# Patient Record
Sex: Male | Born: 1935
Health system: Southern US, Community
[De-identification: ages and names within clinical notes are randomized; demographics above are authoritative.]

## PROBLEM LIST (undated history)

## (undated) ENCOUNTER — Emergency Department (HOSPITAL_COMMUNITY): Admission: EM | Payer: Medicare Other | Source: Home / Self Care

## (undated) DIAGNOSIS — D62 Acute posthemorrhagic anemia: Secondary | ICD-10-CM

## (undated) DIAGNOSIS — Z9289 Personal history of other medical treatment: Secondary | ICD-10-CM

## (undated) DIAGNOSIS — I739 Peripheral vascular disease, unspecified: Secondary | ICD-10-CM

## (undated) DIAGNOSIS — N189 Chronic kidney disease, unspecified: Secondary | ICD-10-CM

## (undated) DIAGNOSIS — I4891 Unspecified atrial fibrillation: Secondary | ICD-10-CM

## (undated) DIAGNOSIS — D638 Anemia in other chronic diseases classified elsewhere: Secondary | ICD-10-CM

## (undated) DIAGNOSIS — I10 Essential (primary) hypertension: Secondary | ICD-10-CM

## (undated) DIAGNOSIS — M8660 Other chronic osteomyelitis, unspecified site: Secondary | ICD-10-CM

## (undated) DIAGNOSIS — M109 Gout, unspecified: Secondary | ICD-10-CM

## (undated) HISTORY — DX: Unspecified atrial fibrillation: I48.91

## (undated) HISTORY — DX: Peripheral vascular disease, unspecified: I73.9

## (undated) HISTORY — DX: Other chronic osteomyelitis, unspecified site: M86.60

## (undated) HISTORY — PX: EYE SURGERY: SHX253

---

## 2013-08-05 ENCOUNTER — Emergency Department (HOSPITAL_COMMUNITY): Payer: Medicare Other

## 2013-08-05 ENCOUNTER — Encounter (HOSPITAL_COMMUNITY): Payer: Self-pay

## 2013-08-05 ENCOUNTER — Inpatient Hospital Stay (HOSPITAL_COMMUNITY)
Admission: EM | Admit: 2013-08-05 | Discharge: 2013-08-11 | DRG: 553 | Disposition: A | Discharge status: Skilled Nursing Facility | Payer: Medicare Other | Attending: Internal Medicine | Admitting: Internal Medicine

## 2013-08-05 DIAGNOSIS — M109 Gout, unspecified: Principal | ICD-10-CM | POA: Diagnosis present

## 2013-08-05 DIAGNOSIS — M25432 Effusion, left wrist: Secondary | ICD-10-CM | POA: Diagnosis present

## 2013-08-05 DIAGNOSIS — I10 Essential (primary) hypertension: Secondary | ICD-10-CM | POA: Diagnosis present

## 2013-08-05 DIAGNOSIS — E43 Unspecified severe protein-calorie malnutrition: Secondary | ICD-10-CM | POA: Diagnosis present

## 2013-08-05 DIAGNOSIS — D72829 Elevated white blood cell count, unspecified: Secondary | ICD-10-CM | POA: Diagnosis present

## 2013-08-05 DIAGNOSIS — N179 Acute kidney failure, unspecified: Secondary | ICD-10-CM | POA: Diagnosis present

## 2013-08-05 DIAGNOSIS — M25439 Effusion, unspecified wrist: Secondary | ICD-10-CM

## 2013-08-05 DIAGNOSIS — E876 Hypokalemia: Secondary | ICD-10-CM | POA: Diagnosis not present

## 2013-08-05 DIAGNOSIS — A419 Sepsis, unspecified organism: Secondary | ICD-10-CM | POA: Diagnosis present

## 2013-08-05 HISTORY — DX: Essential (primary) hypertension: I10

## 2013-08-05 LAB — URINALYSIS, ROUTINE W REFLEX MICROSCOPIC
Leukocytes, UA: NEGATIVE
Nitrite: NEGATIVE
Specific Gravity, Urine: 1.023 (ref 1.005–1.030)
Urobilinogen, UA: 1 mg/dL (ref 0.0–1.0)
pH: 5 (ref 5.0–8.0)

## 2013-08-05 LAB — BASIC METABOLIC PANEL
BUN: 46 mg/dL — ABNORMAL HIGH (ref 6–23)
Chloride: 96 mEq/L (ref 96–112)
GFR calc Af Amer: 37 mL/min — ABNORMAL LOW (ref >90)
Potassium: 3.3 mEq/L — ABNORMAL LOW (ref 3.5–5.1)
Sodium: 134 mEq/L — ABNORMAL LOW (ref 135–145)

## 2013-08-05 LAB — CG4 I-STAT (LACTIC ACID): Lactic Acid, Venous: 2.67 mmol/L — ABNORMAL HIGH (ref 0.5–2.2)

## 2013-08-05 LAB — CBC WITH DIFFERENTIAL/PLATELET
Eosinophils Relative: 0 % (ref 0–5)
Lymphocytes Relative: 4 % — ABNORMAL LOW (ref 12–46)
Lymphs Abs: 0.9 10*3/uL (ref 0.7–4.0)
MCV: 84.3 fL (ref 78.0–100.0)
Neutro Abs: 19.3 10*3/uL — ABNORMAL HIGH (ref 1.7–7.7)
Platelets: 385 10*3/uL (ref 150–400)
RBC: 4.32 MIL/uL (ref 4.22–5.81)
WBC: 22.3 10*3/uL — ABNORMAL HIGH (ref 4.0–10.5)

## 2013-08-05 LAB — URINE MICROSCOPIC-ADD ON

## 2013-08-05 LAB — SEDIMENTATION RATE: Sed Rate: 120 mm/hr — ABNORMAL HIGH (ref 0–16)

## 2013-08-05 LAB — URIC ACID: Uric Acid, Serum: 10.6 mg/dL — ABNORMAL HIGH (ref 4.0–7.8)

## 2013-08-05 MED ORDER — SODIUM CHLORIDE 0.9 % IJ SOLN
3.0000 mL | Freq: Two times a day (BID) | INTRAMUSCULAR | Status: DC
  Administered 2013-08-07: 23:00:00 3 mL via INTRAVENOUS

## 2013-08-05 MED ORDER — VANCOMYCIN HCL IN DEXTROSE 1-5 GM/200ML-% IV SOLN
1000.0000 mg | INTRAVENOUS | Status: DC
  Administered 2013-08-05: 21:00:00 1000 mg via INTRAVENOUS
  Filled 2013-08-05: qty 200

## 2013-08-05 MED ORDER — HEPARIN SODIUM (PORCINE) 5000 UNIT/ML IJ SOLN
5000.0000 [IU] | Freq: Three times a day (TID) | INTRAMUSCULAR | Status: DC
  Filled 2013-08-05 (×2): qty 1

## 2013-08-05 MED ORDER — SODIUM CHLORIDE 0.9 % IV SOLN
INTRAVENOUS | Status: DC
  Administered 2013-08-05: 17:00:00 via INTRAVENOUS

## 2013-08-05 MED ORDER — POTASSIUM CHLORIDE CRYS ER 20 MEQ PO TBCR
30.0000 meq | EXTENDED_RELEASE_TABLET | Freq: Once | ORAL | Status: AC
  Administered 2013-08-05: 21:00:00 30 meq via ORAL
  Filled 2013-08-05: qty 1

## 2013-08-05 MED ORDER — SODIUM CHLORIDE 0.9 % IV SOLN
INTRAVENOUS | Status: DC
  Administered 2013-08-06 – 2013-08-09 (×8): via INTRAVENOUS
  Administered 2013-08-10: 30 mL/h via INTRAVENOUS

## 2013-08-05 NOTE — Progress Notes (Signed)
EDCM spoke to patient and family at bedside.  As per patient's family member, the patient's pcp is Dr. Zara Council. on [address].  Patient reports he was just in the doctor's office last week and had blood work done.  Offered support to patient and family.  No further needs at this time.

## 2013-08-05 NOTE — ED Notes (Signed)
Bed: WA23 Expected date:  Expected time:  Means of arrival:  Comments: ems 

## 2013-08-05 NOTE — ED Provider Notes (Signed)
CSN: [id number]     Arrival date & time 08/05/13  1441 History   First MD Initiated Contact with Patient 08/05/13 [phone]     Chief Complaint  Patient presents with  . Wrist Pain   (Consider location/radiation/quality/duration/timing/severity/associated sxs/prior Treatment) Patient is a 79 y.o. male presenting with wrist pain. The history is provided by the patient. No language interpreter was used.  Wrist Pain This is a new problem. The current episode started in the past 7 days. The problem occurs constantly. The problem has been gradually improving. Associated symptoms include arthralgias and joint swelling. The symptoms are aggravated by exertion. He has tried nothing for the symptoms.  Patient noted pain and swelling in left wrist, onset one week ago.  Patient reports he is unable to use left hand to assist with standing from chair d/t pain.  Patient is left hand dominant. Patient normally very active at home.  Has been staying in the bed for the last week.  Reports intermittent fever.  Denies chest pain, shortness of breath, abdominal pain.  Mild lower back pain.  Ambulatory today.  No past medical history on file. No past surgical history on file. No family history on file. History  Substance Use Topics  . Smoking status: Not on file  . Smokeless tobacco: Not on file  . Alcohol Use: Not on file    Review of Systems  Musculoskeletal: Positive for back pain, joint swelling and arthralgias.  All other systems reviewed and are negative.    Allergies  Review of patient's allergies indicates no known allergies.  Home Medications   Current Outpatient Rx  Name  Route  Sig  Dispense  Refill  . aspirin 81 MG tablet   Oral   Take 81 mg by mouth daily.         . hydrochlorothiazide (HYDRODIURIL) 25 MG tablet   Oral   Take 25 mg by mouth daily.         Marland Kitchen lisinopril (PRINIVIL,ZESTRIL) 20 MG tablet   Oral   Take 20 mg by mouth 2 (two) times daily.         Marland Kitchen POTASSIUM PO  Oral   Take 1 tablet by mouth daily. OTC Potassium          BP 142/105  Pulse 77  Temp(Src) 99.5 F (37.5 C) (Oral)  Resp 23  SpO2 95% Physical Exam  Nursing note and vitals reviewed. Constitutional: He is oriented to person, place, and time. He appears well-developed.  HENT:  Head: Normocephalic and atraumatic.  Neck: Normal range of motion. Neck supple.  Cardiovascular: Intact distal pulses.   Pulmonary/Chest: Effort normal and breath sounds normal.  Abdominal: Soft. Bowel sounds are normal.  Musculoskeletal: He exhibits edema and tenderness.       Left wrist: He exhibits tenderness and swelling.  Lymphadenopathy:    He has no cervical adenopathy.  Neurological: He is alert and oriented to person, place, and time.  Skin: Skin is warm and dry.  Psychiatric: He has a normal mood and affect. His behavior is normal. Judgment and thought content normal.    ED Course  Procedures (including critical care time) Labs Review Labs Reviewed  CBC WITH DIFFERENTIAL  URIC ACID  SEDIMENTATION RATE  BASIC METABOLIC PANEL   Imaging Review No results found. Low grade fever, leukocytosis with increased neutrophils, renal insufficiency, tachycardia, elevated lactic acid in elderly patient. Normal chest xray, urine without indication of infection.  Question hand/wrist as infectious source. Admitted to hospitalist. MDM  Sepsis.    Jimmye Norman, NP 08/06/13 [phone]

## 2013-08-05 NOTE — Progress Notes (Signed)
Utilization Review completed.  Myleen Brailsford RN CM  

## 2013-08-05 NOTE — Progress Notes (Signed)
ANTIBIOTIC CONSULT NOTE - INITIAL  Pharmacy Consult for Vancomycin Indication: left wrist swelling  No Known Allergies  Patient Measurements: Height: 5\' 11"  (180.3 cm) Weight: 172 lb 9.9 oz (78.3 kg) IBW/kg (Calculated) : 75.3   Vital Signs: Temp: 99.2 F (37.3 C) (09/16 1904) Temp src: Oral (09/16 1904) BP: 145/80 mmHg (09/16 1904) Pulse Rate: 108 (09/16 1904) Intake/Output from previous day:   Intake/Output from this shift:    Labs:  Recent Labs  08/05/13 1535  WBC 22.3*  HGB 12.7*  PLT 385  CREATININE 1.84*   Estimated Creatinine Clearance: 30.7 ml/min (by C-G formula based on Cr of 1.84). No results found for this basename: VANCOTROUGH, VANCOPEAK, VANCORANDOM, GENTTROUGH, GENTPEAK, GENTRANDOM, TOBRATROUGH, TOBRAPEAK, TOBRARND, AMIKACINPEAK, AMIKACINTROU, AMIKACIN,  in the last 72 hours   Microbiology: No results found for this or any previous visit (from the past 720 hour(s)).  Medical History: Past Medical History  Diagnosis Date  . Hypertension      Assessment: Vincent Miranda presenting with left wrist pain with joint swelling.  Reports intermittent fever.  Beginning vancomycin for possible septic arthritis.  WBC 22.3  Current temperature 99.5  AKI with SCr 1.84, CrCl~29 ml/min/1.41m2 (normalized), CrCl~30 ml/min (CG)  Blood cultures sent.  Goal of Therapy:  Vancomycin trough level 15-20 mcg/ml  Plan:  Vancomycin 1g IV q24h. F/u SCr, trough levels, culture results.  Clance Boll 08/05/2013,7:08 PM

## 2013-08-05 NOTE — H&P (Signed)
[facility] History and Physical  Vincent Miranda ZOX:[id number] DOB: January 16, 1925 DOA: 08/05/2013  Referring physician: Dr. Karma Ganja PCP: No primary provider on file.  Specialists: ortho hand  Chief Complaint: left hand swelling  HPI: Vincent Miranda is a 79 y.o. male has a past medical history significant for HTN, presents to [facility] ED with a chief complaint of left wrist and hand swelling. He states that he has noticed the swelling for the past 1-2 days being the worst today. He has been working in the yard a lot over the weekend but does not recall trauma or injury to his wrist/hand. He is having difficulties flexing his fingers and forming a fist. He is having difficulties flexing his wrist. He endorses mild chills at home and is not sure about fever. Denies chest pain/SOB. He recalls having an episode like this years ago resolved on its own. Denies history of gout. No abdominal complaints. Feels generalized weakness and fatigue.   Review of Systems: as per HPI otherwise negative.   Past Medical History  Diagnosis Date  . Hypertension    History reviewed. No pertinent past surgical history. Social History:  reports that he has never smoked. He has never used smokeless tobacco. He reports that he does not drink alcohol or use illicit drugs.  No Known Allergies  History reviewed. No pertinent family history.  Prior to Admission medications   Medication Sig Start Date End Date Taking? Authorizing Provider  aspirin 81 MG tablet Take 81 mg by mouth daily.   Yes Historical Provider, MD  hydrochlorothiazide (HYDRODIURIL) 25 MG tablet Take 25 mg by mouth daily.   Yes Historical Provider, MD  lisinopril (PRINIVIL,ZESTRIL) 20 MG tablet Take 20 mg by mouth 2 (two) times daily.   Yes Historical Provider, MD  POTASSIUM PO Take 1 tablet by mouth daily. OTC Potassium   Yes Historical Provider, MD   Physical Exam: Filed Vitals:   08/05/13 1700 08/05/13 1730 08/05/13 1800 08/05/13 1830   BP: 108/71 118/71 86/51 118/63  Pulse: 115 112 109 110  Temp:      TempSrc:      Resp: 27 25 24 24   SpO2: 99% 99% 99% 98%     General:  NAD  Eyes: no scleral icterus  ENT: moist oropharynx  Neck: supple, no JVD  Cardiovascular: regular rate without MRG; 2+ peripheral pulses; tachycardic.  Respiratory: CTA biL, good air movement without wheezing, rhonchi or crackled  Abdomen: soft, non tender to palpation, positive bowel sounds, no guarding, no rebound  Skin: no rashes  Musculoskeletal: no peripheral edema; left hand/wrist swollen, erythematous. Decreased ROM due to severe pain. Swollen PIP 2.  Psychiatric: normal mood and affect  Neurologic: non focal  Labs on Admission:  Basic Metabolic Panel:  Recent Labs Lab 08/05/13 1535  NA 134*  K 3.3*  CL 96  CO2 26  GLUCOSE 127*  BUN 46*  CREATININE 1.84*  CALCIUM 11.2*   CBC:  Recent Labs Lab 08/05/13 1535  WBC 22.3*  NEUTROABS 19.3*  HGB 12.7*  HCT 36.4*  MCV 84.3  PLT 385   Radiological Exams on Admission: Dg Chest 2 View  08/05/2013   *RADIOLOGY REPORT*  Clinical Data: Low oxygen saturation  CHEST - 2 VIEW  Comparison: None  Findings: The heart size and mediastinal contours are within normal limits.  Both lungs are clear.  The visualized skeletal structures are unremarkable.  IMPRESSION: No acute cardiopulmonary abnormality.   Original Report Authenticated By: Signa Kell, M.D.  Dg Wrist Complete Left  08/05/2013   CLINICAL DATA:  Pain  EXAM: LEFT WRIST - COMPLETE 3+ VIEW  COMPARISON:  None.  FINDINGS: Frontal, oblique, lateral, and ulnar deviation scaphoid images were obtained. There is no fracture or dislocation. On the frontal view, there is increased distance between the scaphoid and lunate bones, a finding felt to represent a degree of scapholunate disassociation.  There is osteoarthritic change in the scaphoid trapezial joint. No erosive change. There is narrowing of all MCP joints. There is subtle  calcification in the triangular fibrocartilage region.  IMPRESSION: Findings felt to represent a degree of scapholunate disassociation. Suspect injury to the scapholunate ligament. No fracture or dislocation. Areas of osteoarthritic change. There are several calcifications in the triangular fibrocartilage region. Question previous tearing in this area.   Electronically Signed   By: Bretta Bang   On: 08/05/2013 15:56    Assessment/Plan Active Problems:   Swelling of joint of left wrist   AKI (acute kidney injury)   Sepsis    Sepsis due to probable left hand/wrist infection - with significant leukocytosis, tachycardia, elevated lactic acid and low blood pressure. Blood cultures obtained. No other source of infection with negative CXR and negative UA. Empiric Vancomycin; Dr. Merlyn Lot from ortho hand consulted and will evaluate patient. Differential also include gout; elevated uric acid on presentation. Appreciate ortho input. Keep patient NPO until evaluated. AKI - unknown baseline renal function. Hydrate and monitor in am. HTN - borderline low blood  DVT Prophylaxis - SCDs.    Code Status: Full  Family Communication: none  Disposition Plan: inpatient  Time spent: 62  Vincent Shinn M. Elvera Lennox, MD [facility] Pager [phone]  If 7PM-7AM, please contact night-coverage [email or web address] Password [facility] 08/05/2013, 6:54 PM

## 2013-08-05 NOTE — ED Notes (Signed)
Per EMS- Patient c/o left wrist/hand pain. Patient denies any injury and states the pain is worse with movement. Left pedal radial pulse present.

## 2013-08-06 LAB — CBC WITH DIFFERENTIAL/PLATELET
Basophils Absolute: 0 10*3/uL (ref 0.0–0.1)
Eosinophils Relative: 0 % (ref 0–5)
HCT: 34 % — ABNORMAL LOW (ref 39.0–52.0)
Lymphocytes Relative: 6 % — ABNORMAL LOW (ref 12–46)
MCHC: 35 g/dL (ref 30.0–36.0)
MCV: 84.8 fL (ref 78.0–100.0)
Monocytes Absolute: 2.3 10*3/uL — ABNORMAL HIGH (ref 0.1–1.0)
Monocytes Relative: 11 % (ref 3–12)
RDW: 13.7 % (ref 11.5–15.5)
WBC: 20.8 10*3/uL — ABNORMAL HIGH (ref 4.0–10.5)

## 2013-08-06 LAB — BASIC METABOLIC PANEL
BUN: 48 mg/dL — ABNORMAL HIGH (ref 6–23)
CO2: 26 mEq/L (ref 19–32)
Chloride: 100 mEq/L (ref 96–112)
Creatinine, Ser: 1.99 mg/dL — ABNORMAL HIGH (ref 0.50–1.35)

## 2013-08-06 MED ORDER — POTASSIUM CHLORIDE CRYS ER 20 MEQ PO TBCR
40.0000 meq | EXTENDED_RELEASE_TABLET | Freq: Once | ORAL | Status: AC
  Administered 2013-08-06: 12:00:00 40 meq via ORAL
  Filled 2013-08-06: qty 2

## 2013-08-06 MED ORDER — COLCHICINE 0.6 MG PO TABS
0.6000 mg | ORAL_TABLET | Freq: Two times a day (BID) | ORAL | Status: DC
  Administered 2013-08-06 – 2013-08-11 (×11): 0.6 mg via ORAL
  Filled 2013-08-06 (×12): qty 1

## 2013-08-06 MED ORDER — ACETAMINOPHEN 325 MG PO TABS
650.0000 mg | ORAL_TABLET | Freq: Four times a day (QID) | ORAL | Status: DC | PRN
  Administered 2013-08-06: 650 mg via ORAL
  Filled 2013-08-06: qty 2

## 2013-08-06 MED ORDER — VANCOMYCIN HCL IN DEXTROSE 750-5 MG/150ML-% IV SOLN
750.0000 mg | INTRAVENOUS | Status: DC
  Administered 2013-08-06 – 2013-08-07 (×2): 750 mg via INTRAVENOUS
  Filled 2013-08-06 (×3): qty 150

## 2013-08-06 MED ORDER — COLCHICINE 0.6 MG PO TABS
1.2000 mg | ORAL_TABLET | Freq: Once | ORAL | Status: AC
  Administered 2013-08-06: 01:00:00 1.2 mg via ORAL
  Filled 2013-08-06: qty 2

## 2013-08-06 MED ORDER — ENSURE COMPLETE PO LIQD
237.0000 mL | Freq: Three times a day (TID) | ORAL | Status: DC
  Administered 2013-08-06 – 2013-08-09 (×4): 237 mL via ORAL

## 2013-08-06 NOTE — Progress Notes (Signed)
[facility] PROGRESS NOTE  Vincent Miranda:[id number] DOB: 18-Jan-1925 DOA: 08/05/2013 PCP: No primary provider on file.  Assessment/Plan: 1.left hand/wrist pain >>gout vs infection - with significant leukocytosis, tachycardia, elevated lactic acid and low blood pressure.  -continue current abx and follow pending Blood cultures. -appreciate ortho input- Dr Merlyn Lot favors gout as etiology- continue colchicine -continue pain management -continue pain management 2.AKI - unknown baseline renal function. Creatinine trending up Continue Hydration and follow and recheck in am.  3.HTN - stable, follow 4.Hypokalemia- replace k   Code Status: full Family Communication: none Disposition Plan: to home when medically ready   Consultants:  Ortho - Dr Merlyn Lot  Procedures:  none  Antibiotics:  vancomycin  HPI/Subjective: Still with L. Wrist pain but states better   Objective: Filed Vitals:   08/06/13 0514  BP: 153/87  Pulse: 98  Temp: 100.1 F (37.8 C)  Resp: 20    Intake/Output Summary (Last 24 hours) at 08/06/13 1035 Last data filed at 08/06/13 0700  Gross per 24 hour  Intake 1269.17 ml  Output      0 ml  Net 1269.17 ml   Filed Weights   08/05/13 1904  Weight: 78.3 kg (172 lb 9.9 oz)    Exam:  General: alert & oriented x  In NAD Cardiovascular: RRR, nl S1 s2 Respiratory: CTAB Abdomen: soft +BS NT/ND, no masses palpable Extremities: No cyanosis, L wrist swelling/synovitis and tenderness. No erythema    Data Reviewed: Basic Metabolic Panel:  Recent Labs Lab 08/05/13 1535 08/06/13 0413  NA 134* 136  K 3.3* 3.3*  CL 96 100  CO2 26 26  GLUCOSE 127* 144*  BUN 46* 48*  CREATININE 1.84* 1.99*  CALCIUM 11.2* 10.8*   Liver Function Tests: No results found for this basename: AST, ALT, ALKPHOS, BILITOT, PROT, ALBUMIN,  in the last 168 hours No results found for this basename: LIPASE, AMYLASE,  in the last 168 hours No results found for this  basename: AMMONIA,  in the last 168 hours CBC:  Recent Labs Lab 08/05/13 1535 08/06/13 0413  WBC 22.3* 20.8*  NEUTROABS 19.3* 17.2*  HGB 12.7* 11.9*  HCT 36.4* 34.0*  MCV 84.3 84.8  PLT 385 361   Cardiac Enzymes: No results found for this basename: CKTOTAL, CKMB, CKMBINDEX, TROPONINI,  in the last 168 hours BNP (last 3 results) No results found for this basename: PROBNP,  in the last 8760 hours CBG: No results found for this basename: GLUCAP,  in the last 168 hours  No results found for this or any previous visit (from the past 240 hour(s)).   Studies: Dg Chest 2 View  08/05/2013   *RADIOLOGY REPORT*  Clinical Data: Low oxygen saturation  CHEST - 2 VIEW  Comparison: None  Findings: The heart size and mediastinal contours are within normal limits.  Both lungs are clear.  The visualized skeletal structures are unremarkable.  IMPRESSION: No acute cardiopulmonary abnormality.   Original Report Authenticated By: Signa Kell, M.D.   Dg Wrist Complete Left  08/05/2013   CLINICAL DATA:  Pain  EXAM: LEFT WRIST - COMPLETE 3+ VIEW  COMPARISON:  None.  FINDINGS: Frontal, oblique, lateral, and ulnar deviation scaphoid images were obtained. There is no fracture or dislocation. On the frontal view, there is increased distance between the scaphoid and lunate bones, a finding felt to represent a degree of scapholunate disassociation.  There is osteoarthritic change in the scaphoid trapezial joint. No erosive change. There is narrowing of all MCP joints. There  is subtle calcification in the triangular fibrocartilage region.  IMPRESSION: Findings felt to represent a degree of scapholunate disassociation. Suspect injury to the scapholunate ligament. No fracture or dislocation. Areas of osteoarthritic change. There are several calcifications in the triangular fibrocartilage region. Question previous tearing in this area.   Electronically Signed   By: Bretta Bang   On: 08/05/2013 15:56    Scheduled  Meds: . colchicine  0.6 mg Oral BID  . potassium chloride  40 mEq Oral Once  . sodium chloride  3 mL Intravenous Q12H  . vancomycin  1,000 mg Intravenous Q24H   Continuous Infusions: . sodium chloride 100 mL/hr at 08/06/13 0128    Active Problems:   Swelling of joint of left wrist   AKI (acute kidney injury)   Sepsis    Time spent: 35    Seynabou Fults C  [facility] Pager [phone]. If 7PM-7AM, please contact night-coverage at [email or web address], password [facility] 08/06/2013, 10:35 AM  LOS: 1 day

## 2013-08-06 NOTE — Consult Note (Signed)
Vincent Miranda is an 79 y.o. male.   Chief Complaint: left wrist pain HPI: 79 yo male states he has had left wrist and hand pain for ~1 week.  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  No fevers, chills, night sweats.  Feels it has gotten slightly better.  Presented to [facility] and admitted to hospitalist service.  Feels well, but soreness and swelling in left wrist and hand with certain movements.  Started on IV ABX for septic arthritis vs gout.  Past Medical History  Diagnosis Date  . Hypertension     Past Surgical History  Procedure Laterality Date  . Eye surgery      History reviewed. No pertinent family history. Social History:  reports that he has never smoked. He has never used smokeless tobacco. He reports that he does not drink alcohol or use illicit drugs.  Allergies: No Known Allergies  Medications Prior to Admission  Medication Sig Dispense Refill  . aspirin 81 MG tablet Take 81 mg by mouth daily.      . hydrochlorothiazide (HYDRODIURIL) 25 MG tablet Take 25 mg by mouth daily.      Marland Kitchen lisinopril (PRINIVIL,ZESTRIL) 20 MG tablet Take 20 mg by mouth 2 (two) times daily.      Marland Kitchen POTASSIUM PO Take 1 tablet by mouth daily. OTC Potassium        Results for orders placed during the hospital encounter of 08/05/13 (from the past 48 hour(s))  CBC WITH DIFFERENTIAL     Status: Abnormal   Collection Time    08/05/13  3:35 PM      Result Value Range   WBC 22.3 (*) 4.0 - 10.5 K/uL   RBC 4.32  4.22 - 5.81 MIL/uL   Hemoglobin 12.7 (*) 13.0 - 17.0 g/dL   HCT 95.6 (*) 21.3 - 08.6 %   MCV 84.3  78.0 - 100.0 fL   MCH 29.4  26.0 - 34.0 pg   MCHC 34.9  30.0 - 36.0 g/dL   RDW 57.8  46.9 - 62.9 %   Platelets 385  150 - 400 K/uL   Neutrophils Relative % 87 (*) 43 - 77 %   Neutro Abs 19.3 (*) 1.7 - 7.7 K/uL   Lymphocytes Relative 4 (*) 12 - 46 %   Lymphs Abs 0.9  0.7 - 4.0 K/uL   Monocytes Relative 9  3 - 12 %   Monocytes Absolute 2.1 (*) 0.1 - 1.0 K/uL   Eosinophils Relative  0  0 - 5 %   Eosinophils Absolute 0.0  0.0 - 0.7 K/uL   Basophils Relative 0  0 - 1 %   Basophils Absolute 0.0  0.0 - 0.1 K/uL  URIC ACID     Status: Abnormal   Collection Time    08/05/13  3:35 PM      Result Value Range   Uric Acid, Serum 10.6 (*) 4.0 - 7.8 mg/dL  SEDIMENTATION RATE     Status: Abnormal   Collection Time    08/05/13  3:35 PM      Result Value Range   Sed Rate 120 (*) 0 - 16 mm/hr  BASIC METABOLIC PANEL     Status: Abnormal   Collection Time    08/05/13  3:35 PM      Result Value Range   Sodium 134 (*) 135 - 145 mEq/L   Potassium 3.3 (*) 3.5 - 5.1 mEq/L   Chloride 96  96 - 112 mEq/L  CO2 26  19 - 32 mEq/L   Glucose, Bld 127 (*) 70 - 99 mg/dL   BUN 46 (*) 6 - 23 mg/dL   Creatinine, Ser 8.29 (*) 0.50 - 1.35 mg/dL   Calcium 56.2 (*) 8.4 - 10.5 mg/dL   GFR calc non Af Amer 32 (*) >90 mL/min   GFR calc Af Amer 37 (*) >90 mL/min   Comment: (NOTE)     The eGFR has been calculated using the CKD EPI equation.     This calculation has not been validated in all clinical situations.     eGFR's persistently <90 mL/min signify possible Chronic Kidney     Disease.  CG4 I-STAT (LACTIC ACID)     Status: Abnormal   Collection Time    08/05/13  4:35 PM      Result Value Range   Lactic Acid, Venous 2.67 (*) 0.5 - 2.2 mmol/L  URINALYSIS, ROUTINE W REFLEX MICROSCOPIC     Status: Abnormal   Collection Time    08/05/13  5:59 PM      Result Value Range   Color, Urine YELLOW  YELLOW   APPearance CLOUDY (*) CLEAR   Specific Gravity, Urine 1.023  1.005 - 1.030   pH 5.0  5.0 - 8.0   Glucose, UA NEGATIVE  NEGATIVE mg/dL   Hgb urine dipstick NEGATIVE  NEGATIVE   Bilirubin Urine NEGATIVE  NEGATIVE   Ketones, ur NEGATIVE  NEGATIVE mg/dL   Protein, ur 30 (*) NEGATIVE mg/dL   Urobilinogen, UA 1.0  0.0 - 1.0 mg/dL   Nitrite NEGATIVE  NEGATIVE   Leukocytes, UA NEGATIVE  NEGATIVE  URINE MICROSCOPIC-ADD ON     Status: Abnormal   Collection Time    08/05/13  5:59 PM      Result  Value Range   Squamous Epithelial / LPF RARE  RARE   WBC, UA 3-6  <3 WBC/hpf   Bacteria, UA FEW (*) RARE   Casts HYALINE CASTS (*) NEGATIVE    Dg Chest 2 View  08/05/2013   *RADIOLOGY REPORT*  Clinical Data: Low oxygen saturation  CHEST - 2 VIEW  Comparison: None  Findings: The heart size and mediastinal contours are within normal limits.  Both lungs are clear.  The visualized skeletal structures are unremarkable.  IMPRESSION: No acute cardiopulmonary abnormality.   Original Report Authenticated By: Signa Kell, M.D.   Dg Wrist Complete Left  08/05/2013   CLINICAL DATA:  Pain  EXAM: LEFT WRIST - COMPLETE 3+ VIEW  COMPARISON:  None.  FINDINGS: Frontal, oblique, lateral, and ulnar deviation scaphoid images were obtained. There is no fracture or dislocation. On the frontal view, there is increased distance between the scaphoid and lunate bones, a finding felt to represent a degree of scapholunate disassociation.  There is osteoarthritic change in the scaphoid trapezial joint. No erosive change. There is narrowing of all MCP joints. There is subtle calcification in the triangular fibrocartilage region.  IMPRESSION: Findings felt to represent a degree of scapholunate disassociation. Suspect injury to the scapholunate ligament. No fracture or dislocation. Areas of osteoarthritic change. There are several calcifications in the triangular fibrocartilage region. Question previous tearing in this area.   Electronically Signed   By: Bretta Bang   On: 08/05/2013 15:56     A comprehensive review of systems was negative.  Blood pressure 127/81, pulse 96, temperature 99.2 F (37.3 C), temperature source Oral, resp. rate 20, height 5\' 11"  (1.803 m), weight 172 lb 9.9 oz (78.3 kg), SpO2  94.00%.  General appearance: alert, cooperative and appears stated age Head: Normocephalic, without obvious abnormality, atraumatic Neck: supple, symmetrical, trachea midline Extremities: intact sensation and capillary  refill all digits.  +epl/fpl/io.  ttp dorsum of wrist and pip of long and small fingers.  swelling of long and small pip joints and mildly at wrist.  skin discolored but not erythematous.  no proximal streaking.  able to move digits and wrist withing a limited range of motion without pain.  no adenopathy.  no wounds.  no other ttp in either upper extremity. Pulses: 2+ and symmetric Skin: as above Neurologic: Grossly normal Incision/Wound: na  Assessment/Plan Left wrist/hand pain and swelling.  Favor gout over septic arthritis.  Previous history of similar episodes of shorter duration that resolved on their own.  Afebrile.  High uric acid.  Increased WBC seen in both gout and septic joint.  HCTZ use can precipitate gout attacks.  Recommend colcrys and will follow up tomorrow for reassessment.  Discussed case with admitting team and appreciate their assistance.  Indomethacin discussed, but will hold due to decreased renal function.  Skyeler Scalese R 08/06/2013, 12:47 AM

## 2013-08-06 NOTE — Progress Notes (Signed)
Vancomycin Consult  88 yoM presented 9/17 with left wrist pain and joint swelling. Pt reported intermittent fever. IV Vancomcyin started for r/o septic arthritis. Today is D#2 of therapy.  Ortho on board, favoring gout (HCTZ discontinued, Colcrys started).   Tmax: 100.1 WBC: 20.8K, improving Renal: SCr up to 1.99, CG 28, N 27  9/16 blood x 2 >> pending 9/16 urine >> pending  Plan: Given worsened renal fxn, change Vancomycin to 750 mg IV q24h. F/u and hope to discontinue soon.   Geoffry Paradise, PharmD, BCPS Pager: [phone] 11:32 AM Pharmacy #: [phone]

## 2013-08-06 NOTE — Progress Notes (Signed)
INITIAL NUTRITION ASSESSMENT  Pt meets criteria for severe MALNUTRITION in the context of chronic illness as evidenced by <75% estimated energy intake with severe muscle wasting and subcutaneous fat loss in clavicles and temples.  DOCUMENTATION CODES Per approved criteria  -Severe malnutrition in the context of chronic illness   INTERVENTION: - Ensure Complete TID - Educated pt on purine restricted diet for gout and provided handouts of this information.  - Will continue to monitor   NUTRITION DIAGNOSIS: Inadequate oral intake related to chronic poor appetite as evidenced by 25% meal intake.   Goal: Pt to consume >90% of meals/supplements.   Monitor:  Weights, labs, intake  Reason for Assessment: Nutrition risk   79 y.o. male  Admitting Dx: Left hand swelling   ASSESSMENT: Pt with history of HTN, admitted with left wrist and hand swelling for the past 1-2 days.   Met with pt who reports poor appetite for a long time. Doesn't eat meals, just snacks. Drinks Ensure occasionally. Ate only 25% of breakfast. Seen by orthopedics who suspects gout versus septic arthritis, favoring gout with recommendation of purine restricted diet - pt with elevated uric acid. Pt with low potassium, elevated BUN/Cr with low GFR, and slightly elevated calcium.   Nutrition Focused Physical Exam:  Subcutaneous Fat:  Orbital Region: mild/moderate wasting Upper Arm Region: severe wasting Thoracic and Lumbar Region: severe wasting  Muscle:  Temple Region: severe wasting Clavicle Bone Region: severe wasting Clavicle and Acromion Bone Region: severe wasting Scapular Bone Region: NA Dorsal Hand: mild/moderate wasting Patellar Region: NA Anterior Thigh Region: NA Posterior Calf Region: NA  Edema: Swollen hands and feet   Height: Ht Readings from Last 1 Encounters:  08/05/13 5\' 11"  (1.803 m)    Weight: Wt Readings from Last 1 Encounters:  08/05/13 172 lb 9.9 oz (78.3 kg)    Ideal Body  Weight: 172 lb   % Ideal Body Weight: 100%  Wt Readings from Last 10 Encounters:  08/05/13 172 lb 9.9 oz (78.3 kg)    Usual Body Weight: Pt unsure    BMI:  Body mass index is 24.09 kg/(m^2).  Estimated Nutritional Needs: Kcal: 1950-2050 Protein: 80-95g Fluid: 1.9-2L/day  Skin: Swollen hands and feet  Diet Order: General  EDUCATION NEEDS: -Education needs addressed - discussed diet therapy for gout and provided handouts of this information   Intake/Output Summary (Last 24 hours) at 08/06/13 1339 Last data filed at 08/06/13 0900  Gross per 24 hour  Intake 1509.17 ml  Output      0 ml  Net 1509.17 ml    Last BM: PTA   Labs:   Recent Labs Lab 08/05/13 1535 08/06/13 0413  NA 134* 136  K 3.3* 3.3*  CL 96 100  CO2 26 26  BUN 46* 48*  CREATININE 1.84* 1.99*  CALCIUM 11.2* 10.8*  GLUCOSE 127* 144*    CBG (last 3)  No results found for this basename: GLUCAP,  in the last 72 hours  Scheduled Meds: . colchicine  0.6 mg Oral BID  . sodium chloride  3 mL Intravenous Q12H  . vancomycin  750 mg Intravenous Q24H    Continuous Infusions: . sodium chloride 100 mL/hr at 08/06/13 0128    Past Medical History  Diagnosis Date  . Hypertension     Past Surgical History  Procedure Laterality Date  . Eye surgery      Levon Hedger MS, RD, LDN [phone] Pager [phone] After Hours Pager

## 2013-08-06 NOTE — Progress Notes (Signed)
Subjective:     Patient reports pain as mild.  States he feels a little improved regarding his hand/wrist.  Objective: Vital signs in last 24 hours: Temp:  [99.2 F (37.3 C)-100.1 F (37.8 C)] 100.1 F (37.8 C) (09/17 0514) Pulse Rate:  [77-132] 98 (09/17 0514) Resp:  [16-27] 20 (09/17 0514) BP: (86-153)/(51-106) 153/87 mmHg (09/17 0514) SpO2:  [88 %-100 %] 94 % (09/17 0514) Weight:  [172 lb 9.9 oz (78.3 kg)] 172 lb 9.9 oz (78.3 kg) (09/16 1904)  Intake/Output from previous day: 09/16 0701 - 09/17 0700 In: 1269.2 [I.V.:1069.2; IV Piggyback:200] Out: -  Intake/Output this shift: Total I/O In: 240 [P.O.:240] Out: -    Recent Labs  08/05/13 1535 08/06/13 0413  HGB 12.7* 11.9*    Recent Labs  08/05/13 1535 08/06/13 0413  WBC 22.3* 20.8*  RBC 4.32 4.01*  HCT 36.4* 34.0*  PLT 385 361    Recent Labs  08/05/13 1535 08/06/13 0413  NA 134* 136  K 3.3* 3.3*  CL 96 100  CO2 26 26  BUN 46* 48*  CREATININE 1.84* 1.99*  GLUCOSE 127* 144*  CALCIUM 11.2* 10.8*   No results found for this basename: LABPT, INR,  in the last 72 hours  intact sensation and capillary refill all digits.  +epl/fpl/io.  moving digits and wrist better today.  less tender.  discoloration at wrist slightly improved.  no erythema or streaking.  Assessment/Plan:     Gout vs septic arthritis.  Favor gout as etiology of wrist/hand pain.  Seems somewhat improved this afternoon.  WBC decreased.  Recommend continued colcrys therapy and purine restricted diet as ordered by primary service.  Will follow.  Tylee Yum R 08/06/2013, 12:25 PM

## 2013-08-07 DIAGNOSIS — M109 Gout, unspecified: Principal | ICD-10-CM

## 2013-08-07 DIAGNOSIS — E43 Unspecified severe protein-calorie malnutrition: Secondary | ICD-10-CM | POA: Insufficient documentation

## 2013-08-07 LAB — BASIC METABOLIC PANEL
BUN: 39 mg/dL — ABNORMAL HIGH (ref 6–23)
Chloride: 106 mEq/L (ref 96–112)
Creatinine, Ser: 1.78 mg/dL — ABNORMAL HIGH (ref 0.50–1.35)
GFR calc non Af Amer: 33 mL/min — ABNORMAL LOW (ref >90)
Glucose, Bld: 111 mg/dL — ABNORMAL HIGH (ref 70–99)

## 2013-08-07 LAB — CBC
Hemoglobin: 10.3 g/dL — ABNORMAL LOW (ref 13.0–17.0)
Platelets: 308 10*3/uL (ref 150–400)
RBC: 3.56 MIL/uL — ABNORMAL LOW (ref 4.22–5.81)
WBC: 11.4 10*3/uL — ABNORMAL HIGH (ref 4.0–10.5)

## 2013-08-07 LAB — URINE CULTURE: Colony Count: NO GROWTH

## 2013-08-07 NOTE — Progress Notes (Signed)
Subjective:     Patient reports pain as mild.  States wrist and hand feel pretty good.  No complaints.  Objective: Vital signs in last 24 hours: Temp:  [98.2 F (36.8 C)-101.9 F (38.8 C)] 98.2 F (36.8 C) (09/18 0534) Pulse Rate:  [80-99] 80 (09/18 0534) Resp:  [20-30] 24 (09/18 0534) BP: (149-165)/(75-81) 154/76 mmHg (09/18 0534) SpO2:  [94 %-100 %] 95 % (09/18 0534)  Intake/Output from previous day: 09/17 0701 - 09/18 0700 In: 1791.7 [P.O.:360; I.V.:1281.7; IV Piggyback:150] Out: 1525 [Urine:1525] Intake/Output this shift: Total I/O In: 120 [P.O.:120] Out: 300 [Urine:300]   Recent Labs  08/05/13 1535 08/06/13 0413 08/07/13 0420  HGB 12.7* 11.9* 10.3*    Recent Labs  08/06/13 0413 08/07/13 0420  WBC 20.8* 11.4*  RBC 4.01* 3.56*  HCT 34.0* 30.5*  PLT 361 308    Recent Labs  08/06/13 0413 08/07/13 0420  NA 136 139  K 3.3* 3.5  CL 100 106  CO2 26 25  BUN 48* 39*  CREATININE 1.99* 1.78*  GLUCOSE 144* 111*  CALCIUM 10.8* 9.9   No results found for this basename: LABPT, INR,  in the last 72 hours  intact sensation and capillary refill all digits.  discoloration on wrist improved.  no erythema.  no pain on palpation of digits or wrist.  motion improved.  no streaks  Assessment/Plan:     Improving.  Continue colcrys.    Vincent Miranda R 08/07/2013, 12:25 PM

## 2013-08-07 NOTE — Progress Notes (Addendum)
[facility] PROGRESS NOTE  Vincent Miranda ZOX:[id number] DOB: 18-Jan-1925 DOA: 08/05/2013 PCP: No primary provider on file.  Assessment/Plan: 1.left hand/wrist pain >>gout vs infection - with significant leukocytosis, tachycardia, elevated lactic acid and low blood pressure.  -continue current abx and follow Blood cultures so far no growth although patient still with fevers. -Leukocytosis -11.4 today, follow  -appreciate ortho input- Dr Merlyn Lot favors gout as etiology- continue colchicine -continue pain management -Consult PT OT and follow 2.AKI - unknown baseline renal function. Creatinine trending down with increased Hydration and follow and recheck in am.  3.HTN - stable, follow 4.Hypokalemia- resolved   Code Status: full Family Communication: none Disposition Plan: to home when medically ready   Consultants:  Ortho - Dr Merlyn Lot  Procedures:  none  Antibiotics:  vancomycin  HPI/Subjective: States L. Wrist pain about the same  Objective: Filed Vitals:   08/07/13 1500  BP: 153/83  Pulse: 93  Temp: 98.2 F (36.8 C)  Resp: 22    Intake/Output Summary (Last 24 hours) at 08/07/13 1759 Last data filed at 08/07/13 1742  Gross per 24 hour  Intake 1791.67 ml  Output   1725 ml  Net  66.67 ml   Filed Weights   08/05/13 1904  Weight: 78.3 kg (172 lb 9.9 oz)    Exam:  General: alert & oriented x  In NAD Cardiovascular: RRR, nl S1 s2 Respiratory: CTAB Abdomen: soft +BS NT/ND, no masses palpable Extremities: No cyanosis, L wrist swelling/synovitis and tenderness present. No erythema    Data Reviewed: Basic Metabolic Panel:  Recent Labs Lab 08/05/13 1535 08/06/13 0413 08/07/13 0420  NA 134* 136 139  K 3.3* 3.3* 3.5  CL 96 100 106  CO2 26 26 25   GLUCOSE 127* 144* 111*  BUN 46* 48* 39*  CREATININE 1.84* 1.99* 1.78*  CALCIUM 11.2* 10.8* 9.9   Liver Function Tests: No results found for this basename: AST, ALT, ALKPHOS, BILITOT, PROT, ALBUMIN,   in the last 168 hours No results found for this basename: LIPASE, AMYLASE,  in the last 168 hours No results found for this basename: AMMONIA,  in the last 168 hours CBC:  Recent Labs Lab 08/05/13 1535 08/06/13 0413 08/07/13 0420  WBC 22.3* 20.8* 11.4*  NEUTROABS 19.3* 17.2*  --   HGB 12.7* 11.9* 10.3*  HCT 36.4* 34.0* 30.5*  MCV 84.3 84.8 85.7  PLT 385 361 308   Cardiac Enzymes: No results found for this basename: CKTOTAL, CKMB, CKMBINDEX, TROPONINI,  in the last 168 hours BNP (last 3 results) No results found for this basename: PROBNP,  in the last 8760 hours CBG: No results found for this basename: GLUCAP,  in the last 168 hours  Recent Results (from the past 240 hour(s))  CULTURE, BLOOD (ROUTINE X 2)     Status: None   Collection Time    08/05/13  4:30 PM      Result Value Range Status   Specimen Description BLOOD LEFT HAND   Final   Special Requests BOTTLES DRAWN AEROBIC AND ANAEROBIC   Final   Culture  Setup Time     Final   Value: 08/06/2013 00:41     Performed at [facility]   Culture     Final   Value:        BLOOD CULTURE RECEIVED NO GROWTH TO DATE CULTURE WILL BE HELD FOR 5 DAYS BEFORE ISSUING A FINAL NEGATIVE REPORT     Performed at [facility]   Report  Status PENDING   Incomplete  CULTURE, BLOOD (ROUTINE X 2)     Status: None   Collection Time    08/05/13  4:35 PM      Result Value Range Status   Specimen Description BLOOD RIGHT ANTECUBITAL   Final   Special Requests BOTTLES DRAWN AEROBIC AND ANAEROBIC   Final   Culture  Setup Time     Final   Value: 08/06/2013 00:43     Performed at [facility]   Culture     Final   Value:        BLOOD CULTURE RECEIVED NO GROWTH TO DATE CULTURE WILL BE HELD FOR 5 DAYS BEFORE ISSUING A FINAL NEGATIVE REPORT     Performed at [facility]   Report Status PENDING   Incomplete  URINE CULTURE     Status: None   Collection Time    08/05/13  5:59 PM      Result Value Range Status    Specimen Description URINE, CLEAN CATCH   Final   Special Requests NONE   Final   Culture  Setup Time     Final   Value: 08/06/2013 01:15     Performed at [facility] Count     Final   Value: NO GROWTH     Performed at [facility]   Culture     Final   Value: NO GROWTH     Performed at [facility]   Report Status 08/07/2013 FINAL   Final     Studies: No results found.  Scheduled Meds: . colchicine  0.6 mg Oral BID  . feeding supplement  237 mL Oral TID BM  . sodium chloride  3 mL Intravenous Q12H  . vancomycin  750 mg Intravenous Q24H   Continuous Infusions: . sodium chloride 100 mL/hr at 08/07/13 1419    Active Problems:   Swelling of joint of left wrist   AKI (acute kidney injury)   Sepsis   Protein-calorie malnutrition, severe    Time spent: 25    [facility]  [facility] Pager [phone]. If 7PM-7AM, please contact night-coverage at [email or web address], password [facility] 08/07/2013, 5:59 PM  LOS: 2 days

## 2013-08-08 LAB — BASIC METABOLIC PANEL
BUN: 29 mg/dL — ABNORMAL HIGH (ref 6–23)
CO2: 24 mEq/L (ref 19–32)
Chloride: 107 mEq/L (ref 96–112)
Creatinine, Ser: 1.55 mg/dL — ABNORMAL HIGH (ref 0.50–1.35)
GFR calc Af Amer: 45 mL/min — ABNORMAL LOW (ref >90)
Potassium: 3.6 mEq/L (ref 3.5–5.1)

## 2013-08-08 LAB — CBC
HCT: 30.2 % — ABNORMAL LOW (ref 39.0–52.0)
MCV: 85.3 fL (ref 78.0–100.0)
RBC: 3.54 MIL/uL — ABNORMAL LOW (ref 4.22–5.81)
WBC: 9.4 10*3/uL (ref 4.0–10.5)

## 2013-08-08 MED ORDER — LABETALOL HCL 100 MG PO TABS
100.0000 mg | ORAL_TABLET | Freq: Two times a day (BID) | ORAL | Status: DC
  Administered 2013-08-08 – 2013-08-11 (×6): 100 mg via ORAL
  Filled 2013-08-08 (×7): qty 1

## 2013-08-08 MED ORDER — HYDRALAZINE HCL 20 MG/ML IJ SOLN
10.0000 mg | INTRAMUSCULAR | Status: DC | PRN
  Administered 2013-08-11 (×2): 10 mg via INTRAVENOUS
  Filled 2013-08-08 (×2): qty 1

## 2013-08-08 NOTE — Progress Notes (Signed)
Clinical Social Work Department BRIEF PSYCHOSOCIAL ASSESSMENT 08/08/2013  Patient:  Vincent Miranda, Vincent Miranda     Account Number:  [id number]     Admit date:  08/05/2013  Clinical Social Worker:  Orpah Greek  Date/Time:  08/08/2013 04:01 PM  Referred by:  Physician  Date Referred:  08/08/2013 Referred for  SNF Placement   Other Referral:   Interview type:  Patient Other interview type:   and wife at bedside    PSYCHOSOCIAL DATA Living Status:  WIFE Admitted from facility:   Level of care:   Primary support name:  Vincent Miranda (wife) h#: [phone] c#: [phone] Primary support relationship to patient:  SPOUSE Degree of support available:   good    CURRENT CONCERNS Current Concerns  Post-Acute Placement   Other Concerns:    SOCIAL WORK ASSESSMENT / PLAN CSW received consult from PT - note evaluation recommended SNF for patient at discharge.   Assessment/plan status:  Information/Referral to [facility] Other assessment/ plan:   Information/referral to community resources:   CSW completed FL2 and faxed information out to [facility] - provided bed offers to wife at bedside.    PATIENT'S/FAMILY'S RESPONSE TO PLAN OF CARE: Patient & wife seemed agreeable with plan for SNF at discharge. Wife to tour facilities over the weekend & will check back Monday.       Unice Bailey, LCSW [facility] Clinical Social Worker cell #: [phone]

## 2013-08-08 NOTE — Evaluation (Signed)
Occupational Therapy Evaluation Patient Details Name: Vincent Miranda MRN: [id number] DOB: March 06, 1925 Today's Date: 08/08/2013 Time: 4098-1191 OT Time Calculation (min): 47 min  OT Assessment / Plan / Recommendation History of present illness Vincent Miranda is a 79 y.o. male has a past medical history significant for HTN, presents to [facility] ED with a chief complaint of left wrist and hand swelling. He states that he has noticed the swelling for the past 1-2 days being the worst today. He has been working in the yard a lot over the weekend but does not recall trauma or injury to his wrist/hand. He is having difficulties flexing his fingers and forming a fist. He is having difficulties flexing his wrist. He endorses mild chills at home and is not sure about fever. Denies chest pain/SOB. He recalls having an episode like this years ago resolved on its own. Denies history of gout. No abdominal complaints. Feels generalized weakness and fatigue.    Clinical Impression   Pt presents to OT with decreased I with all ADL activity.  Pt will benefit from skilled OT to increase I with ADL activity in order to regain I and return home with wife. Pt needed increased time to perform activity    OT Assessment  Patient needs continued OT Services    Follow Up Recommendations  SNF;Home health OT;Supervision/Assistance - 24 hour;Other (comment) (depending on progress)    Barriers to Discharge   decrease caregiver support- wife and son work     Equipment Recommendations  3 in 1 bedside comode       Frequency  Min 2X/week    Precautions / Restrictions Precautions Precautions: Fall       ADL  Eating/Feeding: Performed;Maximal assistance;Other (comment) (due to edema in hands) Where Assessed - Eating/Feeding: Bed level Grooming: Maximal assistance Where Assessed - Grooming: Unsupported sitting Upper Body Dressing: Maximal assistance Where Assessed - Upper Body Dressing: Unsupported  sitting;Supported sit to stand Lower Body Dressing: +2 Total assistance Lower Body Dressing: Patient Percentage: 50% Where Assessed - Lower Body Dressing: Supported sit to stand Toilet Transfer: Performed;+2 Total assistance Toilet Transfer: Patient Percentage: 50% Toilet Transfer Method: Sit to stand;Stand pivot Acupuncturist: Materials engineer and Hygiene: Performed;+2 Total assistance Toileting - Architect and Hygiene: Patient Percentage: 50% Where Assessed - Glass blower/designer Manipulation and Hygiene: Standing    OT Diagnosis: Generalized weakness  OT Problem List: Decreased strength;Decreased activity tolerance;Decreased safety awareness;Increased edema   OT Goals(Current goals can be found in the care plan section) ADL Goals Pt Will Perform Grooming: with supervision;standing Pt Will Perform Upper Body Dressing: with supervision;sitting Pt Will Perform Lower Body Dressing: with supervision;sit to/from stand Pt Will Transfer to Toilet: with supervision;ambulating;regular height toilet Pt Will Perform Toileting - Clothing Manipulation and hygiene: with supervision;sit to/from stand  Visit Information  Last OT Received On: 08/08/13 Assistance Needed: +2 History of Present Illness: Vincent Miranda is a 79 y.o. male has a past medical history significant for HTN, presents to [facility] ED with a chief complaint of left wrist and hand swelling. He states that he has noticed the swelling for the past 1-2 days being the worst today. He has been working in the yard a lot over the weekend but does not recall trauma or injury to his wrist/hand. He is having difficulties flexing his fingers and forming a fist. He is having difficulties flexing his wrist. He endorses mild chills at home and is not sure about fever. Denies chest  pain/SOB. He recalls having an episode like this years ago resolved on its own. Denies history of gout. No abdominal  complaints. Feels generalized weakness and fatigue.        Prior Functioning     Home Living Family/patient expects to be discharged to:: Private residence Living Arrangements: Spouse/significant other Type of Home: House Home Layout: One level Home Equipment: None Prior Function Level of Independence: Independent Comments: was totally I until 2 weeks ago Communication Communication: No difficulties         Vision/Perception Vision - History Baseline Vision: Wears glasses all the time Patient Visual Report: No change from baseline   Cognition  Cognition Arousal/Alertness: Awake/alert Behavior During Therapy: Flat affect Overall Cognitive Status: Within Functional Limits for tasks assessed    Extremity/Trunk Assessment Upper Extremity Assessment Upper Extremity Assessment: LUE deficits/detail;RUE deficits/detail;Generalized weakness RUE Deficits / Details: Edema noted- but ROM WFL LUE Deficits / Details: L wrist limited- but pain has decreased.  Edema noted which is limiting ROM     Mobility Bed Mobility Bed Mobility: Supine to Sit Supine to Sit: 2: Max assist Transfers Transfers: Sit to Stand;Stand to Sit Sit to Stand: From bed;1: +2 Total assist Sit to Stand: Patient Percentage: 50% Stand to Sit: To toilet;1: +2 Total assist Stand to Sit: Patient Percentage: 50%           End of Session OT - End of Session Activity Tolerance: Patient tolerated treatment well Nurse Communication: Mobility status  GO     Alba Cory 08/08/2013, 10:05 AM

## 2013-08-08 NOTE — Progress Notes (Addendum)
[facility] PROGRESS NOTE  Vincent Miranda ZOX:[id number] DOB: 01-Apr-1925 DOA: 08/05/2013 PCP: No primary provider on file.  Assessment/Plan: 1.left hand/wrist pain >>gout vs infection - with significant leukocytosis on admission, as well as tachycardia, elevated lactic acid and low blood pressure.  - Blood cultures and urine cultures so far no growth -patient has defervesced and per orthopedics more likely gout -Will discontinue vancomycin at this time on monitor  -appreciate ortho input- Dr Merlyn Lot favors gout as etiology as already mentioned above- continue colchicine -continue pain management - PT OT recommending skilled nursing>> social work assisting with placement 2.AKI - unknown baseline renal function. Creatinine much in with  Hydration and follow, will decrease IV fluids  3.HTN - continue monitoring, lisinopril and HCTZ held on admission secondary to #2 4.Hypokalemia- resolved   Code Status: full Family Communication: none Disposition Plan: to home when medically ready   Consultants:  Ortho - Dr Merlyn Lot  Procedures:  none  Antibiotics:  vancomycin  HPI/Subjective: States L. Wrist pain and swelling less today  Objective: Filed Vitals:   08/08/13 1500  BP:   Pulse: 87  Temp: 98.7 F (37.1 C)  Resp: 18    Intake/Output Summary (Last 24 hours) at 08/08/13 1605 Last data filed at 08/08/13 0930  Gross per 24 hour  Intake 3559.99 ml  Output   1075 ml  Net 2484.99 ml   Filed Weights   08/05/13 1904  Weight: 78.3 kg (172 lb 9.9 oz)    Exam:  General: alert & oriented x  In NAD Cardiovascular: RRR, nl S1 s2 Respiratory: CTAB Abdomen: soft +BS NT/ND, no masses palpable Extremities: No cyanosis, L wrist swelling/synovitis and tenderness present. No erythema    Data Reviewed: Basic Metabolic Panel:  Recent Labs Lab 08/05/13 1535 08/06/13 0413 08/07/13 0420 08/08/13 0357  NA 134* 136 139 139  K 3.3* 3.3* 3.5 3.6  CL 96 100 106 107  CO2  26 26 25 24   GLUCOSE 127* 144* 111* 98  BUN 46* 48* 39* 29*  CREATININE 1.84* 1.99* 1.78* 1.55*  CALCIUM 11.2* 10.8* 9.9 9.8   Liver Function Tests: No results found for this basename: AST, ALT, ALKPHOS, BILITOT, PROT, ALBUMIN,  in the last 168 hours No results found for this basename: LIPASE, AMYLASE,  in the last 168 hours No results found for this basename: AMMONIA,  in the last 168 hours CBC:  Recent Labs Lab 08/05/13 1535 08/06/13 0413 08/07/13 0420 08/08/13 0357  WBC 22.3* 20.8* 11.4* 9.4  NEUTROABS 19.3* 17.2*  --   --   HGB 12.7* 11.9* 10.3* 10.2*  HCT 36.4* 34.0* 30.5* 30.2*  MCV 84.3 84.8 85.7 85.3  PLT 385 361 308 333   Cardiac Enzymes: No results found for this basename: CKTOTAL, CKMB, CKMBINDEX, TROPONINI,  in the last 168 hours BNP (last 3 results) No results found for this basename: PROBNP,  in the last 8760 hours CBG: No results found for this basename: GLUCAP,  in the last 168 hours  Recent Results (from the past 240 hour(s))  CULTURE, BLOOD (ROUTINE X 2)     Status: None   Collection Time    08/05/13  4:30 PM      Result Value Range Status   Specimen Description BLOOD LEFT HAND   Final   Special Requests BOTTLES DRAWN AEROBIC AND ANAEROBIC   Final   Culture  Setup Time     Final   Value: 08/06/2013 00:41     Performed at [facility]   Culture     Final   Value:        BLOOD CULTURE RECEIVED NO GROWTH TO DATE CULTURE WILL BE HELD FOR 5 DAYS BEFORE ISSUING A FINAL NEGATIVE REPORT     Performed at [facility]   Report Status PENDING   Incomplete  CULTURE, BLOOD (ROUTINE X 2)     Status: None   Collection Time    08/05/13  4:35 PM      Result Value Range Status   Specimen Description BLOOD RIGHT ANTECUBITAL   Final   Special Requests BOTTLES DRAWN AEROBIC AND ANAEROBIC   Final   Culture  Setup Time     Final   Value: 08/06/2013 00:43     Performed at [facility]   Culture     Final   Value:        BLOOD CULTURE  RECEIVED NO GROWTH TO DATE CULTURE WILL BE HELD FOR 5 DAYS BEFORE ISSUING A FINAL NEGATIVE REPORT     Performed at [facility]   Report Status PENDING   Incomplete  URINE CULTURE     Status: None   Collection Time    08/05/13  5:59 PM      Result Value Range Status   Specimen Description URINE, CLEAN CATCH   Final   Special Requests NONE   Final   Culture  Setup Time     Final   Value: 08/06/2013 01:15     Performed at [facility] Count     Final   Value: NO GROWTH     Performed at [facility]   Culture     Final   Value: NO GROWTH     Performed at [facility]   Report Status 08/07/2013 FINAL   Final     Studies: No results found.  Scheduled Meds: . colchicine  0.6 mg Oral BID  . feeding supplement  237 mL Oral TID BM  . sodium chloride  3 mL Intravenous Q12H   Continuous Infusions: . sodium chloride 100 mL/hr at 08/08/13 1036    Active Problems:   Swelling of joint of left wrist   AKI (acute kidney injury)   Sepsis   Protein-calorie malnutrition, severe    Time spent: 25    [facility] C  [facility] Pager [phone]. If 7PM-7AM, please contact night-coverage at [email or web address], password [facility] 08/08/2013, 4:05 PM  LOS: 3 days

## 2013-08-08 NOTE — Evaluation (Signed)
Physical Therapy Evaluation Patient Details Name: Vincent Miranda MRN: [id number] DOB: 1925-05-17 Today's Date: 08/08/2013 Time: 7829-5621 PT Time Calculation (min): 31 min  PT Assessment / Plan / Recommendation History of Present Illness  Vincent Miranda is a 79 y.o. male has a past medical history significant for HTN, presents to [facility] ED with a chief complaint of left wrist and hand swelling. He states that he has noticed the swelling for the past 1-2 days being the worst today. He has been working in the yard a lot over the weekend but does not recall trauma or injury to his wrist/hand. He is having difficulties flexing his fingers and forming a fist. He is having difficulties flexing his wrist. He endorses mild chills at home and is not sure about fever. Denies chest pain/SOB. He recalls having an episode like this years ago resolved on its own. Denies history of gout. No abdominal complaints. Feels generalized weakness and fatigue.   Clinical Impression  On eval, pt required +2 for safe mobility-only able to ambulate ~10 feet. MAX encouragement from RN and therapist for participation. Pt adamant about being able to walk, however once up pt was very unsteady and deconditioned. High fall risk. Will likely need SNF for ST rehab to improve mobility unless pt participates and progresses well with therapy.     PT Assessment  Patient needs continued PT services    Follow Up Recommendations  SNF    Does the patient have the potential to tolerate intense rehabilitation      Barriers to Discharge        Equipment Recommendations  Rolling walker with 5" wheels    Recommendations for Other Services OT consult   Frequency Min 3X/week    Precautions / Restrictions Precautions Precautions: Fall Precaution Comments: L hand swelling Restrictions Weight Bearing Restrictions: No   Pertinent Vitals/Pain Pt denied pain      Mobility  Bed Mobility Sit to Supine: 4: Min assist;HOB  elevated Details for Bed Mobility Assistance: Increased time and difficulty for pt.  Transfers Transfers: Sit to Stand;Stand to Sit Sit to Stand: From chair/3-in-1;3: Mod assist;With armrests Sit to Stand: Patient Percentage: 50% Stand to Sit: To bed;3: Mod assist Stand to Sit: Patient Percentage: 50% Details for Transfer Assistance: Assist to rise, stabilize, control descent. Pt unsteady with wide BOS Ambulation/Gait Ambulation/Gait Assistance: 1: +2 Total assist Ambulation/Gait: Patient Percentage: 60% Ambulation Distance (Feet): 10 Feet Assistive device: None Ambulation/Gait Assistance Details: Very unsteady. Lob while turning/changing direction requiring Mod assist to prevent fall. Fatigues very easily. Pt declined to ambulate further despite therapist's encouragement Gait Pattern: Wide base of support;Shuffle;Trunk flexed    Exercises     PT Diagnosis: Difficulty walking;Abnormality of gait;Generalized weakness  PT Problem List: Decreased strength;Decreased activity tolerance;Decreased balance;Decreased mobility;Decreased knowledge of use of DME PT Treatment Interventions: DME instruction;Gait training;Functional mobility training;Therapeutic activities;Therapeutic exercise;Patient/family education;Balance training     PT Goals(Current goals can be found in the care plan section) Acute Rehab PT Goals Patient Stated Goal: home  PT Goal Formulation: With patient Time For Goal Achievement: 08/22/13 Potential to Achieve Goals: Good  Visit Information  Last PT Received On: 08/08/13 Assistance Needed: +2 History of Present Illness: Vincent Miranda is a 79 y.o. male has a past medical history significant for HTN, presents to [facility] ED with a chief complaint of left wrist and hand swelling. He states that he has noticed the swelling for the past 1-2 days being the worst today. He has been working  in the yard a lot over the weekend but does not recall trauma or injury to his  wrist/hand. He is having difficulties flexing his fingers and forming a fist. He is having difficulties flexing his wrist. He endorses mild chills at home and is not sure about fever. Denies chest pain/SOB. He recalls having an episode like this years ago resolved on its own. Denies history of gout. No abdominal complaints. Feels generalized weakness and fatigue.        Prior Functioning  Home Living Family/patient expects to be discharged to:: Private residence Living Arrangements: Spouse/significant other Type of Home: House Home Layout: One level Home Equipment: None Prior Function Level of Independence: Independent Comments: was totally I until 2 weeks ago Communication Communication: No difficulties    Cognition  Cognition Arousal/Alertness: Awake/alert Behavior During Therapy: WFL for tasks assessed/performed Overall Cognitive Status: Within Functional Limits for tasks assessed (although pt does not comprehend need for therapy)    Extremity/Trunk Assessment Upper Extremity Assessment Upper Extremity Assessment: Defer to OT evaluation RUE Deficits / Details: Edema noted- but ROM WFL LUE Deficits / Details: L wrist limited- but pain has decreased.  Edema noted which is limiting ROM Lower Extremity Assessment Lower Extremity Assessment: Generalized weakness Cervical / Trunk Assessment Cervical / Trunk Assessment: Kyphotic   Balance Balance Balance Assessed: Yes Static Standing Balance Static Standing - Balance Support: No upper extremity supported Static Standing - Level of Assistance: 3: Mod assist Dynamic Standing Balance Dynamic Standing - Balance Support: No upper extremity supported Dynamic Standing - Level of Assistance: 3: Mod assist  End of Session PT - End of Session Equipment Utilized During Treatment: Gait belt Activity Tolerance: Patient limited by fatigue (Pt self-limiting) Patient left: in bed;with call bell/phone within reach Nurse Communication: Mobility  status  GP     Rebeca Alert, MPT Pager: [phone]

## 2013-08-08 NOTE — ED Provider Notes (Signed)
Medical screening examination/treatment/procedure(s) were performed by non-physician practitioner and as supervising physician I was immediately available for consultation/collaboration.  Ethelda Chick, MD 08/08/13 [phone]

## 2013-08-08 NOTE — Progress Notes (Signed)
Subjective:     Patient reports pain as Minimal.    Objective: Vital signs in last 24 hours: Temp:  [98.2 F (36.8 C)-99 F (37.2 C)] 98.5 F (36.9 C) (09/19 0453) Pulse Rate:  [76-93] 83 (09/19 0453) Resp:  [18-22] 18 (09/19 0453) BP: (152-158)/(80-84) 158/80 mmHg (09/19 0453) SpO2:  [94 %-96 %] 94 % (09/19 0453)  Intake/Output from previous day: 09/18 0701 - 09/19 0700 In: 3680 [P.O.:480; I.V.:3200] Out: 1775 [Urine:1775] Intake/Output this shift:     Recent Labs  08/05/13 1535 08/06/13 0413 08/07/13 0420 08/08/13 0357  HGB 12.7* 11.9* 10.3* 10.2*    Recent Labs  08/07/13 0420 08/08/13 0357  WBC 11.4* 9.4  RBC 3.56* 3.54*  HCT 30.5* 30.2*  PLT 308 333    Recent Labs  08/07/13 0420 08/08/13 0357  NA 139 139  K 3.5 3.6  CL 106 107  CO2 25 24  BUN 39* 29*  CREATININE 1.78* 1.55*  GLUCOSE 111* 98  CALCIUM 9.9 9.8   No results found for this basename: LABPT, INR,  in the last 72 hours  intact sensation and capillary refill all digits.  +epl/fpl/io.  moves digits/wrist without pain.  discoloration in wrist decreased.  no proximal streaking.  swelling in hand/digits remains.  no erythema.  Assessment/Plan:     Left hand/wrist gouty attack.  Encouraged range of motion exercises to digits/wrist.  Continue colcrys.  Omer Puccinelli R 08/08/2013, 12:32 PM

## 2013-08-08 NOTE — Progress Notes (Signed)
Patient with elevated BP 180s, patient denies any distress/pain. Notified Dr. Suanne Marker, working on putting orders in for patient. Will continue to assess patient.

## 2013-08-08 NOTE — Progress Notes (Signed)
Clinical Social Work Department CLINICAL SOCIAL WORK PLACEMENT NOTE 08/08/2013  Patient:  Vincent Miranda, Vincent Miranda  Account Number:  [id number] Admit date:  08/05/2013  Clinical Social Worker:  Orpah Greek  Date/time:  08/08/2013 04:04 PM  Clinical Social Work is seeking post-discharge placement for this patient at the following level of care:   SKILLED NURSING   (*CSW will update this form in Epic as items are completed)   08/08/2013  Patient/family provided with [facility] Department of Clinical Social Work's list of facilities offering this level of care within the geographic area requested by the patient (or if unable, by the patient's family).  08/08/2013  Patient/family informed of their freedom to choose among providers that offer the needed level of care, that participate in Medicare, Medicaid or managed care program needed by the patient, have an available bed and are willing to accept the patient.  08/08/2013  Patient/family informed of [facility]' ownership interest in [facility], as well as of the fact that they are under no obligation to receive care at this facility.  PASARR submitted to EDS on 08/08/2013 PASARR number received from EDS on 08/08/2013  FL2 transmitted to all facilities in geographic area requested by pt/family on  08/08/2013 FL2 transmitted to all facilities within larger geographic area on   Patient informed that his/her managed care company has contracts with or will negotiate with  certain facilities, including the following:     Patient/family informed of bed offers received:  08/08/2013 Patient chooses bed at  Physician recommends and patient chooses bed at    Patient to be transferred to  on   Patient to be transferred to facility by   The following physician request were entered in Epic:   Additional Comments:   Unice Bailey, LCSW [facility] Clinical Social Worker cell #: [phone]

## 2013-08-09 LAB — CLOSTRIDIUM DIFFICILE BY PCR: Toxigenic C. Difficile by PCR: NEGATIVE

## 2013-08-09 MED ORDER — LOPERAMIDE HCL 2 MG PO CAPS
2.0000 mg | ORAL_CAPSULE | ORAL | Status: DC | PRN
  Administered 2013-08-09 – 2013-08-10 (×2): 2 mg via ORAL
  Filled 2013-08-09 (×3): qty 1

## 2013-08-09 NOTE — Progress Notes (Signed)
Physical Therapy Treatment Patient Details Name: RANULFO KALL MRN: [id number] DOB: [birth year] Today's Date: 08/09/2013 Time: 4098-1191 PT Time Calculation (min): 14 min  PT Assessment / Plan / Recommendation  History of Present Illness pt with episode of HTN earlier today   PT Comments   Pt requires much encouragement to participate with PT  Follow Up Recommendations  SNF     Does the patient have the potential to tolerate intense rehabilitation     Barriers to Discharge        Equipment Recommendations  Rolling walker with 5" wheels    Recommendations for Other Services OT consult  Frequency Min 3X/week   Progress towards PT Goals Progress towards PT goals: Progressing toward goals  Plan Current plan remains appropriate    Precautions / Restrictions Precautions Precautions: Fall   Pertinent Vitals/Pain No c/o pain    Mobility  Bed Mobility Bed Mobility: Sit to Supine Supine to Sit: 3: Mod assist;HOB elevated Details for Bed Mobility Assistance: Increased time and difficulty for pt. needs assist bringing legs to edge of bed and needs much encouragement to initiate activity Transfers Transfers: Sit to Stand;Stand to Sit Sit to Stand: From chair/3-in-1;With armrests;3: Mod assist Stand to Sit: To bed;3: Mod assist Details for Transfer Assistance: Assist to rise, stabilize, control descent. Pt unsteady with wide BOS Ambulation/Gait Ambulation/Gait Assistance: 3: Mod assist Ambulation Distance (Feet): 10 Feet Assistive device: None Ambulation/Gait Assistance Details: improvement in steadiness today.  Pt self limits distance he will walk Gait Pattern: Wide base of support;Shuffle;Trunk flexed Gait velocity: decreased General Gait Details: Pt appeared better able to control balance in upright Stairs: No Wheelchair Mobility Wheelchair Mobility: No    Exercises     PT Diagnosis:    PT Problem List:   PT Treatment Interventions:     PT Goals (current goals  can now be found in the care plan section)    Visit Information  Last PT Received On: 08/09/13 Assistance Needed: +2 History of Present Illness: pt with episode of HTN earlier today    Subjective Data      Cognition  Cognition Arousal/Alertness: Awake/alert Behavior During Therapy: WFL for tasks assessed/performed Overall Cognitive Status: Within Functional Limits for tasks assessed (although pt does not comprehend need for therapy)    Balance  Balance Balance Assessed: Yes Static Standing Balance Static Standing - Balance Support: No upper extremity supported Static Standing - Level of Assistance: 4: Min assist Dynamic Standing Balance Dynamic Standing - Balance Support: Bilateral upper extremity supported;During functional activity Dynamic Standing - Level of Assistance: 4: Min assist  End of Session PT - End of Session Activity Tolerance: Patient limited by fatigue (Pt self-limiting) Patient left: with call bell/phone within reach;in chair Nurse Communication: Mobility status   GP    Rosey Bath K. [place], [place] [phone] 08/09/2013, 4:23 PM

## 2013-08-09 NOTE — Progress Notes (Signed)
[facility] PROGRESS NOTE  Vincent Miranda YNW:[id number] DOB: [birth year] DOA: 08/05/2013 PCP: No primary provider on file.  Assessment/Plan: 1.left hand/wrist pain >>gout vs infection - with significant leukocytosis on admission, as well as tachycardia, elevated lactic acid and low blood pressure.  - Blood cultures and urine cultures so far no growth -patient has defervesced and per orthopedics more likely gout -pt afebrile off vancomycin(dc'ed 9/19), will continue to monitor  -appreciate ortho input- Dr Merlyn Lot favors gout as etiology as already mentioned above- continue colchicine -clinically improved, continue pain management - PT OT recommending skilled nursing>> social work assisting with placement 2.AKI - unknown baseline renal function. Creatinine much improved with  Hydration and follow, recheck   3.HTN - continue monitoring, lisinopril and HCTZ held on admission secondary to #2. Labetalol started 9/19 -better BP control today 4.Hypokalemia- resolved 5.Diarrhea- c.diff neg today, st imodium and follow  Code Status: full Family Communication: none Disposition Plan: to home when medically ready   Consultants:  Ortho - Dr Merlyn Lot  Procedures:  none  Antibiotics:  Vancomycin 9/16>> 9/19  HPI/Subjective: States diarrhea last pm, L. Wrist pain less today  Objective: Filed Vitals:   08/09/13 1350  BP: 146/67  Pulse: 65  Temp: 98.7 F (37.1 C)  Resp: 16    Intake/Output Summary (Last 24 hours) at 08/09/13 1734 Last data filed at 08/09/13 1600  Gross per 24 hour  Intake 1237.5 ml  Output   1650 ml  Net -412.5 ml   Filed Weights   08/05/13 1904  Weight: 78.3 kg (172 lb 9.9 oz)    Exam:  General: alert & oriented x  In NAD Cardiovascular: RRR, nl S1 s2 Respiratory: CTAB Abdomen: soft +BS NT/ND, no masses palpable Extremities: No cyanosis, L wrist swelling/synovitis and tenderness present. No erythema    Data Reviewed: Basic Metabolic  Panel:  Recent Labs Lab 08/05/13 1535 08/06/13 0413 08/07/13 0420 08/08/13 0357  NA 134* 136 139 139  K 3.3* 3.3* 3.5 3.6  CL 96 100 106 107  CO2 26 26 25 24   GLUCOSE 127* 144* 111* 98  BUN 46* 48* 39* 29*  CREATININE 1.84* 1.99* 1.78* 1.55*  CALCIUM 11.2* 10.8* 9.9 9.8   Liver Function Tests: No results found for this basename: AST, ALT, ALKPHOS, BILITOT, PROT, ALBUMIN,  in the last 168 hours No results found for this basename: LIPASE, AMYLASE,  in the last 168 hours No results found for this basename: AMMONIA,  in the last 168 hours CBC:  Recent Labs Lab 08/05/13 1535 08/06/13 0413 08/07/13 0420 08/08/13 0357  WBC 22.3* 20.8* 11.4* 9.4  NEUTROABS 19.3* 17.2*  --   --   HGB 12.7* 11.9* 10.3* 10.2*  HCT 36.4* 34.0* 30.5* 30.2*  MCV 84.3 84.8 85.7 85.3  PLT 385 361 308 333   Cardiac Enzymes: No results found for this basename: CKTOTAL, CKMB, CKMBINDEX, TROPONINI,  in the last 168 hours BNP (last 3 results) No results found for this basename: PROBNP,  in the last 8760 hours CBG: No results found for this basename: GLUCAP,  in the last 168 hours  Recent Results (from the past 240 hour(s))  CULTURE, BLOOD (ROUTINE X 2)     Status: None   Collection Time    08/05/13  4:30 PM      Result Value Range Status   Specimen Description BLOOD LEFT HAND   Final   Special Requests BOTTLES DRAWN AEROBIC AND ANAEROBIC   Final   Culture  Setup Time  Final   Value: 08/06/2013 00:41     Performed at [facility]   Culture     Final   Value:        BLOOD CULTURE RECEIVED NO GROWTH TO DATE CULTURE WILL BE HELD FOR 5 DAYS BEFORE ISSUING A FINAL NEGATIVE REPORT     Performed at [facility]   Report Status PENDING   Incomplete  CULTURE, BLOOD (ROUTINE X 2)     Status: None   Collection Time    08/05/13  4:35 PM      Result Value Range Status   Specimen Description BLOOD RIGHT ANTECUBITAL   Final   Special Requests BOTTLES DRAWN AEROBIC AND ANAEROBIC    Final   Culture  Setup Time     Final   Value: 08/06/2013 00:43     Performed at [facility]   Culture     Final   Value:        BLOOD CULTURE RECEIVED NO GROWTH TO DATE CULTURE WILL BE HELD FOR 5 DAYS BEFORE ISSUING A FINAL NEGATIVE REPORT     Performed at [facility]   Report Status PENDING   Incomplete  URINE CULTURE     Status: None   Collection Time    08/05/13  5:59 PM      Result Value Range Status   Specimen Description URINE, CLEAN CATCH   Final   Special Requests NONE   Final   Culture  Setup Time     Final   Value: 08/06/2013 01:15     Performed at [facility] Count     Final   Value: NO GROWTH     Performed at [facility]   Culture     Final   Value: NO GROWTH     Performed at [facility]   Report Status 08/07/2013 FINAL   Final  CLOSTRIDIUM DIFFICILE BY PCR     Status: None   Collection Time    08/09/13  2:11 AM      Result Value Range Status   C difficile by pcr NEGATIVE  NEGATIVE Final   Comment: Performed at [facility]     Studies: No results found.  Scheduled Meds: . colchicine  0.6 mg Oral BID  . feeding supplement  237 mL Oral TID BM  . labetalol  100 mg Oral BID  . sodium chloride  3 mL Intravenous Q12H   Continuous Infusions: . sodium chloride 30 mL/hr at 08/09/13 1433    Active Problems:   Swelling of joint of left wrist   AKI (acute kidney injury)   Sepsis   Protein-calorie malnutrition, severe    Time spent: 35    [facility] C  [facility] Pager [phone]. If 7PM-7AM, please contact night-coverage at [email or web address], password [facility] 08/09/2013, 5:34 PM  LOS: 4 days

## 2013-08-10 LAB — BASIC METABOLIC PANEL
BUN: 24 mg/dL — ABNORMAL HIGH (ref 6–23)
CO2: 24 mEq/L (ref 19–32)
Chloride: 109 mEq/L (ref 96–112)
Creatinine, Ser: 1.42 mg/dL — ABNORMAL HIGH (ref 0.50–1.35)
GFR calc Af Amer: 50 mL/min — ABNORMAL LOW (ref >90)
Potassium: 3.5 mEq/L (ref 3.5–5.1)

## 2013-08-10 NOTE — Progress Notes (Addendum)
[facility] PROGRESS NOTE  Vincent Miranda JXB:[id number] DOB: 11/29/1924 DOA: 08/05/2013 PCP: No primary provider on file.  Assessment/Plan: 1.left hand/wrist pain >>gout vs infection - with significant leukocytosis on admission, as well as tachycardia, elevated lactic acid and low blood pressure.  - Blood cultures and urine cultures so far no growth -patient has defervesced and per orthopedics more likely gout -pt afebrile off vancomycin(dc'ed 9/19), will continue to monitor  -appreciate ortho input- Dr Merlyn Lot favors gout as etiology as already mentioned above- continue colchicine -clinically improved, continue pain management - PT OT recommending skilled nursing>> social work assisting with placement 2.AKI - unknown baseline renal function. Creatinine much improved with  Hydration  3.HTN - continue monitoring, lisinopril and HCTZ held on admission secondary to #2. Continue Labetalol started 9/19 -better BP control  4.Hypokalemia- resolved 5.Diarrhea- c.diff neg 9/20, improved on imodium, follow  Code Status: full Family Communication: Wife and son at bedside Disposition Plan: Likely to SNF when medically stable   Consultants:  Ortho - Dr Merlyn Lot  Procedures:  none  Antibiotics:  Vancomycin 9/16>> 9/19  HPI/Subjective: States diarrhea decreased, L. Wrist pain less today  Objective: Filed Vitals:   08/10/13 1351  BP: 153/69  Pulse: 65  Temp: 98.6 F (37 C)  Resp: 18    Intake/Output Summary (Last 24 hours) at 08/10/13 1813 Last data filed at 08/10/13 1500  Gross per 24 hour  Intake    765 ml  Output   2175 ml  Net  -1410 ml   Filed Weights   08/05/13 1904  Weight: 78.3 kg (172 lb 9.9 oz)    Exam:  General: alert & oriented x  In NAD Cardiovascular: RRR, nl S1 s2 Respiratory: CTAB Abdomen: soft +BS NT/ND, no masses palpable Extremities: No cyanosis, decreased L wrist swelling/synovitis and tenderness . No erythema    Data Reviewed: Basic  Metabolic Panel:  Recent Labs Lab 08/05/13 1535 08/06/13 0413 08/07/13 0420 08/08/13 0357 08/10/13 0525  NA 134* 136 139 139 138  K 3.3* 3.3* 3.5 3.6 3.5  CL 96 100 106 107 109  CO2 26 26 25 24 24   GLUCOSE 127* 144* 111* 98 93  BUN 46* 48* 39* 29* 24*  CREATININE 1.84* 1.99* 1.78* 1.55* 1.42*  CALCIUM 11.2* 10.8* 9.9 9.8 10.2   Liver Function Tests: No results found for this basename: AST, ALT, ALKPHOS, BILITOT, PROT, ALBUMIN,  in the last 168 hours No results found for this basename: LIPASE, AMYLASE,  in the last 168 hours No results found for this basename: AMMONIA,  in the last 168 hours CBC:  Recent Labs Lab 08/05/13 1535 08/06/13 0413 08/07/13 0420 08/08/13 0357  WBC 22.3* 20.8* 11.4* 9.4  NEUTROABS 19.3* 17.2*  --   --   HGB 12.7* 11.9* 10.3* 10.2*  HCT 36.4* 34.0* 30.5* 30.2*  MCV 84.3 84.8 85.7 85.3  PLT 385 361 308 333   Cardiac Enzymes: No results found for this basename: CKTOTAL, CKMB, CKMBINDEX, TROPONINI,  in the last 168 hours BNP (last 3 results) No results found for this basename: PROBNP,  in the last 8760 hours CBG: No results found for this basename: GLUCAP,  in the last 168 hours  Recent Results (from the past 240 hour(s))  CULTURE, BLOOD (ROUTINE X 2)     Status: None   Collection Time    08/05/13  4:30 PM      Result Value Range Status   Specimen Description BLOOD LEFT HAND   Final   Special  Requests BOTTLES DRAWN AEROBIC AND ANAEROBIC   Final   Culture  Setup Time     Final   Value: 08/06/2013 00:41     Performed at [facility]   Culture     Final   Value:        BLOOD CULTURE RECEIVED NO GROWTH TO DATE CULTURE WILL BE HELD FOR 5 DAYS BEFORE ISSUING A FINAL NEGATIVE REPORT     Performed at [facility]   Report Status PENDING   Incomplete  CULTURE, BLOOD (ROUTINE X 2)     Status: None   Collection Time    08/05/13  4:35 PM      Result Value Range Status   Specimen Description BLOOD RIGHT ANTECUBITAL   Final    Special Requests BOTTLES DRAWN AEROBIC AND ANAEROBIC   Final   Culture  Setup Time     Final   Value: 08/06/2013 00:43     Performed at [facility]   Culture     Final   Value:        BLOOD CULTURE RECEIVED NO GROWTH TO DATE CULTURE WILL BE HELD FOR 5 DAYS BEFORE ISSUING A FINAL NEGATIVE REPORT     Performed at [facility]   Report Status PENDING   Incomplete  URINE CULTURE     Status: None   Collection Time    08/05/13  5:59 PM      Result Value Range Status   Specimen Description URINE, CLEAN CATCH   Final   Special Requests NONE   Final   Culture  Setup Time     Final   Value: 08/06/2013 01:15     Performed at [facility] Count     Final   Value: NO GROWTH     Performed at [facility]   Culture     Final   Value: NO GROWTH     Performed at [facility]   Report Status 08/07/2013 FINAL   Final  CLOSTRIDIUM DIFFICILE BY PCR     Status: None   Collection Time    08/09/13  2:11 AM      Result Value Range Status   C difficile by pcr NEGATIVE  NEGATIVE Final   Comment: Performed at [facility]     Studies: No results found.  Scheduled Meds: . colchicine  0.6 mg Oral BID  . feeding supplement  237 mL Oral TID BM  . labetalol  100 mg Oral BID  . sodium chloride  3 mL Intravenous Q12H   Continuous Infusions: . sodium chloride 30 mL/hr at 08/10/13 0700    Active Problems:   Swelling of joint of left wrist   AKI (acute kidney injury)   Sepsis   Protein-calorie malnutrition, severe    Time spent: 25    [facility] C  [facility] Pager [phone]. If 7PM-7AM, please contact night-coverage at [email or web address], password [facility] 08/10/2013, 6:13 PM  LOS: 5 days

## 2013-08-11 MED ORDER — LOPERAMIDE HCL 2 MG PO CAPS: 2.0000 mg | ORAL_CAPSULE | ORAL | Status: DC | PRN

## 2013-08-11 MED ORDER — COLCHICINE 0.6 MG PO TABS: 0.6000 mg | ORAL_TABLET | Freq: Two times a day (BID) | ORAL | Status: DC

## 2013-08-11 MED ORDER — ENSURE COMPLETE PO LIQD: 237.0000 mL | Freq: Three times a day (TID) | ORAL | Status: DC

## 2013-08-11 MED ORDER — LABETALOL HCL 100 MG PO TABS: 200.0000 mg | ORAL_TABLET | Freq: Two times a day (BID) | ORAL | Status: DC

## 2013-08-11 NOTE — Progress Notes (Signed)
Physical Therapy Treatment Patient Details Name: AUSTAN NICHOLL MRN: [id number] DOB: 1925-05-17 Today's Date: 08/11/2013 Time: 4098-1191 PT Time Calculation (min): 20 min  PT Assessment / Plan / Recommendation  History of Present Illness     PT Comments   Assisted pt OOB to amb then positioned in recliner.   Follow Up Recommendations  SNF     Does the patient have the potential to tolerate intense rehabilitation     Barriers to Discharge        Equipment Recommendations       Recommendations for Other Services    Frequency Min 3X/week   Progress towards PT Goals Progress towards PT goals: Progressing toward goals  Plan Current plan remains appropriate    Precautions / Restrictions Precautions Precautions: Fall Restrictions Weight Bearing Restrictions: No    Pertinent Vitals/Pain No c/o pain    Mobility  Bed Mobility Bed Mobility: Supine to Sit Supine to Sit: 4: Min assist Details for Bed Mobility Assistance: increased time and 50% VC's to stay on task and complete.  Transfers Transfers: Sit to Stand;Stand to Sit Sit to Stand: From bed;4: Min assist Stand to Sit: 4: Min assist;To chair/3-in-1 Details for Transfer Assistance: despite VC's to push self up from bed pt pulled up using RW.   Ambulation/Gait Ambulation/Gait Assistance: 3: Mod assist Ambulation Distance (Feet): 10 Feet Assistive device: Rolling walker Ambulation/Gait Assistance Details: Pt more able to grip with L LE so used RW this session. Told pt at begining of session we where there to take him into the hallway to walk, but when he reached near the door pt became agittaed stating "now I'm not walking out there".  Despite several attepmts to redirect, was unable to convince pt so we turned around and walked back to his recli9ner.  Gait Pattern: Wide base of support;Shuffle;Trunk flexed Gait velocity: decreased    PT Goals (current goals can now be found in the care plan section)    Visit  Information  Last PT Received On: 08/11/13 Assistance Needed: +2    Subjective Data      Cognition       Balance     End of Session PT - End of Session Equipment Utilized During Treatment: Gait belt Activity Tolerance:  (pt self limiting)   Felecia Shelling  PTA [facility]  Acute  Rehab Pager      [phone]

## 2013-08-11 NOTE — Discharge Summary (Signed)
Physician Discharge Summary  Vincent Miranda:[id number] DOB: 04/20/1925 DOA: 08/05/2013  PCP: No primary provider on file.  Admit date: 08/05/2013 Discharge date: 08/11/2013  Time spent: >30 minutes  Recommendations for Outpatient Follow-up:  Follow-up Information   Please follow up. (SNF MD in 1-2days)        Discharge Diagnoses:  Active Problems:   Swelling of joint of left wrist   AKI (acute kidney injury)   Sepsis   Protein-calorie malnutrition, severe   Discharge Condition: IMPROVED/stable  Diet recommendation: heart healthy  Filed Weights   08/05/13 1904  Weight: 78.3 kg (172 lb 9.9 oz)    History of present illness:  Vincent Miranda is a 79 y.o. male has a past medical history significant for HTN, presents to [facility] ED with a chief complaint of left wrist and hand swelling. He states that he has noticed the swelling for the past 1-2 days being the worst today. He has been working in the yard a lot over the weekend but does not recall trauma or injury to his wrist/hand. He is having difficulties flexing his fingers and forming a fist. He is having difficulties flexing his wrist. He endorses mild chills at home and is not sure about fever. Denies chest pain/SOB. He recalls having an episode like this years ago resolved on its own. Denies history of gout. No abdominal complaints. Feels generalized weakness and fatigue.    Hospital Course:  1.left hand/wrist pain >> secondary to gout  - As discussed above patient had significant leukocytosis on admission, as well as tachycardia, elevated lactic acid and low blood pressure with an elevated lactic acid level as well of 10.6. The initial impression was that his left hand/wrist pain and swelling was secondary to infection versus gout. On admission he was empirically started on vancomycin after blood cultures were obtained and and Dr. Jill Side was consulted and he was started on colchicine -Blood cultures and urine  cultures all came back neck -patient defervesced and per orthopedics more likely gout  -pt improved clinically on colchicine, and since he defervesced vancomycin was discontinued and he was monitored and has remained afebrile. -appreciate ortho input - Dr Merlyn Lot saw pt and favored gout as etiology as already mentioned above, and recommended to continue colchicine  - PT OT followed patient in hospital recommended skilled nursing>> social work assisting with placement  -Patient's pain is improved at this time he is medically stable for discharge and is to followup with nursing home M.D. outpatient 2.AKI - patient's creatinine on admission was 1.84 ,unknown baseline renal function. He was hydrated with IV fluids and his creatinine improved to 1.42 on discharge. His lisinopril and HCTZ were also discontinued  3.HTN - as discussed above lisinopril and HCTZ held on admission secondary to #2. He was placed on labetalol for blood pressure control and the dose adjusted to 200 twice a day which is to continue upon discharge. 4.Hypokalemia- his potassium was replaced the hospital.resolved  5.Diarrhea- patient had diarrhea in the hospital and C. difficile studies rechecked and came back negative. He was placed on when necessary Imodium with improvement of the diarrhea.   Procedures:  none  Consultations:  Orthopedics, Dr Merlyn Lot  Discharge Exam: Filed Vitals:   08/11/13 1116  BP: 179/89  Pulse: 71  Temp:   Resp:     Exam:  General: alert & oriented x In NAD  Cardiovascular: RRR, nl S1 s2  Respiratory: CTAB  Abdomen: soft +BS NT/ND, no masses palpable  Extremities:  No cyanosis, decreased L wrist swelling/synovitis and tenderness . No erythema   Discharge Instructions  Discharge Orders   Future Orders Complete By Expires   Diet - low sodium heart healthy  As directed    Increase activity slowly  As directed        Medication List    STOP taking these medications        hydrochlorothiazide 25 MG tablet  Commonly known as:  HYDRODIURIL     lisinopril 20 MG tablet  Commonly known as:  PRINIVIL,ZESTRIL     POTASSIUM PO      TAKE these medications       aspirin 81 MG tablet  Take 81 mg by mouth daily.     colchicine 0.6 MG tablet  Take 1 tablet (0.6 mg total) by mouth 2 (two) times daily.     feeding supplement Liqd  Take 237 mLs by mouth 3 (three) times daily between meals.     labetalol 100 MG tablet  Commonly known as:  NORMODYNE  Take 2 tablets (200 mg total) by mouth 2 (two) times daily.     loperamide 2 MG capsule  Commonly known as:  IMODIUM  Take 1 capsule (2 mg total) by mouth as needed for diarrhea or loose stools (give 1 tab after each loose for max of 16mg /day).       No Known Allergies     Follow-up Information   Please follow up. (SNF MD in 1-2days)        The results of significant diagnostics from this hospitalization (including imaging, microbiology, ancillary and laboratory) are listed below for reference.    Significant Diagnostic Studies: Dg Chest 2 View  08/05/2013   *RADIOLOGY REPORT*  Clinical Data: Low oxygen saturation  CHEST - 2 VIEW  Comparison: None  Findings: The heart size and mediastinal contours are within normal limits.  Both lungs are clear.  The visualized skeletal structures are unremarkable.  IMPRESSION: No acute cardiopulmonary abnormality.   Original Report Authenticated By: Signa Kell, M.D.   Dg Wrist Complete Left  08/05/2013   CLINICAL DATA:  Pain  EXAM: LEFT WRIST - COMPLETE 3+ VIEW  COMPARISON:  None.  FINDINGS: Frontal, oblique, lateral, and ulnar deviation scaphoid images were obtained. There is no fracture or dislocation. On the frontal view, there is increased distance between the scaphoid and lunate bones, a finding felt to represent a degree of scapholunate disassociation.  There is osteoarthritic change in the scaphoid trapezial joint. No erosive change. There is narrowing of all MCP  joints. There is subtle calcification in the triangular fibrocartilage region.  IMPRESSION: Findings felt to represent a degree of scapholunate disassociation. Suspect injury to the scapholunate ligament. No fracture or dislocation. Areas of osteoarthritic change. There are several calcifications in the triangular fibrocartilage region. Question previous tearing in this area.   Electronically Signed   By: Bretta Bang   On: 08/05/2013 15:56    Microbiology: Recent Results (from the past 240 hour(s))  CULTURE, BLOOD (ROUTINE X 2)     Status: None   Collection Time    08/05/13  4:30 PM      Result Value Range Status   Specimen Description BLOOD LEFT HAND   Final   Special Requests BOTTLES DRAWN AEROBIC AND ANAEROBIC   Final   Culture  Setup Time     Final   Value: 08/06/2013 00:41     Performed at [facility]  Final   Value:        BLOOD CULTURE RECEIVED NO GROWTH TO DATE CULTURE WILL BE HELD FOR 5 DAYS BEFORE ISSUING A FINAL NEGATIVE REPORT     Performed at [facility]   Report Status PENDING   Incomplete  CULTURE, BLOOD (ROUTINE X 2)     Status: None   Collection Time    08/05/13  4:35 PM      Result Value Range Status   Specimen Description BLOOD RIGHT ANTECUBITAL   Final   Special Requests BOTTLES DRAWN AEROBIC AND ANAEROBIC   Final   Culture  Setup Time     Final   Value: 08/06/2013 00:43     Performed at [facility]   Culture     Final   Value:        BLOOD CULTURE RECEIVED NO GROWTH TO DATE CULTURE WILL BE HELD FOR 5 DAYS BEFORE ISSUING A FINAL NEGATIVE REPORT     Performed at [facility]   Report Status PENDING   Incomplete  URINE CULTURE     Status: None   Collection Time    08/05/13  5:59 PM      Result Value Range Status   Specimen Description URINE, CLEAN CATCH   Final   Special Requests NONE   Final   Culture  Setup Time     Final   Value: 08/06/2013 01:15     Performed at [facility]  Count     Final   Value: NO GROWTH     Performed at [facility]   Culture     Final   Value: NO GROWTH     Performed at [facility]   Report Status 08/07/2013 FINAL   Final  CLOSTRIDIUM DIFFICILE BY PCR     Status: None   Collection Time    08/09/13  2:11 AM      Result Value Range Status   C difficile by pcr NEGATIVE  NEGATIVE Final   Comment: Performed at [facility]     Labs: Basic Metabolic Panel:  Recent Labs Lab 08/05/13 1535 08/06/13 0413 08/07/13 0420 08/08/13 0357 08/10/13 0525  NA 134* 136 139 139 138  K 3.3* 3.3* 3.5 3.6 3.5  CL 96 100 106 107 109  CO2 26 26 25 24 24   GLUCOSE 127* 144* 111* 98 93  BUN 46* 48* 39* 29* 24*  CREATININE 1.84* 1.99* 1.78* 1.55* 1.42*  CALCIUM 11.2* 10.8* 9.9 9.8 10.2   Liver Function Tests: No results found for this basename: AST, ALT, ALKPHOS, BILITOT, PROT, ALBUMIN,  in the last 168 hours No results found for this basename: LIPASE, AMYLASE,  in the last 168 hours No results found for this basename: AMMONIA,  in the last 168 hours CBC:  Recent Labs Lab 08/05/13 1535 08/06/13 0413 08/07/13 0420 08/08/13 0357  WBC 22.3* 20.8* 11.4* 9.4  NEUTROABS 19.3* 17.2*  --   --   HGB 12.7* 11.9* 10.3* 10.2*  HCT 36.4* 34.0* 30.5* 30.2*  MCV 84.3 84.8 85.7 85.3  PLT 385 361 308 333   Cardiac Enzymes: No results found for this basename: CKTOTAL, CKMB, CKMBINDEX, TROPONINI,  in the last 168 hours BNP: BNP (last 3 results) No results found for this basename: PROBNP,  in the last 8760 hours CBG: No results found for this basename: GLUCAP,  in the last 168 hours     Signed:  Kela Millin  [facility] 08/11/2013, 11:48 AM

## 2013-08-11 NOTE — Progress Notes (Signed)
OT Cancellation Note  Patient Details Name: Vincent Miranda MRN: [id number] DOB: [birth year]   Cancelled Treatment:    Reason Eval/Treat Not Completed: Pain limiting ability to participate  Alba Cory 08/11/2013, 11:17 AM

## 2013-08-11 NOTE — Progress Notes (Signed)
Patient is set to discharge to [facility] today. Patient & wife at bedside aware. Discharge packet given to RN, Amil Amen. PTAR scheduled for 2:00 pickup (Service Request Id: [id number]).   Clinical Social Work Department CLINICAL SOCIAL WORK PLACEMENT NOTE 08/11/2013  Patient:  Vincent Miranda, Vincent Miranda  Account Number:  [id number] Admit date:  08/05/2013  Clinical Social Worker:  Orpah Greek  Date/time:  08/08/2013 04:04 PM  Clinical Social Work is seeking post-discharge placement for this patient at the following level of care:   SKILLED NURSING   (*CSW will update this form in Epic as items are completed)   08/08/2013  Patient/family provided with [facility] Department of Clinical Social Work's list of facilities offering this level of care within the geographic area requested by the patient (or if unable, by the patient's family).  08/08/2013  Patient/family informed of their freedom to choose among providers that offer the needed level of care, that participate in Medicare, Medicaid or managed care program needed by the patient, have an available bed and are willing to accept the patient.  08/08/2013  Patient/family informed of [facility]' ownership interest in [facility], as well as of the fact that they are under no obligation to receive care at this facility.  PASARR submitted to EDS on 08/08/2013 PASARR number received from EDS on 08/08/2013  FL2 transmitted to all facilities in geographic area requested by pt/family on  08/08/2013 FL2 transmitted to all facilities within larger geographic area on   Patient informed that his/her managed care company has contracts with or will negotiate with  certain facilities, including the following:     Patient/family informed of bed offers received:  08/08/2013 Patient chooses bed at [facility] Physician recommends and patient chooses bed at    Patient to be transferred to [facility]  on  08/11/2013 Patient to be transferred to facility by PTAR  The following physician request were entered in Epic:   Additional Comments:   Unice Bailey, LCSW [facility] Clinical Social Worker cell #: [phone]

## 2013-08-12 LAB — CULTURE, BLOOD (ROUTINE X 2): Culture: NO GROWTH

## 2013-09-26 ENCOUNTER — Other Ambulatory Visit: Payer: Self-pay | Admitting: Family Medicine

## 2013-09-26 ENCOUNTER — Ambulatory Visit
Admission: RE | Admit: 2013-09-26 | Discharge: 2013-09-26 | Disposition: A | Discharge status: Home / Self Care | Payer: Medicare Other | Source: Ambulatory Visit | Attending: Family Medicine | Admitting: Family Medicine

## 2013-09-26 DIAGNOSIS — M25572 Pain in left ankle and joints of left foot: Secondary | ICD-10-CM

## 2013-10-06 ENCOUNTER — Encounter (HOSPITAL_BASED_OUTPATIENT_CLINIC_OR_DEPARTMENT_OTHER): Payer: Medicare Other

## 2013-10-09 ENCOUNTER — Ambulatory Visit
Admission: RE | Admit: 2013-10-09 | Discharge: 2013-10-09 | Disposition: A | Discharge status: Home / Self Care | Payer: Medicare Other | Source: Ambulatory Visit | Attending: Orthopaedic Surgery | Admitting: Orthopaedic Surgery

## 2013-10-09 ENCOUNTER — Ambulatory Visit: Admission: RE | Admit: 2013-10-09 | Payer: Medicare Other | Source: Ambulatory Visit

## 2013-10-09 ENCOUNTER — Other Ambulatory Visit: Payer: Self-pay | Admitting: Orthopaedic Surgery

## 2013-10-09 DIAGNOSIS — S81802A Unspecified open wound, left lower leg, initial encounter: Secondary | ICD-10-CM

## 2013-10-15 ENCOUNTER — Other Ambulatory Visit: Payer: Medicare Other

## 2013-10-18 ENCOUNTER — Inpatient Hospital Stay (HOSPITAL_COMMUNITY)
Admission: EM | Admit: 2013-10-18 | Discharge: 2013-10-21 | DRG: 580 | Disposition: A | Discharge status: Home / Self Care | Payer: Medicare Other | Attending: Internal Medicine | Admitting: Internal Medicine

## 2013-10-18 ENCOUNTER — Inpatient Hospital Stay (HOSPITAL_COMMUNITY): Payer: Medicare Other

## 2013-10-18 ENCOUNTER — Encounter (HOSPITAL_COMMUNITY): Payer: Self-pay | Admitting: Emergency Medicine

## 2013-10-18 ENCOUNTER — Emergency Department (HOSPITAL_COMMUNITY): Payer: Medicare Other

## 2013-10-18 DIAGNOSIS — D62 Acute posthemorrhagic anemia: Secondary | ICD-10-CM

## 2013-10-18 DIAGNOSIS — M869 Osteomyelitis, unspecified: Secondary | ICD-10-CM

## 2013-10-18 DIAGNOSIS — N183 Chronic kidney disease, stage 3 unspecified: Secondary | ICD-10-CM

## 2013-10-18 DIAGNOSIS — D649 Anemia, unspecified: Secondary | ICD-10-CM

## 2013-10-18 DIAGNOSIS — N189 Chronic kidney disease, unspecified: Secondary | ICD-10-CM

## 2013-10-18 DIAGNOSIS — L97509 Non-pressure chronic ulcer of other part of unspecified foot with unspecified severity: Principal | ICD-10-CM | POA: Diagnosis present

## 2013-10-18 DIAGNOSIS — L0390 Cellulitis, unspecified: Secondary | ICD-10-CM

## 2013-10-18 DIAGNOSIS — Z7982 Long term (current) use of aspirin: Secondary | ICD-10-CM

## 2013-10-18 DIAGNOSIS — A419 Sepsis, unspecified organism: Secondary | ICD-10-CM

## 2013-10-18 DIAGNOSIS — M1A00X1 Idiopathic chronic gout, unspecified site, with tophus (tophi): Secondary | ICD-10-CM | POA: Diagnosis present

## 2013-10-18 DIAGNOSIS — E43 Unspecified severe protein-calorie malnutrition: Secondary | ICD-10-CM

## 2013-10-18 DIAGNOSIS — L02619 Cutaneous abscess of unspecified foot: Secondary | ICD-10-CM | POA: Diagnosis present

## 2013-10-18 DIAGNOSIS — M25432 Effusion, left wrist: Secondary | ICD-10-CM

## 2013-10-18 DIAGNOSIS — I10 Essential (primary) hypertension: Secondary | ICD-10-CM | POA: Diagnosis present

## 2013-10-18 DIAGNOSIS — N289 Disorder of kidney and ureter, unspecified: Secondary | ICD-10-CM

## 2013-10-18 DIAGNOSIS — N179 Acute kidney failure, unspecified: Secondary | ICD-10-CM

## 2013-10-18 DIAGNOSIS — I8390 Asymptomatic varicose veins of unspecified lower extremity: Secondary | ICD-10-CM | POA: Diagnosis present

## 2013-10-18 DIAGNOSIS — L0291 Cutaneous abscess, unspecified: Secondary | ICD-10-CM

## 2013-10-18 DIAGNOSIS — D631 Anemia in chronic kidney disease: Secondary | ICD-10-CM | POA: Diagnosis present

## 2013-10-18 DIAGNOSIS — I129 Hypertensive chronic kidney disease with stage 1 through stage 4 chronic kidney disease, or unspecified chronic kidney disease: Secondary | ICD-10-CM | POA: Diagnosis present

## 2013-10-18 DIAGNOSIS — Z79899 Other long term (current) drug therapy: Secondary | ICD-10-CM

## 2013-10-18 DIAGNOSIS — N509 Disorder of male genital organs, unspecified: Secondary | ICD-10-CM | POA: Diagnosis present

## 2013-10-18 HISTORY — DX: Anemia in other chronic diseases classified elsewhere: D63.8

## 2013-10-18 HISTORY — DX: Chronic kidney disease, unspecified: N18.9

## 2013-10-18 HISTORY — DX: Acute posthemorrhagic anemia: D62

## 2013-10-18 LAB — BASIC METABOLIC PANEL
BUN: 20 mg/dL (ref 6–23)
CO2: 26 mEq/L (ref 19–32)
Calcium: 9.9 mg/dL (ref 8.4–10.5)
Chloride: 101 mEq/L (ref 96–112)
Creatinine, Ser: 1.88 mg/dL — ABNORMAL HIGH (ref 0.50–1.35)
Glucose, Bld: 112 mg/dL — ABNORMAL HIGH (ref 70–99)

## 2013-10-18 LAB — IRON AND TIBC: Iron: 21 ug/dL — ABNORMAL LOW (ref 42–135)

## 2013-10-18 LAB — CBC WITH DIFFERENTIAL/PLATELET
Eosinophils Relative: 4 % (ref 0–5)
HCT: 23.7 % — ABNORMAL LOW (ref 39.0–52.0)
Hemoglobin: 8 g/dL — ABNORMAL LOW (ref 13.0–17.0)
Lymphocytes Relative: 16 % (ref 12–46)
MCV: 83.2 fL (ref 78.0–100.0)
Monocytes Absolute: 0.7 10*3/uL (ref 0.1–1.0)
Monocytes Relative: 9 % (ref 3–12)
Neutro Abs: 5.6 10*3/uL (ref 1.7–7.7)
RDW: 16.3 % — ABNORMAL HIGH (ref 11.5–15.5)
WBC: 7.8 10*3/uL (ref 4.0–10.5)

## 2013-10-18 LAB — SURGICAL PCR SCREEN
MRSA, PCR: NEGATIVE
Staphylococcus aureus: NEGATIVE

## 2013-10-18 LAB — URINALYSIS, ROUTINE W REFLEX MICROSCOPIC
Bilirubin Urine: NEGATIVE
Glucose, UA: NEGATIVE mg/dL
Hgb urine dipstick: NEGATIVE
Ketones, ur: NEGATIVE mg/dL
Leukocytes, UA: NEGATIVE
Protein, ur: NEGATIVE mg/dL
pH: 6.5 (ref 5.0–8.0)

## 2013-10-18 LAB — URIC ACID: Uric Acid, Serum: 8.9 mg/dL — ABNORMAL HIGH (ref 4.0–7.8)

## 2013-10-18 LAB — RETICULOCYTES
RBC.: 2.51 MIL/uL — ABNORMAL LOW (ref 4.22–5.81)
Retic Count, Absolute: 40.2 10*3/uL (ref 19.0–186.0)
Retic Ct Pct: 1.6 % (ref 0.4–3.1)

## 2013-10-18 LAB — FOLATE: Folate: 5.7 ng/mL

## 2013-10-18 MED ORDER — ACETAMINOPHEN 650 MG RE SUPP: 650.0000 mg | Freq: Four times a day (QID) | RECTAL | Status: DC | PRN

## 2013-10-18 MED ORDER — ALLOPURINOL 100 MG PO TABS
100.0000 mg | ORAL_TABLET | Freq: Every day | ORAL | Status: DC
  Administered 2013-10-18: 10:00:00 100 mg via ORAL
  Filled 2013-10-18 (×2): qty 1

## 2013-10-18 MED ORDER — ENSURE COMPLETE PO LIQD: 237.0000 mL | Freq: Three times a day (TID) | ORAL | Status: DC

## 2013-10-18 MED ORDER — ASPIRIN EC 81 MG PO TBEC
81.0000 mg | DELAYED_RELEASE_TABLET | Freq: Every day | ORAL | Status: DC
  Administered 2013-10-18 – 2013-10-21 (×3): 81 mg via ORAL
  Filled 2013-10-18 (×4): qty 1

## 2013-10-18 MED ORDER — POTASSIUM CHLORIDE CRYS ER 20 MEQ PO TBCR
40.0000 meq | EXTENDED_RELEASE_TABLET | Freq: Once | ORAL | Status: AC
  Administered 2013-10-18: 14:00:00 40 meq via ORAL
  Filled 2013-10-18: qty 2

## 2013-10-18 MED ORDER — ACETAMINOPHEN 325 MG PO TABS: 650.0000 mg | ORAL_TABLET | Freq: Four times a day (QID) | ORAL | Status: DC | PRN

## 2013-10-18 MED ORDER — LABETALOL HCL 200 MG PO TABS
200.0000 mg | ORAL_TABLET | Freq: Two times a day (BID) | ORAL | Status: DC
  Administered 2013-10-18 – 2013-10-21 (×6): 200 mg via ORAL
  Filled 2013-10-18 (×9): qty 1

## 2013-10-18 MED ORDER — ONDANSETRON HCL 4 MG PO TABS: 4.0000 mg | ORAL_TABLET | Freq: Four times a day (QID) | ORAL | Status: DC | PRN

## 2013-10-18 MED ORDER — ASPIRIN 81 MG PO TABS: 81.0000 mg | ORAL_TABLET | Freq: Every day | ORAL | Status: DC

## 2013-10-18 MED ORDER — ZOLPIDEM TARTRATE 5 MG PO TABS: 5.0000 mg | ORAL_TABLET | Freq: Every evening | ORAL | Status: DC | PRN

## 2013-10-18 MED ORDER — VANCOMYCIN HCL IN DEXTROSE 1-5 GM/200ML-% IV SOLN
1000.0000 mg | Freq: Once | INTRAVENOUS | Status: AC
  Administered 2013-10-18: 1000 mg via INTRAVENOUS
  Filled 2013-10-18: qty 200

## 2013-10-18 MED ORDER — VANCOMYCIN HCL IN DEXTROSE 1-5 GM/200ML-% IV SOLN
1000.0000 mg | INTRAVENOUS | Status: DC
  Administered 2013-10-19: 09:00:00 1000 mg via INTRAVENOUS
  Filled 2013-10-18: qty 200

## 2013-10-18 MED ORDER — PIPERACILLIN-TAZOBACTAM 3.375 G IVPB
3.3750 g | Freq: Three times a day (TID) | INTRAVENOUS | Status: DC
  Filled 2013-10-18 (×2): qty 50

## 2013-10-18 MED ORDER — ONDANSETRON HCL 4 MG/2ML IJ SOLN: 4.0000 mg | Freq: Four times a day (QID) | INTRAMUSCULAR | Status: DC | PRN

## 2013-10-18 MED ORDER — ENOXAPARIN SODIUM 30 MG/0.3ML ~~LOC~~ SOLN
30.0000 mg | SUBCUTANEOUS | Status: DC
  Administered 2013-10-18 – 2013-10-20 (×2): 30 mg via SUBCUTANEOUS
  Filled 2013-10-18 (×4): qty 0.3

## 2013-10-18 MED ORDER — HYDROMORPHONE HCL PF 1 MG/ML IJ SOLN: 0.5000 mg | INTRAMUSCULAR | Status: DC | PRN

## 2013-10-18 MED ORDER — FUROSEMIDE 40 MG PO TABS
40.0000 mg | ORAL_TABLET | Freq: Every day | ORAL | Status: DC
  Administered 2013-10-18 – 2013-10-21 (×3): 40 mg via ORAL
  Filled 2013-10-18 (×4): qty 1

## 2013-10-18 MED ORDER — SODIUM CHLORIDE 0.9 % IV SOLN
INTRAVENOUS | Status: DC
  Administered 2013-10-18 – 2013-10-20 (×3): via INTRAVENOUS

## 2013-10-18 MED ORDER — OXYCODONE HCL 5 MG PO TABS
5.0000 mg | ORAL_TABLET | ORAL | Status: DC | PRN
  Administered 2013-10-19 – 2013-10-21 (×5): 5 mg via ORAL
  Filled 2013-10-18 (×4): qty 1

## 2013-10-18 MED ORDER — ALUM & MAG HYDROXIDE-SIMETH 200-200-20 MG/5ML PO SUSP: 30.0000 mL | Freq: Four times a day (QID) | ORAL | Status: DC | PRN

## 2013-10-18 MED ORDER — SODIUM CHLORIDE 0.9 % IV BOLUS (SEPSIS)
1000.0000 mL | Freq: Once | INTRAVENOUS | Status: AC
  Administered 2013-10-18: 1000 mL via INTRAVENOUS

## 2013-10-18 MED ORDER — PIPERACILLIN-TAZOBACTAM 3.375 G IVPB
3.3750 g | Freq: Three times a day (TID) | INTRAVENOUS | Status: DC
  Administered 2013-10-18 – 2013-10-21 (×8): 3.375 g via INTRAVENOUS
  Filled 2013-10-18 (×11): qty 50

## 2013-10-18 MED ORDER — PIPERACILLIN-TAZOBACTAM 3.375 G IVPB
3.3750 g | Freq: Once | INTRAVENOUS | Status: AC
  Administered 2013-10-18: 3.375 g via INTRAVENOUS
  Filled 2013-10-18: qty 50

## 2013-10-18 NOTE — ED Notes (Addendum)
Pt presents to ED with c/o foot ulcer and elbow swelling. Pt was referred by PCP to see athletic medicine and prescribed antibiotics. Pt states foot ulcer getting worse. 2cm length/2 cm width wound ulcer noted at laterally to big toe with strong foul. Also there is sacking of fluid noted at right elbow.

## 2013-10-18 NOTE — Progress Notes (Signed)
Patient seen earlier today by my colleague Dr. Lovell Sheehan. Patient seen and examined, data base reviewed. Patient presented with left foot ulcer, MRI and x-ray was consistent with osteomyelitis. Has also testicular pain, ultrasound negative for torsion. Denies history of diabetes, check hemoglobin A1c, has chronic kidney disease. Dr. Lajoyce Corners to evaluate.  Clint Lipps Pager: [phone] 10/18/2013, 12:22 PM

## 2013-10-18 NOTE — Progress Notes (Signed)
UR completed. Cammi Consalvo RN CCM  

## 2013-10-18 NOTE — Consult Note (Signed)
Reason for Consult: Gout osteomyelitis ulceration left foot first metatarsal head Referring Physician: Dr Parks Neptune is an 79 y.o. male.  HPI: Patient is a 79 year old gentleman with history of gout. Previous this year patient was admitted for gouty inflammation of his wrist. This was treated and resolved well. Patient is developed progressive gouty destructive changes of the great toe left foot patient now has a foul-smelling odor exposed bone necrotic tissue and necrotic tendon.  Past Medical History  Diagnosis Date  . Hypertension     Past Surgical History  Procedure Laterality Date  . Eye surgery      History reviewed. No pertinent family history.  Social History:  reports that he has never smoked. He has never used smokeless tobacco. He reports that he does not drink alcohol or use illicit drugs.  Allergies: No Known Allergies  Medications: I have reviewed the patient's current medications.  Results for orders placed during the hospital encounter of 10/18/13 (from the past 48 hour(s))  CBC WITH DIFFERENTIAL     Status: Abnormal   Collection Time    10/18/13  2:49 AM      Result Value Range   WBC 7.8  4.0 - 10.5 K/uL   RBC 2.85 (*) 4.22 - 5.81 MIL/uL   Hemoglobin 8.0 (*) 13.0 - 17.0 g/dL   HCT 09.8 (*) 11.9 - 14.7 %   MCV 83.2  78.0 - 100.0 fL   MCH 28.1  26.0 - 34.0 pg   MCHC 33.8  30.0 - 36.0 g/dL   RDW 82.9 (*) 56.2 - 13.0 %   Platelets 297  150 - 400 K/uL   Neutrophils Relative % 72  43 - 77 %   Neutro Abs 5.6  1.7 - 7.7 K/uL   Lymphocytes Relative 16  12 - 46 %   Lymphs Abs 1.2  0.7 - 4.0 K/uL   Monocytes Relative 9  3 - 12 %   Monocytes Absolute 0.7  0.1 - 1.0 K/uL   Eosinophils Relative 4  0 - 5 %   Eosinophils Absolute 0.3  0.0 - 0.7 K/uL   Basophils Relative 0  0 - 1 %   Basophils Absolute 0.0  0.0 - 0.1 K/uL  BASIC METABOLIC PANEL     Status: Abnormal   Collection Time    10/18/13  2:49 AM      Result Value Range   Sodium 136  135 -  145 mEq/L   Potassium 3.2 (*) 3.5 - 5.1 mEq/L   Chloride 101  96 - 112 mEq/L   CO2 26  19 - 32 mEq/L   Glucose, Bld 112 (*) 70 - 99 mg/dL   BUN 20  6 - 23 mg/dL   Creatinine, Ser 8.65 (*) 0.50 - 1.35 mg/dL   Calcium 9.9  8.4 - 78.4 mg/dL   GFR calc non Af Amer 31 (*) >90 mL/min   GFR calc Af Amer 36 (*) >90 mL/min   Comment: (NOTE)     The eGFR has been calculated using the CKD EPI equation.     This calculation has not been validated in all clinical situations.     eGFR's persistently <90 mL/min signify possible Chronic Kidney     Disease.  CG4 I-STAT (LACTIC ACID)     Status: Abnormal   Collection Time    10/18/13  3:21 AM      Result Value Range   Lactic Acid, Venous 2.52 (*) 0.5 - 2.2 mmol/L  URIC ACID     Status: Abnormal   Collection Time    10/18/13  4:56 AM      Result Value Range   Uric Acid, Serum 8.9 (*) 4.0 - 7.8 mg/dL  RETICULOCYTES     Status: Abnormal   Collection Time    10/18/13  9:40 AM      Result Value Range   Retic Ct Pct 1.6  0.4 - 3.1 %   RBC. 2.51 (*) 4.22 - 5.81 MIL/uL   Retic Count, Manual 40.2  19.0 - 186.0 K/uL    US Scrotum  10/18/2013   CLINICAL DATA:  Right testicular pain  EXAM: SCROTAL ULTRASOUND  DOPPLER ULTRASOUND OF THE TESTICLES  TECHNIQUE: Complete ultrasound examination of the testicles, epididymis, and other scrotal structures was performed. Color and spectral Doppler ultrasound were also utilized to evaluate blood flow to the testicles.  COMPARISON:  None.  FINDINGS: Right testicle  Measurements: 3.9 x 2.1 x 2.2 cm. Mildly heterogeneous echogenicity inferiorly without discrete mass.  Left testicle  Measurements: 3.5 x 1.9 x 3.0 cm. No mass or microlithiasis visualized.  Right epididymis:  Normal in size and appearance.  Left epididymis:  7 x 6 x 5 mm epididymal head cyst.  Hydrocele:  Small bilateral hydroceles.  Varicocele:  Present bilaterally.  Pulsed Doppler interrogation of both testes demonstrates low resistance arterial and venous  waveforms bilaterally.  IMPRESSION: No evidence of testicular torsion.  Small bilateral hydroceles.  Bilateral varicoceles.   Electronically Signed   By: Charline Bills M.D.   On: 10/18/2013 11:38   [place] Art/ven Flow Abd Pelv Doppler  10/18/2013   CLINICAL DATA:  Right testicular pain  EXAM: SCROTAL ULTRASOUND  DOPPLER ULTRASOUND OF THE TESTICLES  TECHNIQUE: Complete ultrasound examination of the testicles, epididymis, and other scrotal structures was performed. Color and spectral Doppler ultrasound were also utilized to evaluate blood flow to the testicles.  COMPARISON:  None.  FINDINGS: Right testicle  Measurements: 3.9 x 2.1 x 2.2 cm. Mildly heterogeneous echogenicity inferiorly without discrete mass.  Left testicle  Measurements: 3.5 x 1.9 x 3.0 cm. No mass or microlithiasis visualized.  Right epididymis:  Normal in size and appearance.  Left epididymis:  7 x 6 x 5 mm epididymal head cyst.  Hydrocele:  Small bilateral hydroceles.  Varicocele:  Present bilaterally.  Pulsed Doppler interrogation of both testes demonstrates low resistance arterial and venous waveforms bilaterally.  IMPRESSION: No evidence of testicular torsion.  Small bilateral hydroceles.  Bilateral varicoceles.   Electronically Signed   By: Charline Bills M.D.   On: 10/18/2013 11:38   Dg Foot Complete Left  10/18/2013   CLINICAL DATA:  Foot ulcer in the internal left foot along the distal 1st metatarsal region.  EXAM: LEFT FOOT - COMPLETE 3+ VIEW  COMPARISON:  09/26/2013 left foot radiographs and MRI left foot 10/09/2013  FINDINGS: There is a large soft tissue defect/ ulceration along the medial aspect of the left foot at the level of the 1st metatarsal head. There is underlying cortical irregularity, cortical destruction, and mixed sclerotic and lucent change in the metatarsal head. As shown on the previous MRI, the changes are worrisome for osteomyelitis. There is been progression of the size of the ulcer since the previous study.  No radiopaque soft tissue foreign bodies. No acute fracture or subluxation.  IMPRESSION: Large soft tissue defect/ ulceration along the medial aspect of the left foot at the 1st metatarsal head with progression since previous study. Underlying bone changes in  the metatarsal head consistent with osteomyelitis as shown on previous MRI.   Electronically Signed   By: Burman Nieves M.D.   On: 10/18/2013 05:35    Review of Systems  All other systems reviewed and are negative.   Blood pressure 128/70, pulse 87, temperature 98.5 F (36.9 C), temperature source Oral, resp. rate 18, height 5\' 10"  (1.778 m), weight 78.699 kg (173 lb 8 oz), SpO2 93.00%. Physical Exam On examination patient is a faint dorsalis pedis pulse of the left foot. He is a foul-smelling odor from the wound which is approximately 3 cm in diameter over the MTP joint of the great toe. There is exposed necrotic bone exposed necrotic tendon and exposed necrotic soft tissue. Radiographs have shown destructive changes to the MTP joint from his chronic gout. Assessment/Plan: Assessment: Chronic gout myelitis abscess ulceration left foot MCP joint.  Plan: Will plan for a left foot first ray amputation. Risks and benefits were discussed including persistent infection need for additional surgery nonhealing of the wound potential for amputation of an additional digit. Patient and family state they understand and wish to proceed at this time. Plan for surgery tomorrow morning approximately 8:30.  Kanasia Gayman V 10/18/2013, 12:27 PM

## 2013-10-18 NOTE — ED Provider Notes (Signed)
CSN: [id number]     Arrival date & time 10/18/13  0044 History   First MD Initiated Contact with Patient 10/18/13 0052     Chief Complaint  Patient presents with  . Foot Ulcer  . Joint Swelling   HPI  History provided by the patient and family. Patient is a 79 year old male with history of hypertension who presents with complaints of worsening left lower foot ulcer. Patient reports having chronic foot ulcer for the past several months. He was being treated with antibiotics which he finished 13 days ago. Patient is following up with a wound specialist and had an MRI procedure performed 7 days ago. Since then he has not had any followup or been told of any results. He reports increasing pain and swelling to the foot with increased drainage from the wound. Swelling has spread up to the ankle and lower leg. He denies any erythematous streaks up the leg. Denies any weakness or numbness to the foot. He does report some associated subjective fevers and chills at home. He has been taking ibuprofen for the pain.  PCP: Dr. Bruna Potter GI: Dr. Odis Luster: ? Appointment with Lajoyce Corners on Monday.  Past Medical History  Diagnosis Date  . Hypertension    Past Surgical History  Procedure Laterality Date  . Eye surgery     History reviewed. No pertinent family history. History  Substance Use Topics  . Smoking status: Never Smoker   . Smokeless tobacco: Never Used  . Alcohol Use: No    Review of Systems  Constitutional: Positive for chills and appetite change. Negative for fever.  Respiratory: Negative for cough.   Cardiovascular: Negative for chest pain.  Gastrointestinal: Positive for nausea. Negative for abdominal pain.  All other systems reviewed and are negative.    Allergies  Review of patient's allergies indicates no known allergies.  Home Medications   Current Outpatient Rx  Name  Route  Sig  Dispense  Refill  . allopurinol (ZYLOPRIM) 100 MG tablet   Oral   Take 100 mg by mouth  daily.         Marland Kitchen aspirin 81 MG tablet   Oral   Take 81 mg by mouth daily.         Marland Kitchen doxycycline (VIBRA-TABS) 100 MG tablet   Oral   Take 100 mg by mouth daily.         . furosemide (LASIX) 40 MG tablet   Oral   Take 40 mg by mouth daily.         Marland Kitchen labetalol (NORMODYNE) 100 MG tablet   Oral   Take 2 tablets (200 mg total) by mouth 2 (two) times daily.         . feeding supplement (ENSURE COMPLETE) LIQD   Oral   Take 237 mLs by mouth 3 (three) times daily between meals.         Marland Kitchen loperamide (IMODIUM) 2 MG capsule   Oral   Take 1 capsule (2 mg total) by mouth as needed for diarrhea or loose stools (give 1 tab after each loose for max of 16mg /day).   30 capsule   0    BP 123/66  Pulse 69  Temp(Src) 97.8 F (36.6 C) (Oral)  Resp 18  SpO2 100% Physical Exam  Nursing note and vitals reviewed. Constitutional: He is oriented to person, place, and time. He appears well-developed and well-nourished. No distress.  HENT:  Head: Normocephalic.  Cardiovascular: Normal rate and regular rhythm.   Pulmonary/Chest: Effort  normal and breath sounds normal. No respiratory distress. He has no wheezes. He has no rales.  Abdominal: Soft. There is no tenderness.  Musculoskeletal:  5 cm circular deep ulcerative wound to the medial aspect of the left foot near the first MTP joint. There is drainage. Diffuse erythema surrounding the wound with swelling.  Swelling over the left olecranon bursa. No significant tenderness. Area is soft and fluctuant. No erythema or induration of the skin. Full range of motion.  Neurological: He is alert and oriented to person, place, and time.    ED Course  Procedures    DIAGNOSTIC STUDIES: Oxygen Saturation is 100% on room air.    COORDINATION OF CARE:  Nursing notes reviewed. Vital signs reviewed. Initial pt interview and examination performed.   2:54 AM-patient seen and evaluated. He is well appearing in no acute distress. Does not appear  severely ill or toxic. Discussed work up plan with pt at bedside, which includes lab testing. Pt agrees with plan.  MRI results reviewed from November 21. There was concerns for osteomyelitis at that time. Patient reports that his pain and swelling the foot have increased. His ammonia is concerning for worsening infection. Pt discussed with Attending Physician.  He agrees with plan for admission.  Patient was also seen and evaluated by attending physician. Patient otherwise appears well however wound does give concern for worsened infection. We'll plan to attempt to consult orthopedic specialist.  4:50 AM spoke with Dr. Lajoyce Corners on call for [facility] orthopedics. He recommends that the patient receive IV antibiotics and be admitted to the hospitalist service. He also recommends adding a uric acid level and plain film x-rays of the foot. He will plan to see patient in the hospital in consultation.  5:20AM Spoke with Dr. Lovell Sheehan with [facility].  She will see pt and admit to med surg bed under team 10.   Results for orders placed during the hospital encounter of 10/18/13  CBC WITH DIFFERENTIAL      Result Value Range   WBC 7.8  4.0 - 10.5 K/uL   RBC 2.85 (*) 4.22 - 5.81 MIL/uL   Hemoglobin 8.0 (*) 13.0 - 17.0 g/dL   HCT 16.1 (*) 09.6 - 04.5 %   MCV 83.2  78.0 - 100.0 fL   MCH 28.1  26.0 - 34.0 pg   MCHC 33.8  30.0 - 36.0 g/dL   RDW 40.9 (*) 81.1 - 91.4 %   Platelets 297  150 - 400 K/uL   Neutrophils Relative % 72  43 - 77 %   Neutro Abs 5.6  1.7 - 7.7 K/uL   Lymphocytes Relative 16  12 - 46 %   Lymphs Abs 1.2  0.7 - 4.0 K/uL   Monocytes Relative 9  3 - 12 %   Monocytes Absolute 0.7  0.1 - 1.0 K/uL   Eosinophils Relative 4  0 - 5 %   Eosinophils Absolute 0.3  0.0 - 0.7 K/uL   Basophils Relative 0  0 - 1 %   Basophils Absolute 0.0  0.0 - 0.1 K/uL  BASIC METABOLIC PANEL      Result Value Range   Sodium 136  135 - 145 mEq/L   Potassium 3.2 (*) 3.5 - 5.1 mEq/L   Chloride 101  96 - 112 mEq/L    CO2 26  19 - 32 mEq/L   Glucose, Bld 112 (*) 70 - 99 mg/dL   BUN 20  6 - 23 mg/dL   Creatinine, Ser 7.82 (*)  0.50 - 1.35 mg/dL   Calcium 9.9  8.4 - 29.5 mg/dL   GFR calc non Af Amer 31 (*) >90 mL/min   GFR calc Af Amer 36 (*) >90 mL/min  URIC ACID      Result Value Range   Uric Acid, Serum 8.9 (*) 4.0 - 7.8 mg/dL  CG4 I-STAT (LACTIC ACID)      Result Value Range   Lactic Acid, Venous 2.52 (*) 0.5 - 2.2 mmol/L       Imaging Review Dg Foot Complete Left  10/18/2013   CLINICAL DATA:  Foot ulcer in the internal left foot along the distal 1st metatarsal region.  EXAM: LEFT FOOT - COMPLETE 3+ VIEW  COMPARISON:  09/26/2013 left foot radiographs and MRI left foot 10/09/2013  FINDINGS: There is a large soft tissue defect/ ulceration along the medial aspect of the left foot at the level of the 1st metatarsal head. There is underlying cortical irregularity, cortical destruction, and mixed sclerotic and lucent change in the metatarsal head. As shown on the previous MRI, the changes are worrisome for osteomyelitis. There is been progression of the size of the ulcer since the previous study. No radiopaque soft tissue foreign bodies. No acute fracture or subluxation.  IMPRESSION: Large soft tissue defect/ ulceration along the medial aspect of the left foot at the 1st metatarsal head with progression since previous study. Underlying bone changes in the metatarsal head consistent with osteomyelitis as shown on previous MRI.   Electronically Signed   By: Burman Nieves M.D.   On: 10/18/2013 05:35     MDM   1. Osteomyelitis        Angus Seller, PA-C 10/18/13 [phone]

## 2013-10-18 NOTE — Progress Notes (Signed)
ANTIBIOTIC CONSULT NOTE - INITIAL  Pharmacy Consult for Vancomycin Indication: osteomyelitis  No Known Allergies  Patient Measurements: Height: 5' 10.87" (180 cm) Weight: 171 lb 15.3 oz (78 kg) IBW/kg (Calculated) : 74.99  Vital Signs: Temp: 97.9 F (36.6 C) (11/29 0334) Temp src: Oral (11/29 0334) BP: 146/65 mmHg (11/29 0600) Pulse Rate: 80 (11/29 0600) Intake/Output from previous day: 11/28 0701 - 11/29 0700 In: 50 [I.V.:50] Out: -  Intake/Output from this shift: Total I/O In: 50 [I.V.:50] Out: -   Labs:  Recent Labs  10/18/13 0249  WBC 7.8  HGB 8.0*  PLT 297  CREATININE 1.88*   Estimated Creatinine Clearance: 29.9 ml/min (by C-G formula based on Cr of 1.88). No results found for this basename: VANCOTROUGH, VANCOPEAK, VANCORANDOM, GENTTROUGH, GENTPEAK, GENTRANDOM, TOBRATROUGH, TOBRAPEAK, TOBRARND, AMIKACINPEAK, AMIKACINTROU, AMIKACIN,  in the last 72 hours   Microbiology: No results found for this or any previous visit (from the past 720 hour(s)).  Medical History: Past Medical History  Diagnosis Date  . Hypertension     Medications:  Prescriptions prior to admission  Medication Sig Dispense Refill  . allopurinol (ZYLOPRIM) 100 MG tablet Take 100 mg by mouth daily.      Marland Kitchen aspirin 81 MG tablet Take 81 mg by mouth daily.      Marland Kitchen doxycycline (VIBRA-TABS) 100 MG tablet Take 100 mg by mouth daily.      . furosemide (LASIX) 40 MG tablet Take 40 mg by mouth daily.      Marland Kitchen labetalol (NORMODYNE) 100 MG tablet Take 2 tablets (200 mg total) by mouth 2 (two) times daily.      . feeding supplement (ENSURE COMPLETE) LIQD Take 237 mLs by mouth 3 (three) times daily between meals.      Marland Kitchen loperamide (IMODIUM) 2 MG capsule Take 1 capsule (2 mg total) by mouth as needed for diarrhea or loose stools (give 1 tab after each loose for max of 16mg /day).  30 capsule  0   Assessment: 79 yo male with left foot osteomyelitis for empiric antibiotics.  Vancomycin 1 g IV given in ED at   0530.  Goal of Therapy:  Vancomycin trough level 15-20 mcg/ml  Plan:  Vancomycin 1 g IV q48h, next dose tomorrow morning.  Eddie Candle 10/18/2013,6:22 AM

## 2013-10-18 NOTE — Progress Notes (Addendum)
Patient admitted to 5w26 from ED. Patient is A&Ox3. Patient oriented to unit and room. Patient has a wound to left medial anterior foot that is covered with greenish colored drainage measuring 5x2x1 cm.  Applied wet to dry dsg to left foot and wrapped with kerliex.  Patient has callus to rt foot that is ota. Patient lives at home with wife. Will continue to monitor patient. Nelda Marseille, RN

## 2013-10-18 NOTE — ED Notes (Signed)
Informed Dr Romeo Apple the Lactic Acid values is 2.52

## 2013-10-18 NOTE — ED Provider Notes (Signed)
Medical screening examination/treatment/procedure(s) were conducted as a shared visit with non-physician practitioner(s) and myself.  I personally evaluated the patient during the encounter.  EKG Interpretation    Date/Time:    Ventricular Rate:    PR Interval:    QRS Duration:   QT Interval:    QTC Calculation:   R Axis:     Text Interpretation:              I interviewed and examined the patient. Lungs are CTAB. Cardiac exam wnl. Abdomen soft.  Foul smelling ulcerated wound to left foot. Pt appears well otherwise. Will discuss w/ ortho and admit.   Junius Argyle, MD 10/18/13 [phone]

## 2013-10-18 NOTE — H&P (Addendum)
[facility] History and Physical  Vincent Miranda YNW:[id number] DOB: [birth year] DOA: 10/18/2013  Referring physician:  EDP PCP: Dr. Clyda Greener Specialists:   Chief Complaint:  Worsening Foot Ulcer  HPI: Vincent Miranda is a 79 y.o. male who presents to the ED with complaints of worsening of the ulcer on his left foot.   He reports that the ulcer began 3-4 weeks ago and he was placed on antibiotics by his PCP but the ulcer continued to worsen.  He has had increased swelling  And pain from the area over thee past week.  He had an MRI done on 11/20 as an outpatient and was referred to see Dr. Lajoyce Corners on  10/20/2013.   He was evaluated in the Ed and placed on IV Vancomycin and Zosyn and referred for medical admission.     Aside from his wound , he reports that he had sudden increased right Testicular pain, PTA, that lasted 1 hour.   He denies having any previous similar pain.      Review of Systems: The patient denies anorexia, fever, chills, headaches, weight loss,, vision loss, diplopia, dizziness, decreased hearing, rhinitis, hoarseness, chest pain, syncope, dyspnea on exertion, peripheral edema, balance deficits, cough, hemoptysis, abdominal pain, nausea, vomiting, diarrhea, constipation, hematemesis, melena, hematochezia, severe indigestion/heartburn, dysuria, hematuria, incontinence, muscle weakness, suspicious skin lesions, transient blindness, difficulty walking, depression, unusual weight change, abnormal bleeding, enlarged lymph nodes, angioedema, and breast masses.    Past Medical History  Diagnosis Date  . Hypertension     Past Surgical History  Procedure Laterality Date  . Eye surgery      Prior to Admission medications   Medication Sig Start Date End Date Taking? Authorizing Provider  allopurinol (ZYLOPRIM) 100 MG tablet Take 100 mg by mouth daily.   Yes Historical Provider, MD  aspirin 81 MG tablet Take 81 mg by mouth daily.   Yes Historical Provider, MD   doxycycline (VIBRA-TABS) 100 MG tablet Take 100 mg by mouth daily.   Yes Historical Provider, MD  furosemide (LASIX) 40 MG tablet Take 40 mg by mouth daily.   Yes Historical Provider, MD  labetalol (NORMODYNE) 100 MG tablet Take 2 tablets (200 mg total) by mouth 2 (two) times daily. 08/11/13  Yes Adeline Joselyn Glassman, MD  feeding supplement (ENSURE COMPLETE) LIQD Take 237 mLs by mouth 3 (three) times daily between meals. 08/11/13   Kela Millin, MD  loperamide (IMODIUM) 2 MG capsule Take 1 capsule (2 mg total) by mouth as needed for diarrhea or loose stools (give 1 tab after each loose for max of 16mg /day). 08/11/13   Kela Millin, MD    No Known Allergies  Social History:  reports that he has never smoked. He has never used smokeless tobacco. He reports that he does not drink alcohol or use illicit drugs.     History reviewed. No pertinent family history.     Physical Exam:  GEN:  Pleasant  Elderly  79 y.o. African American  male  examined  and in no acute distress; cooperative with exam Filed Vitals:   10/18/13 0400 10/18/13 0530 10/18/13 0600 10/18/13 0711  BP: 161/72 140/68 146/65 164/92  Pulse: 77 78 80 250  Temp:    98.1 F (36.7 C)  TempSrc:    Oral  Resp:  16 14 16   Height:   5' 10.87" (1.8 m) 5\' 10"  (1.778 m)  Weight:   78 kg (171 lb 15.3 oz) 78.699 kg (173 lb 8 oz)  SpO2: 100% 98% 100% 97%   Blood pressure 164/92, pulse 250, temperature 98.1 F (36.7 C), temperature source Oral, resp. rate 16, height 5\' 10"  (1.778 m), weight 78.699 kg (173 lb 8 oz), SpO2 97.00%. PSYCH: He is alert and oriented x4; does not appear anxious does not appear depressed; affect is normal HEENT: Normocephalic and Atraumatic, Mucous membranes pink; PERRLA; EOM intact; Fundi:  Benign;  No scleral icterus, Nares: Patent, Oropharynx: Clear, Edentulous, Neck:  FROM, no cervical lymphadenopathy nor thyromegaly or carotid bruit; no JVD; Breasts:: Not examined CHEST WALL: No tenderness CHEST: Normal  respiration, clear to auscultation bilaterally HEART: Regular rate and rhythm; no murmurs rubs or gallops BACK: No kyphosis or scoliosis; no CVA tenderness ABDOMEN: Positive Bowel Sounds, soft non-tender; no masses, no organomegaly. Rectal Exam: Not done EXTREMITIES: No  cyanosis, clubbing , 2+ EDEMA BLEs; +stage IV Ulcer no ulcerations. Genitalia: Normal Male Genitalia, Uncircumcised, No lesions No Unusual masses,  No palpable testicular masses, Non-tender on exam PULSES: 2+ and symmetric SKIN: Normal hydration no rash or ulceration CNS: Cranial nerves 2-12 grossly intact no focal neurologic deficit    Labs on Admission:  Basic Metabolic Panel:  Recent Labs Lab 10/18/13 0249  NA 136  K 3.2*  CL 101  CO2 26  GLUCOSE 112*  BUN 20  CREATININE 1.88*  CALCIUM 9.9   Liver Function Tests: No results found for this basename: AST, ALT, ALKPHOS, BILITOT, PROT, ALBUMIN,  in the last 168 hours No results found for this basename: LIPASE, AMYLASE,  in the last 168 hours No results found for this basename: AMMONIA,  in the last 168 hours CBC:  Recent Labs Lab 10/18/13 0249  WBC 7.8  NEUTROABS 5.6  HGB 8.0*  HCT 23.7*  MCV 83.2  PLT 297   Cardiac Enzymes: No results found for this basename: CKTOTAL, CKMB, CKMBINDEX, TROPONINI,  in the last 168 hours  BNP (last 3 results) No results found for this basename: PROBNP,  in the last 8760 hours CBG: No results found for this basename: GLUCAP,  in the last 168 hours  Radiological Exams on Admission: Dg Foot Complete Left  10/18/2013   CLINICAL DATA:  Foot ulcer in the internal left foot along the distal 1st metatarsal region.  EXAM: LEFT FOOT - COMPLETE 3+ VIEW  COMPARISON:  09/26/2013 left foot radiographs and MRI left foot 10/09/2013  FINDINGS: There is a large soft tissue defect/ ulceration along the medial aspect of the left foot at the level of the 1st metatarsal head. There is underlying cortical irregularity, cortical  destruction, and mixed sclerotic and lucent change in the metatarsal head. As shown on the previous MRI, the changes are worrisome for osteomyelitis. There is been progression of the size of the ulcer since the previous study. No radiopaque soft tissue foreign bodies. No acute fracture or subluxation.  IMPRESSION: Large soft tissue defect/ ulceration along the medial aspect of the left foot at the 1st metatarsal head with progression since previous study. Underlying bone changes in the metatarsal head consistent with osteomyelitis as shown on previous MRI.   Electronically Signed   By: Burman Nieves M.D.   On: 10/18/2013 05:35       Assessment/Plan Principal Problem:   Cellulitis Active Problems:   Osteomyelitis   Hypertension   Normocytic anemia   Chronic renal impairment     1.   Osteomyelitis/Cellulitis of the Left Foot-  IV Vancomycin and Zosyn and Orthopedics: Dr Lajoyce Corners to see.  2.   HTN- continue Labetalol, and Furosemide.     3.   Normocytic Anemia-   Send Anemia Panel.    4.   CRI-  Monitor BUN/Cr and ivfs for gentle hydration.     5.   DVT prophylaxis  6.   Scrotal pain-  Scrotal Ultrasound ordered.        Code Status:    FULL CODE   Family Communication:    Son at Bedside Disposition Plan:    Inpatient  Time spent:  60 minutes  Ron Parker [facility] Pager [phone]  If 7PM-7AM, please contact night-coverage [email or web address] Password [facility] 10/18/2013, 7:14 AM

## 2013-10-19 ENCOUNTER — Encounter (HOSPITAL_COMMUNITY): Payer: Self-pay | Admitting: Anesthesiology

## 2013-10-19 ENCOUNTER — Encounter (HOSPITAL_COMMUNITY): Payer: Medicare Other | Admitting: Anesthesiology

## 2013-10-19 ENCOUNTER — Encounter (HOSPITAL_COMMUNITY)
Admission: EM | Disposition: A | Discharge status: Home / Self Care | Payer: Self-pay | Source: Home / Self Care | Attending: Internal Medicine

## 2013-10-19 ENCOUNTER — Inpatient Hospital Stay (HOSPITAL_COMMUNITY): Payer: Medicare Other | Admitting: Anesthesiology

## 2013-10-19 DIAGNOSIS — I10 Essential (primary) hypertension: Secondary | ICD-10-CM

## 2013-10-19 DIAGNOSIS — E43 Unspecified severe protein-calorie malnutrition: Secondary | ICD-10-CM

## 2013-10-19 DIAGNOSIS — D62 Acute posthemorrhagic anemia: Secondary | ICD-10-CM

## 2013-10-19 DIAGNOSIS — N189 Chronic kidney disease, unspecified: Secondary | ICD-10-CM

## 2013-10-19 DIAGNOSIS — D649 Anemia, unspecified: Secondary | ICD-10-CM

## 2013-10-19 HISTORY — PX: AMPUTATION: SHX166

## 2013-10-19 LAB — CBC
HCT: 19.9 % — ABNORMAL LOW (ref 39.0–52.0)
Hemoglobin: 6.7 g/dL — CL (ref 13.0–17.0)
Platelets: 244 10*3/uL (ref 150–400)
RBC: 2.38 MIL/uL — ABNORMAL LOW (ref 4.22–5.81)
RBC: 2.41 MIL/uL — ABNORMAL LOW (ref 4.22–5.81)
RDW: 16.4 % — ABNORMAL HIGH (ref 11.5–15.5)
RDW: 16.2 % — ABNORMAL HIGH (ref 11.5–15.5)
WBC: 6.3 10*3/uL (ref 4.0–10.5)
WBC: 5.9 10*3/uL (ref 4.0–10.5)

## 2013-10-19 LAB — BASIC METABOLIC PANEL
Calcium: 9.4 mg/dL (ref 8.4–10.5)
Creatinine, Ser: 1.76 mg/dL — ABNORMAL HIGH (ref 0.50–1.35)
GFR calc non Af Amer: 33 mL/min — ABNORMAL LOW (ref >90)
Sodium: 142 mEq/L (ref 135–145)

## 2013-10-19 LAB — ABO/RH: ABO/RH(D): O POS

## 2013-10-19 LAB — PREPARE RBC (CROSSMATCH)

## 2013-10-19 LAB — HEMOGLOBIN A1C: Hgb A1c MFr Bld: 5.6 % (ref <5.7)

## 2013-10-19 SURGERY — AMPUTATION, FOOT, RAY
Anesthesia: Regional | Site: Toe | Laterality: Left | Wound class: Dirty or Infected

## 2013-10-19 MED ORDER — POTASSIUM CHLORIDE CRYS ER 20 MEQ PO TBCR
40.0000 meq | EXTENDED_RELEASE_TABLET | Freq: Four times a day (QID) | ORAL | Status: AC
  Administered 2013-10-19: 14:00:00 40 meq via ORAL
  Filled 2013-10-19: qty 2

## 2013-10-19 MED ORDER — 0.9 % SODIUM CHLORIDE (POUR BTL) OPTIME
TOPICAL | Status: DC | PRN
  Administered 2013-10-19: 1000 mL

## 2013-10-19 MED ORDER — ONDANSETRON HCL 4 MG/2ML IJ SOLN: 4.0000 mg | Freq: Four times a day (QID) | INTRAMUSCULAR | Status: DC | PRN

## 2013-10-19 MED ORDER — VANCOMYCIN HCL IN DEXTROSE 1-5 GM/200ML-% IV SOLN
1000.0000 mg | INTRAVENOUS | Status: AC
  Administered 2013-10-20 – 2013-10-21 (×2): 1000 mg via INTRAVENOUS
  Filled 2013-10-19 (×2): qty 200

## 2013-10-19 MED ORDER — PROPOFOL 10 MG/ML IV BOLUS
INTRAVENOUS | Status: DC | PRN
  Administered 2013-10-19: 20 mg via INTRAVENOUS
  Administered 2013-10-19: 30 mg via INTRAVENOUS
  Administered 2013-10-19: 20 mg via INTRAVENOUS

## 2013-10-19 MED ORDER — METOCLOPRAMIDE HCL 5 MG/ML IJ SOLN
5.0000 mg | Freq: Three times a day (TID) | INTRAMUSCULAR | Status: DC | PRN
  Filled 2013-10-19: qty 2

## 2013-10-19 MED ORDER — OXYCODONE HCL 5 MG PO TABS
ORAL_TABLET | ORAL | Status: AC
  Administered 2013-10-19: 5 mg via ORAL
  Filled 2013-10-19: qty 1

## 2013-10-19 MED ORDER — ONDANSETRON HCL 4 MG PO TABS: 4.0000 mg | ORAL_TABLET | Freq: Four times a day (QID) | ORAL | Status: DC | PRN

## 2013-10-19 MED ORDER — ALLOPURINOL 100 MG PO TABS
100.0000 mg | ORAL_TABLET | Freq: Two times a day (BID) | ORAL | Status: DC
  Administered 2013-10-19 – 2013-10-21 (×4): 100 mg via ORAL
  Filled 2013-10-19 (×5): qty 1

## 2013-10-19 MED ORDER — HYDROMORPHONE HCL PF 1 MG/ML IJ SOLN
0.2500 mg | INTRAMUSCULAR | Status: DC | PRN
  Administered 2013-10-19: 0.25 mg via INTRAVENOUS

## 2013-10-19 MED ORDER — HYDROMORPHONE HCL PF 1 MG/ML IJ SOLN
INTRAMUSCULAR | Status: AC
  Administered 2013-10-19: 0.25 mg via INTRAVENOUS
  Filled 2013-10-19: qty 1

## 2013-10-19 MED ORDER — LIDOCAINE HCL (CARDIAC) 20 MG/ML IV SOLN
INTRAVENOUS | Status: DC | PRN
  Administered 2013-10-19: 20 mg via INTRAVENOUS

## 2013-10-19 MED ORDER — FENTANYL CITRATE 0.05 MG/ML IJ SOLN
INTRAMUSCULAR | Status: DC | PRN
  Administered 2013-10-19: 50 ug via INTRAVENOUS

## 2013-10-19 MED ORDER — ONDANSETRON HCL 4 MG/2ML IJ SOLN: 4.0000 mg | Freq: Once | INTRAMUSCULAR | Status: DC | PRN

## 2013-10-19 MED ORDER — METOCLOPRAMIDE HCL 10 MG PO TABS: 5.0000 mg | ORAL_TABLET | Freq: Three times a day (TID) | ORAL | Status: DC | PRN

## 2013-10-19 SURGICAL SUPPLY — 39 items
BANDAGE ESMARK 6X9 LF (GAUZE/BANDAGES/DRESSINGS) IMPLANT
BANDAGE GAUZE ELAST BULKY 4 IN (GAUZE/BANDAGES/DRESSINGS) ×2 IMPLANT
BLADE SAW SGTL MED 73X18.5 STR (BLADE) IMPLANT
BNDG CMPR 9X6 STRL LF SNTH (GAUZE/BANDAGES/DRESSINGS)
BNDG COHESIVE 4X5 TAN STRL (GAUZE/BANDAGES/DRESSINGS) ×2 IMPLANT
BNDG COHESIVE 6X5 TAN STRL LF (GAUZE/BANDAGES/DRESSINGS) ×2 IMPLANT
BNDG ESMARK 6X9 LF (GAUZE/BANDAGES/DRESSINGS)
CLOTH BEACON ORANGE TIMEOUT ST (SAFETY) ×1 IMPLANT
CUFF TOURNIQUET SINGLE 34IN LL (TOURNIQUET CUFF) IMPLANT
CUFF TOURNIQUET SINGLE 44IN (TOURNIQUET CUFF) IMPLANT
DRAPE U-SHAPE 47X51 STRL (DRAPES) ×3 IMPLANT
DRSG ADAPTIC 3X8 NADH LF (GAUZE/BANDAGES/DRESSINGS) ×2 IMPLANT
DRSG PAD ABDOMINAL 8X10 ST (GAUZE/BANDAGES/DRESSINGS) ×3 IMPLANT
DURAPREP 26ML APPLICATOR (WOUND CARE) ×2 IMPLANT
ELECT REM PT RETURN 9FT ADLT (ELECTROSURGICAL) ×2
ELECTRODE REM PT RTRN 9FT ADLT (ELECTROSURGICAL) ×1 IMPLANT
GLOVE BIOGEL PI IND STRL 9 (GLOVE) ×1 IMPLANT
GLOVE BIOGEL PI INDICATOR 9 (GLOVE) ×1
GLOVE SURG ORTHO 9.0 STRL STRW (GLOVE) ×2 IMPLANT
GOWN PREVENTION PLUS XLARGE (GOWN DISPOSABLE) ×1 IMPLANT
GOWN SRG XL XLNG 56XLVL 4 (GOWN DISPOSABLE) ×1 IMPLANT
GOWN STRL NON-REIN XL XLG LVL4 (GOWN DISPOSABLE) ×2
KIT BASIN OR (CUSTOM PROCEDURE TRAY) ×2 IMPLANT
KIT ROOM TURNOVER OR (KITS) ×2 IMPLANT
MANIFOLD NEPTUNE II (INSTRUMENTS) ×2 IMPLANT
NS IRRIG 1000ML POUR BTL (IV SOLUTION) ×2 IMPLANT
PACK ORTHO EXTREMITY (CUSTOM PROCEDURE TRAY) ×2 IMPLANT
PAD ARMBOARD 7.5X6 YLW CONV (MISCELLANEOUS) ×4 IMPLANT
PAD CAST 4YDX4 CTTN HI CHSV (CAST SUPPLIES) ×1 IMPLANT
PADDING CAST COTTON 4X4 STRL (CAST SUPPLIES) ×2
SPONGE GAUZE 4X4 12PLY (GAUZE/BANDAGES/DRESSINGS) ×2 IMPLANT
SPONGE LAP 18X18 X RAY DECT (DISPOSABLE) ×3 IMPLANT
STOCKINETTE IMPERVIOUS LG (DRAPES) IMPLANT
SUT ETHILON 2 0 PSLX (SUTURE) ×4 IMPLANT
TOWEL OR 17X24 6PK STRL BLUE (TOWEL DISPOSABLE) ×2 IMPLANT
TOWEL OR 17X26 10 PK STRL BLUE (TOWEL DISPOSABLE) ×2 IMPLANT
TUBE CONNECTING 12X1/4 (SUCTIONS) ×2 IMPLANT
UNDERPAD 30X30 INCONTINENT (UNDERPADS AND DIAPERS) ×2 IMPLANT
WATER STERILE IRR 1000ML POUR (IV SOLUTION) ×1 IMPLANT

## 2013-10-19 NOTE — Preoperative (Signed)
Beta Blockers   Reason not to administer Beta Blockers:beta blocker given last evening

## 2013-10-19 NOTE — Progress Notes (Signed)
Orthopedic Tech Progress Note Patient Details:  Vincent Miranda [birth year] [id number]  Ortho Devices Type of Ortho Device: Darco shoe Ortho Device/Splint Location: LLE Ortho Device/Splint Interventions: Ordered;Application   Jennye Moccasin 10/19/2013, 12:46 PM

## 2013-10-19 NOTE — Progress Notes (Signed)
CRITICAL VALUE ALERT  Critical value received:  hgb 6.5  Date of notification:  10/19/13  Time of notification:  730  Critical value read back: yes  Nurse who received alert:  Madelin Rear, MSN, RN, CMSRN  MD notified (1st page):  [place]  Time of first page:  735  MD notified (2nd page):  Time of second page:  Responding MD:  Arthor Captain  Time MD responded:  735

## 2013-10-19 NOTE — Progress Notes (Signed)
While assessing the patient, I noticed a small pool of blood under his left foot, on sheets and pillow, draining through his surgical dressing.  I reinforced his dressing with an abd pad and kerlix and will continue to monitor.  Macarthur Critchley, RN

## 2013-10-19 NOTE — Anesthesia Procedure Notes (Addendum)
Anesthesia Regional Block:  Ankle block  Pre-Anesthetic Checklist: ,, timeout performed, Correct Patient, Correct Site, Correct Laterality, Correct Procedure, Correct Position, site marked, Risks and benefits discussed,  Surgical consent,  Pre-op evaluation,  At surgeon's request and post-op pain management  Laterality: Left  Prep: chloraprep and alcohol swabs       Needles:  Injection technique: Single-shot      Additional Needles: Ankle block Narrative:  Start time: 10/19/2013 10:15 AM End time: 10/19/2013 10:20 AM Injection made incrementally with aspirations every 5 mL.  Performed by: Personally  Anesthesiologist: Maren Beach MD  Additional Notes: Pt accepts procedure w/ risks. 40cc ( 20 cc 2% Lidocaine and 20cc 0.25% Marcaine w/o epi ) Ant. And Post Tibial Nerves L ankle w/o difficulty or discomfort. GES   Procedure Name: MAC Date/Time: 10/19/2013 11:00 AM Performed by: Marena Chancy Pre-anesthesia Checklist: Patient identified, Patient being monitored, Emergency Drugs available, Timeout performed and Suction available Patient Re-evaluated:Patient Re-evaluated prior to inductionOxygen Delivery Method: Simple face mask

## 2013-10-19 NOTE — Progress Notes (Signed)
[facility] PROGRESS NOTE  Vincent Miranda WUJ:[id number] DOB: [birth year] DOA: 10/18/2013 PCP: Pcp Not In System  HPI/Subjective: Wife at bedside this morning, ready for his surgery.  Assessment/Plan: Principal Problem:   Cellulitis Active Problems:   Osteomyelitis   Hypertension   Normocytic anemia   Chronic renal impairment   Left first toe osteomyelitis -Patient had worsening ulcer, x-ray/MRI consistent with osteomyelitis. -Started on antibiotics empirically. -Seen by orthopedics, for left first ray amputation today.  Normocytic anemia -Presented with hemoglobin of 8.0, repeat hemoglobin is 6.5. -Patient is going for surgery, I will transfuse 2 units of pack RBC. -Anemia profile showed anemia of chronic disease (CKD/osteomyelitis) with low iron.  Hypertension -Reasonably controlled, continue home medications.  CKD stage III -Patient is around his baseline.  Testicular pain -Mentioned to the admitting physician that he had no testicular pain, which is resolved by now. -Ultrasound showed no acute findings. Patient has varicose veins but no torsion.  Code Status: Full code Family Communication: Plan discussed with the patient. Disposition Plan: Remains inpatient   Consultants:  Dr. Lajoyce Corners  Procedures:  Going for left first ray amputation  Antibiotics:  On vancomycin and Zosyn.   Objective: Filed Vitals:   10/19/13 0500  BP: 93/63  Pulse: 111  Temp: 97.3 F (36.3 C)  Resp:     Intake/Output Summary (Last 24 hours) at 10/19/13 1144 Last data filed at 10/19/13 1136  Gross per 24 hour  Intake 2287.5 ml  Output   1650 ml  Net  637.5 ml   Filed Weights   10/18/13 0600 10/18/13 0711  Weight: 78 kg (171 lb 15.3 oz) 78.699 kg (173 lb 8 oz)    Exam: General: Alert and awake, oriented x3, not in any acute distress. HEENT: anicteric sclera, pupils reactive to light and accommodation, EOMI CVS: S1-S2 clear, no murmur rubs or gallops Chest:  clear to auscultation bilaterally, no wheezing, rales or rhonchi Abdomen: soft nontender, nondistended, normal bowel sounds, no organomegaly Extremities: no cyanosis, clubbing or edema noted bilaterally Neuro: Cranial nerves II-XII intact, no focal neurological deficits  Data Reviewed: Basic Metabolic Panel:  Recent Labs Lab 10/18/13 0249 10/19/13 0550  NA 136 142  K 3.2* 3.1*  CL 101 106  CO2 26 26  GLUCOSE 112* 81  BUN 20 15  CREATININE 1.88* 1.76*  CALCIUM 9.9 9.4   Liver Function Tests: No results found for this basename: AST, ALT, ALKPHOS, BILITOT, PROT, ALBUMIN,  in the last 168 hours No results found for this basename: LIPASE, AMYLASE,  in the last 168 hours No results found for this basename: AMMONIA,  in the last 168 hours CBC:  Recent Labs Lab 10/18/13 0249 10/19/13 0550 10/19/13 0920  WBC 7.8 6.3 5.9  NEUTROABS 5.6  --   --   HGB 8.0* 6.5* 6.7*  HCT 23.7* 19.8* 19.9*  MCV 83.2 83.2 82.6  PLT 297 244 232   Cardiac Enzymes: No results found for this basename: CKTOTAL, CKMB, CKMBINDEX, TROPONINI,  in the last 168 hours BNP (last 3 results) No results found for this basename: PROBNP,  in the last 8760 hours CBG: No results found for this basename: GLUCAP,  in the last 168 hours  Micro Recent Results (from the past 240 hour(s))  SURGICAL PCR SCREEN     Status: None   Collection Time    10/18/13  2:15 PM      Result Value Range Status   MRSA, PCR NEGATIVE  NEGATIVE Final   Staphylococcus aureus NEGATIVE  NEGATIVE Final   Comment:            The Xpert SA Assay (FDA     approved for NASAL specimens     in patients over 24 years of age),     is one component of     a comprehensive surveillance     program.  Test performance has     been validated by [employer] for patients greater     than or equal to 51 year old.     It is not intended     to diagnose infection nor to     guide or monitor treatment.     Studies: US Scrotum  10/18/2013    CLINICAL DATA:  Right testicular pain  EXAM: SCROTAL ULTRASOUND  DOPPLER ULTRASOUND OF THE TESTICLES  TECHNIQUE: Complete ultrasound examination of the testicles, epididymis, and other scrotal structures was performed. Color and spectral Doppler ultrasound were also utilized to evaluate blood flow to the testicles.  COMPARISON:  None.  FINDINGS: Right testicle  Measurements: 3.9 x 2.1 x 2.2 cm. Mildly heterogeneous echogenicity inferiorly without discrete mass.  Left testicle  Measurements: 3.5 x 1.9 x 3.0 cm. No mass or microlithiasis visualized.  Right epididymis:  Normal in size and appearance.  Left epididymis:  7 x 6 x 5 mm epididymal head cyst.  Hydrocele:  Small bilateral hydroceles.  Varicocele:  Present bilaterally.  Pulsed Doppler interrogation of both testes demonstrates low resistance arterial and venous waveforms bilaterally.  IMPRESSION: No evidence of testicular torsion.  Small bilateral hydroceles.  Bilateral varicoceles.   Electronically Signed   By: Charline Bills M.D.   On: 10/18/2013 11:38   [place] Art/ven Flow Abd Pelv Doppler  10/18/2013   CLINICAL DATA:  Right testicular pain  EXAM: SCROTAL ULTRASOUND  DOPPLER ULTRASOUND OF THE TESTICLES  TECHNIQUE: Complete ultrasound examination of the testicles, epididymis, and other scrotal structures was performed. Color and spectral Doppler ultrasound were also utilized to evaluate blood flow to the testicles.  COMPARISON:  None.  FINDINGS: Right testicle  Measurements: 3.9 x 2.1 x 2.2 cm. Mildly heterogeneous echogenicity inferiorly without discrete mass.  Left testicle  Measurements: 3.5 x 1.9 x 3.0 cm. No mass or microlithiasis visualized.  Right epididymis:  Normal in size and appearance.  Left epididymis:  7 x 6 x 5 mm epididymal head cyst.  Hydrocele:  Small bilateral hydroceles.  Varicocele:  Present bilaterally.  Pulsed Doppler interrogation of both testes demonstrates low resistance arterial and venous waveforms bilaterally.  IMPRESSION: No  evidence of testicular torsion.  Small bilateral hydroceles.  Bilateral varicoceles.   Electronically Signed   By: Charline Bills M.D.   On: 10/18/2013 11:38   Dg Foot Complete Left  10/18/2013   CLINICAL DATA:  Foot ulcer in the internal left foot along the distal 1st metatarsal region.  EXAM: LEFT FOOT - COMPLETE 3+ VIEW  COMPARISON:  09/26/2013 left foot radiographs and MRI left foot 10/09/2013  FINDINGS: There is a large soft tissue defect/ ulceration along the medial aspect of the left foot at the level of the 1st metatarsal head. There is underlying cortical irregularity, cortical destruction, and mixed sclerotic and lucent change in the metatarsal head. As shown on the previous MRI, the changes are worrisome for osteomyelitis. There is been progression of the size of the ulcer since the previous study. No radiopaque soft tissue foreign bodies. No acute fracture or subluxation.  IMPRESSION: Large soft tissue defect/ ulceration  along the medial aspect of the left foot at the 1st metatarsal head with progression since previous study. Underlying bone changes in the metatarsal head consistent with osteomyelitis as shown on previous MRI.   Electronically Signed   By: Burman Nieves M.D.   On: 10/18/2013 05:35    Scheduled Meds: . [facility] HOLD] allopurinol  100 mg Oral Daily  . [facility] HOLD] aspirin EC  81 mg Oral Daily  . [MAR HOLD] enoxaparin (LOVENOX) injection  30 mg Subcutaneous Q24H  . [MAR HOLD] feeding supplement (ENSURE COMPLETE)  237 mL Oral TID BM  . [facility] HOLD] furosemide  40 mg Oral Daily  . [facility] HOLD] labetalol  200 mg Oral BID  . [MAR HOLD] piperacillin-tazobactam (ZOSYN)  IV  3.375 g Intravenous Q8H  . [facility] HOLD] vancomycin  1,000 mg Intravenous Q24H   Continuous Infusions: . sodium chloride 75 mL/hr at 10/19/13 0515       Time spent: 35 minutes    [facility] A  [facility] Pager [phone] If 7PM-7AM, please contact night-coverage at [email or web address], password  [facility] 10/19/2013, 11:44 AM  LOS: 1 day

## 2013-10-19 NOTE — Anesthesia Preprocedure Evaluation (Signed)
Anesthesia Evaluation  Patient identified by MRN, date of birth, ID band Patient awake    Reviewed: Allergy & Precautions, H&P , NPO status , Patient's Chart, lab work & pertinent test results  Airway       Dental   Pulmonary former smoker,          Cardiovascular hypertension,     Neuro/Psych    GI/Hepatic   Endo/Other    Renal/GU Renal disease     Musculoskeletal   Abdominal   Peds  Hematology  (+) anemia ,   Anesthesia Other Findings   Reproductive/Obstetrics                           Anesthesia Physical Anesthesia Plan  ASA: II  Anesthesia Plan: Regional   Post-op Pain Management: MAC Combined w/ Regional for Post-op pain   Induction: Intravenous  Airway Management Planned: Mask  Additional Equipment:   Intra-op Plan:   Post-operative Plan:   Informed Consent: I have reviewed the patients History and Physical, chart, labs and discussed the procedure including the risks, benefits and alternatives for the proposed anesthesia with the patient or authorized representative who has indicated his/her understanding and acceptance.     Plan Discussed with:   Anesthesia Plan Comments:         Anesthesia Quick Evaluation

## 2013-10-19 NOTE — Op Note (Signed)
OPERATIVE REPORT  DATE OF SURGERY: 10/19/2013  PATIENT:  Vincent Miranda,  79 y.o. male  PRE-OPERATIVE DIAGNOSIS:  gout osteomyelitis,ulceration left foot first metatarsal head  POST-OPERATIVE DIAGNOSIS:  gout osteomyelitis, ulceration left foot first metatarsal head.  PROCEDURE:  Procedure(s): AMPUTATION RAY  LEFT GREAT TOE  SURGEON:  Surgeon(s): Nadara Mustard, MD  ANESTHESIA:   regional  EBL:  min ML  SPECIMEN:  Source of Specimen:  Left foot great toe  TOURNIQUET:    PROCEDURE DETAILS: Patient's 79 year old gentleman with gout ulceration osteomyelitis and abscess of the left great toe MTP joint. Patient presents at this time for first ray amputation. Risks and benefits were discussed including persistent infection nonhealing of the wound need for additional surgery. Patient states he understands and wished to proceed at this time. Description of procedure patient was brought to the operating room and underwent  an ankle block. After adequate levels of anesthesia were obtained patient's left lower extremity was prepped using DuraPrep draped into a sterile field. A racquet incision was made around the toe and the ulcer. The first ray was resected through the base of the first metatarsal medial cuneiform. Electrocautery was used for hemostasis. The wound was irrigated with normal saline. There is no necrotic tissue no tophaceous gouty material within the wound however that there was in the tissue that was excised tophaceous gouty material within the soft tissue an abscess and osteomyelitis. The wound is irrigated with normal saline. The incision was closed using 2-0 nylon. The wound was covered with Adaptic orthopedic sponges AB dressing Kerlix and Coban. Patient was  taken to the PACU in stable condition.  PLAN OF CARE: Admit to inpatient   PATIENT DISPOSITION:  PACU - hemodynamically stable.   Nadara Mustard, MD 10/19/2013 11:36 AM

## 2013-10-19 NOTE — Interval H&P Note (Signed)
History and Physical Interval Note:  10/19/2013 7:42 AM  Vincent Miranda  has presented today for surgery, with the diagnosis of .Marland Kitchen  The various methods of treatment have been discussed with the patient and family. After consideration of risks, benefits and other options for treatment, the patient has consented to  Procedure(s): AMPUTATION RAY (Left) as a surgical intervention .  The patient's history has been reviewed, patient examined, no change in status, stable for surgery.  I have reviewed the patient's chart and labs.  Questions were answered to the patient's satisfaction.     Birdie Fetty V

## 2013-10-19 NOTE — Anesthesia Postprocedure Evaluation (Signed)
  Anesthesia Post-op Note  Patient: Vincent Miranda  Procedure(s) Performed: Procedure(s): AMPUTATION RAY  LEFT GREAT TOE (Left)  Patient Location: PACU  Anesthesia Type:Regional  Level of Consciousness: awake, alert , oriented and patient cooperative  Airway and Oxygen Therapy: Patient Spontanous Breathing  Post-op Pain: none  Post-op Assessment: Post-op Vital signs reviewed, Patient's Cardiovascular Status Stable, Respiratory Function Stable, Patent Airway, No signs of Nausea or vomiting and Pain level controlled  Post-op Vital Signs: stable  Complications: No apparent anesthesia complications

## 2013-10-19 NOTE — Transfer of Care (Signed)
Immediate Anesthesia Transfer of Care Note  Patient: Vincent Miranda  Procedure(s) Performed: Procedure(s): AMPUTATION RAY  LEFT GREAT TOE (Left)  Patient Location: PACU  Anesthesia Type:MAC and Regional  Level of Consciousness: awake, alert  and oriented  Airway & Oxygen Therapy: Patient Spontanous Breathing and Patient connected to face mask oxygen  Post-op Assessment: Report given to PACU RN and Post -op Vital signs reviewed and stable  Post vital signs: Reviewed and stable  Complications: No apparent anesthesia complications

## 2013-10-19 NOTE — H&P (View-Only) (Signed)
Reason for Consult: Gout osteomyelitis ulceration left foot first metatarsal head Referring Physician: Dr Elmahi  Vincent Miranda is an 79 y.o. male.  HPI: Patient is a 79-year-old gentleman with history of gout. Previous this year patient was admitted for gouty inflammation of his wrist. This was treated and resolved well. Patient is developed progressive gouty destructive changes of the great toe left foot patient now has a foul-smelling odor exposed bone necrotic tissue and necrotic tendon.  Past Medical History  Diagnosis Date  . Hypertension     Past Surgical History  Procedure Laterality Date  . Eye surgery      History reviewed. No pertinent family history.  Social History:  reports that he has never smoked. He has never used smokeless tobacco. He reports that he does not drink alcohol or use illicit drugs.  Allergies: No Known Allergies  Medications: I have reviewed the patient's current medications.  Results for orders placed during the hospital encounter of 10/18/13 (from the past 48 hour(s))  CBC WITH DIFFERENTIAL     Status: Abnormal   Collection Time    10/18/13  2:49 AM      Result Value Range   WBC 7.8  4.0 - 10.5 K/uL   RBC 2.85 (*) 4.22 - 5.81 MIL/uL   Hemoglobin 8.0 (*) 13.0 - 17.0 g/dL   HCT 23.7 (*) 39.0 - 52.0 %   MCV 83.2  78.0 - 100.0 fL   MCH 28.1  26.0 - 34.0 pg   MCHC 33.8  30.0 - 36.0 g/dL   RDW 16.3 (*) 11.5 - 15.5 %   Platelets 297  150 - 400 K/uL   Neutrophils Relative % 72  43 - 77 %   Neutro Abs 5.6  1.7 - 7.7 K/uL   Lymphocytes Relative 16  12 - 46 %   Lymphs Abs 1.2  0.7 - 4.0 K/uL   Monocytes Relative 9  3 - 12 %   Monocytes Absolute 0.7  0.1 - 1.0 K/uL   Eosinophils Relative 4  0 - 5 %   Eosinophils Absolute 0.3  0.0 - 0.7 K/uL   Basophils Relative 0  0 - 1 %   Basophils Absolute 0.0  0.0 - 0.1 K/uL  BASIC METABOLIC PANEL     Status: Abnormal   Collection Time    10/18/13  2:49 AM      Result Value Range   Sodium 136  135 -  145 mEq/L   Potassium 3.2 (*) 3.5 - 5.1 mEq/L   Chloride 101  96 - 112 mEq/L   CO2 26  19 - 32 mEq/L   Glucose, Bld 112 (*) 70 - 99 mg/dL   BUN 20  6 - 23 mg/dL   Creatinine, Ser 1.88 (*) 0.50 - 1.35 mg/dL   Calcium 9.9  8.4 - 10.5 mg/dL   GFR calc non Af Amer 31 (*) >90 mL/min   GFR calc Af Amer 36 (*) >90 mL/min   Comment: (NOTE)     The eGFR has been calculated using the CKD EPI equation.     This calculation has not been validated in all clinical situations.     eGFR's persistently <90 mL/min signify possible Chronic Kidney     Disease.  CG4 I-STAT (LACTIC ACID)     Status: Abnormal   Collection Time    10/18/13  3:21 AM      Result Value Range   Lactic Acid, Venous 2.52 (*) 0.5 - 2.2 mmol/L    URIC ACID     Status: Abnormal   Collection Time    10/18/13  4:56 AM      Result Value Range   Uric Acid, Serum 8.9 (*) 4.0 - 7.8 mg/dL  RETICULOCYTES     Status: Abnormal   Collection Time    10/18/13  9:40 AM      Result Value Range   Retic Ct Pct 1.6  0.4 - 3.1 %   RBC. 2.51 (*) 4.22 - 5.81 MIL/uL   Retic Count, Manual 40.2  19.0 - 186.0 K/uL    Us Scrotum  10/18/2013   CLINICAL DATA:  Right testicular pain  EXAM: SCROTAL ULTRASOUND  DOPPLER ULTRASOUND OF THE TESTICLES  TECHNIQUE: Complete ultrasound examination of the testicles, epididymis, and other scrotal structures was performed. Color and spectral Doppler ultrasound were also utilized to evaluate blood flow to the testicles.  COMPARISON:  None.  FINDINGS: Right testicle  Measurements: 3.9 x 2.1 x 2.2 cm. Mildly heterogeneous echogenicity inferiorly without discrete mass.  Left testicle  Measurements: 3.5 x 1.9 x 3.0 cm. No mass or microlithiasis visualized.  Right epididymis:  Normal in size and appearance.  Left epididymis:  7 x 6 x 5 mm epididymal head cyst.  Hydrocele:  Small bilateral hydroceles.  Varicocele:  Present bilaterally.  Pulsed Doppler interrogation of both testes demonstrates low resistance arterial and venous  waveforms bilaterally.  IMPRESSION: No evidence of testicular torsion.  Small bilateral hydroceles.  Bilateral varicoceles.   Electronically Signed   By: Sriyesh  Krishnan M.D.   On: 10/18/2013 11:38   Us Art/ven Flow Abd Pelv Doppler  10/18/2013   CLINICAL DATA:  Right testicular pain  EXAM: SCROTAL ULTRASOUND  DOPPLER ULTRASOUND OF THE TESTICLES  TECHNIQUE: Complete ultrasound examination of the testicles, epididymis, and other scrotal structures was performed. Color and spectral Doppler ultrasound were also utilized to evaluate blood flow to the testicles.  COMPARISON:  None.  FINDINGS: Right testicle  Measurements: 3.9 x 2.1 x 2.2 cm. Mildly heterogeneous echogenicity inferiorly without discrete mass.  Left testicle  Measurements: 3.5 x 1.9 x 3.0 cm. No mass or microlithiasis visualized.  Right epididymis:  Normal in size and appearance.  Left epididymis:  7 x 6 x 5 mm epididymal head cyst.  Hydrocele:  Small bilateral hydroceles.  Varicocele:  Present bilaterally.  Pulsed Doppler interrogation of both testes demonstrates low resistance arterial and venous waveforms bilaterally.  IMPRESSION: No evidence of testicular torsion.  Small bilateral hydroceles.  Bilateral varicoceles.   Electronically Signed   By: Sriyesh  Krishnan M.D.   On: 10/18/2013 11:38   Dg Foot Complete Left  10/18/2013   CLINICAL DATA:  Foot ulcer in the internal left foot along the distal 1st metatarsal region.  EXAM: LEFT FOOT - COMPLETE 3+ VIEW  COMPARISON:  09/26/2013 left foot radiographs and MRI left foot 10/09/2013  FINDINGS: There is a large soft tissue defect/ ulceration along the medial aspect of the left foot at the level of the 1st metatarsal head. There is underlying cortical irregularity, cortical destruction, and mixed sclerotic and lucent change in the metatarsal head. As shown on the previous MRI, the changes are worrisome for osteomyelitis. There is been progression of the size of the ulcer since the previous study.  No radiopaque soft tissue foreign bodies. No acute fracture or subluxation.  IMPRESSION: Large soft tissue defect/ ulceration along the medial aspect of the left foot at the 1st metatarsal head with progression since previous study. Underlying bone changes in   the metatarsal head consistent with osteomyelitis as shown on previous MRI.   Electronically Signed   By: William  Stevens M.D.   On: 10/18/2013 05:35    Review of Systems  All other systems reviewed and are negative.   Blood pressure 128/70, pulse 87, temperature 98.5 F (36.9 C), temperature source Oral, resp. rate 18, height 5' 10" (1.778 m), weight 78.699 kg (173 lb 8 oz), SpO2 93.00%. Physical Exam On examination patient is a faint dorsalis pedis pulse of the left foot. He is a foul-smelling odor from the wound which is approximately 3 cm in diameter over the MTP joint of the great toe. There is exposed necrotic bone exposed necrotic tendon and exposed necrotic soft tissue. Radiographs have shown destructive changes to the MTP joint from his chronic gout. Assessment/Plan: Assessment: Chronic gout myelitis abscess ulceration left foot MCP joint.  Plan: Will plan for a left foot first ray amputation. Risks and benefits were discussed including persistent infection need for additional surgery nonhealing of the wound potential for amputation of an additional digit. Patient and family state they understand and wish to proceed at this time. Plan for surgery tomorrow morning approximately 8:30.  Camari Quintanilla V 10/18/2013, 12:27 PM      

## 2013-10-20 DIAGNOSIS — L0291 Cutaneous abscess, unspecified: Secondary | ICD-10-CM

## 2013-10-20 LAB — TYPE AND SCREEN
ABO/RH(D): O POS
Antibody Screen: NEGATIVE
Unit division: 0
Unit division: 0
Unit division: 0

## 2013-10-20 LAB — CBC
MCH: 28.5 pg (ref 26.0–34.0)
MCV: 83.2 fL (ref 78.0–100.0)
Platelets: 215 10*3/uL (ref 150–400)
RDW: 16.1 % — ABNORMAL HIGH (ref 11.5–15.5)
WBC: 8.4 10*3/uL (ref 4.0–10.5)

## 2013-10-20 NOTE — Evaluation (Signed)
Physical Therapy Evaluation Patient Details Name: Vincent Miranda MRN: [id number] DOB: 10-19-1925 Today's Date: 10/20/2013 Time: 6213-0865 PT Time Calculation (min): 31 min  PT Assessment / Plan / Recommendation History of Present Illness  Pt is an 79 y/o male admitted with gout ulceration osteomyelitis and abscess of the left great toe at the MTP joint. He is now s/p 1st ray great toe amputation and is TDWB with post-op shoe.   Clinical Impression  This patient presents with acute pain and decreased functional independence following the above mentioned procedure. At the time of PT eval, pt was very skeptical about getting out of bed, and only agreed to transfer from the bed to the recliner. Therapist explained function of post-op shoe and TDWB status on L, however pt did not want to try any further ambulation at this time. This patient is appropriate for skilled PT interventions to address functional limitations, improve safety and independence with functional mobility, and return to PLOF.     PT Assessment  Patient needs continued PT services    Follow Up Recommendations  Home health PT    Does the patient have the potential to tolerate intense rehabilitation      Barriers to Discharge        Equipment Recommendations  3in1 (PT) (Tub-bench if 3-in-1 won't fit in tub)    Recommendations for Other Services     Frequency Min 3X/week    Precautions / Restrictions Precautions Precautions: Fall Restrictions Weight Bearing Restrictions: Yes LLE Weight Bearing: Touchdown weight bearing   Pertinent Vitals/Pain Pt reports minimal pain after session, and no pain at rest prior to mobilization to EOB.       Mobility  Bed Mobility Bed Mobility: Supine to Sit;Sitting - Scoot to Edge of Bed Supine to Sit: 4: Min guard;HOB elevated;With rails Sitting - Scoot to Edge of Bed: 4: Min guard Details for Bed Mobility Assistance: VC's for sequencing and technique.  Transfers Transfers:  Sit to Stand;Stand to [employer] Transfers Sit to Stand: 4: Min assist;From bed;With upper extremity assist Stand to Sit: 4: Min guard;To chair/3-in-1;With upper extremity assist Stand Pivot Transfers: 4: Min assist Details for Transfer Assistance: VC's for sequencing and safety awareness with the RW. Assist required for walker placement and pt was cued for TDWB status on L throughout transfer to the recliner.  Ambulation/Gait Ambulation/Gait Assistance: Not tested (comment) Ambulation/Gait Assistance Details: Pt did not want to do too much on his foot at this time.  Gait Pattern: Step-to pattern;Decreased stride length;Shuffle Gait velocity: Decreased    Exercises     PT Diagnosis: Difficulty walking  PT Problem List: Decreased strength;Decreased range of motion;Decreased activity tolerance;Decreased balance;Decreased mobility;Decreased knowledge of use of DME;Decreased safety awareness;Pain;Decreased knowledge of precautions PT Treatment Interventions: DME instruction;Gait training;Stair training;Functional mobility training;Therapeutic activities;Therapeutic exercise;Neuromuscular re-education;Patient/family education     PT Goals(Current goals can be found in the care plan section) Acute Rehab PT Goals Patient Stated Goal: To return home with wife PT Goal Formulation: With patient Time For Goal Achievement: 10/27/13 Potential to Achieve Goals: Good  Visit Information  Last PT Received On: 10/20/13 Assistance Needed: +1 History of Present Illness: Pt is an 79 y/o male admitted with gout ulceration osteomyelitis and abscess of the left great toe at the MTP joint. He is now s/p 1st ray great toe amputation and is TDWB with post-op shoe.        Prior Functioning  Home Living Family/patient expects to be discharged to:: Private residence Living Arrangements: Spouse/significant  other;Children Available Help at Discharge: Family;Available 24 hours/day Type of Home: House Home  Access: Stairs to enter [employer] of Steps: 1 Entrance Stairs-Rails: None Home Layout: One level Home Equipment: Crutches;Walker - 2 wheels Prior Function Level of Independence: Needs assistance Gait / Transfers Assistance Needed: Walker occasionally when going out to doctor's office ADL's / Homemaking Assistance Needed: Wife assisted with all ADL's and bathroom use Communication Communication: No difficulties Dominant Hand: Left    Cognition  Cognition Arousal/Alertness: Awake/alert Behavior During Therapy: WFL for tasks assessed/performed Overall Cognitive Status: Within Functional Limits for tasks assessed    Extremity/Trunk Assessment Upper Extremity Assessment Upper Extremity Assessment: Defer to OT evaluation Lower Extremity Assessment Lower Extremity Assessment: Generalized weakness Cervical / Trunk Assessment Cervical / Trunk Assessment: Normal   Balance Balance Balance Assessed: Yes Static Sitting Balance Static Sitting - Balance Support: Feet supported;Bilateral upper extremity supported Static Sitting - Level of Assistance: 5: Stand by assistance Static Standing Balance Static Standing - Balance Support: Bilateral upper extremity supported Static Standing - Level of Assistance: 4: Min assist  End of Session PT - End of Session Equipment Utilized During Treatment: Gait belt;Other (comment) (Post-op shoe) Activity Tolerance: Patient tolerated treatment well Patient left: in chair;with call bell/phone within reach Nurse Communication: Mobility status;Other (comment) (Blood in urine)  GP     Ruthann Cancer 10/20/2013, 1:00 PM  Ruthann Cancer, PT, DPT [phone]

## 2013-10-20 NOTE — Progress Notes (Signed)
Patient ID: Vincent Miranda, male   DOB: Aug 28, 1925, 78 y.o.   MRN: [id number] Postoperative day 1 status post left foot first ray amputation. There was a deep abscess as well as tophaceous gout associated with the infection. Recommend continue IV antibiotics for 48 hours postoperatively. Hemoglobin 6.7. Orders were written yesterday for 2 units of packed red blood cells. Patient does not have acute blood loss anemia from the surgery. Physical therapy progressive ambulation minimize weightbearing left lower extremity with Dar[place] shoe. Okay for discharge to home when safe with ambulation.

## 2013-10-20 NOTE — Progress Notes (Addendum)
[facility] PROGRESS NOTE  Vincent Miranda AVW:[id number] DOB: [birth year] DOA: 10/18/2013 PCP: Pcp Not In System  Assessment/Plan: Osteomyelitis and abscess of the left great toe -Patient had worsening ulcer, x-ray/MRI consistent with osteomyelitis.  -Started on antibiotics empirically.  -Amputation ray left great toe yesterday, 11/30, Dr. Aldean Miranda -Continue IV zosyn, and vanc until 12/2 per Ortho.  -Pain controlled with PRN opiates. -PT evaluation today to assess ambulation  Normocytic anemia  -Presented with hemoglobin of 8.0, repeat hemoglobin is 6.5 yesterday (11/30) and transfused 2 units PRBC.   -Hbg is 8.3 today -Anemia profile showed anemia of chronic disease (CKD/osteomyelitis) with low iron.   Hypertension  -Reasonably controlled, continue home medications.   CKD stage III  -Patient is around his baseline.   Testicular pain  -Mentioned to the admitting physician that he had testicular pain, which is resolved by now.  -Ultrasound showed no acute findings. Patient has varicose veins but no torsion.   Code Status: FULL Family Communication: Family at bedside. Disposition Plan: Continue as impatient.  Patient expressed that he does not want to go to SNF.   Consultants:  Orthopedics, Dr. Lajoyce Miranda  Procedures:  Amputation ray left great toe  Antibiotics:  Zosyn, 11/29, Day 3  Vancomycin, 11/29, Day 3  HPI/Subjective: Patient is doing well today. Says he is a little weak from not eating. Did not want to talk or be seen by doctor. Says his pain is well controlled. Denies fever, chills, nausea, vomiting, abdominal pain. Agrees to allow PT to see him to evaluate need for assistance upon discharge.  Objective: Filed Vitals:   10/20/13 0942  BP: 122/60  Pulse: 80  Temp:   Resp:     Intake/Output Summary (Last 24 hours) at 10/20/13 1229 Last data filed at 10/20/13 1007  Gross per 24 hour  Intake 1630.83 ml  Output   1350 ml  Net 280.83 ml    Filed Weights   10/18/13 0600 10/18/13 0711  Weight: 78 kg (171 lb 15.3 oz) 78.699 kg (173 lb 8 oz)    Exam:   General:  NAD, patient sitting up in bed, alert and oriented x3  Cardiovascular: RRR, no murmur, gallops, rubs  Respiratory: CTAB, no crackles, rales, rhonchi.  Abdomen: +BS, no tenderness to palpation in all four quadrants  Musculoskeletal: Full range of motion in extremities. Left foot no assessed in ROM. Left food is freshly bandaged, clean and dry dressing covering surgical site.   Data Reviewed: Basic Metabolic Panel:  Recent Labs Lab 10/18/13 0249 10/19/13 0550  NA 136 142  K 3.2* 3.1*  CL 101 106  CO2 26 26  GLUCOSE 112* 81  BUN 20 15  CREATININE 1.88* 1.76*  CALCIUM 9.9 9.4   CBC:  Recent Labs Lab 10/18/13 0249 10/19/13 0550 10/19/13 0920 10/20/13 0600  WBC 7.8 6.3 5.9 8.4  NEUTROABS 5.6  --   --   --   HGB 8.0* 6.5* 6.7* 8.3*  HCT 23.7* 19.8* 19.9* 24.2*  MCV 83.2 83.2 82.6 83.2  PLT 297 244 232 215     Recent Results (from the past 240 hour(s))  SURGICAL PCR SCREEN     Status: None   Collection Time    10/18/13  2:15 PM      Result Value Range Status   MRSA, PCR NEGATIVE  NEGATIVE Final   Staphylococcus aureus NEGATIVE  NEGATIVE Final   Comment:            The Xpert SA Assay (FDA  approved for NASAL specimens     in patients over 89 years of age),     is one component of     a comprehensive surveillance     program.  Test performance has     been validated by [employer] for patients greater     than or equal to 39 year old.     It is not intended     to diagnose infection nor to     guide or monitor treatment.     Studies: No results found.  Scheduled Meds: . allopurinol  100 mg Oral BID  . aspirin EC  81 mg Oral Daily  . enoxaparin (LOVENOX) injection  30 mg Subcutaneous Q24H  . feeding supplement (ENSURE COMPLETE)  237 mL Oral TID BM  . furosemide  40 mg Oral Daily  . labetalol  200 mg Oral BID  .  piperacillin-tazobactam (ZOSYN)  IV  3.375 g Intravenous Q8H  . vancomycin  1,000 mg Intravenous Q24H   Continuous Infusions: . sodium chloride 75 mL/hr at 10/19/13 2015    Principal Problem:   Osteomyelitis Active Problems:   Cellulitis   Hypertension   Normocytic anemia   CKD (chronic kidney disease), stage III      Vincent Quint, PA-S [facility] Vincent Miranda, [place]  [facility] Pager [phone].  If 7PM-7AM, please contact night-coverage at [email or web address], password [facility] 10/20/2013, 12:29 PM  LOS: 2 days      Addendum  Patient seen and examined, chart and data base reviewed.  I agree with the above assessment and plan.  For full details please see Mr Vincent Miranda and Mrs. Vincent Downs PA note.   Vincent Lipps, MD [facility] Pager: [phone] 10/20/2013, 1:12 PM

## 2013-10-21 ENCOUNTER — Encounter (HOSPITAL_COMMUNITY): Payer: Self-pay | Admitting: Orthopedic Surgery

## 2013-10-21 DIAGNOSIS — D638 Anemia in other chronic diseases classified elsewhere: Secondary | ICD-10-CM | POA: Insufficient documentation

## 2013-10-21 DIAGNOSIS — N189 Chronic kidney disease, unspecified: Secondary | ICD-10-CM | POA: Insufficient documentation

## 2013-10-21 DIAGNOSIS — N179 Acute kidney failure, unspecified: Secondary | ICD-10-CM

## 2013-10-21 LAB — BASIC METABOLIC PANEL
Calcium: 9.6 mg/dL (ref 8.4–10.5)
Creatinine, Ser: 1.87 mg/dL — ABNORMAL HIGH (ref 0.50–1.35)
GFR calc Af Amer: 36 mL/min — ABNORMAL LOW (ref >90)
Sodium: 141 mEq/L (ref 135–145)

## 2013-10-21 LAB — CBC
Hemoglobin: 8.6 g/dL — ABNORMAL LOW (ref 13.0–17.0)
Platelets: 218 10*3/uL (ref 150–400)
RBC: 3.03 MIL/uL — ABNORMAL LOW (ref 4.22–5.81)
RDW: 16.1 % — ABNORMAL HIGH (ref 11.5–15.5)
WBC: 9.7 10*3/uL (ref 4.0–10.5)
WBC: 9.3 10*3/uL (ref 4.0–10.5)

## 2013-10-21 MED ORDER — ACETAMINOPHEN 325 MG PO TABS: 650.0000 mg | ORAL_TABLET | Freq: Four times a day (QID) | ORAL | Status: DC | PRN

## 2013-10-21 MED ORDER — POTASSIUM CHLORIDE CRYS ER 20 MEQ PO TBCR
60.0000 meq | EXTENDED_RELEASE_TABLET | Freq: Once | ORAL | Status: AC
  Administered 2013-10-21: 09:00:00 60 meq via ORAL
  Filled 2013-10-21: qty 3

## 2013-10-21 MED ORDER — OXYCODONE HCL 5 MG PO TABS: 5.0000 mg | ORAL_TABLET | ORAL | Status: DC | PRN

## 2013-10-21 MED ORDER — DOXYCYCLINE HYCLATE 100 MG PO TABS: 100.0000 mg | ORAL_TABLET | Freq: Every day | ORAL | Status: DC

## 2013-10-21 NOTE — Care Management Note (Signed)
    Page 1 of 1   10/21/2013     10:43:57 AM   CARE MANAGEMENT NOTE 10/21/2013  Patient:  Vincent Miranda, Vincent Miranda   Account Number:  [id number]  Date Initiated:  10/21/2013  Documentation initiated by:  Letha Cape  Subjective/Objective Assessment:   dx osteomyelitis- s/p amputation of l toe  admit- lives with spouse,     Action/Plan:   pt eval- rec hhpt and 3 n 1 or tub bench   Anticipated DC Date:  10/21/2013   Anticipated DC Plan:  HOME W HOME HEALTH SERVICES      DC Planning Services  CM consult      [facility] Choice  HOME HEALTH  DURABLE MEDICAL EQUIPMENT   Choice offered to / List presented to:  C-1 Patient        HH arranged  HH-1 RN  HH-2 PT  HH - 11 Patient Refused      Status of service:  Completed, signed off Medicare Important Message given?   (If response is "NO", the following Medicare IM given date fields will be blank) Date Medicare IM given:   Date Additional Medicare IM given:    Discharge Disposition:  HOME/SELF CARE  Per UR Regulation:  Reviewed for med. necessity/level of care/duration of stay  If discussed at Long Length of Stay Meetings, dates discussed:    Comments:  10/21/13 10:41 Letha Cape RN BSN [phone] patient lives with spouse, he states wife has been doing his dressing at home and he does not need HHRN or HHPT. NCM spoke with spouse also and she also refuses HH services, NCM spoke with wife about 3 n 1 and /or tub bench  she states they it all taken care of. Patient for dc today.

## 2013-10-21 NOTE — Progress Notes (Signed)
Patient ID: Vincent Miranda, male   DOB: 18-Oct-1925, 79 y.o.   MRN: [id number] Postoperative day 2 left foot first ray amputation. Patient is ambulating independently to the bathroom. I feel that he is safe for discharge to home at this time with home health physical therapy. I will followup in the office in 2 weeks. Discontinue IV antibiotics at time of discharge.

## 2013-10-21 NOTE — Progress Notes (Signed)
Patient discharge home refusing Home Health. Wife was showed how to change the patient dressing. Using adapatic, gauze, 4x4 and ABD pads.  Wife verbalizes understanding using  Teach back. Patient skin is intact otherwise. And patient and wife received discharge instruction and verbalize understanding.

## 2013-10-21 NOTE — Discharge Summary (Signed)
Physician Discharge Summary  Vincent Miranda GNF:[id number] DOB: [birth year] DOA: 10/18/2013  PCP: Pcp Not In System Dr. Clyda Greener  Admit date: 10/18/2013 Discharge date: 10/21/2013  Time spent: 50 minutes  Recommendations for Outpatient Follow-up:  1. Patient to follow up with Orthopedic surgery in 2 weeks with Dr. Lajoyce Corners. 2.   Follow up with Dr. Bruna Potter in 1-2 weeks for chronic anemia, chronic kidney disease  Discharge Diagnoses:  Principal Problem:   Osteomyelitis Active Problems:   Cellulitis   Hypertension   Normocytic anemia   CKD (chronic kidney disease), stage III   Discharge Condition: Stable for discharge home. Refused home PT.  Refused home health RN for wound care.  Diet recommendation: Carb Modified Diet  Filed Weights   10/18/13 0600 10/18/13 0711  Weight: 78 kg (171 lb 15.3 oz) 78.699 kg (173 lb 8 oz)    History of present illness:  Vincent Miranda is a 79 y.o. male with previous history of gout with admission earlier this year for gouty inflammation of his wrist that was treated and resolved well. He presented to the ED with complaints of worsening of the ulcer on his left foot on 11/29 that resulted from gouty destructive changes. He had an MRI done on 11/20 as an outpatient and was referred to see Dr. Lajoyce Corners on 10/20/2013 for consultation. He reports that the ulcer began 3-4 weeks ago and he was placed on antibiotics by his PCP but the ulcer continued to worsen.  He has had increased swelling, and pain from the area over the past week. He was evaluated in the ED and placed on IV Vancomycin and Zosyn and referred for medical admission.     Aside from his wound, he reports that he had sudden increased right Testicular pain, that lasted 1 hour. He denies having any previous similar pain.  Hospital Course:  Assessment/Plan:   Osteomyelitis and abscess of the left great toe  -Patient had worsening ulcer, x-ray/MRI consistent with osteomyelitis.  -Started on  antibiotics empirically.  -Amputation ray left great toe, 11/30, Dr. Aldean Baker  -Continue IV zosyn, and vanc until 12/2 per Ortho.  -Will discharge with hydrocodone and tylenol for pain. -Will discharge with 7 more days of doxycycline. -PT recommends home PT, but patient refuses. Discussed with wife and she also refuses. -Home health for dressing changes recommended, but patient refuses. RN will train wife on dressing changes. Wife agrees to do this twice daily, per orthopedics. -Patient to follow up with Dr. Lajoyce Corners in two weeks.  Normocytic anemia  -Hemoglobin is 6.5 (11/30) and transfused 2 units PRBC.  -Anemia profile showed anemia of chronic disease (CKD/osteomyelitis) with low iron.  -Hbg is 8.6 today. Baseline on admission is 8.0.  Hypertension  -Reasonably controlled -Continue home medications on discharge  CKD stage III  -Patient is around his baseline. Stable for discharge. -Creatinine is 1.87 today.  Testicular pain  -Mentioned to the admitting physician that he had testicular pain, which is resolved by now.  -Ultrasound showed no acute findings. Patient has varicose veins but no torsion. -Patient denies any pain or discomfort.   Procedures: Amputation Ray Left Great Toe, 10/19/13, Dr. Lajoyce Corners  Consultations:  Orthopedics  Physical Therapy  Discharge Exam: Filed Vitals:   10/21/13 0420  BP: 154/82  Pulse: 96  Temp: 99.6 F (37.6 C)  Resp: 18    General: NAD, alert and oriented x3. Patient is laying in bed. Cardiovascular: RRR, no murmur, gallop, rub Respiratory: CTAB, no rales, crackles, rhonchi  Extremities: Range motion intact. Patient is able to ambulate with assistance. No erythema, edema noted. Right food to bandages below the ankle with only the second toe exposed. No drainage noted, dressing is clean and dry.  Discharge Instructions      Discharge Orders   Future Orders Complete By Expires   Call MD for:  redness, tenderness, or signs of  infection (pain, swelling, redness, odor or green/yellow discharge around incision site)  As directed    Call MD for:  temperature >100.4  As directed    Diet - low sodium heart healthy  As directed    Discharge wound care:  As directed    Comments:     Instructed to keep the dressing dry and clean.  Demonstrated changing a dressing. If unable to do so for any reason, make an appointment with the physician to have the dressing changed. Change dressing twice daily until Dr. Lajoyce Corners advises otherwise   Increase activity slowly  As directed        Medication List         acetaminophen 325 MG tablet  Commonly known as:  TYLENOL  Take 2 tablets (650 mg total) by mouth every 6 (six) hours as needed for mild pain (or Fever >/= 101).     allopurinol 100 MG tablet  Commonly known as:  ZYLOPRIM  Take 100 mg by mouth daily.     aspirin 81 MG tablet  Take 81 mg by mouth daily.     doxycycline 100 MG tablet  Commonly known as:  VIBRA-TABS  Take 1 tablet (100 mg total) by mouth daily.     feeding supplement (ENSURE COMPLETE) Liqd  Take 237 mLs by mouth 3 (three) times daily between meals.     furosemide 40 MG tablet  Commonly known as:  LASIX  Take 40 mg by mouth daily.     labetalol 100 MG tablet  Commonly known as:  NORMODYNE  Take 2 tablets (200 mg total) by mouth 2 (two) times daily.     loperamide 2 MG capsule  Commonly known as:  IMODIUM  Take 1 capsule (2 mg total) by mouth as needed for diarrhea or loose stools (give 1 tab after each loose for max of 16mg /day).     oxyCODONE 5 MG immediate release tablet  Commonly known as:  Oxy IR/ROXICODONE  Take 1 tablet (5 mg total) by mouth every 4 (four) hours as needed for moderate pain.       No Known Allergies Follow-up Information   Follow up with DUDA,MARCUS V, MD In 2 weeks.   Specialty:  Orthopedic Surgery   Contact information:   [address] [place] [address] [phone]       Follow up with Dr. Clyda Greener. Schedule an appointment as soon as possible for a visit in 1 week. (See Dr. Bruna Potter in 1 - 2 weeks for hospital followo up.)        The results of significant diagnostics from this hospitalization (including imaging, microbiology, ancillary and laboratory) are listed below for reference.    Significant Diagnostic Studies: US Scrotum  10/18/2013   CLINICAL DATA:  Right testicular pain  EXAM: SCROTAL ULTRASOUND  DOPPLER ULTRASOUND OF THE TESTICLES  TECHNIQUE: Complete ultrasound examination of the testicles, epididymis, and other scrotal structures was performed. Color and spectral Doppler ultrasound were also utilized to evaluate blood flow to the testicles.  COMPARISON:  None.  FINDINGS: Right testicle  Measurements: 3.9 x 2.1 x  2.2 cm. Mildly heterogeneous echogenicity inferiorly without discrete mass.  Left testicle  Measurements: 3.5 x 1.9 x 3.0 cm. No mass or microlithiasis visualized.  Right epididymis:  Normal in size and appearance.  Left epididymis:  7 x 6 x 5 mm epididymal head cyst.  Hydrocele:  Small bilateral hydroceles.  Varicocele:  Present bilaterally.  Pulsed Doppler interrogation of both testes demonstrates low resistance arterial and venous waveforms bilaterally.  IMPRESSION: No evidence of testicular torsion.  Small bilateral hydroceles.  Bilateral varicoceles.   Electronically Signed   By: Charline Bills M.D.   On: 10/18/2013 11:38   Mr Foot Left Wo Contrast  10/10/2013   CLINICAL DATA:  Left foot pain with open sore at the 1st MTP joint for 3 weeks. History of gout in the hands. Evaluate for osteomyelitis. No intravenous contrast was administered secondary to acute kidney injury.  EXAM: MRI OF THE LEFT FOREFOOT WITHOUT CONTRAST  TECHNIQUE: Multiplanar, multisequence MR imaging was performed. No intravenous contrast was administered.  COMPARISON:  Correlation is made with plain radiographs of the left foot dated 09/26/2013.  FINDINGS: There is marrow edema within the  distal half of the 1st metatarsal and proximal 2/3 of the 1st proximal phalanx. There is bone destruction involving the head of the 1st metatarsal and to a lesser degree the base of the 1st proximal phalanx. There is an open wound along the medial aspect of the 1st MTP joint with a few foci of low signal on all imaging sequences which may represent a small amount of air versus bone fragments. There is severe surrounding soft tissue edema.  There is a 17 x 7 mm fluid collection along the plantar aspect of the 1st metatarsal base superficial to the extensor hallucis longus tendon. There is a 2nd fluid collection measuring 15 x 8 mm along the dorsal lateral aspect of the 1st MTP joint. There is a smaller 11 mm fluid collection along the medial dorsal aspect of the 1st MTP joint. There is a 10 mm fluid collection just proximal to the lateral hallux sesamoid. There is severe soft tissue swelling along the dorsal aspect of the 1st MTP joint.There is soft tissue edema within the plantar musculature. There is soft tissue edema within the subcutaneous fat along the dorsal lateral aspect of the foot.  The marrow signal within the 2nd through 5th metatarsals is normal. There are degenerative changes of the TMT joints.  IMPRESSION: 1. There is an open wound along the medial aspect of the 1st MTP joint with severe soft tissue edema in this region. There is bone marrow edema involving the distal half of the 1st metatarsal and proximal 1st phalanx with bone destruction particularly involving the 1st metatarsal head and to lesser degree the base of the 1st proximal phalanx. There are small surrounding fluid collections around the 1st MTP joint. The appearance is concerning for cellulitis with osteomyelitis and surrounding abscess formation. The lack of intravenous contrast does limit the examination. This appearance can be seen in the presence of gout, but given the open wound infection would be favored at this time. Recommend  correlation with tissue cultures.   Electronically Signed   By: Elige Ko   On: 10/10/2013 08:45   [place] Art/ven Flow Abd Pelv Doppler  10/18/2013   CLINICAL DATA:  Right testicular pain  EXAM: SCROTAL ULTRASOUND  DOPPLER ULTRASOUND OF THE TESTICLES  TECHNIQUE: Complete ultrasound examination of the testicles, epididymis, and other scrotal structures was performed. Color and spectral Doppler ultrasound  were also utilized to evaluate blood flow to the testicles.  COMPARISON:  None.  FINDINGS: Right testicle  Measurements: 3.9 x 2.1 x 2.2 cm. Mildly heterogeneous echogenicity inferiorly without discrete mass.  Left testicle  Measurements: 3.5 x 1.9 x 3.0 cm. No mass or microlithiasis visualized.  Right epididymis:  Normal in size and appearance.  Left epididymis:  7 x 6 x 5 mm epididymal head cyst.  Hydrocele:  Small bilateral hydroceles.  Varicocele:  Present bilaterally.  Pulsed Doppler interrogation of both testes demonstrates low resistance arterial and venous waveforms bilaterally.  IMPRESSION: No evidence of testicular torsion.  Small bilateral hydroceles.  Bilateral varicoceles.   Electronically Signed   By: Charline Bills M.D.   On: 10/18/2013 11:38   Dg Foot Complete Left  10/18/2013   CLINICAL DATA:  Foot ulcer in the internal left foot along the distal 1st metatarsal region.  EXAM: LEFT FOOT - COMPLETE 3+ VIEW  COMPARISON:  09/26/2013 left foot radiographs and MRI left foot 10/09/2013  FINDINGS: There is a large soft tissue defect/ ulceration along the medial aspect of the left foot at the level of the 1st metatarsal head. There is underlying cortical irregularity, cortical destruction, and mixed sclerotic and lucent change in the metatarsal head. As shown on the previous MRI, the changes are worrisome for osteomyelitis. There is been progression of the size of the ulcer since the previous study. No radiopaque soft tissue foreign bodies. No acute fracture or subluxation.  IMPRESSION: Large soft  tissue defect/ ulceration along the medial aspect of the left foot at the 1st metatarsal head with progression since previous study. Underlying bone changes in the metatarsal head consistent with osteomyelitis as shown on previous MRI.   Electronically Signed   By: Burman Nieves M.D.   On: 10/18/2013 05:35   Dg Foot Complete Left  09/26/2013   CLINICAL DATA:  Pain and swelling left foot.  Open wound.  EXAM: LEFT FOOT - COMPLETE 3+ VIEW  COMPARISON:  None.  FINDINGS: Lucency and erosion of the distal 1st metatarsal. Associated joint space narrowing of the 1st MTP. No erosion of the 1st phalanx. Soft tissue swelling and wound is present in this area suggestive of osteomyelitis. This may also be due to gout.  No acute fracture. Otherwise space at calcaneal spurring.  IMPRESSION: Erosive arthritis 1st MTP joint. Findings suspicious for osteomyelitis. Further evaluation with MRI with contrast may be helpful.   Electronically Signed   By: Marlan Palau M.D.   On: 09/26/2013 10:06    Microbiology: Recent Results (from the past 240 hour(s))  SURGICAL PCR SCREEN     Status: None   Collection Time    10/18/13  2:15 PM      Result Value Range Status   MRSA, PCR NEGATIVE  NEGATIVE Final   Staphylococcus aureus NEGATIVE  NEGATIVE Final   Comment:            The Xpert SA Assay (FDA     approved for NASAL specimens     in patients over 27 years of age),     is one component of     a comprehensive surveillance     program.  Test performance has     been validated by [employer] for patients greater     than or equal to 56 year old.     It is not intended     to diagnose infection nor to     guide  or monitor treatment.     Labs: Basic Metabolic Panel:  Recent Labs Lab 10/18/13 0249 10/19/13 0550 10/21/13 0405  NA 136 142 141  K 3.2* 3.1* 3.3*  CL 101 106 105  CO2 26 26 25   GLUCOSE 112* 81 91  BUN 20 15 15   CREATININE 1.88* 1.76* 1.87*  CALCIUM 9.9 9.4 9.6   CBC:  Recent  Labs Lab 10/18/13 0249 10/19/13 0550 10/19/13 0920 10/20/13 0600 10/21/13 0405 10/21/13 0800  WBC 7.8 6.3 5.9 8.4 9.7 9.3  NEUTROABS 5.6  --   --   --   --   --   HGB 8.0* 6.5* 6.7* 8.3* 8.5* 8.6*  HCT 23.7* 19.8* 19.9* 24.2* 26.2* 25.5*  MCV 83.2 83.2 82.6 83.2 85.6 84.2  PLT 297 244 232 215 218 218       Signed:  [name], PA-S [facility], [place] [phone]  [facility] 10/21/2013, 11:48 AM

## 2013-10-21 NOTE — Discharge Summary (Signed)
Addendum  Patient seen and examined, chart and data base reviewed.  I agree with the above assessment and plan.  For full details please see Mrs. [name] PA note.  Patient and his wife declined home health services, discharged on doxycycline.  Patient to followup with Dr. Lajoyce Corners in 2 weeks.   Clint Lipps, MD [facility] Pager: [phone] 10/21/2013, 12:25 PM

## 2014-06-15 ENCOUNTER — Emergency Department (HOSPITAL_COMMUNITY)
Admission: EM | Admit: 2014-06-15 | Discharge: 2014-06-15 | Disposition: A | Discharge status: Home / Self Care | Payer: Medicare Other | Attending: Emergency Medicine | Admitting: Emergency Medicine

## 2014-06-15 ENCOUNTER — Encounter (HOSPITAL_COMMUNITY): Payer: Self-pay | Admitting: Emergency Medicine

## 2014-06-15 DIAGNOSIS — Z79899 Other long term (current) drug therapy: Secondary | ICD-10-CM | POA: Insufficient documentation

## 2014-06-15 DIAGNOSIS — N189 Chronic kidney disease, unspecified: Secondary | ICD-10-CM | POA: Diagnosis not present

## 2014-06-15 DIAGNOSIS — L02419 Cutaneous abscess of limb, unspecified: Secondary | ICD-10-CM | POA: Diagnosis not present

## 2014-06-15 DIAGNOSIS — D638 Anemia in other chronic diseases classified elsewhere: Secondary | ICD-10-CM | POA: Insufficient documentation

## 2014-06-15 DIAGNOSIS — M109 Gout, unspecified: Secondary | ICD-10-CM | POA: Insufficient documentation

## 2014-06-15 DIAGNOSIS — I129 Hypertensive chronic kidney disease with stage 1 through stage 4 chronic kidney disease, or unspecified chronic kidney disease: Secondary | ICD-10-CM | POA: Diagnosis not present

## 2014-06-15 DIAGNOSIS — L03119 Cellulitis of unspecified part of limb: Principal | ICD-10-CM

## 2014-06-15 DIAGNOSIS — L03115 Cellulitis of right lower limb: Secondary | ICD-10-CM

## 2014-06-15 DIAGNOSIS — M7989 Other specified soft tissue disorders: Secondary | ICD-10-CM | POA: Diagnosis present

## 2014-06-15 DIAGNOSIS — Z7982 Long term (current) use of aspirin: Secondary | ICD-10-CM | POA: Diagnosis not present

## 2014-06-15 HISTORY — DX: Gout, unspecified: M10.9

## 2014-06-15 LAB — CBC
HCT: 33 % — ABNORMAL LOW (ref 39.0–52.0)
Hemoglobin: 11 g/dL — ABNORMAL LOW (ref 13.0–17.0)
MCH: 28.4 pg (ref 26.0–34.0)
MCHC: 33.3 g/dL (ref 30.0–36.0)
MCV: 85.3 fL (ref 78.0–100.0)
Platelets: 189 10*3/uL (ref 150–400)
RBC: 3.87 MIL/uL — ABNORMAL LOW (ref 4.22–5.81)
RDW: 15 % (ref 11.5–15.5)
WBC: 6.2 10*3/uL (ref 4.0–10.5)

## 2014-06-15 LAB — BASIC METABOLIC PANEL
Anion gap: 13 (ref 5–15)
BUN: 23 mg/dL (ref 6–23)
CHLORIDE: 100 meq/L (ref 96–112)
CO2: 26 mEq/L (ref 19–32)
CREATININE: 1.91 mg/dL — AB (ref 0.50–1.35)
Calcium: 10.1 mg/dL (ref 8.4–10.5)
GFR calc non Af Amer: 30 mL/min — ABNORMAL LOW (ref >90)
GFR, EST AFRICAN AMERICAN: 35 mL/min — AB (ref >90)
Glucose, Bld: 88 mg/dL (ref 70–99)
Potassium: 3.6 mEq/L — ABNORMAL LOW (ref 3.7–5.3)
Sodium: 139 mEq/L (ref 137–147)

## 2014-06-15 MED ORDER — CEPHALEXIN 500 MG PO CAPS: 500.0000 mg | ORAL_CAPSULE | Freq: Four times a day (QID) | ORAL | Status: DC

## 2014-06-15 NOTE — ED Notes (Signed)
Pt educated on vascular study to take place tomorrow, pt and family verbalize understanding

## 2014-06-15 NOTE — ED Notes (Signed)
Patient presents today with a chief complaint of bilateral lower extremity edema with weeping wounds and flaking skin x 1 month. Patient also reports intermittent numbness/tingling and pain, family reports history of poor circulation to legs and feet.

## 2014-06-15 NOTE — ED Notes (Signed)
Family member reports that patient walks independently in home

## 2014-06-15 NOTE — ED Provider Notes (Signed)
CSN: [id number]     Arrival date & time 06/15/14  1830 History   First MD Initiated Contact with Patient 06/15/14 2004     Chief Complaint  Patient presents with  . Leg Swelling     (Consider location/radiation/quality/duration/timing/severity/associated sxs/prior Treatment) Patient is a 80 y.o. male presenting with general illness.  Illness Location:  Bilateral lower extremities Quality:  Swelling Severity:  Moderate Onset quality:  Gradual Duration:  1 month Timing:  Constant Progression:  Unchanged Chronicity:  Chronic Relieved by:  Nothing Associated symptoms: no abdominal pain, no chest pain, no congestion, no cough, no fever, no headaches, no nausea, no rash, no shortness of breath, no sore throat and no vomiting     Past Medical History  Diagnosis Date  . Hypertension   . Chronic kidney disease   . Anemia of chronic disease   . Gout    Past Surgical History  Procedure Laterality Date  . Eye surgery    . Amputation Left 10/19/2013    Procedure: AMPUTATION RAY  LEFT GREAT TOE;  Surgeon: Nadara MustardMarcus V Duda, MD;  Location: [facility] OR;  Service: Orthopedics;  Laterality: Left;   No family history on file. History  Substance Use Topics  . Smoking status: Never Smoker   . Smokeless tobacco: Never Used  . Alcohol Use: No    Review of Systems  Constitutional: Negative for fever and chills.  HENT: Negative for congestion and sore throat.   Eyes: Negative for pain.  Respiratory: Negative for cough and shortness of breath.   Cardiovascular: Positive for leg swelling. Negative for chest pain.  Gastrointestinal: Negative for nausea, vomiting and abdominal pain.  Genitourinary: Negative for dysuria and flank pain.  Musculoskeletal: Negative for back pain and neck pain.  Skin: Negative for rash.  Neurological: Negative for seizures and headaches.      Allergies  Review of patient's allergies indicates no known allergies.  Home Medications   Prior to Admission medications    Medication Sig Start Date End Date Taking? Authorizing Provider  allopurinol (ZYLOPRIM) 100 MG tablet Take 200 mg by mouth daily.    Yes Historical Provider, MD  aspirin 81 MG tablet Take 81 mg by mouth daily.   Yes Historical Provider, MD  furosemide (LASIX) 40 MG tablet Take 40 mg by mouth 2 (two) times daily.    Yes Historical Provider, MD  IRON PO Take 1 tablet by mouth daily.   Yes Historical Provider, MD  labetalol (NORMODYNE) 100 MG tablet Take 2 tablets (200 mg total) by mouth 2 (two) times daily. 08/11/13  Yes Adeline C Viyuoh, MD  POTASSIUM PO Take 1 tablet by mouth daily.   Yes Historical Provider, MD  cephALEXin (KEFLEX) 500 MG capsule Take 1 capsule (500 mg total) by mouth 4 (four) times daily. 06/15/14   Imagene ShellerSteve Seyed Heffley, MD   BP 202/86  Pulse 74  Temp(Src) 98.3 F (36.8 C) (Oral)  Resp 20  SpO2 100% Physical Exam  Constitutional: He is oriented to person, place, and time. He appears well-developed and well-nourished. No distress.  HENT:  Head: Normocephalic and atraumatic.  Eyes: Pupils are equal, round, and reactive to light.  Neck: Normal range of motion.  Cardiovascular: Normal rate and regular rhythm.   Pulmonary/Chest: Effort normal and breath sounds normal.  Abdominal: Soft. He exhibits no distension. There is no tenderness.  Musculoskeletal: Normal range of motion.       Right lower leg: He exhibits swelling and edema.  Left lower leg: He exhibits swelling and edema.  Neurological: He is alert and oriented to person, place, and time.  Skin: Skin is warm. He is not diaphoretic.   ED Course  Procedures (including critical care time) Labs Review Labs Reviewed  CBC - Abnormal; Notable for the following:    RBC 3.87 (*)    Hemoglobin 11.0 (*)    HCT 33.0 (*)    All other components within normal limits  BASIC METABOLIC PANEL - Abnormal; Notable for the following:    Potassium 3.6 (*)    Creatinine, Ser 1.91 (*)    GFR calc non Af Amer 30 (*)    GFR calc  Af Amer 35 (*)    All other components within normal limits    Imaging Review No results found.   EKG Interpretation None      MDM   Final diagnoses:  Cellulitis of right lower extremity   80 year old male with a history of hypertension and chronic kidney disease presents today with bilateral lower extremities swelling for proximally one month.  Patient seen and evaluated by his primary care physician previously. He has been told that he has poor circulation of his bilateral lower extremities. Patient continues to have swelling without any pain but also does have some weeping from his swollen ankles.  Upon arrival here the patient is hemodynamically stable in no acute distress. He is afebrile with normal vital signs. Patient is not complaining of any shortness of breath or any chest pain. Patient's lower extremities are swollen and edematous. Exam consistent with chronic venous stasis changes. The right leg appears slightly more swollen than the left. Patient was told to come here for possible DVT.   Patient's basic labs are unremarkable. Vascular ultrasound available at this time. Low suspicion of DVT at this time. Recommended the patient return in the morning for a DVT ultrasound. Patient with swelling and redness on the right leg worsen on the left. Possible cellulitis on the right leg. Will treat with Keflex as an outpatient. Patient discharged in stable condition. Strict return precautions given. Patient seen and evaluated by myself and by the attending Dr. Lynelle Doctor.      Imagene Sheller, MD 06/16/14 Jacinta Shoe

## 2014-06-16 ENCOUNTER — Ambulatory Visit (HOSPITAL_COMMUNITY)
Admission: RE | Admit: 2014-06-16 | Discharge: 2014-06-16 | Disposition: A | Discharge status: Home / Self Care | Payer: Medicare Other | Source: Ambulatory Visit | Attending: Emergency Medicine | Admitting: Emergency Medicine

## 2014-06-16 DIAGNOSIS — M7989 Other specified soft tissue disorders: Secondary | ICD-10-CM | POA: Insufficient documentation

## 2014-06-16 NOTE — Progress Notes (Signed)
VASCULAR LAB PRELIMINARY  PRELIMINARY  PRELIMINARY  PRELIMINARY  Bilateral lower extremity venous duplex  completed.    Preliminary report:  Bilateral:  No evidence of DVT, superficial thrombosis, or Baker's Cyst.    Enez Monahan, RVT 06/16/2014, 2:31 PM

## 2014-06-17 NOTE — ED Provider Notes (Signed)
I saw and evaluated the patient, reviewed the resident's note and I agree with the findings and plan.  Pt has a history of chronic lymphedema.  Increasing swelling and redness recently.  Doubt DVT but will arrange for follow up study tomorrow..  Will start on oral abx for possible early cellulitis.   Linwood DibblesJon Daryl Beehler, MD 06/17/14 [phone]

## 2014-11-26 DIAGNOSIS — L0390 Cellulitis, unspecified: Secondary | ICD-10-CM | POA: Diagnosis not present

## 2014-12-07 ENCOUNTER — Encounter (HOSPITAL_BASED_OUTPATIENT_CLINIC_OR_DEPARTMENT_OTHER): Payer: Medicare Other | Attending: Plastic Surgery

## 2015-03-04 DIAGNOSIS — M109 Gout, unspecified: Secondary | ICD-10-CM | POA: Diagnosis not present

## 2015-03-25 DIAGNOSIS — R404 Transient alteration of awareness: Secondary | ICD-10-CM | POA: Diagnosis not present

## 2015-03-25 DIAGNOSIS — R531 Weakness: Secondary | ICD-10-CM | POA: Diagnosis not present

## 2015-03-26 ENCOUNTER — Inpatient Hospital Stay (HOSPITAL_COMMUNITY): Payer: Medicare Other

## 2015-03-26 ENCOUNTER — Inpatient Hospital Stay (HOSPITAL_COMMUNITY)
Admission: EM | Admit: 2015-03-26 | Discharge: 2015-04-02 | DRG: 871 | Disposition: A | Discharge status: Home / Self Care | Payer: Medicare Other | Attending: Internal Medicine | Admitting: Internal Medicine

## 2015-03-26 ENCOUNTER — Other Ambulatory Visit (HOSPITAL_COMMUNITY): Payer: Self-pay

## 2015-03-26 ENCOUNTER — Emergency Department (HOSPITAL_COMMUNITY): Payer: Medicare Other

## 2015-03-26 ENCOUNTER — Encounter (HOSPITAL_COMMUNITY): Payer: Self-pay | Admitting: Emergency Medicine

## 2015-03-26 DIAGNOSIS — I350 Nonrheumatic aortic (valve) stenosis: Secondary | ICD-10-CM | POA: Diagnosis not present

## 2015-03-26 DIAGNOSIS — M86171 Other acute osteomyelitis, right ankle and foot: Secondary | ICD-10-CM | POA: Diagnosis not present

## 2015-03-26 DIAGNOSIS — I96 Gangrene, not elsewhere classified: Secondary | ICD-10-CM | POA: Diagnosis not present

## 2015-03-26 DIAGNOSIS — R6520 Severe sepsis without septic shock: Secondary | ICD-10-CM | POA: Diagnosis present

## 2015-03-26 DIAGNOSIS — S91301D Unspecified open wound, right foot, subsequent encounter: Secondary | ICD-10-CM | POA: Diagnosis not present

## 2015-03-26 DIAGNOSIS — R531 Weakness: Secondary | ICD-10-CM | POA: Diagnosis not present

## 2015-03-26 DIAGNOSIS — N19 Unspecified kidney failure: Secondary | ICD-10-CM | POA: Diagnosis not present

## 2015-03-26 DIAGNOSIS — T8744 Infection of amputation stump, left lower extremity: Secondary | ICD-10-CM | POA: Diagnosis present

## 2015-03-26 DIAGNOSIS — I739 Peripheral vascular disease, unspecified: Secondary | ICD-10-CM | POA: Diagnosis present

## 2015-03-26 DIAGNOSIS — A480 Gas gangrene: Secondary | ICD-10-CM | POA: Diagnosis not present

## 2015-03-26 DIAGNOSIS — M869 Osteomyelitis, unspecified: Secondary | ICD-10-CM | POA: Diagnosis not present

## 2015-03-26 DIAGNOSIS — D649 Anemia, unspecified: Secondary | ICD-10-CM

## 2015-03-26 DIAGNOSIS — N179 Acute kidney failure, unspecified: Secondary | ICD-10-CM | POA: Diagnosis not present

## 2015-03-26 DIAGNOSIS — I70263 Atherosclerosis of native arteries of extremities with gangrene, bilateral legs: Secondary | ICD-10-CM | POA: Diagnosis present

## 2015-03-26 DIAGNOSIS — N183 Chronic kidney disease, stage 3 unspecified: Secondary | ICD-10-CM | POA: Diagnosis present

## 2015-03-26 DIAGNOSIS — L02612 Cutaneous abscess of left foot: Secondary | ICD-10-CM | POA: Diagnosis not present

## 2015-03-26 DIAGNOSIS — A419 Sepsis, unspecified organism: Principal | ICD-10-CM | POA: Diagnosis present

## 2015-03-26 DIAGNOSIS — E43 Unspecified severe protein-calorie malnutrition: Secondary | ICD-10-CM | POA: Diagnosis present

## 2015-03-26 DIAGNOSIS — E870 Hyperosmolality and hypernatremia: Secondary | ICD-10-CM | POA: Diagnosis present

## 2015-03-26 DIAGNOSIS — D638 Anemia in other chronic diseases classified elsewhere: Secondary | ICD-10-CM | POA: Diagnosis not present

## 2015-03-26 DIAGNOSIS — D500 Iron deficiency anemia secondary to blood loss (chronic): Secondary | ICD-10-CM | POA: Diagnosis not present

## 2015-03-26 DIAGNOSIS — I129 Hypertensive chronic kidney disease with stage 1 through stage 4 chronic kidney disease, or unspecified chronic kidney disease: Secondary | ICD-10-CM | POA: Diagnosis present

## 2015-03-26 DIAGNOSIS — I70268 Atherosclerosis of native arteries of extremities with gangrene, other extremity: Secondary | ICD-10-CM | POA: Diagnosis not present

## 2015-03-26 DIAGNOSIS — Z89412 Acquired absence of left great toe: Secondary | ICD-10-CM | POA: Diagnosis not present

## 2015-03-26 DIAGNOSIS — E860 Dehydration: Secondary | ICD-10-CM | POA: Diagnosis present

## 2015-03-26 DIAGNOSIS — L0390 Cellulitis, unspecified: Secondary | ICD-10-CM | POA: Diagnosis not present

## 2015-03-26 DIAGNOSIS — E871 Hypo-osmolality and hyponatremia: Secondary | ICD-10-CM | POA: Diagnosis present

## 2015-03-26 DIAGNOSIS — Z79899 Other long term (current) drug therapy: Secondary | ICD-10-CM | POA: Diagnosis not present

## 2015-03-26 DIAGNOSIS — Z7982 Long term (current) use of aspirin: Secondary | ICD-10-CM | POA: Diagnosis not present

## 2015-03-26 DIAGNOSIS — D62 Acute posthemorrhagic anemia: Secondary | ICD-10-CM | POA: Diagnosis present

## 2015-03-26 DIAGNOSIS — M109 Gout, unspecified: Secondary | ICD-10-CM | POA: Diagnosis present

## 2015-03-26 DIAGNOSIS — M7732 Calcaneal spur, left foot: Secondary | ICD-10-CM | POA: Diagnosis not present

## 2015-03-26 DIAGNOSIS — I248 Other forms of acute ischemic heart disease: Secondary | ICD-10-CM | POA: Diagnosis present

## 2015-03-26 DIAGNOSIS — R195 Other fecal abnormalities: Secondary | ICD-10-CM | POA: Diagnosis present

## 2015-03-26 DIAGNOSIS — R0600 Dyspnea, unspecified: Secondary | ICD-10-CM | POA: Diagnosis not present

## 2015-03-26 DIAGNOSIS — E876 Hypokalemia: Secondary | ICD-10-CM | POA: Diagnosis present

## 2015-03-26 DIAGNOSIS — M86172 Other acute osteomyelitis, left ankle and foot: Secondary | ICD-10-CM | POA: Diagnosis not present

## 2015-03-26 DIAGNOSIS — K819 Cholecystitis, unspecified: Secondary | ICD-10-CM | POA: Diagnosis not present

## 2015-03-26 DIAGNOSIS — R778 Other specified abnormalities of plasma proteins: Secondary | ICD-10-CM | POA: Diagnosis present

## 2015-03-26 DIAGNOSIS — M868X7 Other osteomyelitis, ankle and foot: Secondary | ICD-10-CM | POA: Diagnosis not present

## 2015-03-26 DIAGNOSIS — I70269 Atherosclerosis of native arteries of extremities with gangrene, unspecified extremity: Secondary | ICD-10-CM | POA: Diagnosis not present

## 2015-03-26 DIAGNOSIS — Z792 Long term (current) use of antibiotics: Secondary | ICD-10-CM

## 2015-03-26 DIAGNOSIS — M8610 Other acute osteomyelitis, unspecified site: Secondary | ICD-10-CM | POA: Diagnosis not present

## 2015-03-26 DIAGNOSIS — B9689 Other specified bacterial agents as the cause of diseases classified elsewhere: Secondary | ICD-10-CM | POA: Diagnosis not present

## 2015-03-26 DIAGNOSIS — I6782 Cerebral ischemia: Secondary | ICD-10-CM | POA: Diagnosis not present

## 2015-03-26 DIAGNOSIS — R5383 Other fatigue: Secondary | ICD-10-CM | POA: Diagnosis not present

## 2015-03-26 DIAGNOSIS — I10 Essential (primary) hypertension: Secondary | ICD-10-CM | POA: Diagnosis present

## 2015-03-26 DIAGNOSIS — R7989 Other specified abnormal findings of blood chemistry: Secondary | ICD-10-CM

## 2015-03-26 DIAGNOSIS — M14671 Charcot's joint, right ankle and foot: Secondary | ICD-10-CM | POA: Diagnosis not present

## 2015-03-26 DIAGNOSIS — L02611 Cutaneous abscess of right foot: Secondary | ICD-10-CM | POA: Diagnosis not present

## 2015-03-26 DIAGNOSIS — G319 Degenerative disease of nervous system, unspecified: Secondary | ICD-10-CM | POA: Diagnosis not present

## 2015-03-26 HISTORY — DX: Acute posthemorrhagic anemia: D62

## 2015-03-26 LAB — LACTIC ACID, PLASMA
Lactic Acid, Venous: 2.2 mmol/L (ref 0.5–2.0)
Lactic Acid, Venous: 3 mmol/L (ref 0.5–2.0)

## 2015-03-26 LAB — BASIC METABOLIC PANEL
Anion gap: 8 (ref 5–15)
BUN: 96 mg/dL — AB (ref 6–20)
CO2: 23 mmol/L (ref 22–32)
CREATININE: 2.84 mg/dL — AB (ref 0.61–1.24)
Calcium: 9.4 mg/dL (ref 8.9–10.3)
Chloride: 118 mmol/L — ABNORMAL HIGH (ref 101–111)
GFR calc Af Amer: 21 mL/min — ABNORMAL LOW (ref >60)
GFR calc non Af Amer: 18 mL/min — ABNORMAL LOW (ref >60)
GLUCOSE: 119 mg/dL — AB (ref 70–99)
Potassium: 3.1 mmol/L — ABNORMAL LOW (ref 3.5–5.1)
Sodium: 149 mmol/L — ABNORMAL HIGH (ref 135–145)

## 2015-03-26 LAB — CBC WITH DIFFERENTIAL/PLATELET
BASOS ABS: 0 10*3/uL (ref 0.0–0.1)
Basophils Relative: 0 % (ref 0–1)
EOS ABS: 0 10*3/uL (ref 0.0–0.7)
EOS PCT: 0 % (ref 0–5)
HCT: 22.6 % — ABNORMAL LOW (ref 39.0–52.0)
Hemoglobin: 7.2 g/dL — ABNORMAL LOW (ref 13.0–17.0)
LYMPHS PCT: 7 % — AB (ref 12–46)
Lymphs Abs: 0.8 10*3/uL (ref 0.7–4.0)
MCH: 24.9 pg — AB (ref 26.0–34.0)
MCHC: 31.9 g/dL (ref 30.0–36.0)
MCV: 78.2 fL (ref 78.0–100.0)
Monocytes Absolute: 1.2 10*3/uL — ABNORMAL HIGH (ref 0.1–1.0)
Monocytes Relative: 10 % (ref 3–12)
Neutro Abs: 10.4 10*3/uL — ABNORMAL HIGH (ref 1.7–7.7)
Neutrophils Relative %: 83 % — ABNORMAL HIGH (ref 43–77)
PLATELETS: 278 10*3/uL (ref 150–400)
RBC: 2.89 MIL/uL — AB (ref 4.22–5.81)
RDW: 17.2 % — ABNORMAL HIGH (ref 11.5–15.5)
WBC: 12.5 10*3/uL — ABNORMAL HIGH (ref 4.0–10.5)

## 2015-03-26 LAB — URINALYSIS, ROUTINE W REFLEX MICROSCOPIC
BILIRUBIN URINE: NEGATIVE
GLUCOSE, UA: NEGATIVE mg/dL
Hgb urine dipstick: NEGATIVE
KETONES UR: NEGATIVE mg/dL
Nitrite: NEGATIVE
PH: 5 (ref 5.0–8.0)
Protein, ur: 30 mg/dL — AB
Specific Gravity, Urine: 1.013 (ref 1.005–1.030)
Urobilinogen, UA: 0.2 mg/dL (ref 0.0–1.0)

## 2015-03-26 LAB — COMPREHENSIVE METABOLIC PANEL
ALK PHOS: 126 U/L (ref 38–126)
ALT: 30 U/L (ref 17–63)
ANION GAP: 14 (ref 5–15)
AST: 28 U/L (ref 15–41)
Albumin: 1.7 g/dL — ABNORMAL LOW (ref 3.5–5.0)
BILIRUBIN TOTAL: 0.4 mg/dL (ref 0.3–1.2)
BUN: 101 mg/dL — AB (ref 6–20)
CO2: 21 mmol/L — AB (ref 22–32)
Calcium: 9.8 mg/dL (ref 8.9–10.3)
Chloride: 112 mmol/L — ABNORMAL HIGH (ref 101–111)
Creatinine, Ser: 2.98 mg/dL — ABNORMAL HIGH (ref 0.61–1.24)
GFR calc non Af Amer: 17 mL/min — ABNORMAL LOW (ref >60)
GFR, EST AFRICAN AMERICAN: 20 mL/min — AB (ref >60)
Glucose, Bld: 171 mg/dL — ABNORMAL HIGH (ref 70–99)
Potassium: 3.3 mmol/L — ABNORMAL LOW (ref 3.5–5.1)
Sodium: 147 mmol/L — ABNORMAL HIGH (ref 135–145)
Total Protein: 7.5 g/dL (ref 6.5–8.1)

## 2015-03-26 LAB — CBC
HCT: 26 % — ABNORMAL LOW (ref 39.0–52.0)
Hemoglobin: 8.3 g/dL — ABNORMAL LOW (ref 13.0–17.0)
MCH: 26.3 pg (ref 26.0–34.0)
MCHC: 31.9 g/dL (ref 30.0–36.0)
MCV: 82.3 fL (ref 78.0–100.0)
Platelets: 180 10*3/uL (ref 150–400)
RBC: 3.16 MIL/uL — ABNORMAL LOW (ref 4.22–5.81)
RDW: 17.6 % — AB (ref 11.5–15.5)
WBC: 13.3 10*3/uL — AB (ref 4.0–10.5)

## 2015-03-26 LAB — PROTIME-INR
INR: 1.33 (ref 0.00–1.49)
Prothrombin Time: 16.7 seconds — ABNORMAL HIGH (ref 11.6–15.2)

## 2015-03-26 LAB — URINE MICROSCOPIC-ADD ON

## 2015-03-26 LAB — HEMOGLOBIN AND HEMATOCRIT, BLOOD
HEMATOCRIT: 18.2 % — AB (ref 39.0–52.0)
Hemoglobin: 5.6 g/dL — CL (ref 13.0–17.0)

## 2015-03-26 LAB — POC OCCULT BLOOD, ED: Fecal Occult Bld: POSITIVE — AB

## 2015-03-26 LAB — PREPARE RBC (CROSSMATCH)

## 2015-03-26 LAB — TROPONIN I: Troponin I: 0.07 ng/mL — ABNORMAL HIGH (ref <0.031)

## 2015-03-26 LAB — CBG MONITORING, ED: Glucose-Capillary: 140 mg/dL — ABNORMAL HIGH (ref 70–99)

## 2015-03-26 LAB — MRSA PCR SCREENING: MRSA BY PCR: NEGATIVE

## 2015-03-26 MED ORDER — ASPIRIN 81 MG PO CHEW
81.0000 mg | CHEWABLE_TABLET | Freq: Every day | ORAL | Status: DC
  Administered 2015-03-26: 81 mg via ORAL
  Filled 2015-03-26 (×2): qty 1

## 2015-03-26 MED ORDER — SODIUM CHLORIDE 0.9 % IV SOLN
Freq: Once | INTRAVENOUS | Status: AC
  Administered 2015-03-26: 15:00:00 via INTRAVENOUS

## 2015-03-26 MED ORDER — ENSURE ENLIVE PO LIQD
237.0000 mL | Freq: Two times a day (BID) | ORAL | Status: DC
  Administered 2015-04-01: 237 mL via ORAL

## 2015-03-26 MED ORDER — POTASSIUM CHLORIDE CRYS ER 20 MEQ PO TBCR
40.0000 meq | EXTENDED_RELEASE_TABLET | Freq: Once | ORAL | Status: AC
  Administered 2015-03-26: 40 meq via ORAL
  Filled 2015-03-26: qty 2

## 2015-03-26 MED ORDER — LABETALOL HCL 200 MG PO TABS
200.0000 mg | ORAL_TABLET | Freq: Two times a day (BID) | ORAL | Status: DC
  Administered 2015-03-26 (×2): 200 mg via ORAL
  Filled 2015-03-26 (×5): qty 1

## 2015-03-26 MED ORDER — VANCOMYCIN HCL IN DEXTROSE 1-5 GM/200ML-% IV SOLN
1000.0000 mg | INTRAVENOUS | Status: DC
  Administered 2015-03-26 – 2015-03-30 (×3): 1000 mg via INTRAVENOUS
  Filled 2015-03-26 (×3): qty 200

## 2015-03-26 MED ORDER — SODIUM CHLORIDE 0.9 % IV BOLUS (SEPSIS)
500.0000 mL | Freq: Once | INTRAVENOUS | Status: AC
  Administered 2015-03-26: 500 mL via INTRAVENOUS

## 2015-03-26 MED ORDER — HEPARIN SODIUM (PORCINE) 5000 UNIT/ML IJ SOLN: 5000.0000 [IU] | Freq: Three times a day (TID) | INTRAMUSCULAR | Status: DC

## 2015-03-26 MED ORDER — SODIUM CHLORIDE 0.9 % IV BOLUS (SEPSIS): 1000.0000 mL | Freq: Once | INTRAVENOUS | Status: DC

## 2015-03-26 MED ORDER — SODIUM CHLORIDE 0.9 % IV BOLUS (SEPSIS)
1000.0000 mL | Freq: Once | INTRAVENOUS | Status: AC
  Administered 2015-03-26: 1000 mL via INTRAVENOUS

## 2015-03-26 MED ORDER — PIPERACILLIN-TAZOBACTAM IN DEX 2-0.25 GM/50ML IV SOLN
2.2500 g | Freq: Four times a day (QID) | INTRAVENOUS | Status: DC
  Administered 2015-03-26 – 2015-03-29 (×13): 2.25 g via INTRAVENOUS
  Filled 2015-03-26 (×18): qty 50

## 2015-03-26 MED ORDER — FERROUS SULFATE 325 (65 FE) MG PO TABS
325.0000 mg | ORAL_TABLET | Freq: Every day | ORAL | Status: DC
  Administered 2015-03-26 – 2015-04-02 (×8): 325 mg via ORAL
  Filled 2015-03-26 (×8): qty 1

## 2015-03-26 MED ORDER — SODIUM CHLORIDE 0.9 % IV SOLN
INTRAVENOUS | Status: DC
  Administered 2015-03-26 – 2015-03-27 (×3): via INTRAVENOUS

## 2015-03-26 MED ORDER — MORPHINE SULFATE 2 MG/ML IJ SOLN
2.0000 mg | INTRAMUSCULAR | Status: DC | PRN
  Administered 2015-03-26 – 2015-03-28 (×2): 2 mg via INTRAVENOUS
  Filled 2015-03-26 (×2): qty 1

## 2015-03-26 NOTE — ED Notes (Signed)
Attempted in and out cath x1, unable to pass prostate. Applied condom cath

## 2015-03-26 NOTE — Progress Notes (Signed)
Utilization Review Completed.  

## 2015-03-26 NOTE — Significant Event (Signed)
CRITICAL VALUE ALERT  Critical value received:  Hgb 5.5  Date of notification:  03/26/2015  Time of notification:  1350  Critical value read back:Yes.    Nurse who received alert:  Juliann ParesJonna Mason RN BSN  MD notified (1st page):  Dr. Darnelle Catalanama  Time of first page:  1352  MD notified (2nd page):  Time of second page:  Responding MD:  Dr. Darnelle Catalanama  Time MD responded:  [phone]

## 2015-03-26 NOTE — Progress Notes (Signed)
Dr. Darnelle Catalanama notified of patients blood pressure, 1L NS bolus ordered and 1uPRBC.  Patient report that he "feels dizzy"  Able to communicate effectively.  Will continue to monitor closely.

## 2015-03-26 NOTE — Progress Notes (Signed)
CRITICAL VALUE ALERT  Critical value received: Lactic Acid 2.2  Date of notification:  03/26/2015   Time of notification:  0347  Critical value read back: yes  Nurse who received alert:  Birdena CrandallAmanda Cricket Goodlin RN   MD notified (1st page):    Time of first page:  3:54 AM   MD notified (2nd page):  Time of second page:  Responding MD:    Time MD responded:

## 2015-03-26 NOTE — Progress Notes (Signed)
ANTIBIOTIC CONSULT NOTE - INITIAL  Pharmacy Consult for Vancocin and Zosyn Indication: osteomyelitis and gangene  No Known Allergies  Patient Measurements: Height: 5\' 11"  (180.3 cm) Weight: 170 lb (77.111 kg) IBW/kg (Calculated) : 75.3  Vital Signs: Temp: 97.8 F (36.6 C) (05/06 0028) Temp Source: Oral (05/06 0028) BP: 122/78 mmHg (05/06 0300) Pulse Rate: 102 (05/06 0215)  Labs:  Recent Labs  03/26/15 0054  WBC 12.5*  HGB 7.2*  PLT 278  CREATININE 2.98*   Estimated Creatinine Clearance: 18.2 mL/min (by C-G formula based on Cr of 2.98).  Medical History: Past Medical History  Diagnosis Date  . Hypertension   . Chronic kidney disease   . Anemia of chronic disease   . Gout     Assessment: 81yo male c/o generalized fatigue, has gangrenous toes on bilateral feet w/ bones visible, on doxycycline PTA, concern for osteo, to begin IV ABX.  Goal of Therapy:  Vancomycin trough level 15-20 mcg/ml  Plan:  Will begin vancomycin 1000mg  IV Q48H and Zosyn 2.25g IV Q6H and monitor CBC, Cx, levels prn.  Vernard GamblesVeronda Haila Dena, PharmD, BCPS  03/26/2015,3:26 AM

## 2015-03-26 NOTE — ED Notes (Signed)
Per Dr. Julian ReilGardner, pt to be bladder scanned before inserting foley. If 1L or more is in bladder, insert foley. If less, pt can keep condom catheter in place.

## 2015-03-26 NOTE — Progress Notes (Signed)
VASCULAR LAB PRELIMINARY  ARTERIAL  ABI completed:    RIGHT    LEFT    PRESSURE WAVEFORM  PRESSURE WAVEFORM  BRACHIAL 128 triphasic BRACHIAL 118 triphasic  DP 83 Severely dampened monophasic DP 78 monophasic  AT   AT    PT 98 monophasic PT 77 monophasic  PER   PER    GREAT TOE  NA GREAT TOE  NA    RIGHT LEFT  ABI 0.77 0.61     Vincent Miranda, Vincent Miranda, RVT 03/26/2015, 1:21 PM

## 2015-03-26 NOTE — Consult Note (Addendum)
WOC review of chart per H&P with extensive gangrene and per wife exposed bone.  Plans for orthopedic consultation.  WOC will delay consultation at this time until orthopedic consultation has occurred.  Please re-consult if further assistance is needed.  Melody Overland ParkAustin RN, [place] [phone]

## 2015-03-26 NOTE — ED Notes (Signed)
Verbal okay by Dr. Julian ReilGardner to continue condom cath

## 2015-03-26 NOTE — H&P (Signed)
[facility] History and Physical  Vincent PigeonRobert T Mandato WJX:[id number] DOB: September 06, 1925 DOA: 03/26/2015  Referring physician: EDP PCP: Pcp Not In System   Chief Complaint: Generalized fatigue   HPI: Vincent PigeonRobert T Miranda is a 81 y.o. male with ongoing trouble with gangrene of toes of both feet for the past couple of months.  Currently on doxycycline as outpatient.  Patient presents to the ED with generalized weakness, fatigue, decreased PO intake, especially over the past 2-3 days.  Patient has a history of amputation of the great toe of the LEFT foot, and the area at the base of the stump became infected, gangrenous, and now has visible bone in the wound per wife as well as purulent drainage.  Last 2 toes on his RIGHT foot are also gangrenous and she (correctly) states "will likely require amputation" per his wife.  This has been ongoing for the past couple of months and wound care has been done by his wife and his PCP, however he developed the systemic symptoms noted above these past couple of days.  No melena, no blood in stool, no urinary retention.  Patient is normally up and active, its only today that he has been fatigued and altered.  Review of Systems: Systems reviewed.  As above, otherwise negative  Past Medical History  Diagnosis Date  . Hypertension   . Chronic kidney disease   . Anemia of chronic disease   . Gout    Past Surgical History  Procedure Laterality Date  . Eye surgery    . Amputation Left 10/19/2013    Procedure: AMPUTATION RAY  LEFT GREAT TOE;  Surgeon: Nadara MustardMarcus V Duda, MD;  Location: [facility] OR;  Service: Orthopedics;  Laterality: Left;   Social History:  reports that he has never smoked. He has never used smokeless tobacco. He reports that he does not drink alcohol or use illicit drugs.  No Known Allergies  No family history on file.   Prior to Admission medications   Medication Sig Start Date End Date Taking? Authorizing Provider  allopurinol (ZYLOPRIM) 100  MG tablet Take 200 mg by mouth daily.     Historical Provider, MD  aspirin 81 MG tablet Take 81 mg by mouth daily.    Historical Provider, MD  furosemide (LASIX) 40 MG tablet Take 40 mg by mouth 2 (two) times daily.     Historical Provider, MD  IRON PO Take 1 tablet by mouth daily.    Historical Provider, MD  labetalol (NORMODYNE) 100 MG tablet Take 2 tablets (200 mg total) by mouth 2 (two) times daily. 08/11/13   Kela MillinAdeline C Viyuoh, MD  POTASSIUM PO Take 1 tablet by mouth daily.    Historical Provider, MD   Physical Exam: Filed Vitals:   03/26/15 0300  BP: 122/78  Pulse:   Temp:   Resp: 22    BP 122/78 mmHg  Pulse 102  Temp(Src) 97.8 F (36.6 C) (Oral)  Resp 22  Ht 5\' 11"  (1.803 m)  Wt 77.111 kg (170 lb)  BMI 23.72 kg/m2  SpO2 100%  General Appearance:    Fatigued, oriented, no distress, appears stated age  Head:    Normocephalic, atraumatic  Eyes:    Oblong pupil on right from prior eye surgery, EOMI, sclera non-icteric        Nose:   Nares without drainage or epistaxis. Mucosa, turbinates normal  Throat:   Dry mucous membranes. Oropharynx without erythema or exudate.  Neck:   Supple. No carotid bruits.  No thyromegaly.  No lymphadenopathy.   Back:     No CVA tenderness, no spinal tenderness  Lungs:     Clear to auscultation bilaterally, without wheezes, rhonchi or rales  Chest wall:    No tenderness to palpitation  Heart:    Regular rate and rhythm without murmurs, gallops, rubs  Abdomen:     Soft, non-tender, nondistended, normal bowel sounds, no organomegaly  Genitalia:    deferred  Rectal:    deferred  Extremities:   No clubbing, cyanosis or edema.  Pulses:   2+ and symmetric all extremities  Skin:   Gangrenous necrosis to outer 2 toes on R foot, open wounds on dorsal surface of R foot.  Gangrenous necrosis to stump of great toe on L foot with open wound, purulent drainage, and even exposed bone.  Lymph nodes:   Cervical, supraclavicular, and axillary nodes normal   Neurologic:   CNII-XII intact. Normal strength, sensation and reflexes      throughout    Labs on Admission:  Basic Metabolic Panel:  Recent Labs Lab 03/26/15 0054  NA 147*  K 3.3*  CL 112*  CO2 21*  GLUCOSE 171*  BUN 101*  CREATININE 2.98*  CALCIUM 9.8   Liver Function Tests:  Recent Labs Lab 03/26/15 0054  AST 28  ALT 30  ALKPHOS 126  BILITOT 0.4  PROT 7.5  ALBUMIN 1.7*   No results for input(s): LIPASE, AMYLASE in the last 168 hours. No results for input(s): AMMONIA in the last 168 hours. CBC:  Recent Labs Lab 03/26/15 0054  WBC 12.5*  NEUTROABS 10.4*  HGB 7.2*  HCT 22.6*  MCV 78.2  PLT 278   Cardiac Enzymes: No results for input(s): CKTOTAL, CKMB, CKMBINDEX, TROPONINI in the last 168 hours.  BNP (last 3 results) No results for input(s): PROBNP in the last 8760 hours. CBG:  Recent Labs Lab 03/26/15 0126  GLUCAP 140*    Radiological Exams on Admission: Ct Head Wo Contrast  03/26/2015   CLINICAL DATA:  Weakness and generalized fatigue.  EXAM: CT HEAD WITHOUT CONTRAST  TECHNIQUE: Contiguous axial images were obtained from the base of the skull through the vertex without intravenous contrast.  COMPARISON:  None.  FINDINGS: There is no intracranial hemorrhage, mass or evidence of acute infarction. There is moderate generalized atrophy. There is moderate chronic microvascular ischemic change. There is no significant extra-axial fluid collection.  No acute intracranial findings are evident. The visible portions of the paranasal sinuses are clear.  IMPRESSION: Moderate generalized atrophy and chronic microvascular changes. No acute findings.   Electronically Signed   By: Ellery Plunkaniel R Mitchell M.D.   On: 03/26/2015 01:31   Dg Chest Port 1 View  03/26/2015   CLINICAL DATA:  Dyspnea, weakness  EXAM: PORTABLE CHEST - 1 VIEW  COMPARISON:  08/05/2013  FINDINGS: There is marked aortic tortuosity which appears unchanged. Heart size is normal and unchanged. The lungs are  clear. The pulmonary vasculature is normal. No pneumothorax or large effusion is evident.  IMPRESSION: No acute cardiopulmonary findings.   Electronically Signed   By: Ellery Plunkaniel R Mitchell M.D.   On: 03/26/2015 01:13    EKG: Independently reviewed.  Assessment/Plan Principal Problem:   Uremia Active Problems:   AKI (acute kidney injury)   Normocytic anemia   CKD (chronic kidney disease), stage III   Anemia of chronic disease   Dehydration with hypernatremia   Chronic blood loss anemia   Occult blood positive stool   Osteomyelitis of left foot   Gangrene  of foot   Severe sepsis with acute organ dysfunction   1. Gangrene of both feet causing sepsis with acute organ dysfunction - with osteomyelitis of the left toe stump at a minimum (exposed bone in wound), probably more extensive and involving R foot as well. 1. Given the limb threatening nature of these infections (and possibly life threatening too given that patient presents with end organ failure), will empirically treat with broad spectrum zosyn and vanc per pharmacy consult to start with. 2. BCx pending 3. Wound care consult 4. Needs ortho consult in AM, saw Dr. Lajoyce Corners for prior amputation. 5. Getting plain film X rays to help better clarify the extent of osteomyelitis involvement 2. Uremia - BUN > 100, likely the primary cause for his AMS today 1. will need to either resolve AKI quickly (see below), or call nephrology if this worsens to consider dialysis. 2. Repeat BMP at noon. 3. AKI on CKD stage 3 - appears to be pre-renal due to issue #1 and decreased PO intake above 1. Bladder scan showed a max of 250 cc urine in bladder and patient voided just before coming to ED so obstructive uropathy unlikely, though they were not able to place a foley in ED due to prostate. 2. Strict intake and output 3. Hold lasix 4. Daily BMP 5. IVF 4. Hyponatremia - likely due to dehydration, rehydrating patient with IVF, follow BMP. 5. Normocytic  anemia - multifactorial, due to a combination of chronic disease, and chronic GI blood loss as evidenced by his hemoccult positive stools. 1. Repeat HGB at noon, but doubt that this represents a fast bleed given lack of stigmata of GIB on presentation. 2. SCDs for DVT ppx given hemoccult positive stool 3. Likely needs GI follow up for hemoccult positive stool, or if he appears to develop evidence a significant acute GIB inpatient then will need transfusion and GI eval inpatient. 4. Continue home iron pills 5. Could also be causing his weakness, but feel that uremia is more likely, unless he drops hemoglobin significantly further or demonstrates stigmata of acute bleed, will hold off on transfusing for now and try and fix uremia and kidney issues first.    Code Status: Full Code Family Communication: Wife at bedside Disposition Plan: Admit to inpatient   Time spent: 70 min  Osei Anger M. [facility] Pager [phone]  If 7AM-7PM, please contact the day team taking care of the patient [email or web address] Password [email or web address] 03/26/2015, 3:25 AM

## 2015-03-26 NOTE — Progress Notes (Signed)
Initial Nutrition Assessment  DOCUMENTATION CODES:  Severe malnutrition in context of chronic illness  INTERVENTION:  Ensure Enlive (each supplement provides 350kcal and 20 grams of protein)  NUTRITION DIAGNOSIS:  Increased nutrient needs related to wound healing as evidenced by estimated needs.  GOAL:  Patient will meet greater than or equal to 90% of their needs  MONITOR:  PO intake, Supplement acceptance, Labs, Weight trends, Skin, I & O's  REASON FOR ASSESSMENT:  Malnutrition Screening Tool  ASSESSMENT: 81 y.o. Male with ongoing trouble with gangrene of toes of both feet for the past couple of months. Currently on doxycycline as outpatient. Patient presents to the ED with generalized weakness, fatigue, decreased PO intake, especially over the past 2-3 days  RD unable to obtain nutrition hx at this time.  Patient in VASCULAR LAB.  Seen per Clinical Nutrition during previous hospitalization.  Pt with hx of poor appetite.  Identified with malnutrition which is ongoing.  Likes Ensure supplements.  Ensure Enlive BID order in place.  Height:  Ht Readings from Last 1 Encounters:  03/26/15 5\' 11"  (1.803 m)    Weight:  Wt Readings from Last 1 Encounters:  03/26/15 146 lb 6.2 oz (66.4 kg)    Ideal Body Weight:  78 kg  Wt Readings from Last 10 Encounters:  03/26/15 146 lb 6.2 oz (66.4 kg)  10/18/13 173 lb 8 oz (78.699 kg)  08/05/13 172 lb 9.9 oz (78.3 kg)    BMI:  Body mass index is 20.43 kg/(m^2).  Estimated Nutritional Needs:  Kcal:  1700-1900  Protein:  90-100 gm  Fluid:  1.7-1.9 L  Skin:    bilateral foot ulcers  Diet Order:  Diet Carb Modified Fluid consistency:: Thin; Room service appropriate?: Yes  EDUCATION NEEDS:  No education needs identified at this time   Intake/Output Summary (Last 24 hours) at 03/26/15 1220 Last data filed at 03/26/15 1200  Gross per 24 hour  Intake 1462.5 ml  Output    500 ml  Net  962.5 ml    Last BM:   5/6  Maureen ChattersKatie Wesam Gearhart, RD, LDN Pager #: [phone] After-Hours Pager #: [phone]

## 2015-03-26 NOTE — ED Notes (Signed)
Pt arrives via EMS with generalized fatigue ongoing over the past week. States he thinks he almost passed out when standing to get into ambulance. Gangrenous toes on bilateral feet, bones visible. States he's on doxycyline for same.

## 2015-03-26 NOTE — Progress Notes (Addendum)
Progress Note   Vincent PigeonRobert T Tutton ION:[id number] DOB: 05-10-1925 DOA: 03/26/2015 PCP: Burtis JunesBLOUNT,ALVIN VINCENT, MD   Brief Narrative:   Vincent Miranda is an 81 y.o. male with a PMH of bilateral foot gangrene, prior history of amputation of the great toe on the left which has become infected again and now has exposed bone with purulent drainage as well as 2 toes on his right foot that are frankly gangrenous, treated with doxycycline as an outpatient, was admitted 03/26/15 with chief complaint of generalized fatigue.  Assessment/Plan:   Principal Problem:   Severe sepsis with acute organ dysfunction secondary to bilateral foot gangrene/osteomyelitis of left foot - Continue empiric antibiotics with vancomycin and Zosyn. - Follow-up blood cultures. - Prior surgery done by Dr. Lajoyce Cornersuda, will reconsult.  Left message with Dr. Audrie Liauda's RN. - Check ABIs. - Has required fluid volume resuscitation for hypotension.  Active Problems:   Hypertension - Continue labetalol.    Severe protein calorie malnutrition - Nutritional supplements ordered.    Troponin I elevation - Likely demand ischemia. - Cycle troponins. - Continue aspirin/beta blocker.    Hypokalemia - We'll give 40 mEq of potassium 1.    AKI (acute kidney injury) in the setting of stage III chronic kidney disease / uremia - Baseline creatinine 1.7-1.9. Current creatinine elevated at 2.98. Hydrate and monitor. - Likely prerenal.    Normocytic anemia/Anemia of chronic disease/chronic blood loss anemia/occult blood positive stool - Baseline hemoglobin appears to be around 8.3-8.6. Current hemoglobin lower than usual baseline values. - Trace heme positive stool noted. - Follow hemoglobin. Will give 1 unit of blood today given demand ischemia and hypotension.. - Continue oral iron therapy.    Dehydration with hypernatremia - Hydrating. Monitor sodium.    DVT Prophylaxis - Initiate after evaluation by surgery. Likely will need  surgical intervention.  Code Status: Full. Family Communication: Spouse, Chauncy PassyCarol Greenhaw called at 1:03 pm at [phone].  Message left. Disposition Plan: Home vs. SNF post operatively, likely in several days.   IV Access:    Peripheral IV   Procedures and diagnostic studies:   Ct Head Wo Contrast  03/26/2015   CLINICAL DATA:  Weakness and generalized fatigue.  EXAM: CT HEAD WITHOUT CONTRAST  TECHNIQUE: Contiguous axial images were obtained from the base of the skull through the vertex without intravenous contrast.  COMPARISON:  None.  FINDINGS: There is no intracranial hemorrhage, mass or evidence of acute infarction. There is moderate generalized atrophy. There is moderate chronic microvascular ischemic change. There is no significant extra-axial fluid collection.  No acute intracranial findings are evident. The visible portions of the paranasal sinuses are clear.  IMPRESSION: Moderate generalized atrophy and chronic microvascular changes. No acute findings.   Electronically Signed   By: Ellery Plunkaniel R Mitchell M.D.   On: 03/26/2015 01:31   Dg Chest Port 1 View  03/26/2015   CLINICAL DATA:  Dyspnea, weakness  EXAM: PORTABLE CHEST - 1 VIEW  COMPARISON:  08/05/2013  FINDINGS: There is marked aortic tortuosity which appears unchanged. Heart size is normal and unchanged. The lungs are clear. The pulmonary vasculature is normal. No pneumothorax or large effusion is evident.  IMPRESSION: No acute cardiopulmonary findings.   Electronically Signed   By: Ellery Plunkaniel R Mitchell M.D.   On: 03/26/2015 01:13   Dg Foot 2 Views Left  03/26/2015   CLINICAL DATA:  Gangrene  EXAM: LEFT FOOT - 2 VIEW  COMPARISON:  October 18, 2013  FINDINGS: Frontal and lateral views were  obtained. The patient has had amputation at the level of the medial cuneiform -first metatarsal junction. There is extensive soft tissue air in the medial aspect of the foot distally in the area of amputation. There is dislocation at the second MTP  joint site with the second proximal phalanx displaced laterally and proximal to the distal second metatarsal. There is also subluxation at the second PIP joint. No acute fracture seen. Other joint spaces appear intact. There is no overt bony destruction or erosion. There is soft tissue swelling dorsally with spurring in the dorsal midfoot. There is a small posterior calcaneal spur.  IMPRESSION: Soft tissue air medially and distally, consistent with infection. Amputation at the level of the medial cuneiform- first MTP joint without bony destruction in this area. Dislocation at the second MTP joint with remodeling of the second proximal phalanx. Subluxation at the second PIP joint. No overt bony destruction seen currently. Soft tissue swelling is noted somewhat diffusely. Areas of osteoarthritic change as described.   Electronically Signed   By: Bretta Bang III M.D.   On: 03/26/2015 07:43   Dg Foot 2 Views Right  03/26/2015   CLINICAL DATA:  81 year old male with a history of osteomyelitis and gangrene.  EXAM: RIGHT FOOT - 2 VIEW  COMPARISON:  None.  FINDINGS: No acute fracture line identified.  Rarefaction of the distal right fifth metatarsal with thinning of the overlying soft tissues.  Decreased bone density at the head of the right first metatarsal, in a region of prior osteomyelitis.  Diffuse osteopenia.  Developing Charcot changes of the hindfoot.  Calcifications in the distribution of the anterior tibial artery and posterior tibial artery.  IMPRESSION: Changes of osteomyelitis of the right fifth metatarsal with overlying soft tissue wound.  Questionable ongoing osteomyelitis of the right first metatarsal, in a region of prior infection.  Developing Charcot joint of the hindfoot.  Extensive vascular calcifications compatible with infrapopliteal disease. If the patient has not yet been evaluated for claudication/CLI, noninvasive testing with ABI, segmental duplex, and segmental pulse volume recording may  be considered as well as office based evaluation.  These results were called by telephone at the time of interpretation on 03/26/2015 at 7:52 am to Dr. Darnelle Catalan, who verbally acknowledged these results.  Signed,  Yvone Neu. Loreta Ave, DO  [facility]  [facility]   Electronically Signed   By: Gilmer Mor D.O.   On: 03/26/2015 07:52     Medical Consultants:    None.  Anti-Infectives:    Vancomycin 03/26/15--->  Zosyn 03/26/15--->  Subjective:   Vincent Pigeon denies significant pain to his feet.  No N/V.  Appetite poor.  No dyspnea.  Objective:    Filed Vitals:   03/26/15 1100 03/26/15 1142 03/26/15 1145 03/26/15 1200  BP: 109/59  Pulse: 72 70  69  Temp:      TempSrc:      Resp: Height:      Weight:      SpO2: 98% 100% 100%     Intake/Output Summary (Last 24 hours) at 03/26/15 1313 Last data filed at 03/26/15 1200  Gross per 24 hour  Intake 1462.5 ml  Output    500 ml  Net  962.5 ml    Exam: Gen:  NAD, awake/alert Cardiovascular:  RRR, No M/R/G Respiratory:  Lungs CTAB Gastrointestinal:  Abdomen soft, NT/ND, + BS Extremities:  Right foot gangrene, left great toe with open wound, exposed bone,  foul smelling purulent drainage   Data Reviewed:    Labs: Basic Metabolic Panel:  Recent Labs Lab 03/26/15 0054  NA 147*  K 3.3*  CL 112*  CO2 21*  GLUCOSE 171*  BUN 101*  CREATININE 2.98*  CALCIUM 9.8   GFR Estimated Creatinine Clearance: 16.1 mL/min (by C-G formula based on Cr of 2.98). Liver Function Tests:  Recent Labs Lab 03/26/15 0054  AST 28  ALT 30  ALKPHOS 126  BILITOT 0.4  PROT 7.5  ALBUMIN 1.7*   Coagulation profile  Recent Labs Lab 03/26/15 0152  INR 1.33    CBC:  Recent Labs Lab 03/26/15 0054  WBC 12.5*  NEUTROABS 10.4*  HGB 7.2*  HCT 22.6*  MCV 78.2  PLT 278   Cardiac Enzymes:  Recent Labs Lab 03/26/15 0258  TROPONINI 0.07*   CBG:  Recent  Labs Lab 03/26/15 0126  GLUCAP 140*   Sepsis Labs:  Recent Labs Lab 03/26/15 0054 03/26/15 0258 03/26/15 0532  WBC 12.5*  --   --   LATICACIDVEN  --  2.2* 3.0*   Microbiology Recent Results (from the past 240 hour(s))  MRSA PCR Screening     Status: None   Collection Time: 03/26/15  4:05 AM  Result Value Ref Range Status   MRSA by PCR NEGATIVE NEGATIVE Final    Comment:        The GeneXpert MRSA Assay (FDA approved for NASAL specimens only), is one component of a comprehensive MRSA colonization surveillance program. It is not intended to diagnose MRSA infection nor to guide or monitor treatment for MRSA infections.      Medications:   . sodium chloride   Intravenous Once  . aspirin  81 mg Oral Daily  . feeding supplement (ENSURE ENLIVE)  237 mL Oral BID BM  . ferrous sulfate  325 mg Oral Daily  . labetalol  200 mg Oral BID  . piperacillin-tazobactam (ZOSYN)  IV  2.25 g Intravenous Q6H  . sodium chloride  1,000 mL Intravenous Once  . sodium chloride  1,000 mL Intravenous Once  . vancomycin  1,000 mg Intravenous Q48H   Continuous Infusions: . sodium chloride 125 mL/hr at 03/26/15 1233    Time spent: 30 minutes.   LOS: 0 days   Oluwaseyi Raffel  [facility] Pager [phone]. If unable to reach me by pager, please call my cell phone at [phone].  *Please refer to [email or web address], password [email or web address] to get updated schedule on who will round on this patient, as hospitalists switch teams weekly. If 7PM-7AM, please contact night-coverage at [email or web address], password [email or web address] for any overnight needs.  03/26/2015, 1:13 PM

## 2015-03-26 NOTE — Consult Note (Signed)
Reason for Consult: Wet gangrene bilateral feet Referring Physician: Dr. Silvano Bilis Vincent Miranda is an 81 y.o. male.  HPI: Patient is an 80 year old gentleman with severe peripheral vascular disease who is status post limb salvage surgery on the left with amputation of the left great toe. Patient presents at this time with progressive necrosis of both lower extremities from the ankle distally with foul-smelling odor.  Past Medical History  Diagnosis Date  . Hypertension   . Chronic kidney disease   . Anemia of chronic disease   . Gout   . Acute blood loss anemia 10/18/2013    Past Surgical History  Procedure Laterality Date  . Eye surgery    . Amputation Left 10/19/2013    Procedure: AMPUTATION RAY  LEFT GREAT TOE;  Surgeon: Newt Minion, MD;  Location: [facility];  Service: Orthopedics;  Laterality: Left;    No family history on file.  Social History:  reports that he has never smoked. He has never used smokeless tobacco. He reports that he does not drink alcohol or use illicit drugs.  Allergies: No Known Allergies  Medications: I have reviewed the patient's current medications.  Results for orders placed or performed during the hospital encounter of 03/26/15 (from the past 48 hour(s))  Comprehensive metabolic panel     Status: Abnormal   Collection Time: 03/26/15 12:54 AM  Result Value Ref Range   Sodium 147 (H) 135 - 145 mmol/L   Potassium 3.3 (L) 3.5 - 5.1 mmol/L   Chloride 112 (H) 101 - 111 mmol/L   CO2 21 (L) 22 - 32 mmol/L   Glucose, Bld 171 (H) 70 - 99 mg/dL   BUN 101 (H) 6 - 20 mg/dL   Creatinine, Ser 2.98 (H) 0.61 - 1.24 mg/dL   Calcium 9.8 8.9 - 10.3 mg/dL   Total Protein 7.5 6.5 - 8.1 g/dL   Albumin 1.7 (L) 3.5 - 5.0 g/dL   AST 28 15 - 41 U/L   ALT 30 17 - 63 U/L   Alkaline Phosphatase 126 38 - 126 U/L   Total Bilirubin 0.4 0.3 - 1.2 mg/dL   GFR calc non Af Amer 17 (L) >60 mL/min   GFR calc Af Amer 20 (L) >60 mL/min    Comment: (NOTE) The eGFR has been  calculated using the CKD EPI equation. This calculation has not been validated in all clinical situations. eGFR's persistently <60 mL/min signify possible Chronic Kidney Disease.    Anion gap 14 5 - 15  CBC with Differential     Status: Abnormal   Collection Time: 03/26/15 12:54 AM  Result Value Ref Range   WBC 12.5 (H) 4.0 - 10.5 K/uL   RBC 2.89 (L) 4.22 - 5.81 MIL/uL   Hemoglobin 7.2 (L) 13.0 - 17.0 g/dL   HCT 22.6 (L) 39.0 - 52.0 %   MCV 78.2 78.0 - 100.0 fL   MCH 24.9 (L) 26.0 - 34.0 pg   MCHC 31.9 30.0 - 36.0 g/dL   RDW 17.2 (H) 11.5 - 15.5 %   Platelets 278 150 - 400 K/uL   Neutrophils Relative % 83 (H) 43 - 77 %   Neutro Abs 10.4 (H) 1.7 - 7.7 K/uL   Lymphocytes Relative 7 (L) 12 - 46 %   Lymphs Abs 0.8 0.7 - 4.0 K/uL   Monocytes Relative 10 3 - 12 %   Monocytes Absolute 1.2 (H) 0.1 - 1.0 K/uL   Eosinophils Relative 0 0 - 5 %  Eosinophils Absolute 0.0 0.0 - 0.7 K/uL   Basophils Relative 0 0 - 1 %   Basophils Absolute 0.0 0.0 - 0.1 K/uL  CBG monitoring, ED     Status: Abnormal   Collection Time: 03/26/15  1:26 AM  Result Value Ref Range   Glucose-Capillary 140 (H) 70 - 99 mg/dL  POC occult blood, ED RN will collect     Status: Abnormal   Collection Time: 03/26/15  1:32 AM  Result Value Ref Range   Fecal Occult Bld POSITIVE (A) NEGATIVE  Protime-INR     Status: Abnormal   Collection Time: 03/26/15  1:52 AM  Result Value Ref Range   Prothrombin Time 16.7 (H) 11.6 - 15.2 seconds   INR 1.33 0.00 - 1.49  Troponin I     Status: Abnormal   Collection Time: 03/26/15  2:58 AM  Result Value Ref Range   Troponin I 0.07 (H) <0.031 ng/mL    Comment:        PERSISTENTLY INCREASED TROPONIN VALUES IN THE RANGE OF 0.04-0.49 ng/mL CAN BE SEEN IN:       -UNSTABLE ANGINA       -CONGESTIVE HEART FAILURE       -MYOCARDITIS       -CHEST TRAUMA       -ARRYHTHMIAS       -LATE PRESENTING MYOCARDIAL INFARCTION       -COPD   CLINICAL FOLLOW-UP RECOMMENDED.   Lactic acid, plasma      Status: Abnormal   Collection Time: 03/26/15  2:58 AM  Result Value Ref Range   Lactic Acid, Venous 2.2 (HH) 0.5 - 2.0 mmol/L    Comment: REPEATED TO VERIFY CRITICAL RESULT CALLED TO, READ BACK BY AND VERIFIED WITH: A PETTERFORD,RN [id number] [place] ON 05/06 AT 0517: PREVIOUSLY REPORTED AS REPEATED TO VERIFY CRITICAL RESULT CALLED TO, READ BACK BY AND VERIFIED WITH: A PATTERFORD,RN [id number] [place]   MRSA PCR Screening     Status: None   Collection Time: 03/26/15  4:05 AM  Result Value Ref Range   MRSA by PCR NEGATIVE NEGATIVE    Comment:        The GeneXpert MRSA Assay (FDA approved for NASAL specimens only), is one component of a comprehensive MRSA colonization surveillance program. It is not intended to diagnose MRSA infection nor to guide or monitor treatment for MRSA infections.   Lactic acid, plasma     Status: Abnormal   Collection Time: 03/26/15  5:32 AM  Result Value Ref Range   Lactic Acid, Venous 3.0 (HH) 0.5 - 2.0 mmol/L    Comment: CRITICAL RESULT CALLED TO, READ BACK BY AND VERIFIED WITH: PETTIFORD A RN 03/26/15 0739 [place] B REPEATED TO VERIFY   Basic metabolic panel     Status: Abnormal   Collection Time: 03/26/15 12:49 PM  Result Value Ref Range   Sodium 149 (H) 135 - 145 mmol/L   Potassium 3.1 (L) 3.5 - 5.1 mmol/L   Chloride 118 (H) 101 - 111 mmol/L   CO2 23 22 - 32 mmol/L   Glucose, Bld 119 (H) 70 - 99 mg/dL   BUN 96 (H) 6 - 20 mg/dL   Creatinine, Ser 2.84 (H) 0.61 - 1.24 mg/dL   Calcium 9.4 8.9 - 10.3 mg/dL   GFR calc non Af Amer 18 (L) >60 mL/min   GFR calc Af Amer 21 (L) >60 mL/min    Comment: (NOTE) The eGFR has been calculated using the CKD EPI equation. This  calculation has not been validated in all clinical situations. eGFR's persistently <60 mL/min signify possible Chronic Kidney Disease.    Anion gap 8 5 - 15  Hemoglobin and hematocrit, blood     Status: Abnormal   Collection Time: 03/26/15 12:49 PM  Result Value Ref  Range   Hemoglobin 5.6 (LL) 13.0 - 17.0 g/dL    Comment: REPEATED TO VERIFY SPECIMEN CHECKED FOR CLOTS CRITICAL RESULT CALLED TO, READ BACK BY AND VERIFIED WITH: J MASON,RN AT 1350 03/26/15 BY K BARR    HCT 18.2 (L) 39.0 - 52.0 %  Prepare RBC     Status: None   Collection Time: 03/26/15  1:02 PM  Result Value Ref Range   Order Confirmation ORDER PROCESSED BY BLOOD BANK   Type and screen     Status: None (Preliminary result)   Collection Time: 03/26/15  1:02 PM  Result Value Ref Range   ABO/RH(D) O POS    Antibody Screen NEG    Sample Expiration 03/29/2015    Unit Number [id number]    Blood Component Type RED CELLS,LR    Unit division 00    Status of Unit ISSUED    Transfusion Status OK TO TRANSFUSE    Crossmatch Result Compatible    Unit Number [id number]    Blood Component Type RED CELLS,LR    Unit division 00    Status of Unit ISSUED    Transfusion Status OK TO TRANSFUSE    Crossmatch Result Compatible   Urinalysis, Routine w reflex microscopic     Status: Abnormal   Collection Time: 03/26/15  2:00 PM  Result Value Ref Range   Color, Urine YELLOW YELLOW   APPearance CLOUDY (A) CLEAR   Specific Gravity, Urine 1.013 1.005 - 1.030   pH 5.0 5.0 - 8.0   Glucose, UA NEGATIVE NEGATIVE mg/dL   Hgb urine dipstick NEGATIVE NEGATIVE   Bilirubin Urine NEGATIVE NEGATIVE   Ketones, ur NEGATIVE NEGATIVE mg/dL   Protein, ur 30 (A) NEGATIVE mg/dL   Urobilinogen, UA 0.2 0.0 - 1.0 mg/dL   Nitrite NEGATIVE NEGATIVE   Leukocytes, UA SMALL (A) NEGATIVE  Urine microscopic-add on     Status: None   Collection Time: 03/26/15  2:00 PM  Result Value Ref Range   Squamous Epithelial / LPF RARE RARE   WBC, UA 3-6 <3 WBC/hpf   RBC / HPF 0-2 <3 RBC/hpf   Bacteria, UA RARE RARE   Urine-Other MUCOUS PRESENT     Ct Head Wo Contrast  03/26/2015   CLINICAL DATA:  Weakness and generalized fatigue.  EXAM: CT HEAD WITHOUT CONTRAST  TECHNIQUE: Contiguous axial images were obtained from the base  of the skull through the vertex without intravenous contrast.  COMPARISON:  None.  FINDINGS: There is no intracranial hemorrhage, mass or evidence of acute infarction. There is moderate generalized atrophy. There is moderate chronic microvascular ischemic change. There is no significant extra-axial fluid collection.  No acute intracranial findings are evident. The visible portions of the paranasal sinuses are clear.  IMPRESSION: Moderate generalized atrophy and chronic microvascular changes. No acute findings.   Electronically Signed   By: Andreas Newport M.D.   On: 03/26/2015 01:31   Dg Chest Port 1 View  03/26/2015   CLINICAL DATA:  Dyspnea, weakness  EXAM: PORTABLE CHEST - 1 VIEW  COMPARISON:  08/05/2013  FINDINGS: There is marked aortic tortuosity which appears unchanged. Heart size is normal and unchanged. The lungs are clear. The pulmonary vasculature is normal. No  pneumothorax or large effusion is evident.  IMPRESSION: No acute cardiopulmonary findings.   Electronically Signed   By: Andreas Newport M.D.   On: 03/26/2015 01:13   Dg Foot 2 Views Left  03/26/2015   CLINICAL DATA:  Gangrene  EXAM: LEFT FOOT - 2 VIEW  COMPARISON:  October 18, 2013  FINDINGS: Frontal and lateral views were obtained. The patient has had amputation at the level of the medial cuneiform -first metatarsal junction. There is extensive soft tissue air in the medial aspect of the foot distally in the area of amputation. There is dislocation at the second MTP joint site with the second proximal phalanx displaced laterally and proximal to the distal second metatarsal. There is also subluxation at the second PIP joint. No acute fracture seen. Other joint spaces appear intact. There is no overt bony destruction or erosion. There is soft tissue swelling dorsally with spurring in the dorsal midfoot. There is a small posterior calcaneal spur.  IMPRESSION: Soft tissue air medially and distally, consistent with infection. Amputation at the  level of the medial cuneiform- first MTP joint without bony destruction in this area. Dislocation at the second MTP joint with remodeling of the second proximal phalanx. Subluxation at the second PIP joint. No overt bony destruction seen currently. Soft tissue swelling is noted somewhat diffusely. Areas of osteoarthritic change as described.   Electronically Signed   By: Lowella Grip III M.D.   On: 03/26/2015 07:43   Dg Foot 2 Views Right  03/26/2015   CLINICAL DATA:  81 year old male with a history of osteomyelitis and gangrene.  EXAM: RIGHT FOOT - 2 VIEW  COMPARISON:  None.  FINDINGS: No acute fracture line identified.  Rarefaction of the distal right fifth metatarsal with thinning of the overlying soft tissues.  Decreased bone density at the head of the right first metatarsal, in a region of prior osteomyelitis.  Diffuse osteopenia.  Developing Charcot changes of the hindfoot.  Calcifications in the distribution of the anterior tibial artery and posterior tibial artery.  IMPRESSION: Changes of osteomyelitis of the right fifth metatarsal with overlying soft tissue wound.  Questionable ongoing osteomyelitis of the right first metatarsal, in a region of prior infection.  Developing Charcot joint of the hindfoot.  Extensive vascular calcifications compatible with infrapopliteal disease. If the patient has not yet been evaluated for claudication/CLI, noninvasive testing with ABI, segmental duplex, and segmental pulse volume recording may be considered as well as office based evaluation.  These results were called by telephone at the time of interpretation on 03/26/2015 at 7:52 am to Dr. Rockne Menghini, who verbally acknowledged these results.  Signed,  Dulcy Fanny. Earleen Newport, DO  [facility]  [facility]   Electronically Signed   By: Corrie Mckusick D.O.   On: 03/26/2015 07:52    Review of Systems  All other systems reviewed and are negative.  Blood pressure 91/58, pulse 71,  temperature 97.7 F (36.5 C), temperature source Oral, resp. rate 15, height _0  (1.803 m), weight 66.4 kg (146 lb 6.2 oz), SpO2 99 %. Physical Exam On examination patient has purulent drainage from both feet with necrotic tissue foul-smelling odor exposed bone black gangrenous skin. Assessment/Plan: Assessment wet gangrene bilateral feet.  Plan: We will have patient stabilize prior to surgery he is currently receiving blood IV antibiotics. We will plan for bilateral transtibial amputations Sunday morning. This was discussed with the patient and his family risk and benefits were discussed with a understand and wish to  proceed with surgery.  DUDA,MARCUS V 03/26/2015, 6:37 PM

## 2015-03-26 NOTE — ED Provider Notes (Signed)
CSN: [id number]     Arrival date & time 03/26/15  0002 History   This chart was scribed for Pricilla LovelessScott Ama Mcmaster, MD by Freida Busmaniana Omoyeni, ED Scribe. This patient was seen in room B19C/B19C and the patient's care was started 12:04 AM.      Chief Complaint  Patient presents with  . Fatigue   The history is provided by the patient and the EMS personnel. No language interpreter was used.    HPI Comments:  Vincent Miranda is a 81 y.o. male brought in by ambulance from home, who presents to the Emergency Department complaining of generalized weakness and decreased appetitie for one week per EMS. Pt states he almost passed out this evening due to his weakness. He denies fever, nausea, vomiting, CP, SOB, HA, eye pain, vision changes and abdominal pain. EMS also notes pt is currently on doxycycline for a foot infection.  No alleviating factors noted.   Past Medical History  Diagnosis Date  . Hypertension   . Chronic kidney disease   . Anemia of chronic disease   . Gout    Past Surgical History  Procedure Laterality Date  . Eye surgery    . Amputation Left 10/19/2013    Procedure: AMPUTATION RAY  LEFT GREAT TOE;  Surgeon: Nadara MustardMarcus V Duda, MD;  Location: [facility];  Service: Orthopedics;  Laterality: Left;   No family history on file. History  Substance Use Topics  . Smoking status: Never Smoker   . Smokeless tobacco: Never Used  . Alcohol Use: No    Review of Systems  Constitutional: Positive for appetite change. Negative for fever.  Eyes: Negative for pain and visual disturbance.  Respiratory: Negative for shortness of breath.   Cardiovascular: Negative for chest pain.  Gastrointestinal: Negative for nausea, vomiting and abdominal pain.  Neurological: Positive for weakness (Generalized). Negative for headaches.  All other systems reviewed and are negative.     Allergies  Review of patient's allergies indicates no known allergies.  Home Medications   Prior to Admission medications    Medication Sig Start Date End Date Taking? Authorizing Provider  allopurinol (ZYLOPRIM) 100 MG tablet Take 200 mg by mouth daily.     Historical Provider, MD  aspirin 81 MG tablet Take 81 mg by mouth daily.    Historical Provider, MD  cephALEXin (KEFLEX) 500 MG capsule Take 1 capsule (500 mg total) by mouth 4 (four) times daily. 06/15/14   Imagene ShellerSteve Walton, MD  furosemide (LASIX) 40 MG tablet Take 40 mg by mouth 2 (two) times daily.     Historical Provider, MD  IRON PO Take 1 tablet by mouth daily.    Historical Provider, MD  labetalol (NORMODYNE) 100 MG tablet Take 2 tablets (200 mg total) by mouth 2 (two) times daily. 08/11/13   Kela MillinAdeline C Viyuoh, MD  POTASSIUM PO Take 1 tablet by mouth daily.    Historical Provider, MD   BP 112/90 mmHg  Pulse 106  Temp(Src) 97.8 F (36.6 C) (Oral)  Resp 19  Ht 5\' 11"  (1.803 m)  Wt 170 lb (77.111 kg)  BMI 23.72 kg/m2  SpO2 100% Physical Exam  Constitutional: He is oriented to person, place, and time. He appears well-developed and well-nourished.  HENT:  Head: Normocephalic and atraumatic.  Right Ear: External ear normal.  Left Ear: External ear normal.  Nose: Nose normal.  Eyes: Right eye exhibits no discharge. Left eye exhibits no discharge.  Oblong pupil on the right   Neck: Neck supple.  Cardiovascular:  Normal rate, regular rhythm, normal heart sounds and intact distal pulses.   Pulmonary/Chest: Effort normal.  Abdominal: Soft. There is no tenderness.  Musculoskeletal: He exhibits no edema.  Chronic appearing medial foot wounds bilaterally. No surrounding cellulitis but wounds are deep. No current drainage. Foul smelling  Neurological: He is alert and oriented to person, place, and time.  CN 2-12 grossly intact. 5/5 strength in all 4 extremities  Skin: Skin is warm and dry.  Nursing note and vitals reviewed.   ED Course  Procedures   DIAGNOSTIC STUDIES:  Oxygen Saturation is 100% on RA, normal by my interpretation.    COORDINATION OF  CARE:  12:13 AM Will order blood work, and UA. Discussed treatment plan with pt at bedside and pt agreed to plan.  Labs Review Labs Reviewed  COMPREHENSIVE METABOLIC PANEL - Abnormal; Notable for the following:    Sodium 147 (*)    Potassium 3.3 (*)    Chloride 112 (*)    CO2 21 (*)    Glucose, Bld 171 (*)    BUN 101 (*)    Creatinine, Ser 2.98 (*)    Albumin 1.7 (*)    GFR calc non Af Amer 17 (*)    GFR calc Af Amer 20 (*)    All other components within normal limits  CBC WITH DIFFERENTIAL/PLATELET - Abnormal; Notable for the following:    WBC 12.5 (*)    RBC 2.89 (*)    Hemoglobin 7.2 (*)    HCT 22.6 (*)    MCH 24.9 (*)    RDW 17.2 (*)    Neutrophils Relative % 83 (*)    Neutro Abs 10.4 (*)    Lymphocytes Relative 7 (*)    Monocytes Absolute 1.2 (*)    All other components within normal limits  PROTIME-INR - Abnormal; Notable for the following:    Prothrombin Time 16.7 (*)    All other components within normal limits  TROPONIN I - Abnormal; Notable for the following:    Troponin I 0.07 (*)    All other components within normal limits  LACTIC ACID, PLASMA - Abnormal; Notable for the following:    Lactic Acid, Venous 2.2 (*)    All other components within normal limits  CBG MONITORING, ED - Abnormal; Notable for the following:    Glucose-Capillary 140 (*)    All other components within normal limits  POC OCCULT BLOOD, ED - Abnormal; Notable for the following:    Fecal Occult Bld POSITIVE (*)    All other components within normal limits  MRSA PCR SCREENING  CULTURE, BLOOD (ROUTINE X 2)  CULTURE, BLOOD (ROUTINE X 2)  URINALYSIS, ROUTINE W REFLEX MICROSCOPIC  LACTIC ACID, PLASMA  BASIC METABOLIC PANEL  HEMOGLOBIN AND HEMATOCRIT, BLOOD    Imaging Review Ct Head Wo Contrast  03/26/2015   CLINICAL DATA:  Weakness and generalized fatigue.  EXAM: CT HEAD WITHOUT CONTRAST  TECHNIQUE: Contiguous axial images were obtained from the base of the skull through the vertex  without intravenous contrast.  COMPARISON:  None.  FINDINGS: There is no intracranial hemorrhage, mass or evidence of acute infarction. There is moderate generalized atrophy. There is moderate chronic microvascular ischemic change. There is no significant extra-axial fluid collection.  No acute intracranial findings are evident. The visible portions of the paranasal sinuses are clear.  IMPRESSION: Moderate generalized atrophy and chronic microvascular changes. No acute findings.   Electronically Signed   By: Ellery Plunkaniel R Mitchell M.D.   On: 03/26/2015 01:31  Dg Chest Port 1 View  03/26/2015   CLINICAL DATA:  Dyspnea, weakness  EXAM: PORTABLE CHEST - 1 VIEW  COMPARISON:  08/05/2013  FINDINGS: There is marked aortic tortuosity which appears unchanged. Heart size is normal and unchanged. The lungs are clear. The pulmonary vasculature is normal. No pneumothorax or large effusion is evident.  IMPRESSION: No acute cardiopulmonary findings.   Electronically Signed   By: Ellery Plunk M.D.   On: 03/26/2015 01:13     EKG Interpretation   Date/Time:  Friday Mar 26 2015 00:23:23 EDT Ventricular Rate:  104 PR Interval:  132 QRS Duration: 140 QT Interval:  374 QTC Calculation: 492 R Axis:   -95 Text Interpretation:  Sinus tachycardia Multiple premature complexes, vent   Right bundle branch block Consider inferior infarct RBBB appears new  compared to 2014 Confirmed by Nikai Quest  MD, Danarius Mcconathy ([id number]) on 03/26/2015  12:42:51 AM      MDM   Anemia Acute on Chronic Kidney injury.  Patient with nonspecific weakness and decreased oral intake over the past 1 week. Noted to have anemia which is likely contributing to his weakness. Also occult blood positive in his stool, does admit to one dark stool yesterday. Stool is dark brown here, no frank blood. Patient has no abdominal pain. No hypotension and vital signs otherwise unremarkable besides very mild tachycardia. Does have evidence of acute on chronic kidney  injury, given IV fluids. Feet appear acute on chronically infected, given broad antibiotics after discussion with hospitalist. We'll need admission for further workup of anemia, kidney injury, and foot infections.  I personally performed the services described in this documentation, which was scribed in my presence. The recorded information has been reviewed and is accurate.      Pricilla Loveless, MD 03/26/15 [id number]

## 2015-03-27 ENCOUNTER — Encounter (HOSPITAL_COMMUNITY): Payer: Self-pay | Admitting: Certified Registered"

## 2015-03-27 LAB — CBC
HCT: 22.8 % — ABNORMAL LOW (ref 39.0–52.0)
HEMOGLOBIN: 7.4 g/dL — AB (ref 13.0–17.0)
MCH: 26 pg (ref 26.0–34.0)
MCHC: 32.5 g/dL (ref 30.0–36.0)
MCV: 80 fL (ref 78.0–100.0)
Platelets: 175 10*3/uL (ref 150–400)
RBC: 2.85 MIL/uL — ABNORMAL LOW (ref 4.22–5.81)
RDW: 17.3 % — ABNORMAL HIGH (ref 11.5–15.5)
WBC: 12.6 10*3/uL — ABNORMAL HIGH (ref 4.0–10.5)

## 2015-03-27 LAB — BASIC METABOLIC PANEL
Anion gap: 8 (ref 5–15)
BUN: 83 mg/dL — AB (ref 6–20)
CHLORIDE: 122 mmol/L — AB (ref 101–111)
CO2: 19 mmol/L — ABNORMAL LOW (ref 22–32)
Calcium: 9 mg/dL (ref 8.9–10.3)
Creatinine, Ser: 2.59 mg/dL — ABNORMAL HIGH (ref 0.61–1.24)
GFR calc Af Amer: 24 mL/min — ABNORMAL LOW (ref >60)
GFR calc non Af Amer: 21 mL/min — ABNORMAL LOW (ref >60)
Glucose, Bld: 93 mg/dL (ref 70–99)
Potassium: 3.3 mmol/L — ABNORMAL LOW (ref 3.5–5.1)
Sodium: 149 mmol/L — ABNORMAL HIGH (ref 135–145)

## 2015-03-27 LAB — TROPONIN I
Troponin I: 0.06 ng/mL — ABNORMAL HIGH (ref <0.031)
Troponin I: 0.07 ng/mL — ABNORMAL HIGH (ref <0.031)
Troponin I: 0.07 ng/mL — ABNORMAL HIGH (ref <0.031)

## 2015-03-27 LAB — PREPARE RBC (CROSSMATCH)

## 2015-03-27 MED ORDER — POTASSIUM CHLORIDE IN NACL 20-0.45 MEQ/L-% IV SOLN
INTRAVENOUS | Status: DC
  Administered 2015-03-27: 1000 mL via INTRAVENOUS
  Filled 2015-03-27 (×5): qty 1000

## 2015-03-27 MED ORDER — SODIUM CHLORIDE 0.9 % IV SOLN: Freq: Once | INTRAVENOUS | Status: DC

## 2015-03-27 MED ORDER — POTASSIUM CHLORIDE CRYS ER 20 MEQ PO TBCR
40.0000 meq | EXTENDED_RELEASE_TABLET | Freq: Once | ORAL | Status: AC
  Administered 2015-03-27: 40 meq via ORAL
  Filled 2015-03-27: qty 2

## 2015-03-27 NOTE — Progress Notes (Addendum)
Progress Note   Vincent PigeonRobert T Morawski JYN:[id number] DOB: 11/12/1925 DOA: 03/26/2015 PCP: Burtis JunesBLOUNT,ALVIN VINCENT, MD   Brief Narrative:   Vincent Miranda is an 81 y.o. male with a PMH of bilateral foot gangrene, prior history of amputation of the great toe on the left which has become infected again and now has exposed bone with purulent drainage as well as 2 toes on his right foot that are frankly gangrenous, treated with doxycycline as an outpatient, was admitted 03/26/15 with chief complaint of generalized fatigue. Orthopedic surgery has been consulted with plans for transtibial amputations 03/28/15. Patient also has GI bleeding and has required 2 units of blood during hospital stay. Patient's wife has indicated that she does not want her husband to have transtibial amputations and despite multiple attempts to educate her, continues to insist that he can heal his wounds since he has done so in the past.  Assessment/Plan:   Principal Problem:   Severe sepsis with acute organ dysfunction secondary to bilateral foot gangrene/osteomyelitis of left foot - Continue empiric antibiotics with vancomycin and Zosyn. - Follow-up blood cultures. - Seen by Dr. Lajoyce Cornersuda 03/26/15, with plans for transtibial amputations 03/28/15, although it is doubtful that he will consent. - Follow-up ABIs. - Has required fluid volume resuscitation for hypotension. Currently hemodynamically stable.  Active Problems:   Hypertension - Labetalol on hold secondary to low blood pressure.    Severe protein calorie malnutrition - Nutritional supplements ordered.    Troponin I elevation - Likely demand ischemia. - Troponins mildly elevated. - Hold aspirin given GI bleeding. Resume beta blocker when blood pressure stable.    Hypokalemia - We'll give 40 mEq of potassium 1 and add potassium to IV fluids.    AKI (acute kidney injury) in the setting of stage III chronic kidney disease / uremia - Baseline creatinine 1.7-1.9.  Creatinine 2.98 ---> 2.59 with IV fluids. - Likely prerenal, continue to hydrate.    Normocytic anemia/Anemia of chronic disease/chronic blood loss anemia/occult blood positive stool - Baseline hemoglobin appears to be around 8.3-8.6. Current hemoglobin lower than usual baseline values. - Stools heme positive. - Received 2 units of PRBCs 03/26/15 with post transfusion hemoglobin of 8.3. Hemoglobin dropped to 7.4 this morning. - We'll give another unit of PRBCs. - Continue oral iron therapy. - We'll need GI consultation when stable. Hold aspirin.    Dehydration with hypernatremia - Hydrating. Monitor sodium.    DVT Prophylaxis - Initiate after evaluation by surgery. Likely will need surgical intervention.  Code Status: Full. Family Communication: Spouse, Chauncy PassyCarol Medearis, updated at the bedside but she is adamant that she does not want her husband to have bilateral transtibial amputations and thinks that he can heal his wounds with good nursing care. Disposition Plan: Home vs. SNF post operatively, likely in several days.   IV Access:    Peripheral IV   Procedures and diagnostic studies:   Ct Head Wo Contrast  03/26/2015   CLINICAL DATA:  Weakness and generalized fatigue.  EXAM: CT HEAD WITHOUT CONTRAST  TECHNIQUE: Contiguous axial images were obtained from the base of the skull through the vertex without intravenous contrast.  COMPARISON:  None.  FINDINGS: There is no intracranial hemorrhage, mass or evidence of acute infarction. There is moderate generalized atrophy. There is moderate chronic microvascular ischemic change. There is no significant extra-axial fluid collection.  No acute intracranial findings are evident. The visible portions of the paranasal sinuses are clear.  IMPRESSION: Moderate generalized atrophy and chronic microvascular  changes. No acute findings.   Electronically Signed   By: Ellery Plunk M.D.   On: 03/26/2015 01:31   Dg Chest Port 1 View  03/26/2015    CLINICAL DATA:  Dyspnea, weakness  EXAM: PORTABLE CHEST - 1 VIEW  COMPARISON:  08/05/2013  FINDINGS: There is marked aortic tortuosity which appears unchanged. Heart size is normal and unchanged. The lungs are clear. The pulmonary vasculature is normal. No pneumothorax or large effusion is evident.  IMPRESSION: No acute cardiopulmonary findings.   Electronically Signed   By: Ellery Plunk M.D.   On: 03/26/2015 01:13   Dg Foot 2 Views Left  03/26/2015   CLINICAL DATA:  Gangrene  EXAM: LEFT FOOT - 2 VIEW  COMPARISON:  October 18, 2013  FINDINGS: Frontal and lateral views were obtained. The patient has had amputation at the level of the medial cuneiform -first metatarsal junction. There is extensive soft tissue air in the medial aspect of the foot distally in the area of amputation. There is dislocation at the second MTP joint site with the second proximal phalanx displaced laterally and proximal to the distal second metatarsal. There is also subluxation at the second PIP joint. No acute fracture seen. Other joint spaces appear intact. There is no overt bony destruction or erosion. There is soft tissue swelling dorsally with spurring in the dorsal midfoot. There is a small posterior calcaneal spur.  IMPRESSION: Soft tissue air medially and distally, consistent with infection. Amputation at the level of the medial cuneiform- first MTP joint without bony destruction in this area. Dislocation at the second MTP joint with remodeling of the second proximal phalanx. Subluxation at the second PIP joint. No overt bony destruction seen currently. Soft tissue swelling is noted somewhat diffusely. Areas of osteoarthritic change as described.   Electronically Signed   By: Bretta Bang III M.D.   On: 03/26/2015 07:43   Dg Foot 2 Views Right  03/26/2015   CLINICAL DATA:  81 year old male with a history of osteomyelitis and gangrene.  EXAM: RIGHT FOOT - 2 VIEW  COMPARISON:  None.  FINDINGS: No acute fracture line  identified.  Rarefaction of the distal right fifth metatarsal with thinning of the overlying soft tissues.  Decreased bone density at the head of the right first metatarsal, in a region of prior osteomyelitis.  Diffuse osteopenia.  Developing Charcot changes of the hindfoot.  Calcifications in the distribution of the anterior tibial artery and posterior tibial artery.  IMPRESSION: Changes of osteomyelitis of the right fifth metatarsal with overlying soft tissue wound.  Questionable ongoing osteomyelitis of the right first metatarsal, in a region of prior infection.  Developing Charcot joint of the hindfoot.  Extensive vascular calcifications compatible with infrapopliteal disease. If the patient has not yet been evaluated for claudication/CLI, noninvasive testing with ABI, segmental duplex, and segmental pulse volume recording may be considered as well as office based evaluation.  These results were called by telephone at the time of interpretation on 03/26/2015 at 7:52 am to Dr. Darnelle Catalan, who verbally acknowledged these results.  Signed,  Yvone Neu. Loreta Ave, DO  [facility]  [facility]   Electronically Signed   By: Gilmer Mor D.O.   On: 03/26/2015 07:52     Medical Consultants:    None.  Anti-Infectives:    Vancomycin 03/26/15--->  Zosyn 03/26/15--->  Subjective:   Vincent Miranda.  Having paroxysms of cough after choking on his pills.  He tells  me he doesn't want to have his legs amputated, has poor health care literacy despite an explanation as to why this is needed.  Objective:    Filed Vitals:   03/27/15 0917 03/27/15 0957 03/27/15 1015 03/27/15 1031  BP: 113/52 128/66  130/60  Pulse: 71     Temp: 98 F (36.7 C) 97.9 F (36.6 C) 98.1 F (36.7 C) 98.1 F (36.7 C)  TempSrc: Oral Oral  Oral  Resp: 16     Height:      Weight:      SpO2: 99% 99% 99% 99%    Intake/Output Summary (Last 24 hours) at  03/27/15 1124 Last data filed at 03/27/15 0827  Gross per 24 hour  Intake 2752.5 ml  Output    975 ml  Net 1777.5 ml    Exam: Gen:  NAD, awake/alert Cardiovascular:  RRR, No M/R/G Respiratory:  Lungs CTAB Gastrointestinal:  Abdomen soft, NT/ND, + BS Extremities:  As pictured below, drainage very foul smelling Right lateral foot:  Right medial foot:  Left medial foot with exposed bone  Left foot:     Data Reviewed:    Labs: Basic Metabolic Panel:  Recent Labs Lab 03/26/15 0054 03/26/15 1249 03/27/15 0316  NA 147* 149* 149*  K 3.3* 3.1* 3.3*  CL 112* 118* 122*  CO2 21* 23 19*  GLUCOSE 171* 119* 93  BUN 101* 96* 83*  CREATININE 2.98* 2.84* 2.59*  CALCIUM 9.8 9.4 9.0   GFR Estimated Creatinine Clearance: 18.5 mL/min (by C-G formula based on Cr of 2.59). Liver Function Tests:  Recent Labs Lab 03/26/15 0054  AST 28  ALT 30  ALKPHOS 126  BILITOT 0.4  PROT 7.5  ALBUMIN 1.7*   Coagulation profile  Recent Labs Lab 03/26/15 0152  INR 1.33    CBC:  Recent Labs Lab 03/26/15 0054 03/26/15 1249 03/26/15 2259 03/27/15 0316  WBC 12.5*  --  13.3* 12.6*  NEUTROABS 10.4*  --   --   --   HGB 7.2* 5.6* 8.3* 7.4*  HCT 22.6* 18.2* 26.0* 22.8*  MCV 78.2  --  82.3 80.0  PLT 278  --  180 175   Cardiac Enzymes:  Recent Labs Lab 03/26/15 0258 03/27/15 0815  TROPONINI 0.07* 0.06*   CBG:  Recent Labs Lab 03/26/15 0126  GLUCAP 140*   Sepsis Labs:  Recent Labs Lab 03/26/15 0054 03/26/15 0258 03/26/15 0532 03/26/15 2259 03/27/15 0316  WBC 12.5*  --   --  13.3* 12.6*  LATICACIDVEN  --  2.2* 3.0*  --   --    Microbiology Recent Results (from the past 240 hour(s))  Culture, blood (routine x 2)     Status: None (Preliminary result)   Collection Time: 03/26/15 12:57 AM  Result Value Ref Range Status   Specimen Description BLOOD LEFT ARM  Final   Special Requests BOTTLES DRAWN AEROBIC ONLY 7CC  Final   Culture   Final           BLOOD  CULTURE RECEIVED NO GROWTH TO DATE CULTURE WILL BE HELD FOR 5 DAYS BEFORE ISSUING A FINAL NEGATIVE REPORT Performed at [facility]    Report Status PENDING  Incomplete  MRSA PCR Screening     Status: None   Collection Time: 03/26/15  4:05 AM  Result Value Ref Range Status   MRSA by PCR NEGATIVE NEGATIVE Final    Comment:        The GeneXpert MRSA Assay (FDA approved for NASAL specimens  only), is one component of a comprehensive MRSA colonization surveillance program. It is not intended to diagnose MRSA infection nor to guide or monitor treatment for MRSA infections.   Culture, blood (routine x 2)     Status: None (Preliminary result)   Collection Time: 03/26/15  5:32 AM  Result Value Ref Range Status   Specimen Description BLOOD LEFT HAND  Final   Special Requests BOTTLES DRAWN AEROBIC ONLY 5CC  Final   Culture   Final           BLOOD CULTURE RECEIVED NO GROWTH TO DATE CULTURE WILL BE HELD FOR 5 DAYS BEFORE ISSUING A FINAL NEGATIVE REPORT Performed at [facility]    Report Status PENDING  Incomplete     Medications:   . sodium chloride   Intravenous Once  . feeding supplement (ENSURE ENLIVE)  237 mL Oral BID BM  . ferrous sulfate  325 mg Oral Daily  . piperacillin-tazobactam (ZOSYN)  IV  2.25 g Intravenous Q6H  . sodium chloride  1,000 mL Intravenous Once  . vancomycin  1,000 mg Intravenous Q48H   Continuous Infusions: . 0.45 % NaCl with KCl 20 mEq / L 1,000 mL (03/27/15 1041)    Time spent: 35 minutes with > 50% of time discussing current diagnostic test results, clinical impression and plan of care, including a very detailed explanation as to why amputation is needed.  Despite this, patient does not seem to understand. I also returned later in the day to speak with his wife who also continues to insist that he can heal his wounds.   LOS: 1 day   Stevie Charter  [facility] Pager [phone]. If unable to reach me by pager, please call my cell  phone at [phone].  *Please refer to [email or web address], password [email or web address] to get updated schedule on who will round on this patient, as hospitalists switch teams weekly. If 7PM-7AM, please contact night-coverage at [email or web address], password [email or web address] for any overnight needs.  03/27/2015, 11:24 AM

## 2015-03-27 NOTE — Progress Notes (Signed)
Called Dr. Lajoyce Cornersuda to inform him that patient and wife will not sign consent for his surgery in the am. After Dr. Lajoyce Cornersuda spoke with the wife they wish to go home on abx's.

## 2015-03-27 NOTE — Progress Notes (Signed)
Dr. Darnelle Catalanama in to talk with wife regarding pending surgery. The wife would not consent to signing consent for surgery.

## 2015-03-27 NOTE — Progress Notes (Signed)
In room and discussed extensively with  Patient and he  voiced concerns that he did not want his feet to be cut off. States I can" wash it and keep it clean.  " I do not understand why they want to cut my feet off".

## 2015-03-27 NOTE — Progress Notes (Signed)
Dr. Lajoyce Cornersuda at bedside; wet to dry dressing done bil feet.

## 2015-03-27 NOTE — Progress Notes (Signed)
The wife/husband both in agreement for the scheduled surgery not to be done. Wife became teary-eyed and said they have been married for 50 years and she did not want anything to happen to him.

## 2015-03-28 ENCOUNTER — Encounter (HOSPITAL_COMMUNITY)
Admission: EM | Disposition: A | Discharge status: Home / Self Care | Payer: Self-pay | Source: Home / Self Care | Attending: Internal Medicine

## 2015-03-28 DIAGNOSIS — I739 Peripheral vascular disease, unspecified: Secondary | ICD-10-CM | POA: Diagnosis present

## 2015-03-28 DIAGNOSIS — B9689 Other specified bacterial agents as the cause of diseases classified elsewhere: Secondary | ICD-10-CM

## 2015-03-28 DIAGNOSIS — Z89412 Acquired absence of left great toe: Secondary | ICD-10-CM

## 2015-03-28 DIAGNOSIS — M86171 Other acute osteomyelitis, right ankle and foot: Secondary | ICD-10-CM

## 2015-03-28 DIAGNOSIS — I70269 Atherosclerosis of native arteries of extremities with gangrene, unspecified extremity: Secondary | ICD-10-CM

## 2015-03-28 LAB — TYPE AND SCREEN
ABO/RH(D): O POS
ANTIBODY SCREEN: NEGATIVE
Unit division: 0
Unit division: 0
Unit division: 0

## 2015-03-28 LAB — CBC
HCT: 26.9 % — ABNORMAL LOW (ref 39.0–52.0)
HEMOGLOBIN: 8.7 g/dL — AB (ref 13.0–17.0)
MCH: 26.7 pg (ref 26.0–34.0)
MCHC: 32.3 g/dL (ref 30.0–36.0)
MCV: 82.5 fL (ref 78.0–100.0)
PLATELETS: 169 10*3/uL (ref 150–400)
RBC: 3.26 MIL/uL — ABNORMAL LOW (ref 4.22–5.81)
RDW: 17.4 % — AB (ref 11.5–15.5)
WBC: 11.8 10*3/uL — ABNORMAL HIGH (ref 4.0–10.5)

## 2015-03-28 LAB — BASIC METABOLIC PANEL
Anion gap: 7 (ref 5–15)
BUN: 60 mg/dL — AB (ref 6–20)
CALCIUM: 9.2 mg/dL (ref 8.9–10.3)
CO2: 20 mmol/L — ABNORMAL LOW (ref 22–32)
Chloride: 125 mmol/L — ABNORMAL HIGH (ref 101–111)
Creatinine, Ser: 2.36 mg/dL — ABNORMAL HIGH (ref 0.61–1.24)
GFR calc Af Amer: 27 mL/min — ABNORMAL LOW (ref >60)
GFR calc non Af Amer: 23 mL/min — ABNORMAL LOW (ref >60)
GLUCOSE: 100 mg/dL — AB (ref 70–99)
POTASSIUM: 4 mmol/L (ref 3.5–5.1)
Sodium: 152 mmol/L — ABNORMAL HIGH (ref 135–145)

## 2015-03-28 SURGERY — AMPUTATION BELOW KNEE
Anesthesia: General | Laterality: Bilateral

## 2015-03-28 MED ORDER — PANTOPRAZOLE SODIUM 40 MG PO TBEC
40.0000 mg | DELAYED_RELEASE_TABLET | Freq: Two times a day (BID) | ORAL | Status: DC
  Administered 2015-03-28 – 2015-04-02 (×10): 40 mg via ORAL
  Filled 2015-03-28 (×10): qty 1

## 2015-03-28 MED ORDER — DEXTROSE 5 % IV SOLN
INTRAVENOUS | Status: DC
  Administered 2015-03-28: 1000 mL via INTRAVENOUS
  Administered 2015-03-29 – 2015-04-01 (×8): via INTRAVENOUS

## 2015-03-28 MED ORDER — PANTOPRAZOLE SODIUM 40 MG PO TBEC: 40.0000 mg | DELAYED_RELEASE_TABLET | Freq: Every day | ORAL | Status: DC

## 2015-03-28 NOTE — Progress Notes (Signed)
Patient ID: Vincent PigeonRobert T Miranda, male   DOB: [birth year], 81 y.o.   MRN: [id number]         [facility]    Date of Admission:  03/26/2015   Total days of antibiotics 3              Reason for Consult:atherosclerotic peripheral vascular disease complicated by bilateral gangrene of feet    Referring Physician: Dr. Thayer Ohmhris Rama Primary Care Physician: Dr. Clyda GreenerAlvin Blount  Principal Problem:   Atherosclerotic peripheral vascular disease with gangrene Active Problems:   Osteomyelitis of left foot   Gangrene of foot   Severe sepsis with acute organ dysfunction   Occult blood positive stool   AKI (acute kidney injury)   Protein-calorie malnutrition, severe   Hypertension   CKD (chronic kidney disease), stage III   Anemia of chronic disease   Dehydration with hypernatremia   Uremia   Chronic blood loss anemia   Elevated troponin   . feeding supplement (ENSURE ENLIVE)  237 mL Oral BID BM  . ferrous sulfate  325 mg Oral Daily  . pantoprazole  40 mg Oral BID  . piperacillin-tazobactam (ZOSYN)  IV  2.25 g Intravenous Q6H  . sodium chloride  1,000 mL Intravenous Once  . vancomycin  1,000 mg Intravenous Q48H    Recommendations: 1. Continue vancomycin and piperacillin tazobactam for now pending further discussions about treatment options with his wife is present   Assessment: He has extensive bilateral foot wounds with gangrene and osteomyelitis. At this point he is refusing surgery. He states repeatedly that he does not understand why surgery was recommended. He states that his wounds have healed in the past and he believes they could heal again. I told him that I did not believe that his problem was curable with antibiotics alone. He says that he could accept that in hopes that they will slow the process down. I tried to talk to him about various antibiotic options (oral versus IV). He states that he would rather have his wife present before he makes any decisions. I  will have Dr. Ninetta LightsHatcher followup tomorrow.    HPI: Vincent Miranda is a 81 y.o. male with atherosclerosis who underwent left great toe amputation several years ago. He states that although he was told that his wound would probably not heal after that surgery he did. He has difficulty quantitating exactly when he began getting worse recently but it sounds like he's had worsening bilateral foot wounds for several months. He states that Dr. Bruna PotterBlount started him on doxycycline recently. He is been having loss of appetite and fatigue recently leading to his admission on 03/26/2015. He has extensive wounds on both feet with purulent drainage and foul odor. There is evidence of gangrene. He has exposed bone in his left foot medially and evidence of osteomyelitis on plain films of his right foot. He was seen by Dr. Lajoyce Cornersuda 2 days ago who recommended bilateral transtibial amputations. After discussing it with his wife, they decided against surgery. He tells me repeatedly that no one has told him what is going on. He states he feels that he was asked to make a decision about surgery without time to think about it. Admission blood cultures are negative. [facility] called [facility]. They do not have any records of any wound cultures.   Review of Systems: Review of systems not obtained due to patient factors.  Past Medical History  Diagnosis Date  . Hypertension   . Chronic  kidney disease   . Anemia of chronic disease   . Gout   . Acute blood loss anemia 10/18/2013    History  Substance Use Topics  . Smoking status: Never Smoker   . Smokeless tobacco: Never Used  . Alcohol Use: No    No family history on file. No Known Allergies  OBJECTIVE: Blood pressure 117/59, pulse 64, temperature 98.5 F (36.9 C), temperature source Oral, resp. rate 24, height 5\' 11"  (1.803 m), weight 146 lb 6.2 oz (66.4 kg), SpO2 99 %. General: he is alert and in no distress. At times he seemed somewhat confused and forgetful. He  states that he wishes his wife were present because she can answer my questions. He is thin with temporal wasting Skin: no rash Lungs: clear Cor: distant but regular S1 and S2 with no murmurs heard Abdomen: soft and nontender Extensive bilateral foot wounds with blackened toes and thin, malodorous drainage  Lab Results Lab Results  Component Value Date   WBC 11.8* 03/28/2015   HGB 8.7* 03/28/2015   HCT 26.9* 03/28/2015   MCV 82.5 03/28/2015   PLT 169 03/28/2015    Lab Results  Component Value Date   CREATININE 2.36* 03/28/2015   BUN 60* 03/28/2015   NA 152* 03/28/2015   K 4.0 03/28/2015   CL 125* 03/28/2015   CO2 20* 03/28/2015    Lab Results  Component Value Date   ALT 30 03/26/2015   AST 28 03/26/2015   ALKPHOS 126 03/26/2015   BILITOT 0.4 03/26/2015     Microbiology: Recent Results (from the past 240 hour(s))  Culture, blood (routine x 2)     Status: None (Preliminary result)   Collection Time: 03/26/15 12:57 AM  Result Value Ref Range Status   Specimen Description BLOOD LEFT ARM  Final   Special Requests BOTTLES DRAWN AEROBIC ONLY 7CC  Final   Culture   Final           BLOOD CULTURE RECEIVED NO GROWTH TO DATE CULTURE WILL BE HELD FOR 5 DAYS BEFORE ISSUING A FINAL NEGATIVE REPORT Performed at [facility]    Report Status PENDING  Incomplete  MRSA PCR Screening     Status: None   Collection Time: 03/26/15  4:05 AM  Result Value Ref Range Status   MRSA by PCR NEGATIVE NEGATIVE Final    Comment:        The GeneXpert MRSA Assay (FDA approved for NASAL specimens only), is one component of a comprehensive MRSA colonization surveillance program. It is not intended to diagnose MRSA infection nor to guide or monitor treatment for MRSA infections.   Culture, blood (routine x 2)     Status: None (Preliminary result)   Collection Time: 03/26/15  5:32 AM  Result Value Ref Range Status   Specimen Description BLOOD LEFT HAND  Final   Special Requests  BOTTLES DRAWN AEROBIC ONLY 5CC  Final   Culture   Final           BLOOD CULTURE RECEIVED NO GROWTH TO DATE CULTURE WILL BE HELD FOR 5 DAYS BEFORE ISSUING A FINAL NEGATIVE REPORT Performed at [facility]    Report Status PENDING  Incomplete    Cliffton AstersJohn Jamisyn Langer, MD [facility] [phone] pager   [phone] cell 03/28/2015, 11:46 AM

## 2015-03-28 NOTE — Progress Notes (Addendum)
Progress Note   Vincent PigeonRobert T Miranda ZOX:[id number] DOB: [birth year] DOA: 03/26/2015 PCP: Vincent Miranda   Brief Narrative:   Vincent Miranda is an 81 y.o. male with a PMH of bilateral foot gangrene, prior history of amputation of the great toe on the left which has become infected again and now has exposed bone with purulent drainage as well as 2 toes on his right foot that are frankly gangrenous, treated with doxycycline as an outpatient, was admitted 03/26/15 with chief complaint of generalized fatigue. Orthopedic surgery has been consulted with plans for transtibial amputations 03/28/15. Patient also has GI bleeding and has required 2 units of blood during hospital stay. Patient's wife has indicated that she does not want her husband to have transtibial amputations and despite multiple attempts to educate her, continues to insist that he can heal his wounds since he has done so in the past.  Assessment/Plan:   Principal Problem:   Severe sepsis with acute organ dysfunction secondary to bilateral foot gangrene/osteomyelitis of left foot - Continue empiric antibiotics with vancomycin and Zosyn. - Follow-up blood cultures. - Seen by Dr. Lajoyce Cornersuda 03/26/15, who recommended transtibial amputations however patient would not consent. - ABIs performed showing mild reduction in arterial flow on the right and moderate reduction in arterial flow on the left. - Has required fluid volume resuscitation for hypotension. Currently hemodynamically stable. - Given refusal for operative management despite an extensive explanation of why surgery is needed, will ask ID to consult   for antibiotic recommendations.  Active Problems:   Hypertension - Labetalol remains on hold. Blood pressure stable.    Severe protein calorie malnutrition - Nutritional supplements ordered.    Troponin I elevation - Likely demand ischemia. - Troponins mildly elevated. - Hold aspirin given GI bleeding. Resume beta  blocker when blood pressure stable. - Check 2 D Echo.    Hypokalemia - Resolved with supplementation.    AKI (acute kidney injury) in the setting of stage III chronic kidney disease / uremia - Baseline creatinine 1.7-1.9. Creatinine 2.98 ---> 2.59---> 2.36 with IV fluids. - Appears to be prerenal, continue to hydrate.    Normocytic anemia/Anemia of chronic disease/chronic blood loss anemia/occult blood positive stool - Baseline hemoglobin appears to be around 8.3-8.6. Admission hemoglobin lower than usual baseline values. - Stools heme positive. - Status post a total of 3 units of PRBCs. Hemoglobin stable at 8.7 mg/dL this morning. - Continue oral iron therapy. - Holding aspirin. - GI consultation requested.    Dehydration with hypernatremia - Hydrating. Continues to be hypernatremic despite hypotonic IV fluids. Change IV fluids to D5W at 125 mL/hour.    DVT Prophylaxis - No Lovenox/heparin given heme positive stools.  Code Status: Full. Family Communication: Spouse, Vincent Miranda, updated at the bedside 03/27/15.  Not currently at the bedside.  Called her at 12:33 pm, message left at [phone]) [phone]. Disposition Plan: Home in 2 days, when plan for GI evaluation, antibiotics per ID and 2 D echo completed.   IV Access:    Peripheral IV   Procedures and diagnostic studies:   Ct Head Wo Contrast  03/26/2015   CLINICAL DATA:  Weakness and generalized fatigue.  EXAM: CT HEAD WITHOUT CONTRAST  TECHNIQUE: Contiguous axial images were obtained from the base of the skull through the vertex without intravenous contrast.  COMPARISON:  None.  FINDINGS: There is no intracranial hemorrhage, mass or evidence of acute infarction. There is moderate generalized atrophy. There is moderate chronic microvascular ischemic  change. There is no significant extra-axial fluid collection.  No acute intracranial findings are evident. The visible portions of the paranasal sinuses are clear.  IMPRESSION:  Moderate generalized atrophy and chronic microvascular changes. No acute findings.   Electronically Signed   By: Ellery Plunk M.D.   On: 03/26/2015 01:31   Dg Chest Port 1 View  03/26/2015   CLINICAL DATA:  Dyspnea, weakness  EXAM: PORTABLE CHEST - 1 VIEW  COMPARISON:  08/05/2013  FINDINGS: There is marked aortic tortuosity which appears unchanged. Heart size is normal and unchanged. The lungs are clear. The pulmonary vasculature is normal. No pneumothorax or large effusion is evident.  IMPRESSION: No acute cardiopulmonary findings.   Electronically Signed   By: Ellery Plunk M.D.   On: 03/26/2015 01:13   Dg Foot 2 Views Left  03/26/2015   CLINICAL DATA:  Gangrene  EXAM: LEFT FOOT - 2 VIEW  COMPARISON:  October 18, 2013  FINDINGS: Frontal and lateral views were obtained. The patient has had amputation at the level of the medial cuneiform -first metatarsal junction. There is extensive soft tissue air in the medial aspect of the foot distally in the area of amputation. There is dislocation at the second MTP joint site with the second proximal phalanx displaced laterally and proximal to the distal second metatarsal. There is also subluxation at the second PIP joint. No acute fracture seen. Other joint spaces appear intact. There is no overt bony destruction or erosion. There is soft tissue swelling dorsally with spurring in the dorsal midfoot. There is a small posterior calcaneal spur.  IMPRESSION: Soft tissue air medially and distally, consistent with infection. Amputation at the level of the medial cuneiform- first MTP joint without bony destruction in this area. Dislocation at the second MTP joint with remodeling of the second proximal phalanx. Subluxation at the second PIP joint. No overt bony destruction seen currently. Soft tissue swelling is noted somewhat diffusely. Areas of osteoarthritic change as described.   Electronically Signed   By: Bretta Bang III M.D.   On: 03/26/2015 07:43   Dg  Foot 2 Views Right  03/26/2015   CLINICAL DATA:  81 year old male with a history of osteomyelitis and gangrene.  EXAM: RIGHT FOOT - 2 VIEW  COMPARISON:  None.  FINDINGS: No acute fracture line identified.  Rarefaction of the distal right fifth metatarsal with thinning of the overlying soft tissues.  Decreased bone density at the head of the right first metatarsal, in a region of prior osteomyelitis.  Diffuse osteopenia.  Developing Charcot changes of the hindfoot.  Calcifications in the distribution of the anterior tibial artery and posterior tibial artery.  IMPRESSION: Changes of osteomyelitis of the right fifth metatarsal with overlying soft tissue wound.  Questionable ongoing osteomyelitis of the right first metatarsal, in a region of prior infection.  Developing Charcot joint of the hindfoot.  Extensive vascular calcifications compatible with infrapopliteal disease. If the patient has not yet been evaluated for claudication/CLI, noninvasive testing with ABI, segmental duplex, and segmental pulse volume recording may be considered as well as office based evaluation.  These results were called by telephone at the time of interpretation on 03/26/2015 at 7:52 am to Dr. Darnelle Catalan, who verbally acknowledged these results.  Signed,  Yvone Neu. Loreta Ave, DO  [facility]  [facility]   Electronically Signed   By: Gilmer Mor D.O.   On: 03/26/2015 07:52     Medical Consultants:    Dr. Aldean Baker, Orthopedic Surgery  Dr. Cliffton AstersJohn Campbell, ID  Dr. Christella HartiganJacobs, Gastroenterology  Anti-Infectives:    Vancomycin 03/26/15--->  Zosyn 03/26/15--->  Subjective:   Vincent Miranda denies pain.  No nausea or vomiting.  No dyspnea.  Admits to a history of black stools, and had rectal bleeding prior to admission.  Objective:    Filed Vitals:   03/27/15 2100 03/27/15 2343 03/28/15 0400 03/28/15 0500  BP: 111/53 118/73 117/59   Pulse:      Temp: 98.6 F (37 C) 98.4 F (36.9  C)  98.4 F (36.9 C)  TempSrc: Axillary Oral  Oral  Resp: 17 19 24    Height:      Weight:      SpO2: 99%  99%     Intake/Output Summary (Last 24 hours) at 03/28/15 0719 Last data filed at 03/28/15 0600  Gross per 24 hour  Intake 2112.5 ml  Output   1725 ml  Net  387.5 ml    Exam: Gen:  NAD, awake/alert Cardiovascular:  RRR, No M/R/G Respiratory:  Lungs CTAB Gastrointestinal:  Abdomen soft, NT/ND, + BS Extremities: See pictures taken 03/27/15 in progress note, currently dressed  Data Reviewed:    Labs: Basic Metabolic Panel:  Recent Labs Lab 03/26/15 0054 03/26/15 1249 03/27/15 0316 03/28/15 0329  NA 147* 149* 149* 152*  K 3.3* 3.1* 3.3* 4.0  CL 112* 118* 122* 125*  CO2 21* 23 19* 20*  GLUCOSE 171* 119* 93 100*  BUN 101* 96* 83* 60*  CREATININE 2.98* 2.84* 2.59* 2.36*  CALCIUM 9.8 9.4 9.0 9.2   GFR Estimated Creatinine Clearance: 20.3 mL/min (by C-G formula based on Cr of 2.36). Liver Function Tests:  Recent Labs Lab 03/26/15 0054  AST 28  ALT 30  ALKPHOS 126  BILITOT 0.4  PROT 7.5  ALBUMIN 1.7*   Coagulation profile  Recent Labs Lab 03/26/15 0152  INR 1.33    CBC:  Recent Labs Lab 03/26/15 0054 03/26/15 1249 03/26/15 2259 03/27/15 0316 03/28/15 0329  WBC 12.5*  --  13.3* 12.6* 11.8*  NEUTROABS 10.4*  --   --   --   --   HGB 7.2* 5.6* 8.3* 7.4* 8.7*  HCT 22.6* 18.2* 26.0* 22.8* 26.9*  MCV 78.2  --  82.3 80.0 82.5  PLT 278  --  180 175 169   Cardiac Enzymes:  Recent Labs Lab 03/26/15 0258 03/27/15 0815 03/27/15 1412 03/27/15 1920  TROPONINI 0.07* 0.06* 0.07* 0.07*   CBG:  Recent Labs Lab 03/26/15 0126  GLUCAP 140*   Sepsis Labs:  Recent Labs Lab 03/26/15 0054 03/26/15 0258 03/26/15 0532 03/26/15 2259 03/27/15 0316 03/28/15 0329  WBC 12.5*  --   --  13.3* 12.6* 11.8*  LATICACIDVEN  --  2.2* 3.0*  --   --   --    Microbiology Recent Results (from the past 240 hour(s))  Culture, blood (routine x 2)      Status: None (Preliminary result)   Collection Time: 03/26/15 12:57 AM  Result Value Ref Range Status   Specimen Description BLOOD LEFT ARM  Final   Special Requests BOTTLES DRAWN AEROBIC ONLY 7CC  Final   Culture   Final           BLOOD CULTURE RECEIVED NO GROWTH TO DATE CULTURE WILL BE HELD FOR 5 DAYS BEFORE ISSUING A FINAL NEGATIVE REPORT [facility]formed at [facility]    Report Status PENDING  Incomplete  MRSA PCR Screening     Status: None   Collection Time: 03/26/15  4:05 AM  Result Value Ref Range Status   MRSA by PCR NEGATIVE NEGATIVE Final    Comment:        The GeneXpert MRSA Assay (FDA approved for NASAL specimens only), is one component of a comprehensive MRSA colonization surveillance program. It is not intended to diagnose MRSA infection nor to guide or monitor treatment for MRSA infections.   Culture, blood (routine x 2)     Status: None (Preliminary result)   Collection Time: 03/26/15  5:32 AM  Result Value Ref Range Status   Specimen Description BLOOD LEFT HAND  Final   Special Requests BOTTLES DRAWN AEROBIC ONLY 5CC  Final   Culture   Final           BLOOD CULTURE RECEIVED NO GROWTH TO DATE CULTURE WILL BE HELD FOR 5 DAYS BEFORE ISSUING A FINAL NEGATIVE REPORT Performed at [facility]    Report Status PENDING  Incomplete     Medications:   . sodium chloride   Intravenous Once  . feeding supplement (ENSURE ENLIVE)  237 mL Oral BID BM  . ferrous sulfate  325 mg Oral Daily  . piperacillin-tazobactam (ZOSYN)  IV  2.25 g Intravenous Q6H  . sodium chloride  1,000 mL Intravenous Once  . vancomycin  1,000 mg Intravenous Q48H   Continuous Infusions: . 0.45 % NaCl with KCl 20 mEq / L 1,000 mL (03/27/15 1041)    Time spent: 35 minutes with > 50% of time discussing current diagnostic test results, clinical impression and plan of care, patient claims he did not know his toes were black, and that "no one told him nothing about it", despite multiple  attempts to explain things, he keeps insisting "no one told us nothing about it", and will not accept any further explanation.   LOS: 2 days   RAMA,CHRISTINA  [facility] Pager [phone]. If unable to reach me by pager, please call my cell phone at [phone].  *Please refer to [email or web address], password [email or web address] to get updated schedule on who will round on this patient, as hospitalists switch teams weekly. If 7PM-7AM, please contact night-coverage at [email or web address], password [email or web address] for any overnight needs.  03/28/2015, 7:19 AM    Information printed out and reviewed with the patient/family:     In an effort to keep you and your family informed about your hospital stay, I am providing you with this information sheet. If you or your family have any questions, please do not hesitate to have the nursing staff page me to set up a meeting time.  Also note that the hospitalist doctors typically change on Tuesdays or Wednesdays to a different hospitalist doctor.  Vincent Miranda 03/28/2015 2 (Number of days in the hospital)  Treatment team:  Dr. Hillery Aldo, Hospitalist (Internist)  Dr. Cliffton Asters, Infectious Disease Specialist to make recommendations about the best antibiotics to use  Dr. Christella Hartigan, Gastroenterologist to evaluate why you are bleeding from your rectum  Dr. Aldean Baker, Orthopedic surgeon to make recommendations about surgically removing dead tissue  Pertinent labs / studies:  ABIs: Done on 03/26/15 to evaluate your circulation to your legs.  The test shows that you have mild decreased circulation on the right and moderate decreased circulation on the left.  Decreased circulation makes it hard for your body to heal wounds and increases the risk of infection.  X-rays of your feet: Done on 03/26/15: Show bone infections on both sides.  Infections of the bone are very difficult to heal with  antibiotics alone, and often surgical removal of the infected tissue is needed to clear the  infection.   Principle Diagnosis: Osteomyelitis (infection of the bone) and gangrene (dead tissue) of both feet. Other diagnoses: Anemia (low blood count) from rectal bleeding, heart strain from severe infection.   Plan for today:  We recommend surgery to remove the dead tissue on your feet.  Since you prefer not to have the surgery recommended by Dr. Lajoyce Corners, I will have Dr. Orvan Falconer make recommendations for antibiotic treatment.  It is unlikely that antibiotics alone will cure this infection, and there is a risk the infection will get worse and become life threatening.  I have ordered a 2D Echocardiogram to evaluate the strength of your heart, since it is has been stressed from the infection.  I have asked Dr. Christella Hartigan to see and evaluate you for the blood in the stools since you have anemia (low blood counts).   Anticipated discharge date: 2-3 days.

## 2015-03-28 NOTE — Consult Note (Signed)
Referring Provider: [facility] Primary Care Physician:  Burtis JunesBLOUNT,ALVIN VINCENT, MD Primary Gastroenterologist:  unassigned  Reason for Consultation:   Anemia, heme positive stools      HPI: Vincent Miranda is a 81 y.o. male who was admitted through the ER in May 6 with generalized weakness. He has a history of osteomyelitis, cellulitis, hypertension, normocytic anemia, chronic kidney disease stage III, and gout. He has had left great toe amputation followed by subsequent infection, and gangrene. He has been having purulent drainage from the area. He has 2 toes on his right foot which are gangrenous. He was admitted sepsis likely from the gangrene as well as his feet. He has been seen by orthopedics who recommended bilateral transit tibial amputations. When seen by orthopedics per their consult note the patient and family wished to proceed with surgery, however patient's wife reportedly has changed her mind and does not want her husband to have the amputations because she feels his wounds will heal on the round. Patient has not consented to surgery. Patient has had ABIs performed that show mild reduction in arterial flow on the right and moderate reduction in arterial flow in the left..  Patient has a chronic anemia however he has required transfusion of 3 units of blood on this admission. He has been found to have heme positive stools. He states that several days prior to admission to the hospital he had several dark black tarry foul-smelling stools. This lasted for 1 day. He then had a normal stool. He has not had a bowel movement in 3 days. He reports that he has had colonoscopies in the past by "Dr. Earlene Plateravis". His last colonoscopy was 5-10 years ago. He states he never had polyps. He reports that he has also had upper endoscopies in the past. He does not know why he had these, where he had these, though did them, or what the findings were. Patient is not interested in having any endoscopic  evaluations at this time. He denies epigastric pain, nausea, or vomiting. He states that he swallowed a bite of ham Hoch several months ago and "it went down the wrong pipe". He states for 12 days he felt poorly and saw 3 specialists. He states no one told him what was wrong but finally he coughed forcefully and the piece of ham Hoch came up and he's had no problems since. He denies dysphagia. He does not know who any of the 3 specialist were, where their offices were, and says no one told him what was wrong.  Past Medical History  Diagnosis Date  . Hypertension   . Chronic kidney disease   . Anemia of chronic disease   . Gout   . Acute blood loss anemia 10/18/2013    Past Surgical History  Procedure Laterality Date  . Eye surgery    . Amputation Left 10/19/2013    Procedure: AMPUTATION RAY  LEFT GREAT TOE;  Surgeon: Nadara MustardMarcus V Duda, MD;  Location: [facility] OR;  Service: Orthopedics;  Laterality: Left;    Prior to Admission medications   Medication Sig Start Date End Date Taking? Authorizing Provider  aspirin 81 MG tablet Take 81 mg by mouth daily.   Yes Historical Provider, MD  doxycycline (VIBRA-TABS) 100 MG tablet Take 100 mg by mouth 2 (two) times daily.  03/18/15  Yes Historical Provider, MD  allopurinol (ZYLOPRIM) 100 MG tablet Take 100 mg by mouth 2 (two) times daily before a meal.     Historical Provider, MD  furosemide (LASIX) 40 MG tablet Take 40 mg by mouth 2 (two) times daily.     Historical Provider, MD  IRON PO Take 1 tablet by mouth daily.    Historical Provider, MD  labetalol (NORMODYNE) 100 MG tablet Take 2 tablets (200 mg total) by mouth 2 (two) times daily. 08/11/13   Kela MillinAdeline C Viyuoh, MD  POTASSIUM PO Take 1 tablet by mouth daily.    Historical Provider, MD    Current Facility-Administered Medications  Medication Dose Route Frequency Provider Last Rate Last Dose  . dextrose 5 % solution   Intravenous Continuous Christina P Rama, MD      . feeding supplement (ENSURE ENLIVE)  (ENSURE ENLIVE) liquid 237 mL  237 mL Oral BID BM Hillary BowJared M Gardner, DO   237 mL at 03/26/15 0942  . ferrous sulfate tablet 325 mg  325 mg Oral Daily Hillary BowJared M Gardner, DO   325 mg at 03/27/15 1049  . morphine 2 MG/ML injection 2 mg  2 mg Intravenous Q4H PRN Maryruth Bunhristina P Rama, MD   2 mg at 03/26/15 2217  . piperacillin-tazobactam (ZOSYN) IVPB 2.25 g  2.25 g Intravenous Q6H Veronda P Bryk, RPH   2.25 g at 03/28/15 0600  . sodium chloride 0.9 % bolus 1,000 mL  1,000 mL Intravenous Once Maryruth Bunhristina P Rama, MD      . vancomycin (VANCOCIN) IVPB 1000 mg/200 mL premix  1,000 mg Intravenous Q48H Veronda P Bryk, RPH   1,000 mg at 03/28/15 0459    Allergies as of 03/26/2015  . (No Known Allergies)    No family history on file.  History   Social History  . Marital Status: Married    Spouse Name: N/A  . Number of Children: N/A  . Years of Education: N/A   Occupational History  . Not on file.   Social History Main Topics  . Smoking status: Never Smoker   . Smokeless tobacco: Never Used  . Alcohol Use: No  . Drug Use: No  . Sexual Activity: Not on file   Other Topics Concern  . Not on file   Social History Narrative    Review of Systems: Per history of present illness, otherwise negative  Physical Exam: Vital signs in last 24 hours: Temp:  [97.9 F (36.6 C)-98.6 F (37 C)] 98.5 F (36.9 C) (05/08 0800) Pulse Rate:  [64-72] 64 (05/07 1523) Resp:  [14-26] 24 (05/08 0400) BP: (78-130)/(53-73) 117/59 mmHg (05/08 0400) SpO2:  [99 %-100 %] 99 % (05/08 0400) Last BM Date: 03/25/15 General:   Alert,  Well-developed, pleasant and cooperative in NAD; cachectic Head:  Normocephalic and atraumatic. Eyes:  Sclera clear, no icterus. Conjunctiva pink. right pupil misshapened from prior surgery Ears:  Normal auditory acuity. Nose:  No deformity, discharge,  or lesions. Mouth:  No deformity or lesions.   Neck:  Supple; no masses or thyromegaly. Lungs:  Clear throughout to auscultation.   No  wheezes, crackles, or rhonchi.  Heart:  Regular rate and rhythm; no murmurs, clicks, rubs,  or gallops. Abdomen:  Soft,nontender, BS active,nonpalp mass or hsm.   Rectal:  Deferred  Msk:  Symmetrical without gross deformities. . Pulses:  Normal pulses noted. Extremities: Dressings intact on feet. Foul odor from foot wounds. Obvious gangrene on EPIC photos. Neurologic: Alert and  oriented x4;  grossly normal neurologically. Psych:  Alert and cooperative. Normal mood and affect.  Lab Results:  Recent Labs  03/26/15 2259 03/27/15 0316 03/28/15 0329  WBC 13.3* 12.6* 11.8*  HGB 8.3* 7.4* 8.7*  HCT 26.0* 22.8* 26.9*  PLT 180 175 169   MCV 03/28/2015 82.5. BMET  Recent Labs  03/26/15 1249 03/27/15 0316 03/28/15 0329  NA 149* 149* 152*  K 3.1* 3.3* 4.0  CL 118* 122* 125*  CO2 23 19* 20*  GLUCOSE 119* 93 100*  BUN 96* 83* 60*  CREATININE 2.84* 2.59* 2.36*  CALCIUM 9.4 9.0 9.2   LFT  Recent Labs  03/26/15 0054  PROT 7.5  ALBUMIN 1.7*  AST 28  ALT 30  ALKPHOS 126  BILITOT 0.4   PT/INR  Recent Labs  03/26/15 0152  LABPROT 16.7*  INR 1.33     IMPRESSION/PLAN: #1. Normocytic anemia. Likely due to his chronic disease. Patient has Hemoccult positive stools but may have chronic slow GI blood loss. Patient has received blood transfusion here but has not had any bowel movements in 3 days. Patient is reluctant to have an upper endoscopy or colonoscopy. Would add PPI. Monitor hemoglobin. Low inclination to proceed with luminal examinations as patient's osteomyelitis appears to be a bigger issue. Will review with attending. Hold DVT prophylaxis given heme-positive stools. Continue iron therapy.  Hvozdovic, Moise Boring 03/28/2015,  Pager [phone]   ________________________________________________________________________  [facility] MD note:  I personally examined the patient, reviewed the data and agree with the assessment and plan described above.  He has had not overt  bleeding in 2-3 days since he's been here. Was heme + and reports a dark stool a couple days prior to admission. He has dead feet and declines surgery.  Given his overall clinical picture (advanced age, gangrenous feet) I do not recommend invasive, endoscopic testing.  Would keep him on PPI twice daily, transfuse blood products as needed.  If he has significant, overt bleeding then would reconsider endoscopy.   Rob Bunting, MD [facility] Gastroenterology Pager [phone]

## 2015-03-29 ENCOUNTER — Inpatient Hospital Stay (HOSPITAL_COMMUNITY): Payer: Medicare Other

## 2015-03-29 DIAGNOSIS — M86172 Other acute osteomyelitis, left ankle and foot: Secondary | ICD-10-CM

## 2015-03-29 DIAGNOSIS — I70268 Atherosclerosis of native arteries of extremities with gangrene, other extremity: Secondary | ICD-10-CM

## 2015-03-29 DIAGNOSIS — D62 Acute posthemorrhagic anemia: Secondary | ICD-10-CM | POA: Diagnosis present

## 2015-03-29 DIAGNOSIS — I350 Nonrheumatic aortic (valve) stenosis: Secondary | ICD-10-CM

## 2015-03-29 DIAGNOSIS — E43 Unspecified severe protein-calorie malnutrition: Secondary | ICD-10-CM

## 2015-03-29 DIAGNOSIS — R7989 Other specified abnormal findings of blood chemistry: Secondary | ICD-10-CM

## 2015-03-29 DIAGNOSIS — I96 Gangrene, not elsewhere classified: Secondary | ICD-10-CM | POA: Insufficient documentation

## 2015-03-29 DIAGNOSIS — N189 Chronic kidney disease, unspecified: Secondary | ICD-10-CM

## 2015-03-29 LAB — CBC
HCT: 25.4 % — ABNORMAL LOW (ref 39.0–52.0)
Hemoglobin: 8.4 g/dL — ABNORMAL LOW (ref 13.0–17.0)
MCH: 27.2 pg (ref 26.0–34.0)
MCHC: 33.1 g/dL (ref 30.0–36.0)
MCV: 82.2 fL (ref 78.0–100.0)
Platelets: 158 10*3/uL (ref 150–400)
RBC: 3.09 MIL/uL — ABNORMAL LOW (ref 4.22–5.81)
RDW: 18.1 % — ABNORMAL HIGH (ref 11.5–15.5)
WBC: 9.4 10*3/uL (ref 4.0–10.5)

## 2015-03-29 LAB — BASIC METABOLIC PANEL
ANION GAP: 4 — AB (ref 5–15)
BUN: 41 mg/dL — ABNORMAL HIGH (ref 6–20)
CHLORIDE: 120 mmol/L — AB (ref 101–111)
CO2: 20 mmol/L — ABNORMAL LOW (ref 22–32)
Calcium: 9.1 mg/dL (ref 8.9–10.3)
Creatinine, Ser: 2.1 mg/dL — ABNORMAL HIGH (ref 0.61–1.24)
GFR calc non Af Amer: 26 mL/min — ABNORMAL LOW (ref >60)
GFR, EST AFRICAN AMERICAN: 31 mL/min — AB (ref >60)
Glucose, Bld: 120 mg/dL — ABNORMAL HIGH (ref 70–99)
Potassium: 4 mmol/L (ref 3.5–5.1)
Sodium: 144 mmol/L (ref 135–145)

## 2015-03-29 MED ORDER — PERFLUTREN LIPID MICROSPHERE
1.0000 mL | INTRAVENOUS | Status: AC | PRN
  Administered 2015-03-29: 2 mL via INTRAVENOUS
  Filled 2015-03-29: qty 10

## 2015-03-29 MED ORDER — CETYLPYRIDINIUM CHLORIDE 0.05 % MT LIQD
7.0000 mL | Freq: Two times a day (BID) | OROMUCOSAL | Status: DC
  Administered 2015-03-29 – 2015-04-01 (×6): 7 mL via OROMUCOSAL

## 2015-03-29 MED ORDER — PIPERACILLIN-TAZOBACTAM 3.375 G IVPB
3.3750 g | Freq: Three times a day (TID) | INTRAVENOUS | Status: DC
  Administered 2015-03-29 – 2015-04-01 (×9): 3.375 g via INTRAVENOUS
  Filled 2015-03-29 (×11): qty 50

## 2015-03-29 NOTE — Clinical Documentation Improvement (Signed)
Supporting Information: Patient with heme positive stools, chronic blood loss anemia per 5/08 progress notes.  Labs:  H/H: 5/06:  5.6/18.2.  Treatment:   Has received a total of 3 units of PRBC per 5/08 progress notes. Ferrous sulfate 325 mg qd per MAR.   Possible Clinical Condition: . Documentation of Anemia should include the type of anemia: --Nutritional --Hemolytic --Acute blood loss anemia --Acute on chronic blood loss anemia --Other (please specify) . Include in documentation if Anemia is due to nutrition or mineral deficits, resulting in a nutritional anemia . Document if the Anemia is due to a neoplasm (primary and/or secondary) . Document whether the ANEMIA is "related to or due to" chemo or radiotherapy treatments . Document any "cause-and-effect" relationship between the intervention and the blood or immune disorder . Document the specific drug if anemia is drug-induced . Link any laboratory findings to a related diagnosis (if appropriate) . Document any associated diagnoses/conditions    Thank Gabriel CirriYou, Anndrea Mihelich Mathews-Bethea,RN,BSN,Clinical Documentation Specialist [phone] [email or web address]

## 2015-03-29 NOTE — Progress Notes (Signed)
ANTIBIOTIC CONSULT NOTE  Pharmacy Consult for Vancocin and Zosyn Indication: osteomyelitis and gangene  No Known Allergies  Patient Measurements: Height: 5\' 11"  (180.3 cm) Weight: 146 lb 6.2 oz (66.4 kg) IBW/kg (Calculated) : 75.3  Vital Signs: Temp: 97.6 F (36.4 C) (05/09 1246) Temp Source: Axillary (05/09 1246) BP: 119/59 mmHg (05/09 1246) Pulse Rate: 63 (05/09 1246)  Labs:  Recent Labs  03/27/15 0316 03/28/15 0329 03/29/15 0320  WBC 12.6* 11.8* 9.4  HGB 7.4* 8.7* 8.4*  PLT 175 169 158  CREATININE 2.59* 2.36* 2.10*   Estimated Creatinine Clearance: 22.8 mL/min (by C-G formula based on Cr of 2.1).  Medical History: Past Medical History  Diagnosis Date  . Hypertension   . Chronic kidney disease   . Anemia of chronic disease   . Gout   . Acute blood loss anemia 10/18/2013    Assessment: 81yo male c/o generalized fatigue, has gangrenous toes on bilateral feet w/ bones visible, on doxycycline PTA, concern for osteo, to begin IV ABX.  Goal of Therapy:  Vancomycin trough level 15-20 mcg/ml  Plan:  -vancomycin 1 g IV q48h -increase zosyn to 3.375 g IV q8h -vancomycin trough tomorrow morning -watch renal fx, cultures, duration of therapy, plans for amputation    Agapito GamesAlison Osric Klopf, PharmD, BCPS Clinical Pharmacist Pager: [phone] 03/29/2015 1:58 PM

## 2015-03-29 NOTE — Progress Notes (Signed)
Patient ID: Vincent PigeonRobert T Miranda, male   DOB: 03-29-1925, 81 y.o.   MRN: [id number] I had a long discussion with the patient's wife on the phone. Patient's wife was at bedside. Discussed that with patient's infection in both lower extremities the patient's only safe medical option would be a bilateral transtibial amputation. Patient's wife states that both the patient and herself do not want to give up and do not want to consider amputation. I discussed the patient is at serious risk of loss of life due to the infection in both feet. Patient's wife states they understand and wish to proceed with nonoperative treatment at this time. Discussed that this course is AGAINST MEDICAL ADVICE. She again reiterates that she understands and does not want to pursue surgery. I will follow-up as needed.

## 2015-03-29 NOTE — Progress Notes (Addendum)
Progress Note   Vincent Miranda ZOX:[id number] DOB: 10/05/1925 DOA: 03/26/2015 PCP: Burtis Junes, MD   Brief Narrative:   Vincent Miranda is an 81 y.o. male with a PMH of bilateral foot gangrene, prior history of amputation of the great toe on the left which has become infected again and now has exposed bone with purulent drainage as well as 2 toes on his right foot that are frankly gangrenous, treated with doxycycline as an outpatient, was admitted 03/26/15 with chief complaint of generalized fatigue. Orthopedic surgery has been consulted with plans for transtibial amputations 03/28/15. Patient also has GI bleeding and has required 2 units of blood during hospital stay. Patient's wife has indicated that she does not want her husband to have transtibial amputations and despite multiple attempts to educate her, continues to insist that he can heal his wounds since he has done so in the past.  Assessment/Plan:   Principal Problem:   Severe sepsis with acute organ dysfunction secondary to bilateral foot gangrene/osteomyelitis of left foot - Continue empiric antibiotics with vancomycin and Zosyn. - Follow-up final blood cultures, which are negative to date. - Seen by Dr. Lajoyce Corners 03/26/15, who recommended transtibial amputations however patient would not consent. - ABIs performed showing mild reduction in arterial flow on the right and moderate reduction in arterial flow on the left. - Initially required fluid volume resuscitation for hypotension. Remains hemodynamically stable. - ID following for recommendations regarding antibiotic therapy/duration. - Given refusal to have surgery, will D/C home on antibiotics once ID makes recommendations.  Active Problems:   Hypertension - Labetalol remains on hold. Blood pressure stable.    Severe protein calorie malnutrition - Nutritional supplements ordered.    Troponin I elevation - Likely demand ischemia. - Troponins mildly elevated. -  Hold aspirin given GI bleeding. Resume beta blocker when blood pressure stable. - Follow-up 2 D Echo, which has not yet been done.    Hypokalemia - Resolved with supplementation.    AKI (acute kidney injury) in the setting of stage III chronic kidney disease / uremia - Baseline creatinine 1.7-1.9. Creatinine 2.98 ---> 2.59---> 2.36 ---> 2.10 with IV fluids. - Appears to be prerenal, continue to hydrate. Creatinine almost back to baseline values.    Acute on chronic blood loss anemia/occult blood positive stool with history of anemia of chronic disease - Baseline hemoglobin appears to be around 8.3-8.6. Admission hemoglobin lower than usual baseline values. - Stools heme positive. - Status post a total of 3 units of PRBCs. Hemoglobin stable at 8.4 mg/dL this morning. - Continue oral iron therapy. - Holding aspirin. - Evaluated by GI with no current recommendations for further evaluation.    Dehydration with hypernatremia - Resolved with hypotonic IV fluids.    DVT Prophylaxis - No Lovenox/heparin given heme positive stools.  Code Status: Full. Family Communication: Spouse, Vincent Miranda, updated at the bedside. Disposition Plan: Possibly home 03/30/15, if 2-D echo does not show any significant abnormalities and once final ID recommendations given.   IV Access:    Peripheral IV   Procedures and diagnostic studies:   Ct Head Wo Contrast  03/26/2015   CLINICAL DATA:  Weakness and generalized fatigue.  EXAM: CT HEAD WITHOUT CONTRAST  TECHNIQUE: Contiguous axial images were obtained from the base of the skull through the vertex without intravenous contrast.  COMPARISON:  None.  FINDINGS: There is no intracranial hemorrhage, mass or evidence of acute infarction. There is moderate generalized atrophy. There is moderate chronic microvascular  ischemic change. There is no significant extra-axial fluid collection.  No acute intracranial findings are evident. The visible portions of the  paranasal sinuses are clear.  IMPRESSION: Moderate generalized atrophy and chronic microvascular changes. No acute findings.   Electronically Signed   By: Ellery Plunk M.D.   On: 03/26/2015 01:31   Dg Chest Port 1 View  03/26/2015   CLINICAL DATA:  Dyspnea, weakness  EXAM: PORTABLE CHEST - 1 VIEW  COMPARISON:  08/05/2013  FINDINGS: There is marked aortic tortuosity which appears unchanged. Heart size is normal and unchanged. The lungs are clear. The pulmonary vasculature is normal. No pneumothorax or large effusion is evident.  IMPRESSION: No acute cardiopulmonary findings.   Electronically Signed   By: Ellery Plunk M.D.   On: 03/26/2015 01:13   Dg Foot 2 Views Left  03/26/2015   CLINICAL DATA:  Gangrene  EXAM: LEFT FOOT - 2 VIEW  COMPARISON:  October 18, 2013  FINDINGS: Frontal and lateral views were obtained. The patient has had amputation at the level of the medial cuneiform -first metatarsal junction. There is extensive soft tissue air in the medial aspect of the foot distally in the area of amputation. There is dislocation at the second MTP joint site with the second proximal phalanx displaced laterally and proximal to the distal second metatarsal. There is also subluxation at the second PIP joint. No acute fracture seen. Other joint spaces appear intact. There is no overt bony destruction or erosion. There is soft tissue swelling dorsally with spurring in the dorsal midfoot. There is a small posterior calcaneal spur.  IMPRESSION: Soft tissue air medially and distally, consistent with infection. Amputation at the level of the medial cuneiform- first MTP joint without bony destruction in this area. Dislocation at the second MTP joint with remodeling of the second proximal phalanx. Subluxation at the second PIP joint. No overt bony destruction seen currently. Soft tissue swelling is noted somewhat diffusely. Areas of osteoarthritic change as described.   Electronically Signed   By: Bretta Bang III M.D.   On: 03/26/2015 07:43   Dg Foot 2 Views Right  03/26/2015   CLINICAL DATA:  81 year old male with a history of osteomyelitis and gangrene.  EXAM: RIGHT FOOT - 2 VIEW  COMPARISON:  None.  FINDINGS: No acute fracture line identified.  Rarefaction of the distal right fifth metatarsal with thinning of the overlying soft tissues.  Decreased bone density at the head of the right first metatarsal, in a region of prior osteomyelitis.  Diffuse osteopenia.  Developing Charcot changes of the hindfoot.  Calcifications in the distribution of the anterior tibial artery and posterior tibial artery.  IMPRESSION: Changes of osteomyelitis of the right fifth metatarsal with overlying soft tissue wound.  Questionable ongoing osteomyelitis of the right first metatarsal, in a region of prior infection.  Developing Charcot joint of the hindfoot.  Extensive vascular calcifications compatible with infrapopliteal disease. If the patient has not yet been evaluated for claudication/CLI, noninvasive testing with ABI, segmental duplex, and segmental pulse volume recording may be considered as well as office based evaluation.  These results were called by telephone at the time of interpretation on 03/26/2015 at 7:52 am to Dr. Darnelle Catalan, who verbally acknowledged these results.  Signed,  Yvone Neu. Loreta Ave, DO  [facility]  [facility]   Electronically Signed   By: Gilmer Mor D.O.   On: 03/26/2015 07:52     Medical Consultants:    Dr. Aldean Baker, Orthopedic  Surgery  Dr. Cliffton AstersJohn Campbell, ID  Dr. Christella HartiganJacobs, Gastroenterology  Anti-Infectives:    Vancomycin 03/26/15--->  Zosyn 03/26/15--->  Subjective:   Vincent Miranda is getting his 2 D echo at this time.  He continues to deny nausea, vomiting, abdominal/chest pain, dyspnea.  Objective:    Filed Vitals:   03/29/15 0000 03/29/15 0200 03/29/15 0400 03/29/15 0600  BP: 109/64 115/68 121/64 118/59  Pulse: 56 62 59 59    Temp: 98 F (36.7 C)  98 F (36.7 C)   TempSrc: Oral  Oral   Resp: 14 15 14 29   Height:      Weight:      SpO2:        Intake/Output Summary (Last 24 hours) at 03/29/15 0713 Last data filed at 03/29/15 0600  Gross per 24 hour  Intake 3070.83 ml  Output   1400 ml  Net 1670.83 ml    Exam: Gen:  NAD, awake/alert Cardiovascular:  RRR, No M/R/G Respiratory:  Lungs CTAB Gastrointestinal:  Abdomen soft, NT/ND, + BS Extremities: See pictures taken 03/27/15 in progress note, currently dressed  Data Reviewed:    Labs: Basic Metabolic Panel:  Recent Labs Lab 03/26/15 0054 03/26/15 1249 03/27/15 0316 03/28/15 0329 03/29/15 0320  NA 147* 149* 149* 152* 144  K 3.3* 3.1* 3.3* 4.0 4.0  CL 112* 118* 122* 125* 120*  CO2 21* 23 19* 20* 20*  GLUCOSE 171* 119* 93 100* 120*  BUN 101* 96* 83* 60* 41*  CREATININE 2.98* 2.84* 2.59* 2.36* 2.10*  CALCIUM 9.8 9.4 9.0 9.2 9.1   GFR Estimated Creatinine Clearance: 22.8 mL/min (by C-G formula based on Cr of 2.1). Liver Function Tests:  Recent Labs Lab 03/26/15 0054  AST 28  ALT 30  ALKPHOS 126  BILITOT 0.4  PROT 7.5  ALBUMIN 1.7*   Coagulation profile  Recent Labs Lab 03/26/15 0152  INR 1.33    CBC:  Recent Labs Lab 03/26/15 0054 03/26/15 1249 03/26/15 2259 03/27/15 0316 03/28/15 0329 03/29/15 0320  WBC 12.5*  --  13.3* 12.6* 11.8* 9.4  NEUTROABS 10.4*  --   --   --   --   --   HGB 7.2* 5.6* 8.3* 7.4* 8.7* 8.4*  HCT 22.6* 18.2* 26.0* 22.8* 26.9* 25.4*  MCV 78.2  --  82.3 80.0 82.5 82.2  PLT 278  --  180 175 169 158   Cardiac Enzymes:  Recent Labs Lab 03/26/15 0258 03/27/15 0815 03/27/15 1412 03/27/15 1920  TROPONINI 0.07* 0.06* 0.07* 0.07*   CBG:  Recent Labs Lab 03/26/15 0126  GLUCAP 140*   Sepsis Labs:  Recent Labs Lab 03/26/15 0258 03/26/15 0532 03/26/15 2259 03/27/15 0316 03/28/15 0329 03/29/15 0320  WBC  --   --  13.3* 12.6* 11.8* 9.4  LATICACIDVEN 2.2* 3.0*  --   --   --   --     Microbiology Recent Results (from the past 240 hour(s))  Culture, blood (routine x 2)     Status: None (Preliminary result)   Collection Time: 03/26/15 12:57 AM  Result Value Ref Range Status   Specimen Description BLOOD LEFT ARM  Final   Special Requests BOTTLES DRAWN AEROBIC ONLY 7CC  Final   Culture   Final           BLOOD CULTURE RECEIVED NO GROWTH TO DATE CULTURE WILL BE HELD FOR 5 DAYS BEFORE ISSUING A FINAL NEGATIVE REPORT Performed at [facility]    Report Status PENDING  Incomplete  MRSA  PCR Screening     Status: None   Collection Time: 03/26/15  4:05 AM  Result Value Ref Range Status   MRSA by PCR NEGATIVE NEGATIVE Final    Comment:        The GeneXpert MRSA Assay (FDA approved for NASAL specimens only), is one component of a comprehensive MRSA colonization surveillance program. It is not intended to diagnose MRSA infection nor to guide or monitor treatment for MRSA infections.   Culture, blood (routine x 2)     Status: None (Preliminary result)   Collection Time: 03/26/15  5:32 AM  Result Value Ref Range Status   Specimen Description BLOOD LEFT HAND  Final   Special Requests BOTTLES DRAWN AEROBIC ONLY 5CC  Final   Culture   Final           BLOOD CULTURE RECEIVED NO GROWTH TO DATE CULTURE WILL BE HELD FOR 5 DAYS BEFORE ISSUING A FINAL NEGATIVE REPORT Performed at [facility]    Report Status PENDING  Incomplete     Medications:   . antiseptic oral rinse  7 mL Mouth Rinse BID  . feeding supplement (ENSURE ENLIVE)  237 mL Oral BID BM  . ferrous sulfate  325 mg Oral Daily  . pantoprazole  40 mg Oral BID  . piperacillin-tazobactam (ZOSYN)  IV  2.25 g Intravenous Q6H  . sodium chloride  1,000 mL Intravenous Once  . vancomycin  1,000 mg Intravenous Q48H   Continuous Infusions: . dextrose 125 mL/hr at 03/29/15 0345    Time spent: 25 minutes.   LOS: 3 days   Vincent Miranda,Vincent Miranda  [facility] Pager [phone]. If unable to reach me by  pager, please call my cell phone at [phone].  *Please refer to [email or web address], password [email or web address] to get updated schedule on who will round on this patient, as hospitalists switch teams weekly. If 7PM-7AM, please contact night-coverage at [email or web address], password [email or web address] for any overnight needs.  03/29/2015, 7:13 AM

## 2015-03-29 NOTE — Progress Notes (Signed)
  Echocardiogram 2D Echocardiogram with Definity has been performed.  Vincent Miranda 03/29/2015, 10:42 AM

## 2015-03-29 NOTE — Progress Notes (Signed)
INFECTIOUS DISEASE PROGRESS NOTE  ID: Vincent PigeonRobert T Miranda is a 81 y.o. male with  Principal Problem:   Atherosclerotic peripheral vascular disease with gangrene Active Problems:   AKI (acute kidney injury)   Protein-calorie malnutrition, severe   Hypertension   CKD (chronic kidney disease), stage III   Anemia of chronic disease   Dehydration with hypernatremia   Uremia   Acute on Chronic blood loss anemia   Occult blood positive stool   Osteomyelitis of left foot   Gangrene of foot   Severe sepsis with acute organ dysfunction   Elevated troponin   Acute blood loss anemia  Subjective: Resting quietly.  Awakens easily, no complaints  Abtx:  Anti-infectives    Start     Dose/Rate Route Frequency Ordered Stop   03/29/15 1400  piperacillin-tazobactam (ZOSYN) IVPB 3.375 g     3.375 g 12.5 mL/hr over 240 Minutes Intravenous 3 times per day 03/29/15 0818     03/26/15 0400  vancomycin (VANCOCIN) IVPB 1000 mg/200 mL premix     1,000 mg 200 mL/hr over 60 Minutes Intravenous Every 48 hours 03/26/15 0334     03/26/15 0400  piperacillin-tazobactam (ZOSYN) IVPB 2.25 g  Status:  Discontinued     2.25 g 100 mL/hr over 30 Minutes Intravenous Every 6 hours 03/26/15 0334 03/29/15 0818      Medications:  Scheduled: . antiseptic oral rinse  7 mL Mouth Rinse BID  . feeding supplement (ENSURE ENLIVE)  237 mL Oral BID BM  . ferrous sulfate  325 mg Oral Daily  . pantoprazole  40 mg Oral BID  . piperacillin-tazobactam (ZOSYN)  IV  3.375 g Intravenous 3 times per day  . sodium chloride  1,000 mL Intravenous Once  . vancomycin  1,000 mg Intravenous Q48H    Objective: Vital signs in last 24 hours: Temp:  [97.9 F (36.6 C)-98.6 F (37 C)] 98.2 F (36.8 C) (05/09 0810) Pulse Rate:  [56-72] 72 (05/09 0810) Resp:  [13-29] 14 (05/09 0810) BP: (109-151)/(55-91) 148/91 mmHg (05/09 0810) SpO2:  [98 %-100 %] 98 % (05/09 0737)   General appearance: alert and no distress Resp: clear to  auscultation bilaterally Cardio: regular rate and rhythm GI: normal findings: bowel sounds normal and soft, non-tender Extremities: gangrenous lesion on L foot, R foot wrapped. d/c sampled from L foot. sent for Cx. foul odor.   Lab Results  Recent Labs  03/28/15 0329 03/29/15 0320  WBC 11.8* 9.4  HGB 8.7* 8.4*  HCT 26.9* 25.4*  NA 152* 144  K 4.0 4.0  CL 125* 120*  CO2 20* 20*  BUN 60* 41*  CREATININE 2.36* 2.10*   Liver Panel No results for input(s): PROT, ALBUMIN, AST, ALT, ALKPHOS, BILITOT, BILIDIR, IBILI in the last 72 hours. Sedimentation Rate No results for input(s): ESRSEDRATE in the last 72 hours. C-Reactive Protein No results for input(s): CRP in the last 72 hours.  Microbiology: Recent Results (from the past 240 hour(s))  Culture, blood (routine x 2)     Status: None (Preliminary result)   Collection Time: 03/26/15 12:57 AM  Result Value Ref Range Status   Specimen Description BLOOD LEFT ARM  Final   Special Requests BOTTLES DRAWN AEROBIC ONLY 7CC  Final   Culture   Final           BLOOD CULTURE RECEIVED NO GROWTH TO DATE CULTURE WILL BE HELD FOR 5 DAYS BEFORE ISSUING A FINAL NEGATIVE REPORT Performed at [facility]    Report  Status PENDING  Incomplete  MRSA PCR Screening     Status: None   Collection Time: 03/26/15  4:05 AM  Result Value Ref Range Status   MRSA by PCR NEGATIVE NEGATIVE Final    Comment:        The GeneXpert MRSA Assay (FDA approved for NASAL specimens only), is one component of a comprehensive MRSA colonization surveillance program. It is not intended to diagnose MRSA infection nor to guide or monitor treatment for MRSA infections.   Culture, blood (routine x 2)     Status: None (Preliminary result)   Collection Time: 03/26/15  5:32 AM  Result Value Ref Range Status   Specimen Description BLOOD LEFT HAND  Final   Special Requests BOTTLES DRAWN AEROBIC ONLY 5CC  Final   Culture   Final           BLOOD CULTURE RECEIVED NO  GROWTH TO DATE CULTURE WILL BE HELD FOR 5 DAYS BEFORE ISSUING A FINAL NEGATIVE REPORT Performed at [facility]    Report Status PENDING  Incomplete    Studies/Results: No results found.   Assessment/Plan: Sepsis Osteomyelitis, gangrene of BLE (pt refusing surgery) PVD CRI Severe protein calorie malnutrition + Troponin  Total days of antibiotics: 4 vanco/zosyn  No change in anbx Cx sent continued conversation regarding amputation.           Vincent SaxJeffrey Brenin Miranda Infectious Diseases (pager) [phone] [email or web address] 03/29/2015, 12:01 PM  LOS: 3 days

## 2015-03-30 DIAGNOSIS — I70263 Atherosclerosis of native arteries of extremities with gangrene, bilateral legs: Secondary | ICD-10-CM

## 2015-03-30 DIAGNOSIS — D62 Acute posthemorrhagic anemia: Secondary | ICD-10-CM

## 2015-03-30 LAB — HEMOGLOBIN A1C
HEMOGLOBIN A1C: 5.6 % (ref 4.8–5.6)
Mean Plasma Glucose: 114 mg/dL

## 2015-03-30 LAB — VANCOMYCIN, RANDOM: Vancomycin Rm: 10 ug/mL

## 2015-03-30 MED ORDER — ALLOPURINOL 100 MG PO TABS
100.0000 mg | ORAL_TABLET | Freq: Two times a day (BID) | ORAL | Status: DC
  Administered 2015-03-30 – 2015-04-02 (×7): 100 mg via ORAL
  Filled 2015-03-30 (×10): qty 1

## 2015-03-30 MED ORDER — VANCOMYCIN HCL IN DEXTROSE 750-5 MG/150ML-% IV SOLN
750.0000 mg | INTRAVENOUS | Status: DC
  Administered 2015-03-31 – 2015-04-02 (×3): 750 mg via INTRAVENOUS
  Filled 2015-03-30 (×4): qty 150

## 2015-03-30 MED ORDER — LABETALOL HCL 200 MG PO TABS
200.0000 mg | ORAL_TABLET | Freq: Two times a day (BID) | ORAL | Status: DC
  Administered 2015-03-30 – 2015-04-01 (×5): 200 mg via ORAL
  Filled 2015-03-30 (×6): qty 1

## 2015-03-30 NOTE — Progress Notes (Signed)
ANTIBIOTIC CONSULT NOTE  Pharmacy Consult for Vancocin and Zosyn Indication: osteomyelitis and gangene  No Known Allergies  Patient Measurements: Height: 5\' 11"  (180.3 cm) Weight: 146 lb 6.2 oz (66.4 kg) IBW/kg (Calculated) : 75.3  Vital Signs: Temp: 98 F (36.7 C) (05/10 0756) Temp Source: Oral (05/10 0756) BP: 158/69 mmHg (05/10 0756) Pulse Rate: 76 (05/10 0756)  Labs:  Recent Labs  03/28/15 0329 03/29/15 0320  WBC 11.8* 9.4  HGB 8.7* 8.4*  PLT 169 158  CREATININE 2.36* 2.10*   Estimated Creatinine Clearance: 22.8 mL/min (by C-G formula based on Cr of 2.1).  Medical History: Past Medical History  Diagnosis Date  . Hypertension   . Chronic kidney disease   . Anemia of chronic disease   . Gout   . Acute blood loss anemia 10/18/2013    Assessment: 81yo male c/o generalized fatigue, has gangrenous toes on bilateral feet w/ bones visible, on doxycycline PTA, concern for osteo, on day #5 vancomycin/zosyn.  Vancomycin random level returned SUBtherapeutic this morning - will increase dose. SCr improving 2.3 > 2.1, eCrCl 20-25 ml/min.  Goal of Therapy:  Vancomycin trough level 15-20 mcg/ml  Plan:  -Increase vancomycin to 750 mg IV q24h -Continue zosyn at 3.375 g IV q8h -Watch renal fx, cultures, duration of therapy, plans for amputation -Repeat VT as needed    Agapito GamesAlison Deseri Loss, PharmD, BCPS Clinical Pharmacist Pager: [phone] 03/30/2015 8:22 AM

## 2015-03-30 NOTE — Progress Notes (Signed)
INFECTIOUS DISEASE PROGRESS NOTE  ID: Vincent PigeonRobert T Hudlow is a 81 y.o. male with  Principal Problem:   Atherosclerotic peripheral vascular disease with gangrene Active Problems:   AKI (acute kidney injury)   Protein-calorie malnutrition, severe   Hypertension   CKD (chronic kidney disease), stage III   Anemia of chronic disease   Dehydration with hypernatremia   Uremia   Acute on Chronic blood loss anemia   Occult blood positive stool   Osteomyelitis of left foot   Gangrene of foot   Severe sepsis with acute organ dysfunction   Elevated troponin   Acute blood loss anemia   Gangrene  Subjective: Without complaints  Abtx:  Anti-infectives    Start     Dose/Rate Route Frequency Ordered Stop   03/31/15 0530  vancomycin (VANCOCIN) IVPB 750 mg/150 ml premix     750 mg 150 mL/hr over 60 Minutes Intravenous Every 24 hours 03/30/15 0822     03/29/15 1400  piperacillin-tazobactam (ZOSYN) IVPB 3.375 g     3.375 g 12.5 mL/hr over 240 Minutes Intravenous 3 times per day 03/29/15 0818     03/26/15 0400  vancomycin (VANCOCIN) IVPB 1000 mg/200 mL premix  Status:  Discontinued     1,000 mg 200 mL/hr over 60 Minutes Intravenous Every 48 hours 03/26/15 0334 03/30/15 0822   03/26/15 0400  piperacillin-tazobactam (ZOSYN) IVPB 2.25 g  Status:  Discontinued     2.25 g 100 mL/hr over 30 Minutes Intravenous Every 6 hours 03/26/15 0334 03/29/15 0818      Medications:  Scheduled: . allopurinol  100 mg Oral BID AC  . antiseptic oral rinse  7 mL Mouth Rinse BID  . feeding supplement (ENSURE ENLIVE)  237 mL Oral BID BM  . ferrous sulfate  325 mg Oral Daily  . labetalol  200 mg Oral BID  . pantoprazole  40 mg Oral BID  . piperacillin-tazobactam (ZOSYN)  IV  3.375 g Intravenous 3 times per day  . sodium chloride  1,000 mL Intravenous Once  . [START ON 03/31/2015] vancomycin  750 mg Intravenous Q24H    Objective: Vital signs in last 24 hours: Temp:  [97.7 F (36.5 C)-98.1 F (36.7 C)]  97.8 F (36.6 C) (05/10 1151) Pulse Rate:  [57-100] 57 (05/10 1151) Resp:  [11-25] 25 (05/10 1151) BP: (104-158)/(49-85) 105/49 mmHg (05/10 1151) SpO2:  [93 %-100 %] 99 % (05/10 1151)   General appearance: alert, cooperative and no distress Resp: clear to auscultation bilaterally Cardio: regular rate and rhythm GI: normal findings: bowel sounds normal and soft, non-tender Extremities: wound dressed. malodorous.   Lab Results  Recent Labs  03/28/15 0329 03/29/15 0320  WBC 11.8* 9.4  HGB 8.7* 8.4*  HCT 26.9* 25.4*  NA 152* 144  K 4.0 4.0  CL 125* 120*  CO2 20* 20*  BUN 60* 41*  CREATININE 2.36* 2.10*   Liver Panel No results for input(s): PROT, ALBUMIN, AST, ALT, ALKPHOS, BILITOT, BILIDIR, IBILI in the last 72 hours. Sedimentation Rate No results for input(s): ESRSEDRATE in the last 72 hours. C-Reactive Protein No results for input(s): CRP in the last 72 hours.  Microbiology: Recent Results (from the past 240 hour(s))  Culture, blood (routine x 2)     Status: None (Preliminary result)   Collection Time: 03/26/15 12:57 AM  Result Value Ref Range Status   Specimen Description BLOOD LEFT ARM  Final   Special Requests BOTTLES DRAWN AEROBIC ONLY [facility]7CC  Final   Culture   Final  BLOOD CULTURE RECEIVED NO GROWTH TO DATE CULTURE WILL BE HELD FOR 5 DAYS BEFORE ISSUING A FINAL NEGATIVE REPORT Performed at [facility]    Report Status PENDING  Incomplete  MRSA PCR Screening     Status: None   Collection Time: 03/26/15  4:05 AM  Result Value Ref Range Status   MRSA by PCR NEGATIVE NEGATIVE Final    Comment:        The GeneXpert MRSA Assay (FDA approved for NASAL specimens only), is one component of a comprehensive MRSA colonization surveillance program. It is not intended to diagnose MRSA infection nor to guide or monitor treatment for MRSA infections.   Culture, blood (routine x 2)     Status: None (Preliminary result)   Collection Time: 03/26/15   5:32 AM  Result Value Ref Range Status   Specimen Description BLOOD LEFT HAND  Final   Special Requests BOTTLES DRAWN AEROBIC ONLY 5CC  Final   Culture   Final           BLOOD CULTURE RECEIVED NO GROWTH TO DATE CULTURE WILL BE HELD FOR 5 DAYS BEFORE ISSUING A FINAL NEGATIVE REPORT Performed at [facility]    Report Status PENDING  Incomplete  Wound culture     Status: None (Preliminary result)   Collection Time: 03/29/15 12:41 PM  Result Value Ref Range Status   Specimen Description WOUND LEFT FOOT  Final   Special Requests Normal  Final   Gram Stain   Final    FEW WBC PRESENT,BOTH PMN AND MONONUCLEAR NO SQUAMOUS EPITHELIAL CELLS SEEN RARE GRAM POSITIVE COCCI IN PAIRS Performed at [facility]    Culture   Final    Culture reincubated for better growth Performed at [facility]    Report Status PENDING  Incomplete    Studies/Results: No results found.   Assessment/Plan:  Sepsis Osteomyelitis, gangrene of BLE (pt refusing surgery) PVD CRI Severe protein calorie malnutrition + Troponin  Total days of antibiotics: 5 vanco/zosyn  His g/s shows GPC, hopefully can give him long term IV (6 weeks) then convert him to po regiemen. However, given depth/degree of his wounds, I am not sure we will do better than suppress him (at best) Await Cx.  Cr is better today Pt is unclear if he would be able to do home IV therapy.  Needs CSW/CM help         Johny SaxJeffrey Haniyah Maciolek Infectious Diseases (pager) [phone] [email or web address] 03/30/2015, 2:47 PM  LOS: 4 days

## 2015-03-30 NOTE — Progress Notes (Signed)
Progress Note   Vincent Miranda:[id number] DOB: 02-Jan-1925 DOA: 03/26/2015 PCP: Burtis Junes, MD   Brief Narrative:   Vincent Miranda is an 81 y.o. male with a PMH of bilateral foot gangrene, prior history of amputation of the great toe on the left which has become infected again and now has exposed bone with purulent drainage as well as 2 toes on his right foot that are frankly gangrenous, treated with doxycycline as an outpatient, was admitted 03/26/15 with chief complaint of generalized fatigue. Orthopedic surgery has been consulted with plans for transtibial amputations 03/28/15. Patient also has GI bleeding and has required 2 units of blood during hospital stay. Patient's wife has indicated that she does not want her husband to have transtibial amputations and despite multiple attempts to educate her, continues to insist that he can heal his wounds since he has done so in the past.  ID consulted for assistance with medical management in the face of surgical refusal.  Assessment/Plan:   Principal Problem:   Severe sepsis with acute organ dysfunction secondary to bilateral foot gangrene/osteomyelitis of left foot - Continue empiric antibiotics with vancomycin and Zosyn. - Follow-up final blood cultures, which are negative to date. - Seen by Dr. Lajoyce Corners 03/26/15, who recommended transtibial amputations however patient would not consent. - ABIs performed showing mild reduction in arterial flow on the right and moderate reduction in arterial flow on the left. - Initially required fluid volume resuscitation for hypotension. Remains hemodynamically stable. - ID following for recommendations regarding antibiotic therapy/duration. - Follow-up wound cultures done at the bedside. - Given refusal to have surgery, will D/C home on antibiotics once ID makes recommendations. - Re-consult [facility] for home wound care recommendations.  Active Problems:   Hypertension - Resume Labetalol. Lasix  remains on hold.    Severe protein calorie malnutrition - Nutritional supplements ordered.    Troponin I elevation - Likely demand ischemia. - Troponins mildly elevated. - Hold aspirin given GI bleeding. Resume beta blocker.. - Follow-up 2 D Echo, results still pending.    Hypokalemia - Resolved with supplementation.    AKI (acute kidney injury) in the setting of stage III chronic kidney disease / uremia - Baseline creatinine 1.7-1.9. Creatinine 2.98 ---> 2.59---> 2.36 ---> 2.10 with IV fluids. - Appears to be prerenal, continue to hydrate. Creatinine almost back to baseline values.    Acute on chronic blood loss anemia/occult blood positive stool with history of anemia of chronic disease - Baseline hemoglobin appears to be around 8.3-8.6. Admission hemoglobin lower than usual baseline values. - Stools heme positive. - Status post a total of 3 units of PRBCs. Hemoglobin stable post transfusion. - Continue oral iron therapy. - Holding aspirin. - Evaluated by GI with no current recommendations for further evaluation.    Dehydration with hypernatremia - Resolved with hypotonic IV fluids.    DVT Prophylaxis - No Lovenox/heparin given heme positive stools.  Code Status: Full. Family Communication: Spouse, Vincent Miranda, updated at the bedside 03/29/15.  Updated by telephone today. Disposition Plan: Home in 1-2 days, when wound cultures final and ID gives recommendations about IV antibiotic therapy.  May need PICC if IV antibiotics warranted.  If he is to remain in hospital longer than this, consider LTAC.   IV Access:    Peripheral IV   Procedures and diagnostic studies:   Ct Head Wo Contrast  03/26/2015   CLINICAL DATA:  Weakness and generalized fatigue.  EXAM: CT HEAD WITHOUT CONTRAST  TECHNIQUE:  Contiguous axial images were obtained from the base of the skull through the vertex without intravenous contrast.  COMPARISON:  None.  FINDINGS: There is no intracranial  hemorrhage, mass or evidence of acute infarction. There is moderate generalized atrophy. There is moderate chronic microvascular ischemic change. There is no significant extra-axial fluid collection.  No acute intracranial findings are evident. The visible portions of the paranasal sinuses are clear.  IMPRESSION: Moderate generalized atrophy and chronic microvascular changes. No acute findings.   Electronically Signed   By: Ellery Plunkaniel R Mitchell M.D.   On: 03/26/2015 01:31   Dg Chest Port 1 View  03/26/2015   CLINICAL DATA:  Dyspnea, weakness  EXAM: PORTABLE CHEST - 1 VIEW  COMPARISON:  08/05/2013  FINDINGS: There is marked aortic tortuosity which appears unchanged. Heart size is normal and unchanged. The lungs are clear. The pulmonary vasculature is normal. No pneumothorax or large effusion is evident.  IMPRESSION: No acute cardiopulmonary findings.   Electronically Signed   By: Ellery Plunkaniel R Mitchell M.D.   On: 03/26/2015 01:13   Dg Foot 2 Views Left  03/26/2015   CLINICAL DATA:  Gangrene  EXAM: LEFT FOOT - 2 VIEW  COMPARISON:  October 18, 2013  FINDINGS: Frontal and lateral views were obtained. The patient has had amputation at the level of the medial cuneiform -first metatarsal junction. There is extensive soft tissue air in the medial aspect of the foot distally in the area of amputation. There is dislocation at the second MTP joint site with the second proximal phalanx displaced laterally and proximal to the distal second metatarsal. There is also subluxation at the second PIP joint. No acute fracture seen. Other joint spaces appear intact. There is no overt bony destruction or erosion. There is soft tissue swelling dorsally with spurring in the dorsal midfoot. There is a small posterior calcaneal spur.  IMPRESSION: Soft tissue air medially and distally, consistent with infection. Amputation at the level of the medial cuneiform- first MTP joint without bony destruction in this area. Dislocation at the second MTP  joint with remodeling of the second proximal phalanx. Subluxation at the second PIP joint. No overt bony destruction seen currently. Soft tissue swelling is noted somewhat diffusely. Areas of osteoarthritic change as described.   Electronically Signed   By: Bretta BangWilliam  Woodruff III M.D.   On: 03/26/2015 07:43   Dg Foot 2 Views Right  03/26/2015   CLINICAL DATA:  81 year old male with a history of osteomyelitis and gangrene.  EXAM: RIGHT FOOT - 2 VIEW  COMPARISON:  None.  FINDINGS: No acute fracture line identified.  Rarefaction of the distal right fifth metatarsal with thinning of the overlying soft tissues.  Decreased bone density at the head of the right first metatarsal, in a region of prior osteomyelitis.  Diffuse osteopenia.  Developing Charcot changes of the hindfoot.  Calcifications in the distribution of the anterior tibial artery and posterior tibial artery.  IMPRESSION: Changes of osteomyelitis of the right fifth metatarsal with overlying soft tissue wound.  Questionable ongoing osteomyelitis of the right first metatarsal, in a region of prior infection.  Developing Charcot joint of the hindfoot.  Extensive vascular calcifications compatible with infrapopliteal disease. If the patient has not yet been evaluated for claudication/CLI, noninvasive testing with ABI, segmental duplex, and segmental pulse volume recording may be considered as well as office based evaluation.  These results were called by telephone at the time of interpretation on 03/26/2015 at 7:52 am to Dr. Darnelle Catalanama, who verbally acknowledged these results.  Signed,  Yvone Neu. Loreta Ave, DO  [facility]  [facility]   Electronically Signed   By: Gilmer Mor D.O.   On: 03/26/2015 07:52     Medical Consultants:    Dr. Aldean Baker, Orthopedic Surgery  Dr. Cliffton Asters, ID  Dr. Christella Hartigan, Gastroenterology  Anti-Infectives:    Vancomycin 03/26/15--->  Zosyn 03/26/15--->  Subjective:   Vincent Miranda denies significant foot pain.  No N/V.  No dyspnea.  Appetite has been OK.  Objective:    Filed Vitals:   03/29/15 2200 03/30/15 0000 03/30/15 0400 03/30/15 0700  BP: 123/67 104/61 118/67 157/82  Pulse: 80 70 71 68  Temp:  97.7 F (36.5 C) 98.1 F (36.7 C)   TempSrc:  Oral Oral   Resp: Height:      Weight:      SpO2:  97%  99%    Intake/Output Summary (Last 24 hours) at 03/30/15 0728 Last data filed at 03/30/15 0530  Gross per 24 hour  Intake 2768.75 ml  Output   1175 ml  Net 1593.75 ml    Exam: Gen:  NAD, awake/alert Cardiovascular:  RRR, No M/R/G Respiratory:  Lungs CTAB Gastrointestinal:  Abdomen soft, NT/ND, + BS Extremities: See pictures taken 03/27/15 in progress note, currently dressed  Data Reviewed:    Labs: Basic Metabolic Panel:  Recent Labs Lab 03/26/15 0054 03/26/15 1249 03/27/15 0316 03/28/15 0329 03/29/15 0320  NA 147* 149* 149* 152* 144  K 3.3* 3.1* 3.3* 4.0 4.0  CL 112* 118* 122* 125* 120*  CO2 21* 23 19* 20* 20*  GLUCOSE 171* 119* 93 100* 120*  BUN 101* 96* 83* 60* 41*  CREATININE 2.98* 2.84* 2.59* 2.36* 2.10*  CALCIUM 9.8 9.4 9.0 9.2 9.1   GFR Estimated Creatinine Clearance: 22.8 mL/min (by C-G formula based on Cr of 2.1). Liver Function Tests:  Recent Labs Lab 03/26/15 0054  AST 28  ALT 30  ALKPHOS 126  BILITOT 0.4  PROT 7.5  ALBUMIN 1.7*   Coagulation profile  Recent Labs Lab 03/26/15 0152  INR 1.33    CBC:  Recent Labs Lab 03/26/15 0054 03/26/15 1249 03/26/15 2259 03/27/15 0316 03/28/15 0329 03/29/15 0320  WBC 12.5*  --  13.3* 12.6* 11.8* 9.4  NEUTROABS 10.4*  --   --   --   --   --   HGB 7.2* 5.6* 8.3* 7.4* 8.7* 8.4*  HCT 22.6* 18.2* 26.0* 22.8* 26.9* 25.4*  MCV 78.2  --  82.3 80.0 82.5 82.2  PLT 278  --  180 175 169 158   Cardiac Enzymes:  Recent Labs Lab 03/26/15 0258 03/27/15 0815 03/27/15 1412 03/27/15 1920  TROPONINI 0.07* 0.06* 0.07* 0.07*   CBG:  Recent  Labs Lab 03/26/15 0126  GLUCAP 140*   Sepsis Labs:  Recent Labs Lab 03/26/15 0258 03/26/15 0532 03/26/15 2259 03/27/15 0316 03/28/15 0329 03/29/15 0320  WBC  --   --  13.3* 12.6* 11.8* 9.4  LATICACIDVEN 2.2* 3.0*  --   --   --   --    Microbiology Recent Results (from the past 240 hour(s))  Culture, blood (routine x 2)     Status: None (Preliminary result)   Collection Time: 03/26/15 12:57 AM  Result Value Ref Range Status   Specimen Description BLOOD LEFT ARM  Final   Special Requests BOTTLES DRAWN AEROBIC ONLY [facility]  Final   Culture   Final  BLOOD CULTURE RECEIVED NO GROWTH TO DATE CULTURE WILL BE HELD FOR 5 DAYS BEFORE ISSUING A FINAL NEGATIVE REPORT Performed at [facility]    Report Status PENDING  Incomplete  MRSA PCR Screening     Status: None   Collection Time: 03/26/15  4:05 AM  Result Value Ref Range Status   MRSA by PCR NEGATIVE NEGATIVE Final    Comment:        The GeneXpert MRSA Assay (FDA approved for NASAL specimens only), is one component of a comprehensive MRSA colonization surveillance program. It is not intended to diagnose MRSA infection nor to guide or monitor treatment for MRSA infections.   Culture, blood (routine x 2)     Status: None (Preliminary result)   Collection Time: 03/26/15  5:32 AM  Result Value Ref Range Status   Specimen Description BLOOD LEFT HAND  Final   Special Requests BOTTLES DRAWN AEROBIC ONLY 5CC  Final   Culture   Final           BLOOD CULTURE RECEIVED NO GROWTH TO DATE CULTURE WILL BE HELD FOR 5 DAYS BEFORE ISSUING A FINAL NEGATIVE REPORT Performed at [facility]    Report Status PENDING  Incomplete  Wound culture     Status: None (Preliminary result)   Collection Time: 03/29/15 12:41 PM  Result Value Ref Range Status   Specimen Description WOUND LEFT FOOT  Final   Special Requests Normal  Final   Gram Stain PENDING  Incomplete   Culture   Final    Culture reincubated for better  growth Performed at [facility]    Report Status PENDING  Incomplete     Medications:   . antiseptic oral rinse  7 mL Mouth Rinse BID  . feeding supplement (ENSURE ENLIVE)  237 mL Oral BID BM  . ferrous sulfate  325 mg Oral Daily  . pantoprazole  40 mg Oral BID  . piperacillin-tazobactam (ZOSYN)  IV  3.375 g Intravenous 3 times per day  . sodium chloride  1,000 mL Intravenous Once  . vancomycin  1,000 mg Intravenous Q48H   Continuous Infusions: . dextrose 125 mL/hr at 03/29/15 2251    Time spent: 25 minutes.   LOS: 4 days   Browning Southwood  [facility] Pager [phone]. If unable to reach me by pager, please call my cell phone at [phone].  *Please refer to [email or web address], password [email or web address] to get updated schedule on who will round on this patient, as hospitalists switch teams weekly. If 7PM-7AM, please contact night-coverage at [email or web address], password [email or web address] for any overnight needs.  03/30/2015, 7:28 AM

## 2015-03-31 LAB — CBC
HEMATOCRIT: 26.4 % — AB (ref 39.0–52.0)
HEMOGLOBIN: 8.4 g/dL — AB (ref 13.0–17.0)
MCH: 26.3 pg (ref 26.0–34.0)
MCHC: 31.8 g/dL (ref 30.0–36.0)
MCV: 82.8 fL (ref 78.0–100.0)
Platelets: 122 10*3/uL — ABNORMAL LOW (ref 150–400)
RBC: 3.19 MIL/uL — ABNORMAL LOW (ref 4.22–5.81)
RDW: 18.6 % — ABNORMAL HIGH (ref 11.5–15.5)
WBC: 9.1 10*3/uL (ref 4.0–10.5)

## 2015-03-31 LAB — BASIC METABOLIC PANEL
Anion gap: 6 (ref 5–15)
BUN: 26 mg/dL — ABNORMAL HIGH (ref 6–20)
CO2: 20 mmol/L — ABNORMAL LOW (ref 22–32)
CREATININE: 2.11 mg/dL — AB (ref 0.61–1.24)
Calcium: 9 mg/dL (ref 8.9–10.3)
Chloride: 114 mmol/L — ABNORMAL HIGH (ref 101–111)
GFR calc non Af Amer: 26 mL/min — ABNORMAL LOW (ref >60)
GFR, EST AFRICAN AMERICAN: 31 mL/min — AB (ref >60)
Glucose, Bld: 104 mg/dL — ABNORMAL HIGH (ref 70–99)
Potassium: 3.5 mmol/L (ref 3.5–5.1)
Sodium: 140 mmol/L (ref 135–145)

## 2015-03-31 NOTE — Progress Notes (Signed)
INFECTIOUS DISEASE PROGRESS NOTE  ID: Vincent Miranda is a 81 y.o. male with  Principal Problem:   Atherosclerotic peripheral vascular disease with gangrene Active Problems:   AKI (acute kidney injury)   Protein-calorie malnutrition, severe   Hypertension   CKD (chronic kidney disease), stage III   Anemia of chronic disease   Dehydration with hypernatremia   Uremia   Acute on Chronic blood loss anemia   Occult blood positive stool   Osteomyelitis of left foot   Gangrene of foot   Severe sepsis with acute organ dysfunction   Elevated troponin   Acute blood loss anemia   Gangrene  Subjective: "I feel good" Eating breakfast well.   Abtx:  Anti-infectives    Start     Dose/Rate Route Frequency Ordered Stop   03/31/15 0530  vancomycin (VANCOCIN) IVPB 750 mg/150 ml premix     750 mg 150 mL/hr over 60 Minutes Intravenous Every 24 hours 03/30/15 0822     03/29/15 1400  piperacillin-tazobactam (ZOSYN) IVPB 3.375 g     3.375 g 12.5 mL/hr over 240 Minutes Intravenous 3 times per day 03/29/15 0818     03/26/15 0400  vancomycin (VANCOCIN) IVPB 1000 mg/200 mL premix  Status:  Discontinued     1,000 mg 200 mL/hr over 60 Minutes Intravenous Every 48 hours 03/26/15 0334 03/30/15 0822   03/26/15 0400  piperacillin-tazobactam (ZOSYN) IVPB 2.25 g  Status:  Discontinued     2.25 g 100 mL/hr over 30 Minutes Intravenous Every 6 hours 03/26/15 0334 03/29/15 0818      Medications:  Scheduled: . allopurinol  100 mg Oral BID AC  . antiseptic oral rinse  7 mL Mouth Rinse BID  . feeding supplement (ENSURE ENLIVE)  237 mL Oral BID BM  . ferrous sulfate  325 mg Oral Daily  . labetalol  200 mg Oral BID  . pantoprazole  40 mg Oral BID  . piperacillin-tazobactam (ZOSYN)  IV  3.375 g Intravenous 3 times per day  . sodium chloride  1,000 mL Intravenous Once  . vancomycin  750 mg Intravenous Q24H    Objective: Vital signs in last 24 hours: Temp:  [97.7 F (36.5 C)-98.1 F (36.7 C)] 98  F (36.7 C) (05/11 0815) Pulse Rate:  [53-81] 72 (05/11 0815) Resp:  [14-25] 15 (05/11 0815) BP: (100-152)/(49-95) 152/66 mmHg (05/11 0800) SpO2:  [94 %-100 %] 97 % (05/11 0815)   General appearance: alert, cooperative and no distress Resp: clear to auscultation bilaterally Cardio: regular rate and rhythm GI: normal findings: bowel sounds normal and soft, non-tender Incision/Wound: BLE dressed, odor unchanged.   Lab Results  Recent Labs  03/29/15 0320 03/31/15 0231  WBC 9.4 9.1  HGB 8.4* 8.4*  HCT 25.4* 26.4*  NA 144 140  K 4.0 3.5  CL 120* 114*  CO2 20* 20*  BUN 41* 26*  CREATININE 2.10* 2.11*   Liver Panel No results for input(s): PROT, ALBUMIN, AST, ALT, ALKPHOS, BILITOT, BILIDIR, IBILI in the last 72 hours. Sedimentation Rate No results for input(s): ESRSEDRATE in the last 72 hours. C-Reactive Protein No results for input(s): CRP in the last 72 hours.  Microbiology: Recent Results (from the past 240 hour(s))  Culture, blood (routine x 2)     Status: None (Preliminary result)   Collection Time: 03/26/15 12:57 AM  Result Value Ref Range Status   Specimen Description BLOOD LEFT ARM  Final   Special Requests BOTTLES DRAWN AEROBIC ONLY [facility]7CC  Final   Culture  Final           BLOOD CULTURE RECEIVED NO GROWTH TO DATE CULTURE WILL BE HELD FOR 5 DAYS BEFORE ISSUING A FINAL NEGATIVE REPORT Performed at [facility]    Report Status PENDING  Incomplete  MRSA PCR Screening     Status: None   Collection Time: 03/26/15  4:05 AM  Result Value Ref Range Status   MRSA by PCR NEGATIVE NEGATIVE Final    Comment:        The GeneXpert MRSA Assay (FDA approved for NASAL specimens only), is one component of a comprehensive MRSA colonization surveillance program. It is not intended to diagnose MRSA infection nor to guide or monitor treatment for MRSA infections.   Culture, blood (routine x 2)     Status: None (Preliminary result)   Collection Time: 03/26/15  5:32  AM  Result Value Ref Range Status   Specimen Description BLOOD LEFT HAND  Final   Special Requests BOTTLES DRAWN AEROBIC ONLY 5CC  Final   Culture   Final           BLOOD CULTURE RECEIVED NO GROWTH TO DATE CULTURE WILL BE HELD FOR 5 DAYS BEFORE ISSUING A FINAL NEGATIVE REPORT Performed at [facility]    Report Status PENDING  Incomplete  Wound culture     Status: None (Preliminary result)   Collection Time: 03/29/15 12:41 PM  Result Value Ref Range Status   Specimen Description WOUND LEFT FOOT  Final   Special Requests Normal  Final   Gram Stain   Final    FEW WBC PRESENT,BOTH PMN AND MONONUCLEAR NO SQUAMOUS EPITHELIAL CELLS SEEN RARE GRAM POSITIVE COCCI IN PAIRS Performed at [facility]    Culture   Final    MULTIPLE ORGANISMS PRESENT, NONE PREDOMINANT Performed at [facility]    Report Status PENDING  Incomplete    Studies/Results: No results found.   Assessment/Plan: Sepsis Osteomyelitis, gangrene of BLE (pt refusing surgery) PVD CRI Severe protein calorie malnutrition + Troponin  Total days of antibiotics: 6 vanco/zosyn  Wound Cx is polymicrobial, in process. hopefully can give him long term IV (6 weeks) then convert him to po regiemen. However, given depth/degree of his wounds, I am not sure we will do better than suppress him (at best) Cr stable.  Needs CSW/CM help for home anbx         Vincent Miranda Infectious Diseases (pager) [phone] [email or web address] 03/31/2015, 9:34 AM  LOS: 5 days

## 2015-03-31 NOTE — Progress Notes (Signed)
Nutrition Follow-up  DOCUMENTATION CODES:  Severe malnutrition in context of chronic illness  INTERVENTION:   Ensure Enlive (each supplement provides 350kcal and 20 grams of protein) BID  NUTRITION DIAGNOSIS:  Increased nutrient needs related to wound healing as evidenced by estimated needs, ongoing  GOAL:  Patient will meet greater than or equal to 90% of their needs, currently unmet  MONITOR:  PO intake, Supplement acceptance, Labs, Weight trends, Skin, I & O's  REASON FOR ASSESSMENT:  Malnutrition Screening Tool  ASSESSMENT: 81 y.o. Male with ongoing trouble with gangrene of toes of both feet for the past couple of months. Currently on doxycycline as outpatient. Patient presents to the ED with generalized weakness, fatigue, decreased PO intake, especially over the past 2-3 days.  Patient somewhat confused upon RD visit.  Lunch tray untouched on tray table.  No % PO intake available per flowsheet records.  Malnutrition ongoing.  Receiving Ensure Enlive oral nutrition supplements.  Infections Disease note reviewed.  Hopeful for long term IV (6 weeks) ABX.  Height:  Ht Readings from Last 1 Encounters:  03/26/15 5\' 11"  (1.803 m)    Weight:  Wt Readings from Last 1 Encounters:  03/26/15 146 lb 6.2 oz (66.4 kg)    Ideal Body Weight:  78 kg  Wt Readings from Last 10 Encounters:  03/26/15 146 lb 6.2 oz (66.4 kg)  10/18/13 173 lb 8 oz (78.699 kg)  08/05/13 172 lb 9.9 oz (78.3 kg)    BMI:  Body mass index is 20.43 kg/(m^2).  Estimated Nutritional Needs:  Kcal:  1700-1900  Protein:  90-100 gm  Fluid:  1.7-1.9 L  Skin:  bilateral foot gangrene/osteomyelitis   Diet Order:  Diet Carb Modified Fluid consistency:: Thin; Room service appropriate?: Yes  EDUCATION NEEDS:  No education needs identified at this time   Intake/Output Summary (Last 24 hours) at 03/31/15 1355 Last data filed at 03/31/15 1259  Gross per 24 hour  Intake 2636.67 ml  Output   2575  ml  Net  61.67 ml    Last BM:  Unknown   Maureen ChattersKatie Haizley Cannella, RD, LDN Pager #: [phone] After-Hours Pager #: [phone]

## 2015-03-31 NOTE — Progress Notes (Signed)
Progress Note   Vincent Miranda RUE:[id number] DOB: [birth year] DOA: 03/26/2015 PCP: Burtis JunesBLOUNT,ALVIN VINCENT, MD   Brief Narrative:   Vincent Miranda is an 81 y.o. male with a PMH of bilateral foot gangrene, prior history of amputation of the great toe on the left which has become infected again and now has exposed bone with purulent drainage as well as 2 toes on his right foot that are frankly gangrenous, treated with doxycycline as an outpatient, was admitted 03/26/15 with chief complaint of generalized fatigue. Orthopedic surgery has been consulted with plans for transtibial amputations 03/28/15. Patient also has GI bleeding and has required 2 units of blood during hospital stay. Patient's wife has indicated that she does not want her husband to have transtibial amputations and despite multiple attempts to educate her, continues to insist that he can heal his wounds since he has done so in the past.  ID consulted for assistance with medical management in the face of surgical refusal.  Assessment/Plan:      Severe sepsis with acute organ dysfunction secondary to bilateral foot gangrene/osteomyelitis of left foot - Continue empiric antibiotics with vancomycin and Zosyn. - Follow-up final blood cultures, which are negative to date. - Seen by Dr. Lajoyce Cornersuda 03/26/15, who recommended transtibial amputations however patient would not consent. - ABIs performed showing mild reduction in arterial flow on the right and moderate reduction in arterial flow on the left. - Initially required fluid volume resuscitation for hypotension. Remains hemodynamically stable. - ID following for recommendations regarding antibiotic therapy/duration. - Follow-up wound cultures done at the bedside, still having any microbial growth, so continue with broad-spectrum IV antibiotics pending further results, scars with ID. - Given refusal to have surgery, will D/C home on antibiotics once ID makes recommendations. - Re-consult  WOC for home wound care recommendations.   Hypertension - Resume Labetalol. Lasix remains on hold.    Severe protein calorie malnutrition - Nutritional supplements ordered.    Troponin I elevation - Likely demand ischemia. - Troponins mildly elevated. - Hold aspirin given GI bleeding. Resume beta blocker.. - Follow-up 2 D Echo, 2-D echo done 5/9, results still pending.    Hypokalemia - Resolved with supplementation.    AKI (acute kidney injury) in the setting of stage III chronic kidney disease / uremia - Baseline creatinine 1.7-1.9. Creatinine 2.98 ---> 2.59---> 2.36 ---> 2.10 with IV fluids. - Appears to be prerenal, continue to hydrate. Creatinine almost back to baseline values.    Acute on chronic blood loss anemia/occult blood positive stool with history of anemia of chronic disease - Baseline hemoglobin appears to be around 8.3-8.6. Admission hemoglobin lower than usual baseline values. - Stools heme positive. - Status post a total of 3 units of PRBCs. Hemoglobin stable post transfusion. - Continue oral iron therapy. - Holding aspirin. - Evaluated by GI with no current recommendations for further evaluation.    Dehydration with hypernatremia - Resolved with hypotonic IV fluids.    DVT Prophylaxis - No Lovenox/heparin given heme positive stools.  Code Status: Full. Family Communication: None at bedside Disposition Plan: Home in 1-2 days with wound care, and possible IV antibiotics for long-term, when wound cultures final and ID gives recommendations about IV antibiotic therapy.  May need PICC if IV antibiotics warranted.  If he is to remain in hospital longer than this, consider LTAC.   IV Access:    Peripheral IV   Procedures and diagnostic studies:   Ct Head Wo Contrast  03/26/2015  CLINICAL DATA:  Weakness and generalized fatigue.  EXAM: CT HEAD WITHOUT CONTRAST  TECHNIQUE: Contiguous axial images were obtained from the base of the skull through the vertex  without intravenous contrast.  COMPARISON:  None.  FINDINGS: There is no intracranial hemorrhage, mass or evidence of acute infarction. There is moderate generalized atrophy. There is moderate chronic microvascular ischemic change. There is no significant extra-axial fluid collection.  No acute intracranial findings are evident. The visible portions of the paranasal sinuses are clear.  IMPRESSION: Moderate generalized atrophy and chronic microvascular changes. No acute findings.   Electronically Signed   By: Ellery Plunk M.D.   On: 03/26/2015 01:31   Dg Chest Port 1 View  03/26/2015   CLINICAL DATA:  Dyspnea, weakness  EXAM: PORTABLE CHEST - 1 VIEW  COMPARISON:  08/05/2013  FINDINGS: There is marked aortic tortuosity which appears unchanged. Heart size is normal and unchanged. The lungs are clear. The pulmonary vasculature is normal. No pneumothorax or large effusion is evident.  IMPRESSION: No acute cardiopulmonary findings.   Electronically Signed   By: Ellery Plunk M.D.   On: 03/26/2015 01:13   Dg Foot 2 Views Left  03/26/2015   CLINICAL DATA:  Gangrene  EXAM: LEFT FOOT - 2 VIEW  COMPARISON:  October 18, 2013  FINDINGS: Frontal and lateral views were obtained. The patient has had amputation at the level of the medial cuneiform -first metatarsal junction. There is extensive soft tissue air in the medial aspect of the foot distally in the area of amputation. There is dislocation at the second MTP joint site with the second proximal phalanx displaced laterally and proximal to the distal second metatarsal. There is also subluxation at the second PIP joint. No acute fracture seen. Other joint spaces appear intact. There is no overt bony destruction or erosion. There is soft tissue swelling dorsally with spurring in the dorsal midfoot. There is a small posterior calcaneal spur.  IMPRESSION: Soft tissue air medially and distally, consistent with infection. Amputation at the level of the medial cuneiform-  first MTP joint without bony destruction in this area. Dislocation at the second MTP joint with remodeling of the second proximal phalanx. Subluxation at the second PIP joint. No overt bony destruction seen currently. Soft tissue swelling is noted somewhat diffusely. Areas of osteoarthritic change as described.   Electronically Signed   By: Bretta Bang III M.D.   On: 03/26/2015 07:43   Dg Foot 2 Views Right  03/26/2015   CLINICAL DATA:  81 year old male with a history of osteomyelitis and gangrene.  EXAM: RIGHT FOOT - 2 VIEW  COMPARISON:  None.  FINDINGS: No acute fracture line identified.  Rarefaction of the distal right fifth metatarsal with thinning of the overlying soft tissues.  Decreased bone density at the head of the right first metatarsal, in a region of prior osteomyelitis.  Diffuse osteopenia.  Developing Charcot changes of the hindfoot.  Calcifications in the distribution of the anterior tibial artery and posterior tibial artery.  IMPRESSION: Changes of osteomyelitis of the right fifth metatarsal with overlying soft tissue wound.  Questionable ongoing osteomyelitis of the right first metatarsal, in a region of prior infection.  Developing Charcot joint of the hindfoot.  Extensive vascular calcifications compatible with infrapopliteal disease. If the patient has not yet been evaluated for claudication/CLI, noninvasive testing with ABI, segmental duplex, and segmental pulse volume recording may be considered as well as office based evaluation.  These results were called by telephone at the time of  interpretation on 03/26/2015 at 7:52 am to Dr. Darnelle Catalan, who verbally acknowledged these results.  Signed,  Yvone Neu. Loreta Ave, DO  [facility]  [facility]   Electronically Signed   By: Gilmer Mor D.O.   On: 03/26/2015 07:52     Medical Consultants:    Dr. Aldean Baker, Orthopedic Surgery  Dr. Cliffton Asters, ID  Dr. Christella Hartigan,  Gastroenterology  Anti-Infectives:    Vancomycin 03/26/15--->  Zosyn 03/26/15--->  Subjective:   Vincent Miranda denies significant foot pain.  No N/V.  No dyspnea.  Appetite has been OK.  Objective:    Filed Vitals:   03/31/15 0600 03/31/15 0722 03/31/15 0800 03/31/15 0815  BP: 137/51 114/61 152/66   Pulse: 58 56 67 72  Temp:    98 F (36.7 C)  TempSrc:    Oral  Resp: Height:      Weight:      SpO2:    97%    Intake/Output Summary (Last 24 hours) at 03/31/15 1136 Last data filed at 03/31/15 6962  Gross per 24 hour  Intake 2686.67 ml  Output   1575 ml  Net 1111.67 ml    Exam: Gen:  NAD, awake/alert Cardiovascular:  RRR, No M/R/G Respiratory:  Lungs CTAB Gastrointestinal:  Abdomen soft, NT/ND, + BS Extremities: See pictures taken 03/27/15 in progress note, currently dressed  Data Reviewed:    Labs: Basic Metabolic Panel:  Recent Labs Lab 03/26/15 1249 03/27/15 0316 03/28/15 0329 03/29/15 0320 03/31/15 0231  NA 149* 149* 152* 144 140  K 3.1* 3.3* 4.0 4.0 3.5  CL 118* 122* 125* 120* 114*  CO2 23 19* 20* 20* 20*  GLUCOSE 119* 93 100* 120* 104*  BUN 96* 83* 60* 41* 26*  CREATININE 2.84* 2.59* 2.36* 2.10* 2.11*  CALCIUM 9.4 9.0 9.2 9.1 9.0   GFR Estimated Creatinine Clearance: 22.7 mL/min (by C-G formula based on Cr of 2.11). Liver Function Tests:  Recent Labs Lab 03/26/15 0054  AST 28  ALT 30  ALKPHOS 126  BILITOT 0.4  PROT 7.5  ALBUMIN 1.7*   Coagulation profile  Recent Labs Lab 03/26/15 0152  INR 1.33    CBC:  Recent Labs Lab 03/26/15 0054  03/26/15 2259 03/27/15 0316 03/28/15 0329 03/29/15 0320 03/31/15 0231  WBC 12.5*  --  13.3* 12.6* 11.8* 9.4 9.1  NEUTROABS 10.4*  --   --   --   --   --   --   HGB 7.2*  < > 8.3* 7.4* 8.7* 8.4* 8.4*  HCT 22.6*  < > 26.0* 22.8* 26.9* 25.4* 26.4*  MCV 78.2  --  82.3 80.0 82.5 82.2 82.8  PLT 278  --  180 175 169 158 122*  < > = values in this interval not  displayed. Cardiac Enzymes:  Recent Labs Lab 03/26/15 0258 03/27/15 0815 03/27/15 1412 03/27/15 1920  TROPONINI 0.07* 0.06* 0.07* 0.07*   CBG:  Recent Labs Lab 03/26/15 0126  GLUCAP 140*   Sepsis Labs:  Recent Labs Lab 03/26/15 0258 03/26/15 0532  03/27/15 0316 03/28/15 0329 03/29/15 0320 03/31/15 0231  WBC  --   --   < > 12.6* 11.8* 9.4 9.1  LATICACIDVEN 2.2* 3.0*  --   --   --   --   --   < > = values in this interval not displayed. Microbiology Recent Results (from the past 240 hour(s))  Culture, blood (routine x 2)     Status:  None (Preliminary result)   Collection Time: 03/26/15 12:57 AM  Result Value Ref Range Status   Specimen Description BLOOD LEFT ARM  Final   Special Requests BOTTLES DRAWN AEROBIC ONLY 7CC  Final   Culture   Final           BLOOD CULTURE RECEIVED NO GROWTH TO DATE CULTURE WILL BE HELD FOR 5 DAYS BEFORE ISSUING A FINAL NEGATIVE REPORT [facility]formed at [facility]    Report Status PENDING  Incomplete  MRSA PCR Screening     Status: None   Collection Time: 03/26/15  4:05 AM  Result Value Ref Range Status   MRSA by PCR NEGATIVE NEGATIVE Final    Comment:        The GeneXpert MRSA Assay (FDA approved for NASAL specimens only), is one component of a comprehensive MRSA colonization surveillance program. It is not intended to diagnose MRSA infection nor to guide or monitor treatment for MRSA infections.   Culture, blood (routine x 2)     Status: None (Preliminary result)   Collection Time: 03/26/15  5:32 AM  Result Value Ref Range Status   Specimen Description BLOOD LEFT HAND  Final   Special Requests BOTTLES DRAWN AEROBIC ONLY 5CC  Final   Culture   Final           BLOOD CULTURE RECEIVED NO GROWTH TO DATE CULTURE WILL BE HELD FOR 5 DAYS BEFORE ISSUING A FINAL NEGATIVE REPORT Performed at [facility]    Report Status PENDING  Incomplete  Wound culture     Status: None (Preliminary result)   Collection Time: 03/29/15  12:41 PM  Result Value Ref Range Status   Specimen Description WOUND LEFT FOOT  Final   Special Requests Normal  Final   Gram Stain   Final    FEW WBC PRESENT,BOTH PMN AND MONONUCLEAR NO SQUAMOUS EPITHELIAL CELLS SEEN RARE GRAM POSITIVE COCCI IN PAIRS Performed at [facility]    Culture   Final    MULTIPLE ORGANISMS PRESENT, NONE PREDOMINANT Performed at [facility]    Report Status PENDING  Incomplete     Medications:   . allopurinol  100 mg Oral BID AC  . antiseptic oral rinse  7 mL Mouth Rinse BID  . feeding supplement (ENSURE ENLIVE)  237 mL Oral BID BM  . ferrous sulfate  325 mg Oral Daily  . labetalol  200 mg Oral BID  . pantoprazole  40 mg Oral BID  . piperacillin-tazobactam (ZOSYN)  IV  3.375 g Intravenous 3 times per day  . sodium chloride  1,000 mL Intravenous Once  . vancomycin  750 mg Intravenous Q24H   Continuous Infusions: . dextrose 125 mL/hr at 03/31/15 0645    Time spent: 25 minutes.   LOS: 5 days   [facility], Tauna Macfarlane  MD [facility] Pager [phone].   *Please refer to [email or web address], password [email or web address] to get updated schedule on who will round on this patient, as hospitalists switch teams weekly. If 7PM-7AM, please contact night-coverage at [email or web address], password [email or web address] for any overnight needs.  03/31/2015, 11:36 AM

## 2015-03-31 NOTE — Consult Note (Addendum)
WOC wound consult note Reason for Consult: Pt has documented gangreneous changes and osteomylitis to feet and had previous assessment performed by Dr Lajoyce Cornersuda of the ortho service.  Pt is declining surgery at this time against medical advice, according to the EMR.  Consult requested for dressing change orders for staff nurses, and then for family members to perform after discharge. Topical treatment will NOT be effective to promote healing under these conditions; goals of dressing changes are to minimize pain and control odor and drainage. No family members were present to discuss plan of care during consult.  Pt states his wife has been performing dressing changes prior to admission. Wound type: Full thickness to bilat feet Measurement:Left lower calf 7X3X.2cm, 100% red and moist, small amt yellow drainage. Left plantar foot 13X5X1cm; deepest area is near toe.  Wound 80% red, 20% yellow slough, mod amt foul smelling odor and yellow drainage.  Anterior foot fluctuant, and toe is loosely attached dry eschar, 4X2cm with strong foul odor. Wound bed: Right inner foot 10X7X.2cm, 20% loose yellow slough, 80% red, mod amt yellow drainage, strong foul odor. Right outer foot 4.5X2X.2cm, 90% red and moist, 10% yellow slough, small amt yellow drainage, no odor. Right anterior foot 5X3X.2cm,90% red and moist, 10% yellow slough, small amt yellow drainage, no odor. Right anterior foot 6X3X.2cm,90% red and moist, 10% yellow slough, small amt yellow drainage, no odor. Right small toe eschar Dressing procedure/placement/frequency: Dressing change orders for nurses Q day, and for family members after discharge:  Apply Vaseline gauze to open wounds on bilat feet Q day, and apply moist fluffed gauze to left anterior foot wound Q day, then cover bilat feet with ABD pads and kerlex   Please re-consult if further assistance is needed.  Thank-you,  Cammie Mcgeeawn Arval Brandstetter MSN, RN, CWOCN, [place]-AP, CNS [phone]

## 2015-03-31 NOTE — Progress Notes (Signed)
Patient had 8 beats of ventricular tachycardia returning to sinus rhythm.Patient asymptomatic denies chest pain,palpitations ,or shortness of breath.Blood pressure 120/48 will continue to monitor patient.

## 2015-03-31 NOTE — Care Management Note (Signed)
Case Management Note  Patient Details  Name: Vincent Miranda MRN: [id number] Date of Birth: 03/07/1925  Subjective/Objective:                    Action/Plan:   Expected Discharge Date:                  Expected Discharge Plan:  Home w Home Health Services  In-House Referral:     Discharge planning Services  CM Consult  Post Acute Care Choice:  Home Health, Durable Medical Equipment Choice offered to:  Spouse  DME Arranged:    DME Agency:     HH Arranged:    HH Agency:     Status of Service:  In process, will continue to follow  Medicare Important Message Given:  Yes Date Medicare IM Given:  03/31/15 Medicare IM give by:  Verdis PrimeHenrietta Uriah Trueba RN Date Additional Medicare IM Given:    Additional Medicare Important Message give by:     If discussed at Long Length of Stay Meetings, dates discussed:    Additional Comments:  Discussed home IV antibiotic therapy with pt and spouse.  Spouse states her job is flexible and she will be able to assist pt as needed and learn to administer IV antibiotics.  Provided list of agencies - she wants to make her choice later today and notify case manager.  Azari Hasler, OcheyedanHenrietta T, RN 03/31/2015, 3:11 PM

## 2015-04-01 LAB — WOUND CULTURE: Special Requests: NORMAL

## 2015-04-01 LAB — CULTURE, BLOOD (ROUTINE X 2)
CULTURE: NO GROWTH
Culture: NO GROWTH

## 2015-04-01 MED ORDER — SODIUM CHLORIDE 0.9 % IV SOLN
INTRAVENOUS | Status: DC
  Administered 2015-04-01 – 2015-04-02 (×2): via INTRAVENOUS

## 2015-04-01 MED ORDER — SODIUM CHLORIDE 0.9 % IV SOLN
500.0000 mg | INTRAVENOUS | Status: DC
  Administered 2015-04-01 – 2015-04-02 (×2): 0.5 g via INTRAVENOUS
  Filled 2015-04-01 (×2): qty 0.5

## 2015-04-01 MED ORDER — ENSURE PUDDING PO PUDG
1.0000 | Freq: Three times a day (TID) | ORAL | Status: DC
  Filled 2015-04-01 (×8): qty 1

## 2015-04-01 MED ORDER — SODIUM CHLORIDE 0.9 % IJ SOLN: 10.0000 mL | INTRAMUSCULAR | Status: DC | PRN

## 2015-04-01 MED ORDER — RISAQUAD PO CAPS
2.0000 | ORAL_CAPSULE | Freq: Every day | ORAL | Status: DC
  Administered 2015-04-01 – 2015-04-02 (×2): 2 via ORAL
  Filled 2015-04-01 (×3): qty 2

## 2015-04-01 NOTE — Progress Notes (Addendum)
Progress Note   Vincent Miranda HQI:[id number] DOB: [birth year] DOA: 03/26/2015 PCP: Burtis Junes, MD   Brief Narrative:   Vincent Miranda is an 81 y.o. male with a PMH of bilateral foot gangrene, prior history of amputation of the great toe on the left which has become infected again and now has exposed bone with purulent drainage as well as 2 toes on his right foot that are frankly gangrenous, treated with doxycycline as an outpatient, was admitted 03/26/15 with chief complaint of generalized fatigue. Orthopedic surgery has been consulted with plans for transtibial amputations 03/28/15. Patient also has GI bleeding and has required 2 units of blood during hospital stay. Patient's wife has indicated that she does not want her husband to have transtibial amputations and despite multiple attempts to educate her, continues to insist that he can heal his wounds since he has done so in the past.  ID consulted for assistance with medical management in the face of surgical refusal.  Assessment/Plan:      Severe sepsis with acute organ dysfunction secondary to bilateral foot gangrene/osteomyelitis of left foot -  empiric antibiotics with vancomycin and Zosyn. - Follow-up final blood cultures, which are negative to date. - Seen by Dr. Lajoyce Corners 03/26/15, who recommended transtibial amputations however patient would not consent. - ABIs performed showing mild reduction in arterial flow on the right and moderate reduction in arterial flow on the left. - Initially required fluid volume resuscitation for hypotension. Remains hemodynamically stable. - ID following for recommendations regarding antibiotic therapy/duration. Treated total of 7 days of IV vancomycin and Zosyn, wound cultures is a polymicrobial, they recommend total of 6 weeks of IV Vanco and Invanz. - Wound care consult appreciated, continue with wound care, you to follow with wound center as an outpatient.   Hypertension - Actually  blood pressure on the lower side, will discontinue labetalol    Severe protein calorie malnutrition - Nutritional supplements ordered. Does not like Ensure, will try pudding.    Troponin I elevation - Likely demand ischemia. - Troponins mildly elevated. - Hold aspirin given GI bleeding. Resume beta blocker.. - Follow-up 2 D Echo, 2-D echo done 5/9, results still pending.    Hypokalemia - Resolved with supplementation.    AKI (acute kidney injury) in the setting of stage III chronic kidney disease / uremia - Baseline creatinine 1.7-1.9. Creatinine 2.98 ---> 2.59---> 2.36 ---> 2.10 with IV fluids. - Appears to be prerenal, continue to hydrate. Creatinine almost back to baseline values.    Acute on chronic blood loss anemia/occult blood positive stool with history of anemia of chronic disease - Baseline hemoglobin appears to be around 8.3-8.6. Admission hemoglobin lower than usual baseline values. - Stools heme positive. - Status post a total of 3 units of PRBCs. Hemoglobin stable post transfusion. - Continue oral iron therapy. - Holding aspirin. - Evaluated by GI with no current recommendations for further evaluation.    Dehydration with hypernatremia - Resolved with hypotonic IV fluids.    DVT Prophylaxis - No Lovenox/heparin given heme positive stools.  Code Status: Full. Family Communication: None at bedside Disposition Plan: Home in 1 days with wound care, and  IV antibiotics.   IV Access:    Peripheral IV   Procedures and diagnostic studies:   Ct Head Wo Contrast  03/26/2015   CLINICAL DATA:  Weakness and generalized fatigue.  EXAM: CT HEAD WITHOUT CONTRAST  TECHNIQUE: Contiguous axial images were obtained from the base of the skull through  the vertex without intravenous contrast.  COMPARISON:  None.  FINDINGS: There is no intracranial hemorrhage, mass or evidence of acute infarction. There is moderate generalized atrophy. There is moderate chronic microvascular  ischemic change. There is no significant extra-axial fluid collection.  No acute intracranial findings are evident. The visible portions of the paranasal sinuses are clear.  IMPRESSION: Moderate generalized atrophy and chronic microvascular changes. No acute findings.   Electronically Signed   By: Ellery Plunkaniel R Mitchell M.D.   On: 03/26/2015 01:31   Dg Chest Port 1 View  03/26/2015   CLINICAL DATA:  Dyspnea, weakness  EXAM: PORTABLE CHEST - 1 VIEW  COMPARISON:  08/05/2013  FINDINGS: There is marked aortic tortuosity which appears unchanged. Heart size is normal and unchanged. The lungs are clear. The pulmonary vasculature is normal. No pneumothorax or large effusion is evident.  IMPRESSION: No acute cardiopulmonary findings.   Electronically Signed   By: Ellery Plunkaniel R Mitchell M.D.   On: 03/26/2015 01:13   Dg Foot 2 Views Left  03/26/2015   CLINICAL DATA:  Gangrene  EXAM: LEFT FOOT - 2 VIEW  COMPARISON:  October 18, 2013  FINDINGS: Frontal and lateral views were obtained. The patient has had amputation at the level of the medial cuneiform -first metatarsal junction. There is extensive soft tissue air in the medial aspect of the foot distally in the area of amputation. There is dislocation at the second MTP joint site with the second proximal phalanx displaced laterally and proximal to the distal second metatarsal. There is also subluxation at the second PIP joint. No acute fracture seen. Other joint spaces appear intact. There is no overt bony destruction or erosion. There is soft tissue swelling dorsally with spurring in the dorsal midfoot. There is a small posterior calcaneal spur.  IMPRESSION: Soft tissue air medially and distally, consistent with infection. Amputation at the level of the medial cuneiform- first MTP joint without bony destruction in this area. Dislocation at the second MTP joint with remodeling of the second proximal phalanx. Subluxation at the second PIP joint. No overt bony destruction seen  currently. Soft tissue swelling is noted somewhat diffusely. Areas of osteoarthritic change as described.   Electronically Signed   By: Bretta BangWilliam  Woodruff III M.D.   On: 03/26/2015 07:43   Dg Foot 2 Views Right  03/26/2015   CLINICAL DATA:  81 year old male with a history of osteomyelitis and gangrene.  EXAM: RIGHT FOOT - 2 VIEW  COMPARISON:  None.  FINDINGS: No acute fracture line identified.  Rarefaction of the distal right fifth metatarsal with thinning of the overlying soft tissues.  Decreased bone density at the head of the right first metatarsal, in a region of prior osteomyelitis.  Diffuse osteopenia.  Developing Charcot changes of the hindfoot.  Calcifications in the distribution of the anterior tibial artery and posterior tibial artery.  IMPRESSION: Changes of osteomyelitis of the right fifth metatarsal with overlying soft tissue wound.  Questionable ongoing osteomyelitis of the right first metatarsal, in a region of prior infection.  Developing Charcot joint of the hindfoot.  Extensive vascular calcifications compatible with infrapopliteal disease. If the patient has not yet been evaluated for claudication/CLI, noninvasive testing with ABI, segmental duplex, and segmental pulse volume recording may be considered as well as office based evaluation.  These results were called by telephone at the time of interpretation on 03/26/2015 at 7:52 am to Dr. Darnelle Catalanama, who verbally acknowledged these results.  Signed,  Yvone NeuJaime S. Loreta AveWagner, DO  [facility]  [facility]   Electronically Signed   By: Gilmer Mor D.O.   On: 03/26/2015 07:52     Medical Consultants:    Dr. Aldean Baker, Orthopedic Surgery  Dr. Cliffton Asters, ID  Dr. Christella Hartigan, Gastroenterology  Anti-Infectives:    Vancomycin 03/26/15--->  Zosyn 03/26/15--->  Subjective:   Hope Pigeon denies significant foot pain.  No N/V.  No dyspnea.  Has poor appetitie  Objective:    Filed Vitals:    04/01/15 0742 04/01/15 0800 04/01/15 0928 04/01/15 1000  BP: 110/65 122/60 122/60 93/56  Pulse: 57  74   Temp: 97.5 F (36.4 C)     TempSrc: Oral     Resp: 14     Height:      Weight:      SpO2: 97%       Intake/Output Summary (Last 24 hours) at 04/01/15 1054 Last data filed at 04/01/15 0744  Gross per 24 hour  Intake   3275 ml  Output   3075 ml  Net    200 ml    Exam: Gen:  NAD, awake/alert Cardiovascular:  RRR, No M/R/G Respiratory:  Lungs CTAB Gastrointestinal:  Abdomen soft, NT/ND, + BS Extremities: See pictures taken 03/27/15 in progress note, currently dressed  Data Reviewed:    Labs: Basic Metabolic Panel:  Recent Labs Lab 03/26/15 1249 03/27/15 0316 03/28/15 0329 03/29/15 0320 03/31/15 0231  NA 149* 149* 152* 144 140  K 3.1* 3.3* 4.0 4.0 3.5  CL 118* 122* 125* 120* 114*  CO2 23 19* 20* 20* 20*  GLUCOSE 119* 93 100* 120* 104*  BUN 96* 83* 60* 41* 26*  CREATININE 2.84* 2.59* 2.36* 2.10* 2.11*  CALCIUM 9.4 9.0 9.2 9.1 9.0   GFR Estimated Creatinine Clearance: 22.7 mL/min (by C-G formula based on Cr of 2.11). Liver Function Tests:  Recent Labs Lab 03/26/15 0054  AST 28  ALT 30  ALKPHOS 126  BILITOT 0.4  PROT 7.5  ALBUMIN 1.7*   Coagulation profile  Recent Labs Lab 03/26/15 0152  INR 1.33    CBC:  Recent Labs Lab 03/26/15 0054  03/26/15 2259 03/27/15 0316 03/28/15 0329 03/29/15 0320 03/31/15 0231  WBC 12.5*  --  13.3* 12.6* 11.8* 9.4 9.1  NEUTROABS 10.4*  --   --   --   --   --   --   HGB 7.2*  < > 8.3* 7.4* 8.7* 8.4* 8.4*  HCT 22.6*  < > 26.0* 22.8* 26.9* 25.4* 26.4*  MCV 78.2  --  82.3 80.0 82.5 82.2 82.8  PLT 278  --  180 175 169 158 122*  < > = values in this interval not displayed. Cardiac Enzymes:  Recent Labs Lab 03/26/15 0258 03/27/15 0815 03/27/15 1412 03/27/15 1920  TROPONINI 0.07* 0.06* 0.07* 0.07*   CBG:  Recent Labs Lab 03/26/15 0126  GLUCAP 140*   Sepsis Labs:  Recent Labs Lab 03/26/15 0258  03/26/15 0532  03/27/15 0316 03/28/15 0329 03/29/15 0320 03/31/15 0231  WBC  --   --   < > 12.6* 11.8* 9.4 9.1  LATICACIDVEN 2.2* 3.0*  --   --   --   --   --   < > = values in this interval not displayed. Microbiology Recent Results (from the past 240 hour(s))  Culture, blood (routine x 2)     Status: None   Collection Time: 03/26/15 12:57 AM  Result Value Ref Range Status   Specimen Description BLOOD LEFT ARM  Final  Special Requests BOTTLES DRAWN AEROBIC ONLY 7CC  Final   Culture   Final    NO GROWTH 5 DAYS Performed at [facility]    Report Status 04/01/2015 FINAL  Final  MRSA PCR Screening     Status: None   Collection Time: 03/26/15  4:05 AM  Result Value Ref Range Status   MRSA by PCR NEGATIVE NEGATIVE Final    Comment:        The GeneXpert MRSA Assay (FDA approved for NASAL specimens only), is one component of a comprehensive MRSA colonization surveillance program. It is not intended to diagnose MRSA infection nor to guide or monitor treatment for MRSA infections.   Culture, blood (routine x 2)     Status: None   Collection Time: 03/26/15  5:32 AM  Result Value Ref Range Status   Specimen Description BLOOD LEFT HAND  Final   Special Requests BOTTLES DRAWN AEROBIC ONLY 5CC  Final   Culture   Final    NO GROWTH 5 DAYS Performed at [facility]    Report Status 04/01/2015 FINAL  Final  Wound culture     Status: None   Collection Time: 03/29/15 12:41 PM  Result Value Ref Range Status   Specimen Description WOUND LEFT FOOT  Final   Special Requests Normal  Final   Gram Stain   Final    FEW WBC PRESENT,BOTH PMN AND MONONUCLEAR NO SQUAMOUS EPITHELIAL CELLS SEEN RARE GRAM POSITIVE COCCI IN PAIRS FR Performed at [facility]    Culture   Final    MULTIPLE ORGANISMS PRESENT, NONE PREDOMINANT Note: NO STAPHYLOCOCCUS AUREUS ISOLATED NO GROUP A STREP (S.PYOGENES) ISOLATED Performed at [facility]    Report Status 04/01/2015  FINAL  Final     Medications:   . acidophilus  2 capsule Oral Daily  . allopurinol  100 mg Oral BID AC  . antiseptic oral rinse  7 mL Mouth Rinse BID  . feeding supplement (ENSURE ENLIVE)  237 mL Oral BID BM  . feeding supplement (ENSURE)  1 Container Oral TID BM  . ferrous sulfate  325 mg Oral Daily  . labetalol  200 mg Oral BID  . pantoprazole  40 mg Oral BID  . piperacillin-tazobactam (ZOSYN)  IV  3.375 g Intravenous 3 times per day  . sodium chloride  1,000 mL Intravenous Once  . vancomycin  750 mg Intravenous Q24H   Continuous Infusions: . dextrose 125 mL/hr at 04/01/15 [id number]    Time spent: 25 minutes.   LOS: 6 days   [facility] LLCELGERGAWY, Jarita Raval  MD [facility] Pager [phone].   *Please refer to [email or web address], password [email or web address] to get updated schedule on who will round on this patient, as hospitalists switch teams weekly. If 7PM-7AM, please contact night-coverage at [email or web address], password [email or web address] for any overnight needs.  04/01/2015, 10:54 AM

## 2015-04-01 NOTE — Progress Notes (Signed)
Peripherally Inserted Central Catheter/Midline Placement  The IV Nurse has discussed with the patient and/or persons authorized to consent for the patient, the purpose of this procedure and the potential benefits and risks involved with this procedure.  The benefits include less needle sticks, lab draws from the catheter and patient may be discharged home with the catheter.  Risks include, but not limited to, infection, bleeding, blood clot (thrombus formation), and puncture of an artery; nerve damage and irregular heat beat.  Alternatives to this procedure were also discussed.  PICC/Midline Placement Documentation     Information given to wife  Vincent PumaCarol Miranda   Vincent Miranda Vincent Miranda 04/01/2015, 4:28 PM

## 2015-04-01 NOTE — Progress Notes (Signed)
[facility]  [facility]  [facility] is providing the following services: HHRN and Home Infusion Pharmacy team for home IF ABX. [facility] will provide in hospital teaching re: IV ABX set up and administration to support independence at home.  [facility] team will follow Mr Vincent Miranda and support [facility] team for transition home when ordered.   If patient discharges after hours, please call [phone].   [name] 04/01/2015, 11:13 AM

## 2015-04-01 NOTE — Progress Notes (Signed)
INFECTIOUS DISEASE PROGRESS NOTE  ID: Vincent Miranda is a 81 y.o. male with  Principal Problem:   Atherosclerotic peripheral vascular disease with gangrene Active Problems:   AKI (acute kidney injury)   Protein-calorie malnutrition, severe   Hypertension   CKD (chronic kidney disease), stage III   Anemia of chronic disease   Dehydration with hypernatremia   Uremia   Acute on Chronic blood loss anemia   Occult blood positive stool   Osteomyelitis of left foot   Gangrene of foot   Severe sepsis with acute organ dysfunction   Elevated troponin   Acute blood loss anemia   Gangrene  Subjective: Without complaints. Is thinking about surgery.   Abtx:  Anti-infectives    Start     Dose/Rate Route Frequency Ordered Stop   03/31/15 0530  vancomycin (VANCOCIN) IVPB 750 mg/150 ml premix     750 mg 150 mL/hr over 60 Minutes Intravenous Every 24 hours 03/30/15 0822     03/29/15 1400  piperacillin-tazobactam (ZOSYN) IVPB 3.375 g     3.375 g 12.5 mL/hr over 240 Minutes Intravenous 3 times per day 03/29/15 0818     03/26/15 0400  vancomycin (VANCOCIN) IVPB 1000 mg/200 mL premix  Status:  Discontinued     1,000 mg 200 mL/hr over 60 Minutes Intravenous Every 48 hours 03/26/15 0334 03/30/15 0822   03/26/15 0400  piperacillin-tazobactam (ZOSYN) IVPB 2.25 g  Status:  Discontinued     2.25 g 100 mL/hr over 30 Minutes Intravenous Every 6 hours 03/26/15 0334 03/29/15 0818      Medications:  Scheduled: . acidophilus  2 capsule Oral Daily  . allopurinol  100 mg Oral BID AC  . antiseptic oral rinse  7 mL Mouth Rinse BID  . feeding supplement (ENSURE ENLIVE)  237 mL Oral BID BM  . feeding supplement (ENSURE)  1 Container Oral TID BM  . ferrous sulfate  325 mg Oral Daily  . labetalol  200 mg Oral BID  . pantoprazole  40 mg Oral BID  . piperacillin-tazobactam (ZOSYN)  IV  3.375 g Intravenous 3 times per day  . sodium chloride  1,000 mL Intravenous Once  . vancomycin  750 mg  Intravenous Q24H    Objective: Vital signs in last 24 hours: Temp:  [97.4 F (36.3 C)-97.9 F (36.6 C)] 97.5 F (36.4 C) (05/12 0742) Pulse Rate:  [55-74] 74 (05/12 0928) Resp:  [14-23] 14 (05/12 0742) BP: (91-132)/(44-74) 122/60 mmHg (05/12 0928) SpO2:  [95 %-100 %] 97 % (05/12 0742)   General appearance: alert, cooperative and no distress Extremities: feet are wrapped, odor mildly less.   Lab Results  Recent Labs  03/31/15 0231  WBC 9.1  HGB 8.4*  HCT 26.4*  NA 140  K 3.5  CL 114*  CO2 20*  BUN 26*  CREATININE 2.11*   Liver Panel No results for input(s): PROT, ALBUMIN, AST, ALT, ALKPHOS, BILITOT, BILIDIR, IBILI in the last 72 hours. Sedimentation Rate No results for input(s): ESRSEDRATE in the last 72 hours. C-Reactive Protein No results for input(s): CRP in the last 72 hours.  Microbiology: Recent Results (from the past 240 hour(s))  Culture, blood (routine x 2)     Status: None   Collection Time: 03/26/15 12:57 AM  Result Value Ref Range Status   Specimen Description BLOOD LEFT ARM  Final   Special Requests BOTTLES DRAWN AEROBIC ONLY 7CC  Final   Culture   Final    NO GROWTH 5 DAYS Performed  at [facility]    Report Status 04/01/2015 FINAL  Final  MRSA PCR Screening     Status: None   Collection Time: 03/26/15  4:05 AM  Result Value Ref Range Status   MRSA by PCR NEGATIVE NEGATIVE Final    Comment:        The GeneXpert MRSA Assay (FDA approved for NASAL specimens only), is one component of a comprehensive MRSA colonization surveillance program. It is not intended to diagnose MRSA infection nor to guide or monitor treatment for MRSA infections.   Culture, blood (routine x 2)     Status: None   Collection Time: 03/26/15  5:32 AM  Result Value Ref Range Status   Specimen Description BLOOD LEFT HAND  Final   Special Requests BOTTLES DRAWN AEROBIC ONLY 5CC  Final   Culture   Final    NO GROWTH 5 DAYS Performed at [facility]     Report Status 04/01/2015 FINAL  Final  Wound culture     Status: None   Collection Time: 03/29/15 12:41 PM  Result Value Ref Range Status   Specimen Description WOUND LEFT FOOT  Final   Special Requests Normal  Final   Gram Stain   Final    FEW WBC PRESENT,BOTH PMN AND MONONUCLEAR NO SQUAMOUS EPITHELIAL CELLS SEEN RARE GRAM POSITIVE COCCI IN PAIRS FR Performed at [facility]    Culture   Final    MULTIPLE ORGANISMS PRESENT, NONE PREDOMINANT Note: NO STAPHYLOCOCCUS AUREUS ISOLATED NO GROUP A STREP (S.PYOGENES) ISOLATED Performed at [facility]    Report Status 04/01/2015 FINAL  Final    Studies/Results: No results found.   Assessment/Plan: Sepsis Osteomyelitis, gangrene of BLE (pt refusing surgery) PVD CRI Severe protein calorie malnutrition + Troponin  Total days of antibiotics: 7 vanco/zosyn        Would plan for him to go home with invanz and vanco for 6 weeks. If he refuses PIC, would give him po augmentin for at least 4 months He should f/u in wound care center Needs nutrition.  Available if questions   Johny SaxJeffrey Hatcher Infectious Diseases (pager) [phone] [email or web address] 04/01/2015, 10:02 AM  LOS: 6 days

## 2015-04-01 NOTE — Care Management Note (Signed)
Case Management Note  Patient Details  Name: Vincent PigeonRobert T Straus MRN: [id number] Date of Birth: 1925-06-18  Subjective/Objective:                    Action/Plan:   Expected Discharge Date:                  Expected Discharge Plan:  Home w Home Health Services  In-House Referral:     Discharge planning Services  CM Consult  Post Acute Care Choice:  Home Health, Durable Medical Equipment Choice offered to:  Spouse  DME Arranged:  IV pump/equipment DME Agency:  [facility]  [facility]  [facility]  Status of Service:  Completed, signed off  Medicare Important Message Given:  Yes Date Medicare IM Given:  03/31/15 Medicare IM give by:  Verdis PrimeHenrietta Mayo RN Date Additional Medicare IM Given:    Additional Medicare Important Message give by:     If discussed at Long Length of Stay Meetings, dates discussed:    Additional Comments: wife [name] called from work [phone] and chose [facility]. Ref to [facility]na w [facility] for home iv antibiotics.  [name], [name], RN 04/01/2015, 10:42 AM

## 2015-04-01 NOTE — Care Management Note (Signed)
Case Management Note  Patient Details  Name: Vincent PigeonRobert T Miranda MRN: [id number] Date of Birth: Dec 21, 1924  Subjective/Objective:                    Action/Plan:   Expected Discharge Date:                  Expected Discharge Plan:  Home w Home Health Services  In-House Referral:     Discharge planning Services  CM Consult  Post Acute Care Choice:  Home Health, Durable Medical Equipment Choice offered to:  Spouse  DME Arranged:  IV pump/equipment DME Agency:  [facility]  [facility]  [facility]  Status of Service:  Completed, signed off  Medicare Important Message Given:  Yes Date Medicare IM Given:  03/31/15 Medicare IM give by:  Verdis PrimeHenrietta Mayo RN Date Additional Medicare IM Given:    Additional Medicare Important Message give by:     If discussed at Long Length of Stay Meetings, dates discussed:    Additional Comments: [place] wound care appt on 5-19 at 1:30 pm.  [name], Abed Schar T, RN 04/01/2015, 1:32 PM

## 2015-04-02 MED ORDER — SODIUM CHLORIDE 0.9 % IV SOLN: 500.0000 mg | INTRAVENOUS | Status: AC

## 2015-04-02 MED ORDER — RISAQUAD PO CAPS: 2.0000 | ORAL_CAPSULE | Freq: Every day | ORAL | Status: DC

## 2015-04-02 MED ORDER — VANCOMYCIN HCL IN DEXTROSE 750-5 MG/150ML-% IV SOLN: 750.0000 mg | INTRAVENOUS | Status: DC

## 2015-04-02 MED ORDER — VANCOMYCIN HCL IN DEXTROSE 750-5 MG/150ML-% IV SOLN: 750.0000 mg | INTRAVENOUS | Status: AC

## 2015-04-02 MED ORDER — ENSURE PUDDING PO PUDG: 1.0000 | Freq: Three times a day (TID) | ORAL | Status: DC

## 2015-04-02 MED ORDER — SODIUM CHLORIDE 0.9 % IV SOLN: 500.0000 mg | INTRAVENOUS | Status: DC

## 2015-04-02 MED ORDER — ENSURE ENLIVE PO LIQD: 237.0000 mL | Freq: Two times a day (BID) | ORAL | Status: DC

## 2015-04-02 NOTE — Discharge Instructions (Signed)
Follow with Primary MD Burtis JunesBLOUNT,ALVIN VINCENT, MD in 7 days   Get CBC, CMP, 2 view Chest X ray checked  by Primary MD next visit.    Activity: As tolerated with Full fall precautions use walker/cane & assistance as needed   Disposition Home    Diet: Heart Healthy , carbohydrate modified , with feeding assistance and aspiration precautions. Encourage fluid intake.  For Heart failure patients - Check your Weight same time everyday, if you gain over 2 pounds, or you develop in leg swelling, experience more shortness of breath or chest pain, call your Primary MD immediately. Follow Cardiac Low Salt Diet and 1.5 lit/day fluid restriction.   On your next visit with your primary care physician please Get Medicines reviewed and adjusted.   Please request your Prim.MD to go over all Hospital Tests and Procedure/Radiological results at the follow up, please get all Hospital records sent to your Prim MD by signing hospital release before you go home.   If you experience worsening of your admission symptoms, develop shortness of breath, life threatening emergency, suicidal or homicidal thoughts you must seek medical attention immediately by calling 911 or calling your MD immediately  if symptoms less severe.  You Must read complete instructions/literature along with all the possible adverse reactions/side effects for all the Medicines you take and that have been prescribed to you. Take any new Medicines after you have completely understood and accpet all the possible adverse reactions/side effects.   Do not drive, operating heavy machinery, perform activities at heights, swimming or participation in water activities or provide baby sitting services if your were admitted for syncope or siezures until you have seen by Primary MD or a Neurologist and advised to do so again.  Do not drive when taking Pain medications.    Do not take more than prescribed Pain, Sleep and Anxiety Medications  Special  Instructions: If you have smoked or chewed Tobacco  in the last 2 yrs please stop smoking, stop any regular Alcohol  and or any Recreational drug use.  Wear Seat belts while driving.   Please note  You were cared for by a hospitalist during your hospital stay. If you have any questions about your discharge medications or the care you received while you were in the hospital after you are discharged, you can call the unit and asked to speak with the hospitalist on call if the hospitalist that took care of you is not available. Once you are discharged, your primary care physician will handle any further medical issues. Please note that NO REFILLS for any discharge medications will be authorized once you are discharged, as it is imperative that you return to your primary care physician (or establish a relationship with a primary care physician if you do not have one) for your aftercare needs so that they can reassess your need for medications and monitor your lab values.

## 2015-04-02 NOTE — Discharge Summary (Signed)
Vincent Miranda, is a 81 y.o. male  DOB Nov 13, 1925  MRN [id number].  Admission date:  03/26/2015  Admitting Physician  Hillary Bow, DO  Discharge Date:  04/02/2015   Primary MD  Burtis Junes, MD  Recommendations for primary care physician for things to follow:  - Please check CBC, BMP during next visit. - Patient with osteomyelitis , with significant wound, family refused surgery, conservative management with IV antibiotics, enters for signs of sepsis.   Admission Diagnosis  Gangrene [I96]   Discharge Diagnosis  Gangrene [I96]    Principal Problem:   Atherosclerotic peripheral vascular disease with gangrene Active Problems:   AKI (acute kidney injury)   Protein-calorie malnutrition, severe   Hypertension   CKD (chronic kidney disease), stage III   Anemia of chronic disease   Dehydration with hypernatremia   Uremia   Acute on Chronic blood loss anemia   Occult blood positive stool   Osteomyelitis of left foot   Gangrene of foot   Severe sepsis with acute organ dysfunction   Elevated troponin   Acute blood loss anemia   Gangrene      Past Medical History  Diagnosis Date  . Hypertension   . Chronic kidney disease   . Anemia of chronic disease   . Gout   . Acute blood loss anemia 10/18/2013    Past Surgical History  Procedure Laterality Date  . Eye surgery    . Amputation Left 10/19/2013    Procedure: AMPUTATION RAY  LEFT GREAT TOE;  Surgeon: Nadara Mustard, MD;  Location: [facility] OR;  Service: Orthopedics;  Laterality: Left;       History of present illness and  Hospital Course:     Kindly see H&P for history of present illness and admission details, please review complete Labs, Consult reports and Test reports for all details in brief  HPI  from the history and physical done on the day of admission  Vincent Miranda is a 81 y.o. male with ongoing trouble with  gangrene of toes of both feet for the past couple of months. Currently on doxycycline as outpatient. Patient presents to the ED with generalized weakness, fatigue, decreased PO intake, especially over the past 2-3 days.  Patient has a history of amputation of the great toe of the LEFT foot, and the area at the base of the stump became infected, gangrenous, and now has visible bone in the wound per wife as well as purulent drainage. Last 2 toes on his RIGHT foot are also gangrenous and she (correctly) states "will likely require amputation" per his wife. This has been ongoing for the past couple of months and wound care has been done by his wife and his PCP, however he developed the systemic symptoms noted above these past couple of days.  No melena, no blood in stool, no urinary retention. Patient is normally up and active, its only today that he has been fatigued and altered.  Hospital Course  Vincent Miranda is an 81 y.o.  male with a PMH of bilateral foot gangrene, prior history of amputation of the great toe on the left which has become infected again and now has exposed bone with purulent drainage as well as 2 toes on his right foot that are frankly gangrenous, treated with doxycycline as an outpatient, was admitted 03/26/15 with chief complaint of generalized fatigue. Orthopedic surgery has been consulted with plans for transtibial amputations 03/28/15. Patient also has GI bleeding and has required 2 units of blood during hospital stay. Patient's wife has indicated that she does not want her husband to have transtibial amputations and despite multiple attempts to educate her, continues to insist that he can heal his wounds since he has done so in the past. ID consulted for assistance with medical management in the face of surgical refusal, wound culture was polymicrobial, so recommendation was for IV vancomycin and Invanz for total of 6 weeks - Discussed with wife about skilled nursing facility  discharge, but she declined, and requested discharge home with home care.  Severe sepsis with acute organ dysfunction secondary to bilateral foot gangrene/osteomyelitis of left foot - empiric antibiotics with vancomycin and Zosyn initially. -  final blood cultures, which are negative to date. - Seen by Dr. Lajoyce Cornersuda 03/26/15, who recommended transtibial amputations however patient and wife would not consent. - ABIs performed showing mild reduction in arterial flow on the right and moderate reduction in arterial flow on the left. - Initially required fluid volume resuscitation for hypotension. Remains hemodynamically stable. - ID evaluated patient  for recommendations regarding antibiotic therapy/duration. Treated total of 7 days of IV vancomycin and Zosyn, wound cultures is a polymicrobial, they recommend total of 6 weeks of IV Vanco and Invanz. - Wound care consult appreciated, continue with wound care on discharge, will follow with wound center as an outpatient.  Hypertension - Actually blood pressure on the lower side initially, plans acceptable, will need for antihypertensive medication.   Severe protein calorie malnutrition - Nutritional supplements prescribed   Troponin I elevation - Likely demand ischemia. - Troponins mildly elevated , troponins trend 0.06>> 0.07>> 0.07 - Hold aspirin given GI bleeding.  - Denies any chest pain or shortness of breath    Hypokalemia - Resolved with supplementation.   AKI (acute kidney injury) in the setting of stage III chronic kidney disease / uremia - Baseline creatinine 1.7-1.9. Creatinine 2.98 ---> 2.59---> 2.36 ---> 2.10 with IV fluids. - Appears to be prerenal, Creatinine almost back to baseline values.   Acute on chronic blood loss anemia/occult blood positive stool with history of anemia of chronic disease - Baseline hemoglobin appears to be around 8.3-8.6. Admission hemoglobin lower than usual baseline values. - Stools heme  positive. - Status post a total of 3 units of PRBCs. Hemoglobin stable post transfusion. - Continue oral iron therapy. - Holding aspirin. - Evaluated by GI with no current recommendations for further evaluation.   Dehydration with hypernatremia - Resolved with hypotonic IV fluids.     Discharge Condition: Stable, but patient at continuous risk of recurrence of sepsis given his osteomyelitis, patient and wife refusing surgical intervention.   Follow UP  Follow-up Information    Follow up with [facility] On 04/08/2015.   Why:  appt time 1:30pm   Contact information:   [phone]      Follow up with Burtis JunesBLOUNT,ALVIN VINCENT, MD. Schedule an appointment as soon as possible for a visit in 1 week.   Specialty:  Family Medicine   Why:  Posthospitalization  follow-up   Contact information:   [address] [address] [place] [address] [phone]         Discharge Instructions  and  Discharge Medications     Discharge Instructions    Diet - low sodium heart healthy    Complete by:  As directed      Discharge instructions    Complete by:  As directed   Follow with Primary MD Burtis Junes, MD in 7 days   Get CBC, CMP, 2 view Chest X ray checked  by Primary MD next visit.    Activity: As tolerated with Full fall precautions use walker/cane & assistance as needed   Disposition Home    Diet: Heart Healthy , carbohydrate modified , with feeding assistance and aspiration precautions. Encourage fluid intake.  For Heart failure patients - Check your Weight same time everyday, if you gain over 2 pounds, or you develop in leg swelling, experience more shortness of breath or chest pain, call your Primary MD immediately. Follow Cardiac Low Salt Diet and 1.5 lit/day fluid restriction.   On your next visit with your primary care physician please Get Medicines reviewed and adjusted.   Please request your Prim.MD to go over all Hospital Tests and  Procedure/Radiological results at the follow up, please get all Hospital records sent to your Prim MD by signing hospital release before you go home.   If you experience worsening of your admission symptoms, develop shortness of breath, life threatening emergency, suicidal or homicidal thoughts you must seek medical attention immediately by calling 911 or calling your MD immediately  if symptoms less severe.  You Must read complete instructions/literature along with all the possible adverse reactions/side effects for all the Medicines you take and that have been prescribed to you. Take any new Medicines after you have completely understood and accpet all the possible adverse reactions/side effects.   Do not drive, operating heavy machinery, perform activities at heights, swimming or participation in water activities or provide baby sitting services if your were admitted for syncope or siezures until you have seen by Primary MD or a Neurologist and advised to do so again.  Do not drive when taking Pain medications.    Do not take more than prescribed Pain, Sleep and Anxiety Medications  Special Instructions: If you have smoked or chewed Tobacco  in the last 2 yrs please stop smoking, stop any regular Alcohol  and or any Recreational drug use.  Wear Seat belts while driving.   Please note  You were cared for by a hospitalist during your hospital stay. If you have any questions about your discharge medications or the care you received while you were in the hospital after you are discharged, you can call the unit and asked to speak with the hospitalist on call if the hospitalist that took care of you is not available. Once you are discharged, your primary care physician will handle any further medical issues. Please note that NO REFILLS for any discharge medications will be authorized once you are discharged, as it is imperative that you return to your primary care physician (or establish a  relationship with a primary care physician if you do not have one) for your aftercare needs so that they can reassess your need for medications and monitor your lab values.     Discharge wound care:    Complete by:  As directed   Apply Vaseline gauze to open wounds on bilat feet Q day, and  apply moist fluffed gauze to left anterior foot wound Q day, then cover bilat feet with ABD pads and kerlex  Left lower calf 7X3X.2cm, 100% red and moist, small amt yellow drainage. Left plantar foot 13X5X1cm; deepest area is near toe. Wound 80% red, 20% yellow slough, mod amt foul smelling odor and yellow drainage. Anterior foot fluctuant, and toe is loosely attached dry eschar, 4X2cm with strong foul odor. Wound bed: Right inner foot 10X7X.2cm, 20% loose yellow slough, 80% red, mod amt yellow drainage, strong foul odor. Right outer foot 4.5X2X.2cm, 90% red and moist, 10% yellow slough, small amt yellow drainage, no odor. Right anterior foot 5X3X.2cm,90% red and moist, 10% yellow slough, small amt yellow drainage, no odor. Right anterior foot 6X3X.2cm,90% red and moist, 10% yellow slough, small amt yellow drainage, no odor. Right small toe eschar Dressing procedure/placement/frequency: Dressing change orders for nurses Q day, and for family members after discharge     Increase activity slowly    Complete by:  As directed             Medication List    STOP taking these medications        aspirin 81 MG tablet     doxycycline 100 MG tablet  Commonly known as:  VIBRA-TABS     furosemide 40 MG tablet  Commonly known as:  LASIX     labetalol 100 MG tablet  Commonly known as:  NORMODYNE     POTASSIUM PO      TAKE these medications        acidophilus Caps capsule  Take 2 capsules by mouth daily.     allopurinol 100 MG tablet  Commonly known as:  ZYLOPRIM  Take 100 mg by mouth 2 (two) times daily before a meal.     ertapenem 0.5 g in sodium chloride 0.9 % 50 mL  Inject 0.5 g into the vein  daily. This is to be monitored and adjusted by home care service pharmacy according to his renal function. Finish total of 6 weeks stop date 622/16     feeding supplement (ENSURE ENLIVE) Liqd  Take 237 mLs by mouth 2 (two) times daily between meals.     feeding supplement (ENSURE) Pudg  Take 1 Container by mouth 3 (three) times daily between meals.     IRON PO  Take 1 tablet by mouth daily.     Vancomycin 750 MG/150ML Soln  Commonly known as:  VANCOCIN  Inject 150 mLs (750 mg total) into the vein daily. Vancomycin to be monitored and adjusted, vancomycin peak and trough level to be monitored by home care pharmacy, and level adjusted accordingly, and BMP need to be followed on a regular basis initially once every 3 days X4, then as per pharmacy protocol.          Diet and Activity recommendation: See Discharge Instructions above   Consults obtained -    Dr. Aldean Baker, Orthopedic Surgery  Dr. Cliffton Asters, ID  Dr. Christella Hartigan, Gastroenterology Major procedures and Radiology Reports - PLEASE review detailed and final reports for all details, in brief -      Ct Head Wo Contrast  03/26/2015   CLINICAL DATA:  Weakness and generalized fatigue.  EXAM: CT HEAD WITHOUT CONTRAST  TECHNIQUE: Contiguous axial images were obtained from the base of the skull through the vertex without intravenous contrast.  COMPARISON:  None.  FINDINGS: There is no intracranial hemorrhage, mass or evidence of acute infarction. There is moderate generalized atrophy. There  is moderate chronic microvascular ischemic change. There is no significant extra-axial fluid collection.  No acute intracranial findings are evident. The visible portions of the paranasal sinuses are clear.  IMPRESSION: Moderate generalized atrophy and chronic microvascular changes. No acute findings.   Electronically Signed   By: Ellery Plunkaniel R Mitchell M.D.   On: 03/26/2015 01:31   Dg Chest Port 1 View  03/26/2015   CLINICAL DATA:  Dyspnea, weakness   EXAM: PORTABLE CHEST - 1 VIEW  COMPARISON:  08/05/2013  FINDINGS: There is marked aortic tortuosity which appears unchanged. Heart size is normal and unchanged. The lungs are clear. The pulmonary vasculature is normal. No pneumothorax or large effusion is evident.  IMPRESSION: No acute cardiopulmonary findings.   Electronically Signed   By: Ellery Plunkaniel R Mitchell M.D.   On: 03/26/2015 01:13   Dg Foot 2 Views Left  03/26/2015   CLINICAL DATA:  Gangrene  EXAM: LEFT FOOT - 2 VIEW  COMPARISON:  October 18, 2013  FINDINGS: Frontal and lateral views were obtained. The patient has had amputation at the level of the medial cuneiform -first metatarsal junction. There is extensive soft tissue air in the medial aspect of the foot distally in the area of amputation. There is dislocation at the second MTP joint site with the second proximal phalanx displaced laterally and proximal to the distal second metatarsal. There is also subluxation at the second PIP joint. No acute fracture seen. Other joint spaces appear intact. There is no overt bony destruction or erosion. There is soft tissue swelling dorsally with spurring in the dorsal midfoot. There is a small posterior calcaneal spur.  IMPRESSION: Soft tissue air medially and distally, consistent with infection. Amputation at the level of the medial cuneiform- first MTP joint without bony destruction in this area. Dislocation at the second MTP joint with remodeling of the second proximal phalanx. Subluxation at the second PIP joint. No overt bony destruction seen currently. Soft tissue swelling is noted somewhat diffusely. Areas of osteoarthritic change as described.   Electronically Signed   By: Bretta BangWilliam  Woodruff III M.D.   On: 03/26/2015 07:43   Dg Foot 2 Views Right  03/26/2015   CLINICAL DATA:  81 year old male with a history of osteomyelitis and gangrene.  EXAM: RIGHT FOOT - 2 VIEW  COMPARISON:  None.  FINDINGS: No acute fracture line identified.  Rarefaction of the distal  right fifth metatarsal with thinning of the overlying soft tissues.  Decreased bone density at the head of the right first metatarsal, in a region of prior osteomyelitis.  Diffuse osteopenia.  Developing Charcot changes of the hindfoot.  Calcifications in the distribution of the anterior tibial artery and posterior tibial artery.  IMPRESSION: Changes of osteomyelitis of the right fifth metatarsal with overlying soft tissue wound.  Questionable ongoing osteomyelitis of the right first metatarsal, in a region of prior infection.  Developing Charcot joint of the hindfoot.  Extensive vascular calcifications compatible with infrapopliteal disease. If the patient has not yet been evaluated for claudication/CLI, noninvasive testing with ABI, segmental duplex, and segmental pulse volume recording may be considered as well as office based evaluation.  These results were called by telephone at the time of interpretation on 03/26/2015 at 7:52 am to Dr. Darnelle Catalanama, who verbally acknowledged these results.  Signed,  Yvone NeuJaime S. Loreta AveWagner, DO  [facility]  [facility]   Electronically Signed   By: Gilmer MorJaime  Wagner D.O.   On: 03/26/2015 07:52    Micro Results  Recent Results (from the past 240 hour(s))  Culture, blood (routine x 2)     Status: None   Collection Time: 03/26/15 12:57 AM  Result Value Ref Range Status   Specimen Description BLOOD LEFT ARM  Final   Special Requests BOTTLES DRAWN AEROBIC ONLY 7CC  Final   Culture   Final    NO GROWTH 5 DAYS Performed at [facility]    Report Status 04/01/2015 FINAL  Final  MRSA PCR Screening     Status: None   Collection Time: 03/26/15  4:05 AM  Result Value Ref Range Status   MRSA by PCR NEGATIVE NEGATIVE Final    Comment:        The GeneXpert MRSA Assay (FDA approved for NASAL specimens only), is one component of a comprehensive MRSA colonization surveillance program. It is not intended to diagnose MRSA infection  nor to guide or monitor treatment for MRSA infections.   Culture, blood (routine x 2)     Status: None   Collection Time: 03/26/15  5:32 AM  Result Value Ref Range Status   Specimen Description BLOOD LEFT HAND  Final   Special Requests BOTTLES DRAWN AEROBIC ONLY 5CC  Final   Culture   Final    NO GROWTH 5 DAYS Performed at [facility]    Report Status 04/01/2015 FINAL  Final  Wound culture     Status: None   Collection Time: 03/29/15 12:41 PM  Result Value Ref Range Status   Specimen Description WOUND LEFT FOOT  Final   Special Requests Normal  Final   Gram Stain   Final    FEW WBC PRESENT,BOTH PMN AND MONONUCLEAR NO SQUAMOUS EPITHELIAL CELLS SEEN RARE GRAM POSITIVE COCCI IN PAIRS FR Performed at [facility]    Culture   Final    MULTIPLE ORGANISMS PRESENT, NONE PREDOMINANT Note: NO STAPHYLOCOCCUS AUREUS ISOLATED NO GROUP A STREP (S.PYOGENES) ISOLATED Performed at [facility]    Report Status 04/01/2015 FINAL  Final       Today   Subjective:   Vincent Miranda today has no headache,no chest abdominal pain,no new weakness tingling or numbness, feels much better today.  Objective:   Blood pressure 148/75, pulse 75, temperature 97.7 F (36.5 C), temperature source Oral, resp. rate 15, height 5\' 11"  (1.803 m), weight 66.4 kg (146 lb 6.2 oz), SpO2 97 %.   Intake/Output Summary (Last 24 hours) at 04/02/15 1144 Last data filed at 04/02/15 0454  Gross per 24 hour  Intake 1804.5 ml  Output   1000 ml  Net  804.5 ml    Exam  Gen: NAD, awake/alert, frail, malnourished Cardiovascular: RRR, No M/R/G Respiratory: Lungs CTAB Gastrointestinal: Abdomen soft, NT/ND, + BS Extremities:  currently dressed Data Review   CBC w Diff: Lab Results  Component Value Date   WBC 9.1 03/31/2015   HGB 8.4* 03/31/2015   HCT 26.4* 03/31/2015   PLT 122* 03/31/2015   LYMPHOPCT 7* 03/26/2015   MONOPCT 10 03/26/2015   EOSPCT 0 03/26/2015   BASOPCT 0  03/26/2015    CMP: Lab Results  Component Value Date   NA 140 03/31/2015   K 3.5 03/31/2015   CL 114* 03/31/2015   CO2 20* 03/31/2015   BUN 26* 03/31/2015   CREATININE 2.11* 03/31/2015   PROT 7.5 03/26/2015   ALBUMIN 1.7* 03/26/2015   BILITOT 0.4 03/26/2015   ALKPHOS 126 03/26/2015   AST 28 03/26/2015   ALT 30 03/26/2015  .  Total Time in preparing paper work, data evaluation and todays exam - 35 minutes  Jadee Golebiewski M.D on 04/02/2015 at 11:44 AM  [facility]   Office  [phone]

## 2015-04-02 NOTE — Evaluation (Signed)
Physical Therapy Evaluation Patient Details Name: Vincent Miranda MRN: [id number] DOB: July 20, 1925 Today's Date: 04/02/2015   History of Present Illness  Vincent Miranda is an 81 y.o. male with a PMH of bilateral foot gangrene, prior history of amputation of the great toe on the left which has become infected again and now has exposed bone with purulent drainage as well as 2 toes on his right foot that are frankly gangrenous, treated with doxycycline as an outpatient, was admitted 03/26/15 with chief complaint of generalized fatigue. Orthopedic surgery has been consulted with plans for transtibial amputations 03/28/15. Patient also has GI bleeding and has required 2 units of blood during hospital stay. Patient's wife has indicated that she does not want her husband to have transtibial amputations and despite multiple attempts to educate her, continues to insist that he can heal his wounds since he has done so in the past. ID consulted for assistance with medical management in the face of surgical refusal, wound culture was polymicrobial, so recommendation was for IV vancomycin and Invanz for total of 6 weeks  Clinical Impression  Pt admitted with above diagnosis. Pt currently with functional limitations due to the deficits listed below (see PT Problem List). Pt very weak from a week of immobility.  Pt was able to stand with RW however unsteady and definitely weaker per wife than when he came in.  Wife adamant that she will be fine at home with pt and HH f/u.  Discussed that gait belt might be beneficial and even had wife use the gait belt and assist with transfers however wife refused, saying she didn't need it.  Wife states she will make sure pt has 24 hour care therefore recommend home with maximal HH f/u.  Pt most likely d/cing today but will set goals in case he doesn't for some reason.  Pt will benefit from skilled PT to increase their independence and safety with mobility to allow discharge to the  venue listed below.      Follow Up Recommendations Home health PT;Supervision/Assistance - 24 hour (HHOT, HHRN)    Equipment Recommendations  Other (comment) (gait belt)    Recommendations for Other Services       Precautions / Restrictions Precautions Precautions: Fall Restrictions Weight Bearing Restrictions: No      Mobility  Bed Mobility Overal bed mobility: Needs Assistance Bed Mobility: Supine to Sit     Supine to sit: Min assist     General bed mobility comments: min assist with rail, assist and cues to initiate LE movement  Transfers Overall transfer level: Needs assistance Equipment used: Rolling walker (2 wheeled) Transfers: Sit to/from [employer] CorporationStand;Stand Pivot Transfers Sit to Stand: Min assist;From elevated surface Stand pivot transfers: Min assist       General transfer comment: Pt needed cues for hand placement.  Needed assist for guarding pt in standing as well as pt had BM and had to stand to be cleaned.  Total assist to clean pt.  Pt kept going therefore obtained 3N1 and assisted pt to the 3N1.  Once pt finished on 3N1, had wife assist pt to standing position.  Pt needed min assist and wife was able to assist him to standing.  PT cleaned his bottom.  Discussed with wife regarding that this was taking two people and wife said they would have that at home.  Pt needed min assist when taking four steps from 3N1 to bed (he refused to walk) as he lost balance stepping around to bed  and needed encouragement and cues to stand upright until close enough to bed.  Had to use gait belt to assist pt to proper position prior to sitting down and wife states she will have no difficulty with this.  Wife understands that PT recommends use of RW at all times on d/c.  Wife refuses the need for gait belt even though PT explained why she shouldn't pull up on pts arms.    Ambulation/Gait Ambulation/Gait assistance: Min assist Ambulation Distance (Feet): 3 Feet Assistive device: Rolling  walker (2 wheeled) Gait Pattern/deviations: Step-to pattern;Decreased stride length;Decreased step length - right;Decreased step length - left;Shuffle;Trunk flexed;Leaning posteriorly   Gait velocity interpretation: Below normal speed for age/gender General Gait Details: Pt with LOB stepping back around to bed needing min assist to recover balance.  Wife states she can assist pt with this at home.   Stairs            Wheelchair Mobility    Modified Rankin (Stroke Patients Only)       Balance Overall balance assessment: Needs assistance Sitting-balance support: No upper extremity supported;Feet supported Sitting balance-Leahy Scale: Fair   Postural control: Posterior lean Standing balance support: Bilateral upper extremity supported;During functional activity Standing balance-Leahy Scale: Poor Standing balance comment: Pt requires RW to stand for UE support and needed cues and assist for postural stability.  Unsteady at times.                              Pertinent Vitals/Pain Pain Assessment: No/denies pain  VSS    Home Living Family/patient expects to be discharged to:: Private residence Living Arrangements: Spouse/significant other Available Help at Discharge: Family;Available 24 hours/day Type of Home: House Home Access: Stairs to enter Entrance Stairs-Rails: None Entrance Stairs-Number of Steps: 1 Home Layout: One level Home Equipment: Walker - 2 wheels;Crutches;Bedside commode;Tub bench      Prior Function Level of Independence: Independent;Needs assistance      ADL's / Homemaking Assistance Needed: Wife assisted with ADL's prn  Comments: Per wife, had not used device in over a year.       Hand Dominance   Dominant Hand: Left    Extremity/Trunk Assessment   Upper Extremity Assessment: Defer to OT evaluation           Lower Extremity Assessment: Generalized weakness      Cervical / Trunk Assessment: Kyphotic  Communication    Communication: No difficulties  Cognition Arousal/Alertness: Awake/alert Behavior During Therapy: WFL for tasks assessed/performed Overall Cognitive Status: Within Functional Limits for tasks assessed                      General Comments      Exercises        Assessment/Plan    PT Assessment Patient needs continued PT services  PT Diagnosis Generalized weakness   PT Problem List Decreased activity tolerance;Decreased balance;Decreased mobility;Decreased strength;Decreased knowledge of use of DME;Decreased safety awareness;Decreased knowledge of precautions  PT Treatment Interventions DME instruction;Gait training;Functional mobility training;Therapeutic activities;Therapeutic exercise;Balance training;Patient/family education;Stair training   PT Goals (Current goals can be found in the Care Plan section) Acute Rehab PT Goals Patient Stated Goal: to go home  PT Goal Formulation: With patient/family Time For Goal Achievement: 04/09/15 Potential to Achieve Goals: Good    Frequency Min 3X/week   Barriers to discharge        Co-evaluation  End of Session Equipment Utilized During Treatment: Gait belt Activity Tolerance: Patient limited by fatigue Patient left: in bed;with call bell/phone within reach;with family/visitor present Nurse Communication: Mobility status         Time: 1610-9604 PT Time Calculation (min) (ACUTE ONLY): 48 min   Charges:   PT Evaluation $Initial PT Evaluation Tier I: 1 Procedure PT Treatments $Therapeutic Activity: 8-22 mins $Self Care/Home Management: 8-22   PT G CodesBerline Lopes 04/17/2015, 4:44 PM Jasline Buskirk,PT Acute Rehabilitation [phone] (pager)

## 2015-04-02 NOTE — Care Management Note (Signed)
Case Management Note  Patient Details  Name: Vincent PigeonRobert T Coley MRN: [id number] Date of Birth: Jan 31, 1925  Subjective/Objective:                    Action/Plan:   Expected Discharge Date:   04/02/15               Expected Discharge Plan:  Home w Home Health Services  In-House Referral:     Discharge planning Services  CM Consult, Follow-up appt scheduled  Post Acute Care Choice:  Home Health, Durable Medical Equipment Choice offered to:  Spouse  DME Arranged:  IV pump/equipment DME Agency:  [facility]  HH Arranged:  RN, PT, OT, Nurse's Aide HH Agency:  [facility]  Status of Service:  Completed, signed off  Medicare Important Message Given:  Yes Date Medicare IM Given:  03/31/15 Medicare IM give by:  Verdis PrimeHenrietta Mayo RN Date Additional Medicare IM Given:    Additional Medicare Important Message give by:     If discussed at Long Length of Stay Meetings, dates discussed:    Additional Comments:have asked [facility] rep donna to add [facility] worker also. [facility] aware pt for dc home today.  [name], Vanda Waskey T, RN 04/02/2015, 11:45 AM

## 2015-04-03 DIAGNOSIS — I70235 Atherosclerosis of native arteries of right leg with ulceration of other part of foot: Secondary | ICD-10-CM | POA: Diagnosis not present

## 2015-04-03 DIAGNOSIS — M869 Osteomyelitis, unspecified: Secondary | ICD-10-CM | POA: Diagnosis not present

## 2015-04-03 DIAGNOSIS — N183 Chronic kidney disease, stage 3 (moderate): Secondary | ICD-10-CM | POA: Diagnosis not present

## 2015-04-03 DIAGNOSIS — I70245 Atherosclerosis of native arteries of left leg with ulceration of other part of foot: Secondary | ICD-10-CM | POA: Diagnosis not present

## 2015-04-03 DIAGNOSIS — E43 Unspecified severe protein-calorie malnutrition: Secondary | ICD-10-CM | POA: Diagnosis not present

## 2015-04-03 DIAGNOSIS — Z452 Encounter for adjustment and management of vascular access device: Secondary | ICD-10-CM | POA: Diagnosis not present

## 2015-04-03 DIAGNOSIS — I96 Gangrene, not elsewhere classified: Secondary | ICD-10-CM | POA: Diagnosis not present

## 2015-04-03 DIAGNOSIS — Z4800 Encounter for change or removal of nonsurgical wound dressing: Secondary | ICD-10-CM | POA: Diagnosis not present

## 2015-04-03 DIAGNOSIS — I739 Peripheral vascular disease, unspecified: Secondary | ICD-10-CM | POA: Diagnosis not present

## 2015-04-03 DIAGNOSIS — L97523 Non-pressure chronic ulcer of other part of left foot with necrosis of muscle: Secondary | ICD-10-CM | POA: Diagnosis not present

## 2015-04-03 DIAGNOSIS — R6520 Severe sepsis without septic shock: Secondary | ICD-10-CM | POA: Diagnosis not present

## 2015-04-03 DIAGNOSIS — I129 Hypertensive chronic kidney disease with stage 1 through stage 4 chronic kidney disease, or unspecified chronic kidney disease: Secondary | ICD-10-CM | POA: Diagnosis not present

## 2015-04-03 DIAGNOSIS — L97504 Non-pressure chronic ulcer of other part of unspecified foot with necrosis of bone: Secondary | ICD-10-CM | POA: Diagnosis not present

## 2015-04-05 DIAGNOSIS — L97504 Non-pressure chronic ulcer of other part of unspecified foot with necrosis of bone: Secondary | ICD-10-CM | POA: Diagnosis not present

## 2015-04-05 DIAGNOSIS — M869 Osteomyelitis, unspecified: Secondary | ICD-10-CM | POA: Diagnosis not present

## 2015-04-05 DIAGNOSIS — I739 Peripheral vascular disease, unspecified: Secondary | ICD-10-CM | POA: Diagnosis not present

## 2015-04-05 DIAGNOSIS — R6520 Severe sepsis without septic shock: Secondary | ICD-10-CM | POA: Diagnosis not present

## 2015-04-05 DIAGNOSIS — I5043 Acute on chronic combined systolic (congestive) and diastolic (congestive) heart failure: Secondary | ICD-10-CM | POA: Diagnosis not present

## 2015-04-05 DIAGNOSIS — I70235 Atherosclerosis of native arteries of right leg with ulceration of other part of foot: Secondary | ICD-10-CM | POA: Diagnosis not present

## 2015-04-05 DIAGNOSIS — I96 Gangrene, not elsewhere classified: Secondary | ICD-10-CM | POA: Diagnosis not present

## 2015-04-08 ENCOUNTER — Encounter (HOSPITAL_BASED_OUTPATIENT_CLINIC_OR_DEPARTMENT_OTHER): Payer: Medicare Other | Attending: Internal Medicine

## 2015-04-08 DIAGNOSIS — L97504 Non-pressure chronic ulcer of other part of unspecified foot with necrosis of bone: Secondary | ICD-10-CM | POA: Diagnosis not present

## 2015-04-08 DIAGNOSIS — I70235 Atherosclerosis of native arteries of right leg with ulceration of other part of foot: Secondary | ICD-10-CM | POA: Diagnosis not present

## 2015-04-08 DIAGNOSIS — I739 Peripheral vascular disease, unspecified: Secondary | ICD-10-CM | POA: Diagnosis not present

## 2015-04-08 DIAGNOSIS — I96 Gangrene, not elsewhere classified: Secondary | ICD-10-CM | POA: Diagnosis not present

## 2015-04-08 DIAGNOSIS — R6520 Severe sepsis without septic shock: Secondary | ICD-10-CM | POA: Diagnosis not present

## 2015-04-08 DIAGNOSIS — M869 Osteomyelitis, unspecified: Secondary | ICD-10-CM | POA: Diagnosis not present

## 2015-04-08 DIAGNOSIS — I5043 Acute on chronic combined systolic (congestive) and diastolic (congestive) heart failure: Secondary | ICD-10-CM | POA: Diagnosis not present

## 2015-04-11 DIAGNOSIS — M869 Osteomyelitis, unspecified: Secondary | ICD-10-CM | POA: Diagnosis not present

## 2015-04-11 DIAGNOSIS — I739 Peripheral vascular disease, unspecified: Secondary | ICD-10-CM | POA: Diagnosis not present

## 2015-04-11 DIAGNOSIS — I96 Gangrene, not elsewhere classified: Secondary | ICD-10-CM | POA: Diagnosis not present

## 2015-04-11 DIAGNOSIS — L97504 Non-pressure chronic ulcer of other part of unspecified foot with necrosis of bone: Secondary | ICD-10-CM | POA: Diagnosis not present

## 2015-04-11 DIAGNOSIS — I70235 Atherosclerosis of native arteries of right leg with ulceration of other part of foot: Secondary | ICD-10-CM | POA: Diagnosis not present

## 2015-04-11 DIAGNOSIS — N390 Urinary tract infection, site not specified: Secondary | ICD-10-CM | POA: Diagnosis not present

## 2015-04-11 DIAGNOSIS — R6520 Severe sepsis without septic shock: Secondary | ICD-10-CM | POA: Diagnosis not present

## 2015-04-12 ENCOUNTER — Telehealth: Payer: Self-pay | Admitting: Infectious Diseases

## 2015-04-12 NOTE — Telephone Encounter (Signed)
Called as pt's Cr is 2.3 now.  His Vanco Tr was 24 Will change his dose to 500mg  q24 Repeat labs later this week.

## 2015-04-15 ENCOUNTER — Telehealth: Payer: Self-pay | Admitting: *Deleted

## 2015-04-15 DIAGNOSIS — I70235 Atherosclerosis of native arteries of right leg with ulceration of other part of foot: Secondary | ICD-10-CM | POA: Diagnosis not present

## 2015-04-15 DIAGNOSIS — I96 Gangrene, not elsewhere classified: Secondary | ICD-10-CM | POA: Diagnosis not present

## 2015-04-15 DIAGNOSIS — L97504 Non-pressure chronic ulcer of other part of unspecified foot with necrosis of bone: Secondary | ICD-10-CM | POA: Diagnosis not present

## 2015-04-15 DIAGNOSIS — R6520 Severe sepsis without septic shock: Secondary | ICD-10-CM | POA: Diagnosis not present

## 2015-04-15 DIAGNOSIS — I739 Peripheral vascular disease, unspecified: Secondary | ICD-10-CM | POA: Diagnosis not present

## 2015-04-15 DIAGNOSIS — I5043 Acute on chronic combined systolic (congestive) and diastolic (congestive) heart failure: Secondary | ICD-10-CM | POA: Diagnosis not present

## 2015-04-15 DIAGNOSIS — M869 Osteomyelitis, unspecified: Secondary | ICD-10-CM | POA: Diagnosis not present

## 2015-04-15 NOTE — Telephone Encounter (Signed)
Call from [place] at [facility] about the patient Creat level today it is 2.56 on 04/11/15 it was 2.31. He also takes Envance 500 mg q24 which is renally adjusted as well vanc. He was wanting to ask Dr Vincent Miranda what to do going forward as the patient level is inching up. Called the doctor and given verbal order.  Per Dr Vincent Miranda called Vincent FifeLynn and advised to adjust the patient vanc down to therapy level and keep watch on his creat and vanc trough. Vincent FifeLynn advised they will adjust the patient dosing of Vanc to every other day. And do troughs more often as he is worried about the patient Creat.  The doctor is aware.

## 2015-04-18 DIAGNOSIS — I70235 Atherosclerosis of native arteries of right leg with ulceration of other part of foot: Secondary | ICD-10-CM | POA: Diagnosis not present

## 2015-04-18 DIAGNOSIS — I739 Peripheral vascular disease, unspecified: Secondary | ICD-10-CM | POA: Diagnosis not present

## 2015-04-18 DIAGNOSIS — I96 Gangrene, not elsewhere classified: Secondary | ICD-10-CM | POA: Diagnosis not present

## 2015-04-18 DIAGNOSIS — R6520 Severe sepsis without septic shock: Secondary | ICD-10-CM | POA: Diagnosis not present

## 2015-04-18 DIAGNOSIS — L97504 Non-pressure chronic ulcer of other part of unspecified foot with necrosis of bone: Secondary | ICD-10-CM | POA: Diagnosis not present

## 2015-04-18 DIAGNOSIS — M869 Osteomyelitis, unspecified: Secondary | ICD-10-CM | POA: Diagnosis not present

## 2015-04-20 ENCOUNTER — Telehealth: Payer: Self-pay | Admitting: *Deleted

## 2015-04-20 NOTE — Telephone Encounter (Signed)
Multiple lab results out of range, no hospital follow up scheduled.  RN requested Larita FifeLynn, [facility] Dr. Ninetta LightsHatcher. Andree CossHowell, Macguire Holsinger M, RN

## 2015-04-21 DIAGNOSIS — I739 Peripheral vascular disease, unspecified: Secondary | ICD-10-CM | POA: Diagnosis not present

## 2015-04-21 DIAGNOSIS — R6520 Severe sepsis without septic shock: Secondary | ICD-10-CM | POA: Diagnosis not present

## 2015-04-21 DIAGNOSIS — L97504 Non-pressure chronic ulcer of other part of unspecified foot with necrosis of bone: Secondary | ICD-10-CM | POA: Diagnosis not present

## 2015-04-21 DIAGNOSIS — I96 Gangrene, not elsewhere classified: Secondary | ICD-10-CM | POA: Diagnosis not present

## 2015-04-21 DIAGNOSIS — M869 Osteomyelitis, unspecified: Secondary | ICD-10-CM | POA: Diagnosis not present

## 2015-04-21 DIAGNOSIS — I70235 Atherosclerosis of native arteries of right leg with ulceration of other part of foot: Secondary | ICD-10-CM | POA: Diagnosis not present

## 2015-04-22 ENCOUNTER — Telehealth: Payer: Self-pay | Admitting: *Deleted

## 2015-04-22 DIAGNOSIS — L97504 Non-pressure chronic ulcer of other part of unspecified foot with necrosis of bone: Secondary | ICD-10-CM | POA: Diagnosis not present

## 2015-04-22 DIAGNOSIS — I70235 Atherosclerosis of native arteries of right leg with ulceration of other part of foot: Secondary | ICD-10-CM | POA: Diagnosis not present

## 2015-04-22 DIAGNOSIS — I96 Gangrene, not elsewhere classified: Secondary | ICD-10-CM | POA: Diagnosis not present

## 2015-04-22 DIAGNOSIS — M869 Osteomyelitis, unspecified: Secondary | ICD-10-CM | POA: Diagnosis not present

## 2015-04-22 DIAGNOSIS — I739 Peripheral vascular disease, unspecified: Secondary | ICD-10-CM | POA: Diagnosis not present

## 2015-04-22 DIAGNOSIS — R6520 Severe sepsis without septic shock: Secondary | ICD-10-CM | POA: Diagnosis not present

## 2015-04-22 NOTE — Telephone Encounter (Signed)
Vincent Miranda with [facility] called to inform Dr. Ninetta LightsHatcher that patient's creatinine is now 1.85, vanc trough 13.5 and potassium 3.4. Copy of labs faxed and placed in MDs box. Vancomycin is being increased to 750 mg every 48 hours. Vincent Miranda

## 2015-04-22 NOTE — Telephone Encounter (Signed)
thanks

## 2015-04-26 DIAGNOSIS — M869 Osteomyelitis, unspecified: Secondary | ICD-10-CM | POA: Diagnosis not present

## 2015-04-26 DIAGNOSIS — I739 Peripheral vascular disease, unspecified: Secondary | ICD-10-CM | POA: Diagnosis not present

## 2015-04-26 DIAGNOSIS — L97504 Non-pressure chronic ulcer of other part of unspecified foot with necrosis of bone: Secondary | ICD-10-CM | POA: Diagnosis not present

## 2015-04-26 DIAGNOSIS — I70235 Atherosclerosis of native arteries of right leg with ulceration of other part of foot: Secondary | ICD-10-CM | POA: Diagnosis not present

## 2015-04-26 DIAGNOSIS — R6520 Severe sepsis without septic shock: Secondary | ICD-10-CM | POA: Diagnosis not present

## 2015-04-26 DIAGNOSIS — I96 Gangrene, not elsewhere classified: Secondary | ICD-10-CM | POA: Diagnosis not present

## 2015-04-26 DIAGNOSIS — I5043 Acute on chronic combined systolic (congestive) and diastolic (congestive) heart failure: Secondary | ICD-10-CM | POA: Diagnosis not present

## 2015-04-27 DIAGNOSIS — I96 Gangrene, not elsewhere classified: Secondary | ICD-10-CM | POA: Diagnosis not present

## 2015-04-27 DIAGNOSIS — L97504 Non-pressure chronic ulcer of other part of unspecified foot with necrosis of bone: Secondary | ICD-10-CM | POA: Diagnosis not present

## 2015-04-27 DIAGNOSIS — I70235 Atherosclerosis of native arteries of right leg with ulceration of other part of foot: Secondary | ICD-10-CM | POA: Diagnosis not present

## 2015-04-27 DIAGNOSIS — R6520 Severe sepsis without septic shock: Secondary | ICD-10-CM | POA: Diagnosis not present

## 2015-04-27 DIAGNOSIS — I5043 Acute on chronic combined systolic (congestive) and diastolic (congestive) heart failure: Secondary | ICD-10-CM | POA: Diagnosis not present

## 2015-04-27 DIAGNOSIS — M869 Osteomyelitis, unspecified: Secondary | ICD-10-CM | POA: Diagnosis not present

## 2015-04-27 DIAGNOSIS — I739 Peripheral vascular disease, unspecified: Secondary | ICD-10-CM | POA: Diagnosis not present

## 2015-04-29 ENCOUNTER — Telehealth: Payer: Self-pay | Admitting: Licensed Clinical Social Worker

## 2015-04-29 DIAGNOSIS — L97504 Non-pressure chronic ulcer of other part of unspecified foot with necrosis of bone: Secondary | ICD-10-CM | POA: Diagnosis not present

## 2015-04-29 DIAGNOSIS — I739 Peripheral vascular disease, unspecified: Secondary | ICD-10-CM | POA: Diagnosis not present

## 2015-04-29 DIAGNOSIS — I96 Gangrene, not elsewhere classified: Secondary | ICD-10-CM | POA: Diagnosis not present

## 2015-04-29 DIAGNOSIS — I70235 Atherosclerosis of native arteries of right leg with ulceration of other part of foot: Secondary | ICD-10-CM | POA: Diagnosis not present

## 2015-04-29 DIAGNOSIS — I5043 Acute on chronic combined systolic (congestive) and diastolic (congestive) heart failure: Secondary | ICD-10-CM | POA: Diagnosis not present

## 2015-04-29 DIAGNOSIS — R6520 Severe sepsis without septic shock: Secondary | ICD-10-CM | POA: Diagnosis not present

## 2015-04-29 DIAGNOSIS — M869 Osteomyelitis, unspecified: Secondary | ICD-10-CM | POA: Diagnosis not present

## 2015-04-29 NOTE — Telephone Encounter (Signed)
Linn from [facility] pharmacy called in results of hemoglobin of 7.5 and hematocrit of 23.6 this is down from last week. I called Dr. Bruna Potter office and received the office voicemail. Called [facility] spoke to [place] and he is faxing those results to Dr. Bruna Potter office

## 2015-05-03 DIAGNOSIS — I5043 Acute on chronic combined systolic (congestive) and diastolic (congestive) heart failure: Secondary | ICD-10-CM | POA: Diagnosis not present

## 2015-05-04 DIAGNOSIS — L97504 Non-pressure chronic ulcer of other part of unspecified foot with necrosis of bone: Secondary | ICD-10-CM | POA: Diagnosis not present

## 2015-05-04 DIAGNOSIS — I96 Gangrene, not elsewhere classified: Secondary | ICD-10-CM | POA: Diagnosis not present

## 2015-05-04 DIAGNOSIS — R6520 Severe sepsis without septic shock: Secondary | ICD-10-CM | POA: Diagnosis not present

## 2015-05-04 DIAGNOSIS — I739 Peripheral vascular disease, unspecified: Secondary | ICD-10-CM | POA: Diagnosis not present

## 2015-05-04 DIAGNOSIS — M869 Osteomyelitis, unspecified: Secondary | ICD-10-CM | POA: Diagnosis not present

## 2015-05-04 DIAGNOSIS — I70235 Atherosclerosis of native arteries of right leg with ulceration of other part of foot: Secondary | ICD-10-CM | POA: Diagnosis not present

## 2015-05-06 DIAGNOSIS — M869 Osteomyelitis, unspecified: Secondary | ICD-10-CM | POA: Diagnosis not present

## 2015-05-06 DIAGNOSIS — R6520 Severe sepsis without septic shock: Secondary | ICD-10-CM | POA: Diagnosis not present

## 2015-05-06 DIAGNOSIS — I5043 Acute on chronic combined systolic (congestive) and diastolic (congestive) heart failure: Secondary | ICD-10-CM | POA: Diagnosis not present

## 2015-05-06 DIAGNOSIS — I739 Peripheral vascular disease, unspecified: Secondary | ICD-10-CM | POA: Diagnosis not present

## 2015-05-06 DIAGNOSIS — I96 Gangrene, not elsewhere classified: Secondary | ICD-10-CM | POA: Diagnosis not present

## 2015-05-06 DIAGNOSIS — I70235 Atherosclerosis of native arteries of right leg with ulceration of other part of foot: Secondary | ICD-10-CM | POA: Diagnosis not present

## 2015-05-06 DIAGNOSIS — L97504 Non-pressure chronic ulcer of other part of unspecified foot with necrosis of bone: Secondary | ICD-10-CM | POA: Diagnosis not present

## 2015-05-11 DIAGNOSIS — I70235 Atherosclerosis of native arteries of right leg with ulceration of other part of foot: Secondary | ICD-10-CM | POA: Diagnosis not present

## 2015-05-11 DIAGNOSIS — I739 Peripheral vascular disease, unspecified: Secondary | ICD-10-CM | POA: Diagnosis not present

## 2015-05-11 DIAGNOSIS — I96 Gangrene, not elsewhere classified: Secondary | ICD-10-CM | POA: Diagnosis not present

## 2015-05-11 DIAGNOSIS — L97504 Non-pressure chronic ulcer of other part of unspecified foot with necrosis of bone: Secondary | ICD-10-CM | POA: Diagnosis not present

## 2015-05-11 DIAGNOSIS — M869 Osteomyelitis, unspecified: Secondary | ICD-10-CM | POA: Diagnosis not present

## 2015-05-11 DIAGNOSIS — I5043 Acute on chronic combined systolic (congestive) and diastolic (congestive) heart failure: Secondary | ICD-10-CM | POA: Diagnosis not present

## 2015-05-11 DIAGNOSIS — R6520 Severe sepsis without septic shock: Secondary | ICD-10-CM | POA: Diagnosis not present

## 2015-05-13 DIAGNOSIS — R6520 Severe sepsis without septic shock: Secondary | ICD-10-CM | POA: Diagnosis not present

## 2015-05-13 DIAGNOSIS — I739 Peripheral vascular disease, unspecified: Secondary | ICD-10-CM | POA: Diagnosis not present

## 2015-05-13 DIAGNOSIS — I5043 Acute on chronic combined systolic (congestive) and diastolic (congestive) heart failure: Secondary | ICD-10-CM | POA: Diagnosis not present

## 2015-05-13 DIAGNOSIS — L97504 Non-pressure chronic ulcer of other part of unspecified foot with necrosis of bone: Secondary | ICD-10-CM | POA: Diagnosis not present

## 2015-05-13 DIAGNOSIS — I96 Gangrene, not elsewhere classified: Secondary | ICD-10-CM | POA: Diagnosis not present

## 2015-05-13 DIAGNOSIS — M869 Osteomyelitis, unspecified: Secondary | ICD-10-CM | POA: Diagnosis not present

## 2015-05-13 DIAGNOSIS — I70235 Atherosclerosis of native arteries of right leg with ulceration of other part of foot: Secondary | ICD-10-CM | POA: Diagnosis not present

## 2015-05-14 ENCOUNTER — Telehealth: Payer: Self-pay | Admitting: Licensed Clinical Social Worker

## 2015-05-14 DIAGNOSIS — L97504 Non-pressure chronic ulcer of other part of unspecified foot with necrosis of bone: Secondary | ICD-10-CM | POA: Diagnosis not present

## 2015-05-14 DIAGNOSIS — I70235 Atherosclerosis of native arteries of right leg with ulceration of other part of foot: Secondary | ICD-10-CM | POA: Diagnosis not present

## 2015-05-14 DIAGNOSIS — R6520 Severe sepsis without septic shock: Secondary | ICD-10-CM | POA: Diagnosis not present

## 2015-05-14 DIAGNOSIS — I96 Gangrene, not elsewhere classified: Secondary | ICD-10-CM | POA: Diagnosis not present

## 2015-05-14 DIAGNOSIS — I739 Peripheral vascular disease, unspecified: Secondary | ICD-10-CM | POA: Diagnosis not present

## 2015-05-14 DIAGNOSIS — M869 Osteomyelitis, unspecified: Secondary | ICD-10-CM | POA: Diagnosis not present

## 2015-05-14 NOTE — Telephone Encounter (Signed)
Selena Batten, RN with [facility] went to pull patient's PICC line and the wife said not to, she spoke with the PCP Dr. Bruna Potter and she ordered it to stay in and the wife would continue to flush with heparin. RN was calling to see if that was ok with Dr. Luciana Axe. PEr Dr. Luciana Axe he does not want the patient to keep the Picc in and it needs to be pulled. This could be more harmful because of the risk for PICC line infection. RN stated that she would follow through with the order to pull the PICC line.

## 2015-05-18 DIAGNOSIS — R6520 Severe sepsis without septic shock: Secondary | ICD-10-CM | POA: Diagnosis not present

## 2015-05-18 DIAGNOSIS — M869 Osteomyelitis, unspecified: Secondary | ICD-10-CM | POA: Diagnosis not present

## 2015-05-18 DIAGNOSIS — L97504 Non-pressure chronic ulcer of other part of unspecified foot with necrosis of bone: Secondary | ICD-10-CM | POA: Diagnosis not present

## 2015-05-18 DIAGNOSIS — I96 Gangrene, not elsewhere classified: Secondary | ICD-10-CM | POA: Diagnosis not present

## 2015-05-18 DIAGNOSIS — I739 Peripheral vascular disease, unspecified: Secondary | ICD-10-CM | POA: Diagnosis not present

## 2015-05-18 DIAGNOSIS — I70235 Atherosclerosis of native arteries of right leg with ulceration of other part of foot: Secondary | ICD-10-CM | POA: Diagnosis not present

## 2015-05-20 DIAGNOSIS — I739 Peripheral vascular disease, unspecified: Secondary | ICD-10-CM | POA: Diagnosis not present

## 2015-05-20 DIAGNOSIS — I96 Gangrene, not elsewhere classified: Secondary | ICD-10-CM | POA: Diagnosis not present

## 2015-05-20 DIAGNOSIS — R6520 Severe sepsis without septic shock: Secondary | ICD-10-CM | POA: Diagnosis not present

## 2015-05-20 DIAGNOSIS — M869 Osteomyelitis, unspecified: Secondary | ICD-10-CM | POA: Diagnosis not present

## 2015-05-20 DIAGNOSIS — I70235 Atherosclerosis of native arteries of right leg with ulceration of other part of foot: Secondary | ICD-10-CM | POA: Diagnosis not present

## 2015-05-20 DIAGNOSIS — L97504 Non-pressure chronic ulcer of other part of unspecified foot with necrosis of bone: Secondary | ICD-10-CM | POA: Diagnosis not present

## 2015-05-25 DIAGNOSIS — M869 Osteomyelitis, unspecified: Secondary | ICD-10-CM | POA: Diagnosis not present

## 2015-05-25 DIAGNOSIS — R6520 Severe sepsis without septic shock: Secondary | ICD-10-CM | POA: Diagnosis not present

## 2015-05-25 DIAGNOSIS — I739 Peripheral vascular disease, unspecified: Secondary | ICD-10-CM | POA: Diagnosis not present

## 2015-05-25 DIAGNOSIS — I96 Gangrene, not elsewhere classified: Secondary | ICD-10-CM | POA: Diagnosis not present

## 2015-05-25 DIAGNOSIS — L97504 Non-pressure chronic ulcer of other part of unspecified foot with necrosis of bone: Secondary | ICD-10-CM | POA: Diagnosis not present

## 2015-05-25 DIAGNOSIS — I70235 Atherosclerosis of native arteries of right leg with ulceration of other part of foot: Secondary | ICD-10-CM | POA: Diagnosis not present

## 2015-05-27 DIAGNOSIS — L97504 Non-pressure chronic ulcer of other part of unspecified foot with necrosis of bone: Secondary | ICD-10-CM | POA: Diagnosis not present

## 2015-05-27 DIAGNOSIS — I70235 Atherosclerosis of native arteries of right leg with ulceration of other part of foot: Secondary | ICD-10-CM | POA: Diagnosis not present

## 2015-05-27 DIAGNOSIS — R6520 Severe sepsis without septic shock: Secondary | ICD-10-CM | POA: Diagnosis not present

## 2015-05-27 DIAGNOSIS — I739 Peripheral vascular disease, unspecified: Secondary | ICD-10-CM | POA: Diagnosis not present

## 2015-05-27 DIAGNOSIS — I96 Gangrene, not elsewhere classified: Secondary | ICD-10-CM | POA: Diagnosis not present

## 2015-05-27 DIAGNOSIS — M869 Osteomyelitis, unspecified: Secondary | ICD-10-CM | POA: Diagnosis not present

## 2015-06-01 ENCOUNTER — Encounter: Payer: Self-pay | Admitting: Infectious Diseases

## 2015-06-02 DIAGNOSIS — I129 Hypertensive chronic kidney disease with stage 1 through stage 4 chronic kidney disease, or unspecified chronic kidney disease: Secondary | ICD-10-CM | POA: Diagnosis not present

## 2015-06-02 DIAGNOSIS — E43 Unspecified severe protein-calorie malnutrition: Secondary | ICD-10-CM | POA: Diagnosis not present

## 2015-06-02 DIAGNOSIS — Z4800 Encounter for change or removal of nonsurgical wound dressing: Secondary | ICD-10-CM | POA: Diagnosis not present

## 2015-06-02 DIAGNOSIS — L97523 Non-pressure chronic ulcer of other part of left foot with necrosis of muscle: Secondary | ICD-10-CM | POA: Diagnosis not present

## 2015-06-02 DIAGNOSIS — L97504 Non-pressure chronic ulcer of other part of unspecified foot with necrosis of bone: Secondary | ICD-10-CM | POA: Diagnosis not present

## 2015-06-02 DIAGNOSIS — N183 Chronic kidney disease, stage 3 (moderate): Secondary | ICD-10-CM | POA: Diagnosis not present

## 2015-06-02 DIAGNOSIS — I70263 Atherosclerosis of native arteries of extremities with gangrene, bilateral legs: Secondary | ICD-10-CM | POA: Diagnosis not present

## 2015-06-09 DIAGNOSIS — I129 Hypertensive chronic kidney disease with stage 1 through stage 4 chronic kidney disease, or unspecified chronic kidney disease: Secondary | ICD-10-CM | POA: Diagnosis not present

## 2015-06-09 DIAGNOSIS — N183 Chronic kidney disease, stage 3 (moderate): Secondary | ICD-10-CM | POA: Diagnosis not present

## 2015-06-09 DIAGNOSIS — L97504 Non-pressure chronic ulcer of other part of unspecified foot with necrosis of bone: Secondary | ICD-10-CM | POA: Diagnosis not present

## 2015-06-09 DIAGNOSIS — I70263 Atherosclerosis of native arteries of extremities with gangrene, bilateral legs: Secondary | ICD-10-CM | POA: Diagnosis not present

## 2015-06-09 DIAGNOSIS — L97523 Non-pressure chronic ulcer of other part of left foot with necrosis of muscle: Secondary | ICD-10-CM | POA: Diagnosis not present

## 2015-06-09 DIAGNOSIS — E43 Unspecified severe protein-calorie malnutrition: Secondary | ICD-10-CM | POA: Diagnosis not present

## 2015-06-16 DIAGNOSIS — I129 Hypertensive chronic kidney disease with stage 1 through stage 4 chronic kidney disease, or unspecified chronic kidney disease: Secondary | ICD-10-CM | POA: Diagnosis not present

## 2015-06-16 DIAGNOSIS — N183 Chronic kidney disease, stage 3 (moderate): Secondary | ICD-10-CM | POA: Diagnosis not present

## 2015-06-16 DIAGNOSIS — E43 Unspecified severe protein-calorie malnutrition: Secondary | ICD-10-CM | POA: Diagnosis not present

## 2015-06-16 DIAGNOSIS — I5043 Acute on chronic combined systolic (congestive) and diastolic (congestive) heart failure: Secondary | ICD-10-CM | POA: Diagnosis not present

## 2015-06-16 DIAGNOSIS — I70263 Atherosclerosis of native arteries of extremities with gangrene, bilateral legs: Secondary | ICD-10-CM | POA: Diagnosis not present

## 2015-06-16 DIAGNOSIS — L97523 Non-pressure chronic ulcer of other part of left foot with necrosis of muscle: Secondary | ICD-10-CM | POA: Diagnosis not present

## 2015-06-16 DIAGNOSIS — L97504 Non-pressure chronic ulcer of other part of unspecified foot with necrosis of bone: Secondary | ICD-10-CM | POA: Diagnosis not present

## 2015-06-23 DIAGNOSIS — I129 Hypertensive chronic kidney disease with stage 1 through stage 4 chronic kidney disease, or unspecified chronic kidney disease: Secondary | ICD-10-CM | POA: Diagnosis not present

## 2015-06-23 DIAGNOSIS — L97523 Non-pressure chronic ulcer of other part of left foot with necrosis of muscle: Secondary | ICD-10-CM | POA: Diagnosis not present

## 2015-06-23 DIAGNOSIS — L97504 Non-pressure chronic ulcer of other part of unspecified foot with necrosis of bone: Secondary | ICD-10-CM | POA: Diagnosis not present

## 2015-06-23 DIAGNOSIS — I70263 Atherosclerosis of native arteries of extremities with gangrene, bilateral legs: Secondary | ICD-10-CM | POA: Diagnosis not present

## 2015-06-23 DIAGNOSIS — E43 Unspecified severe protein-calorie malnutrition: Secondary | ICD-10-CM | POA: Diagnosis not present

## 2015-06-23 DIAGNOSIS — N183 Chronic kidney disease, stage 3 (moderate): Secondary | ICD-10-CM | POA: Diagnosis not present

## 2015-06-24 DIAGNOSIS — L97504 Non-pressure chronic ulcer of other part of unspecified foot with necrosis of bone: Secondary | ICD-10-CM | POA: Diagnosis not present

## 2015-06-24 DIAGNOSIS — I70263 Atherosclerosis of native arteries of extremities with gangrene, bilateral legs: Secondary | ICD-10-CM | POA: Diagnosis not present

## 2015-06-24 DIAGNOSIS — N183 Chronic kidney disease, stage 3 (moderate): Secondary | ICD-10-CM | POA: Diagnosis not present

## 2015-06-24 DIAGNOSIS — I129 Hypertensive chronic kidney disease with stage 1 through stage 4 chronic kidney disease, or unspecified chronic kidney disease: Secondary | ICD-10-CM | POA: Diagnosis not present

## 2015-06-24 DIAGNOSIS — L97523 Non-pressure chronic ulcer of other part of left foot with necrosis of muscle: Secondary | ICD-10-CM | POA: Diagnosis not present

## 2015-06-24 DIAGNOSIS — E43 Unspecified severe protein-calorie malnutrition: Secondary | ICD-10-CM | POA: Diagnosis not present

## 2015-07-02 DIAGNOSIS — E43 Unspecified severe protein-calorie malnutrition: Secondary | ICD-10-CM | POA: Diagnosis not present

## 2015-07-02 DIAGNOSIS — N183 Chronic kidney disease, stage 3 (moderate): Secondary | ICD-10-CM | POA: Diagnosis not present

## 2015-07-02 DIAGNOSIS — I70263 Atherosclerosis of native arteries of extremities with gangrene, bilateral legs: Secondary | ICD-10-CM | POA: Diagnosis not present

## 2015-07-02 DIAGNOSIS — L97523 Non-pressure chronic ulcer of other part of left foot with necrosis of muscle: Secondary | ICD-10-CM | POA: Diagnosis not present

## 2015-07-02 DIAGNOSIS — I129 Hypertensive chronic kidney disease with stage 1 through stage 4 chronic kidney disease, or unspecified chronic kidney disease: Secondary | ICD-10-CM | POA: Diagnosis not present

## 2015-07-02 DIAGNOSIS — L97504 Non-pressure chronic ulcer of other part of unspecified foot with necrosis of bone: Secondary | ICD-10-CM | POA: Diagnosis not present

## 2015-07-06 ENCOUNTER — Encounter: Payer: Self-pay | Admitting: Infectious Diseases

## 2015-07-07 DIAGNOSIS — E43 Unspecified severe protein-calorie malnutrition: Secondary | ICD-10-CM | POA: Diagnosis not present

## 2015-07-07 DIAGNOSIS — N183 Chronic kidney disease, stage 3 (moderate): Secondary | ICD-10-CM | POA: Diagnosis not present

## 2015-07-07 DIAGNOSIS — L97504 Non-pressure chronic ulcer of other part of unspecified foot with necrosis of bone: Secondary | ICD-10-CM | POA: Diagnosis not present

## 2015-07-07 DIAGNOSIS — I70263 Atherosclerosis of native arteries of extremities with gangrene, bilateral legs: Secondary | ICD-10-CM | POA: Diagnosis not present

## 2015-07-07 DIAGNOSIS — L97523 Non-pressure chronic ulcer of other part of left foot with necrosis of muscle: Secondary | ICD-10-CM | POA: Diagnosis not present

## 2015-07-07 DIAGNOSIS — I129 Hypertensive chronic kidney disease with stage 1 through stage 4 chronic kidney disease, or unspecified chronic kidney disease: Secondary | ICD-10-CM | POA: Diagnosis not present

## 2015-07-14 DIAGNOSIS — I70263 Atherosclerosis of native arteries of extremities with gangrene, bilateral legs: Secondary | ICD-10-CM | POA: Diagnosis not present

## 2015-07-14 DIAGNOSIS — E43 Unspecified severe protein-calorie malnutrition: Secondary | ICD-10-CM | POA: Diagnosis not present

## 2015-07-14 DIAGNOSIS — N183 Chronic kidney disease, stage 3 (moderate): Secondary | ICD-10-CM | POA: Diagnosis not present

## 2015-07-14 DIAGNOSIS — I129 Hypertensive chronic kidney disease with stage 1 through stage 4 chronic kidney disease, or unspecified chronic kidney disease: Secondary | ICD-10-CM | POA: Diagnosis not present

## 2015-07-14 DIAGNOSIS — L97504 Non-pressure chronic ulcer of other part of unspecified foot with necrosis of bone: Secondary | ICD-10-CM | POA: Diagnosis not present

## 2015-07-14 DIAGNOSIS — L97523 Non-pressure chronic ulcer of other part of left foot with necrosis of muscle: Secondary | ICD-10-CM | POA: Diagnosis not present

## 2015-07-21 DIAGNOSIS — L97523 Non-pressure chronic ulcer of other part of left foot with necrosis of muscle: Secondary | ICD-10-CM | POA: Diagnosis not present

## 2015-07-21 DIAGNOSIS — I70263 Atherosclerosis of native arteries of extremities with gangrene, bilateral legs: Secondary | ICD-10-CM | POA: Diagnosis not present

## 2015-07-21 DIAGNOSIS — E43 Unspecified severe protein-calorie malnutrition: Secondary | ICD-10-CM | POA: Diagnosis not present

## 2015-07-21 DIAGNOSIS — N183 Chronic kidney disease, stage 3 (moderate): Secondary | ICD-10-CM | POA: Diagnosis not present

## 2015-07-21 DIAGNOSIS — L97504 Non-pressure chronic ulcer of other part of unspecified foot with necrosis of bone: Secondary | ICD-10-CM | POA: Diagnosis not present

## 2015-07-21 DIAGNOSIS — I129 Hypertensive chronic kidney disease with stage 1 through stage 4 chronic kidney disease, or unspecified chronic kidney disease: Secondary | ICD-10-CM | POA: Diagnosis not present

## 2015-07-28 DIAGNOSIS — I129 Hypertensive chronic kidney disease with stage 1 through stage 4 chronic kidney disease, or unspecified chronic kidney disease: Secondary | ICD-10-CM | POA: Diagnosis not present

## 2015-07-28 DIAGNOSIS — L97523 Non-pressure chronic ulcer of other part of left foot with necrosis of muscle: Secondary | ICD-10-CM | POA: Diagnosis not present

## 2015-07-28 DIAGNOSIS — E43 Unspecified severe protein-calorie malnutrition: Secondary | ICD-10-CM | POA: Diagnosis not present

## 2015-07-28 DIAGNOSIS — N183 Chronic kidney disease, stage 3 (moderate): Secondary | ICD-10-CM | POA: Diagnosis not present

## 2015-07-28 DIAGNOSIS — L97504 Non-pressure chronic ulcer of other part of unspecified foot with necrosis of bone: Secondary | ICD-10-CM | POA: Diagnosis not present

## 2015-07-28 DIAGNOSIS — I70263 Atherosclerosis of native arteries of extremities with gangrene, bilateral legs: Secondary | ICD-10-CM | POA: Diagnosis not present

## 2015-08-01 DIAGNOSIS — Z89421 Acquired absence of other right toe(s): Secondary | ICD-10-CM | POA: Diagnosis not present

## 2015-08-01 DIAGNOSIS — Z89412 Acquired absence of left great toe: Secondary | ICD-10-CM | POA: Diagnosis not present

## 2015-08-01 DIAGNOSIS — T8753 Necrosis of amputation stump, right lower extremity: Secondary | ICD-10-CM | POA: Diagnosis not present

## 2015-08-01 DIAGNOSIS — T8743 Infection of amputation stump, right lower extremity: Secondary | ICD-10-CM | POA: Diagnosis not present

## 2015-08-01 DIAGNOSIS — E43 Unspecified severe protein-calorie malnutrition: Secondary | ICD-10-CM | POA: Diagnosis not present

## 2015-08-01 DIAGNOSIS — Z4800 Encounter for change or removal of nonsurgical wound dressing: Secondary | ICD-10-CM | POA: Diagnosis not present

## 2015-08-01 DIAGNOSIS — N183 Chronic kidney disease, stage 3 (moderate): Secondary | ICD-10-CM | POA: Diagnosis not present

## 2015-08-01 DIAGNOSIS — T8754 Necrosis of amputation stump, left lower extremity: Secondary | ICD-10-CM | POA: Diagnosis not present

## 2015-08-01 DIAGNOSIS — I129 Hypertensive chronic kidney disease with stage 1 through stage 4 chronic kidney disease, or unspecified chronic kidney disease: Secondary | ICD-10-CM | POA: Diagnosis not present

## 2015-08-01 DIAGNOSIS — T8744 Infection of amputation stump, left lower extremity: Secondary | ICD-10-CM | POA: Diagnosis not present

## 2015-08-04 DIAGNOSIS — T8743 Infection of amputation stump, right lower extremity: Secondary | ICD-10-CM | POA: Diagnosis not present

## 2015-08-04 DIAGNOSIS — E43 Unspecified severe protein-calorie malnutrition: Secondary | ICD-10-CM | POA: Diagnosis not present

## 2015-08-04 DIAGNOSIS — I129 Hypertensive chronic kidney disease with stage 1 through stage 4 chronic kidney disease, or unspecified chronic kidney disease: Secondary | ICD-10-CM | POA: Diagnosis not present

## 2015-08-04 DIAGNOSIS — T8744 Infection of amputation stump, left lower extremity: Secondary | ICD-10-CM | POA: Diagnosis not present

## 2015-08-04 DIAGNOSIS — T8754 Necrosis of amputation stump, left lower extremity: Secondary | ICD-10-CM | POA: Diagnosis not present

## 2015-08-04 DIAGNOSIS — T8753 Necrosis of amputation stump, right lower extremity: Secondary | ICD-10-CM | POA: Diagnosis not present

## 2015-08-11 DIAGNOSIS — I129 Hypertensive chronic kidney disease with stage 1 through stage 4 chronic kidney disease, or unspecified chronic kidney disease: Secondary | ICD-10-CM | POA: Diagnosis not present

## 2015-08-11 DIAGNOSIS — T8753 Necrosis of amputation stump, right lower extremity: Secondary | ICD-10-CM | POA: Diagnosis not present

## 2015-08-11 DIAGNOSIS — E43 Unspecified severe protein-calorie malnutrition: Secondary | ICD-10-CM | POA: Diagnosis not present

## 2015-08-11 DIAGNOSIS — T8754 Necrosis of amputation stump, left lower extremity: Secondary | ICD-10-CM | POA: Diagnosis not present

## 2015-08-11 DIAGNOSIS — I5043 Acute on chronic combined systolic (congestive) and diastolic (congestive) heart failure: Secondary | ICD-10-CM | POA: Diagnosis not present

## 2015-08-11 DIAGNOSIS — T8743 Infection of amputation stump, right lower extremity: Secondary | ICD-10-CM | POA: Diagnosis not present

## 2015-08-11 DIAGNOSIS — T8744 Infection of amputation stump, left lower extremity: Secondary | ICD-10-CM | POA: Diagnosis not present

## 2015-08-18 DIAGNOSIS — T8754 Necrosis of amputation stump, left lower extremity: Secondary | ICD-10-CM | POA: Diagnosis not present

## 2015-08-18 DIAGNOSIS — E43 Unspecified severe protein-calorie malnutrition: Secondary | ICD-10-CM | POA: Diagnosis not present

## 2015-08-18 DIAGNOSIS — T8743 Infection of amputation stump, right lower extremity: Secondary | ICD-10-CM | POA: Diagnosis not present

## 2015-08-18 DIAGNOSIS — I129 Hypertensive chronic kidney disease with stage 1 through stage 4 chronic kidney disease, or unspecified chronic kidney disease: Secondary | ICD-10-CM | POA: Diagnosis not present

## 2015-08-18 DIAGNOSIS — T8753 Necrosis of amputation stump, right lower extremity: Secondary | ICD-10-CM | POA: Diagnosis not present

## 2015-08-18 DIAGNOSIS — T8744 Infection of amputation stump, left lower extremity: Secondary | ICD-10-CM | POA: Diagnosis not present

## 2015-08-25 DIAGNOSIS — T8744 Infection of amputation stump, left lower extremity: Secondary | ICD-10-CM | POA: Diagnosis not present

## 2015-08-25 DIAGNOSIS — T8754 Necrosis of amputation stump, left lower extremity: Secondary | ICD-10-CM | POA: Diagnosis not present

## 2015-08-25 DIAGNOSIS — I129 Hypertensive chronic kidney disease with stage 1 through stage 4 chronic kidney disease, or unspecified chronic kidney disease: Secondary | ICD-10-CM | POA: Diagnosis not present

## 2015-08-25 DIAGNOSIS — T8743 Infection of amputation stump, right lower extremity: Secondary | ICD-10-CM | POA: Diagnosis not present

## 2015-08-25 DIAGNOSIS — E43 Unspecified severe protein-calorie malnutrition: Secondary | ICD-10-CM | POA: Diagnosis not present

## 2015-08-25 DIAGNOSIS — T8753 Necrosis of amputation stump, right lower extremity: Secondary | ICD-10-CM | POA: Diagnosis not present

## 2015-08-26 DIAGNOSIS — T8743 Infection of amputation stump, right lower extremity: Secondary | ICD-10-CM | POA: Diagnosis not present

## 2015-08-26 DIAGNOSIS — E43 Unspecified severe protein-calorie malnutrition: Secondary | ICD-10-CM | POA: Diagnosis not present

## 2015-08-26 DIAGNOSIS — T8754 Necrosis of amputation stump, left lower extremity: Secondary | ICD-10-CM | POA: Diagnosis not present

## 2015-08-26 DIAGNOSIS — T8744 Infection of amputation stump, left lower extremity: Secondary | ICD-10-CM | POA: Diagnosis not present

## 2015-08-26 DIAGNOSIS — T8753 Necrosis of amputation stump, right lower extremity: Secondary | ICD-10-CM | POA: Diagnosis not present

## 2015-08-26 DIAGNOSIS — I129 Hypertensive chronic kidney disease with stage 1 through stage 4 chronic kidney disease, or unspecified chronic kidney disease: Secondary | ICD-10-CM | POA: Diagnosis not present

## 2015-09-01 DIAGNOSIS — T8753 Necrosis of amputation stump, right lower extremity: Secondary | ICD-10-CM | POA: Diagnosis not present

## 2015-09-01 DIAGNOSIS — T8754 Necrosis of amputation stump, left lower extremity: Secondary | ICD-10-CM | POA: Diagnosis not present

## 2015-09-01 DIAGNOSIS — T8743 Infection of amputation stump, right lower extremity: Secondary | ICD-10-CM | POA: Diagnosis not present

## 2015-09-01 DIAGNOSIS — T8744 Infection of amputation stump, left lower extremity: Secondary | ICD-10-CM | POA: Diagnosis not present

## 2015-09-01 DIAGNOSIS — E43 Unspecified severe protein-calorie malnutrition: Secondary | ICD-10-CM | POA: Diagnosis not present

## 2015-09-01 DIAGNOSIS — I129 Hypertensive chronic kidney disease with stage 1 through stage 4 chronic kidney disease, or unspecified chronic kidney disease: Secondary | ICD-10-CM | POA: Diagnosis not present

## 2015-09-08 DIAGNOSIS — T8744 Infection of amputation stump, left lower extremity: Secondary | ICD-10-CM | POA: Diagnosis not present

## 2015-09-08 DIAGNOSIS — T8754 Necrosis of amputation stump, left lower extremity: Secondary | ICD-10-CM | POA: Diagnosis not present

## 2015-09-08 DIAGNOSIS — E43 Unspecified severe protein-calorie malnutrition: Secondary | ICD-10-CM | POA: Diagnosis not present

## 2015-09-08 DIAGNOSIS — I129 Hypertensive chronic kidney disease with stage 1 through stage 4 chronic kidney disease, or unspecified chronic kidney disease: Secondary | ICD-10-CM | POA: Diagnosis not present

## 2015-09-08 DIAGNOSIS — T8753 Necrosis of amputation stump, right lower extremity: Secondary | ICD-10-CM | POA: Diagnosis not present

## 2015-09-08 DIAGNOSIS — T8743 Infection of amputation stump, right lower extremity: Secondary | ICD-10-CM | POA: Diagnosis not present

## 2015-09-17 DIAGNOSIS — T8743 Infection of amputation stump, right lower extremity: Secondary | ICD-10-CM | POA: Diagnosis not present

## 2015-09-17 DIAGNOSIS — E43 Unspecified severe protein-calorie malnutrition: Secondary | ICD-10-CM | POA: Diagnosis not present

## 2015-09-17 DIAGNOSIS — T8754 Necrosis of amputation stump, left lower extremity: Secondary | ICD-10-CM | POA: Diagnosis not present

## 2015-09-17 DIAGNOSIS — I129 Hypertensive chronic kidney disease with stage 1 through stage 4 chronic kidney disease, or unspecified chronic kidney disease: Secondary | ICD-10-CM | POA: Diagnosis not present

## 2015-09-17 DIAGNOSIS — T8744 Infection of amputation stump, left lower extremity: Secondary | ICD-10-CM | POA: Diagnosis not present

## 2015-09-17 DIAGNOSIS — T8753 Necrosis of amputation stump, right lower extremity: Secondary | ICD-10-CM | POA: Diagnosis not present

## 2015-09-22 DIAGNOSIS — L0390 Cellulitis, unspecified: Secondary | ICD-10-CM | POA: Diagnosis not present

## 2015-09-23 DIAGNOSIS — T8753 Necrosis of amputation stump, right lower extremity: Secondary | ICD-10-CM | POA: Diagnosis not present

## 2015-09-23 DIAGNOSIS — T8754 Necrosis of amputation stump, left lower extremity: Secondary | ICD-10-CM | POA: Diagnosis not present

## 2015-09-23 DIAGNOSIS — T8744 Infection of amputation stump, left lower extremity: Secondary | ICD-10-CM | POA: Diagnosis not present

## 2015-09-23 DIAGNOSIS — E43 Unspecified severe protein-calorie malnutrition: Secondary | ICD-10-CM | POA: Diagnosis not present

## 2015-09-23 DIAGNOSIS — T8743 Infection of amputation stump, right lower extremity: Secondary | ICD-10-CM | POA: Diagnosis not present

## 2015-09-23 DIAGNOSIS — I129 Hypertensive chronic kidney disease with stage 1 through stage 4 chronic kidney disease, or unspecified chronic kidney disease: Secondary | ICD-10-CM | POA: Diagnosis not present

## 2015-09-27 DIAGNOSIS — E43 Unspecified severe protein-calorie malnutrition: Secondary | ICD-10-CM | POA: Diagnosis not present

## 2015-09-27 DIAGNOSIS — T8743 Infection of amputation stump, right lower extremity: Secondary | ICD-10-CM | POA: Diagnosis not present

## 2015-09-27 DIAGNOSIS — I129 Hypertensive chronic kidney disease with stage 1 through stage 4 chronic kidney disease, or unspecified chronic kidney disease: Secondary | ICD-10-CM | POA: Diagnosis not present

## 2015-09-27 DIAGNOSIS — T8753 Necrosis of amputation stump, right lower extremity: Secondary | ICD-10-CM | POA: Diagnosis not present

## 2015-09-27 DIAGNOSIS — T8754 Necrosis of amputation stump, left lower extremity: Secondary | ICD-10-CM | POA: Diagnosis not present

## 2015-09-27 DIAGNOSIS — T8744 Infection of amputation stump, left lower extremity: Secondary | ICD-10-CM | POA: Diagnosis not present

## 2015-09-30 DIAGNOSIS — I70263 Atherosclerosis of native arteries of extremities with gangrene, bilateral legs: Secondary | ICD-10-CM | POA: Diagnosis not present

## 2015-09-30 DIAGNOSIS — Z89421 Acquired absence of other right toe(s): Secondary | ICD-10-CM | POA: Diagnosis not present

## 2015-09-30 DIAGNOSIS — Z4800 Encounter for change or removal of nonsurgical wound dressing: Secondary | ICD-10-CM | POA: Diagnosis not present

## 2015-09-30 DIAGNOSIS — N183 Chronic kidney disease, stage 3 (moderate): Secondary | ICD-10-CM | POA: Diagnosis not present

## 2015-09-30 DIAGNOSIS — I129 Hypertensive chronic kidney disease with stage 1 through stage 4 chronic kidney disease, or unspecified chronic kidney disease: Secondary | ICD-10-CM | POA: Diagnosis not present

## 2015-09-30 DIAGNOSIS — E43 Unspecified severe protein-calorie malnutrition: Secondary | ICD-10-CM | POA: Diagnosis not present

## 2015-09-30 DIAGNOSIS — T8754 Necrosis of amputation stump, left lower extremity: Secondary | ICD-10-CM | POA: Diagnosis not present

## 2015-09-30 DIAGNOSIS — T8753 Necrosis of amputation stump, right lower extremity: Secondary | ICD-10-CM | POA: Diagnosis not present

## 2015-09-30 DIAGNOSIS — Z89412 Acquired absence of left great toe: Secondary | ICD-10-CM | POA: Diagnosis not present

## 2015-10-06 DIAGNOSIS — N183 Chronic kidney disease, stage 3 (moderate): Secondary | ICD-10-CM | POA: Diagnosis not present

## 2015-10-06 DIAGNOSIS — I70263 Atherosclerosis of native arteries of extremities with gangrene, bilateral legs: Secondary | ICD-10-CM | POA: Diagnosis not present

## 2015-10-06 DIAGNOSIS — E43 Unspecified severe protein-calorie malnutrition: Secondary | ICD-10-CM | POA: Diagnosis not present

## 2015-10-06 DIAGNOSIS — T8754 Necrosis of amputation stump, left lower extremity: Secondary | ICD-10-CM | POA: Diagnosis not present

## 2015-10-06 DIAGNOSIS — T8753 Necrosis of amputation stump, right lower extremity: Secondary | ICD-10-CM | POA: Diagnosis not present

## 2015-10-06 DIAGNOSIS — I129 Hypertensive chronic kidney disease with stage 1 through stage 4 chronic kidney disease, or unspecified chronic kidney disease: Secondary | ICD-10-CM | POA: Diagnosis not present

## 2015-10-07 DIAGNOSIS — T8754 Necrosis of amputation stump, left lower extremity: Secondary | ICD-10-CM | POA: Diagnosis not present

## 2015-10-07 DIAGNOSIS — N183 Chronic kidney disease, stage 3 (moderate): Secondary | ICD-10-CM | POA: Diagnosis not present

## 2015-10-07 DIAGNOSIS — E43 Unspecified severe protein-calorie malnutrition: Secondary | ICD-10-CM | POA: Diagnosis not present

## 2015-10-07 DIAGNOSIS — T8753 Necrosis of amputation stump, right lower extremity: Secondary | ICD-10-CM | POA: Diagnosis not present

## 2015-10-07 DIAGNOSIS — I70263 Atherosclerosis of native arteries of extremities with gangrene, bilateral legs: Secondary | ICD-10-CM | POA: Diagnosis not present

## 2015-10-07 DIAGNOSIS — I129 Hypertensive chronic kidney disease with stage 1 through stage 4 chronic kidney disease, or unspecified chronic kidney disease: Secondary | ICD-10-CM | POA: Diagnosis not present

## 2015-10-13 DIAGNOSIS — E43 Unspecified severe protein-calorie malnutrition: Secondary | ICD-10-CM | POA: Diagnosis not present

## 2015-10-13 DIAGNOSIS — T8753 Necrosis of amputation stump, right lower extremity: Secondary | ICD-10-CM | POA: Diagnosis not present

## 2015-10-13 DIAGNOSIS — I70263 Atherosclerosis of native arteries of extremities with gangrene, bilateral legs: Secondary | ICD-10-CM | POA: Diagnosis not present

## 2015-10-13 DIAGNOSIS — I129 Hypertensive chronic kidney disease with stage 1 through stage 4 chronic kidney disease, or unspecified chronic kidney disease: Secondary | ICD-10-CM | POA: Diagnosis not present

## 2015-10-13 DIAGNOSIS — N183 Chronic kidney disease, stage 3 (moderate): Secondary | ICD-10-CM | POA: Diagnosis not present

## 2015-10-13 DIAGNOSIS — T8754 Necrosis of amputation stump, left lower extremity: Secondary | ICD-10-CM | POA: Diagnosis not present

## 2015-10-20 DIAGNOSIS — N183 Chronic kidney disease, stage 3 (moderate): Secondary | ICD-10-CM | POA: Diagnosis not present

## 2015-10-20 DIAGNOSIS — T8753 Necrosis of amputation stump, right lower extremity: Secondary | ICD-10-CM | POA: Diagnosis not present

## 2015-10-20 DIAGNOSIS — I70263 Atherosclerosis of native arteries of extremities with gangrene, bilateral legs: Secondary | ICD-10-CM | POA: Diagnosis not present

## 2015-10-20 DIAGNOSIS — T8754 Necrosis of amputation stump, left lower extremity: Secondary | ICD-10-CM | POA: Diagnosis not present

## 2015-10-20 DIAGNOSIS — E43 Unspecified severe protein-calorie malnutrition: Secondary | ICD-10-CM | POA: Diagnosis not present

## 2015-10-20 DIAGNOSIS — I129 Hypertensive chronic kidney disease with stage 1 through stage 4 chronic kidney disease, or unspecified chronic kidney disease: Secondary | ICD-10-CM | POA: Diagnosis not present

## 2015-10-27 DIAGNOSIS — T8753 Necrosis of amputation stump, right lower extremity: Secondary | ICD-10-CM | POA: Diagnosis not present

## 2015-10-27 DIAGNOSIS — N183 Chronic kidney disease, stage 3 (moderate): Secondary | ICD-10-CM | POA: Diagnosis not present

## 2015-10-27 DIAGNOSIS — T8754 Necrosis of amputation stump, left lower extremity: Secondary | ICD-10-CM | POA: Diagnosis not present

## 2015-10-27 DIAGNOSIS — E43 Unspecified severe protein-calorie malnutrition: Secondary | ICD-10-CM | POA: Diagnosis not present

## 2015-10-27 DIAGNOSIS — I129 Hypertensive chronic kidney disease with stage 1 through stage 4 chronic kidney disease, or unspecified chronic kidney disease: Secondary | ICD-10-CM | POA: Diagnosis not present

## 2015-10-27 DIAGNOSIS — I70263 Atherosclerosis of native arteries of extremities with gangrene, bilateral legs: Secondary | ICD-10-CM | POA: Diagnosis not present

## 2015-11-03 DIAGNOSIS — E43 Unspecified severe protein-calorie malnutrition: Secondary | ICD-10-CM | POA: Diagnosis not present

## 2015-11-03 DIAGNOSIS — T8753 Necrosis of amputation stump, right lower extremity: Secondary | ICD-10-CM | POA: Diagnosis not present

## 2015-11-03 DIAGNOSIS — I129 Hypertensive chronic kidney disease with stage 1 through stage 4 chronic kidney disease, or unspecified chronic kidney disease: Secondary | ICD-10-CM | POA: Diagnosis not present

## 2015-11-03 DIAGNOSIS — N183 Chronic kidney disease, stage 3 (moderate): Secondary | ICD-10-CM | POA: Diagnosis not present

## 2015-11-03 DIAGNOSIS — T8754 Necrosis of amputation stump, left lower extremity: Secondary | ICD-10-CM | POA: Diagnosis not present

## 2015-11-03 DIAGNOSIS — I70263 Atherosclerosis of native arteries of extremities with gangrene, bilateral legs: Secondary | ICD-10-CM | POA: Diagnosis not present

## 2015-11-10 DIAGNOSIS — I129 Hypertensive chronic kidney disease with stage 1 through stage 4 chronic kidney disease, or unspecified chronic kidney disease: Secondary | ICD-10-CM | POA: Diagnosis not present

## 2015-11-10 DIAGNOSIS — N183 Chronic kidney disease, stage 3 (moderate): Secondary | ICD-10-CM | POA: Diagnosis not present

## 2015-11-10 DIAGNOSIS — I70263 Atherosclerosis of native arteries of extremities with gangrene, bilateral legs: Secondary | ICD-10-CM | POA: Diagnosis not present

## 2015-11-10 DIAGNOSIS — T8754 Necrosis of amputation stump, left lower extremity: Secondary | ICD-10-CM | POA: Diagnosis not present

## 2015-11-10 DIAGNOSIS — I5043 Acute on chronic combined systolic (congestive) and diastolic (congestive) heart failure: Secondary | ICD-10-CM | POA: Diagnosis not present

## 2015-11-10 DIAGNOSIS — E43 Unspecified severe protein-calorie malnutrition: Secondary | ICD-10-CM | POA: Diagnosis not present

## 2015-11-10 DIAGNOSIS — T8753 Necrosis of amputation stump, right lower extremity: Secondary | ICD-10-CM | POA: Diagnosis not present

## 2015-11-17 DIAGNOSIS — T8753 Necrosis of amputation stump, right lower extremity: Secondary | ICD-10-CM | POA: Diagnosis not present

## 2015-11-17 DIAGNOSIS — I129 Hypertensive chronic kidney disease with stage 1 through stage 4 chronic kidney disease, or unspecified chronic kidney disease: Secondary | ICD-10-CM | POA: Diagnosis not present

## 2015-11-17 DIAGNOSIS — N183 Chronic kidney disease, stage 3 (moderate): Secondary | ICD-10-CM | POA: Diagnosis not present

## 2015-11-17 DIAGNOSIS — E43 Unspecified severe protein-calorie malnutrition: Secondary | ICD-10-CM | POA: Diagnosis not present

## 2015-11-17 DIAGNOSIS — T8754 Necrosis of amputation stump, left lower extremity: Secondary | ICD-10-CM | POA: Diagnosis not present

## 2015-11-17 DIAGNOSIS — I70263 Atherosclerosis of native arteries of extremities with gangrene, bilateral legs: Secondary | ICD-10-CM | POA: Diagnosis not present

## 2015-11-22 DIAGNOSIS — T8754 Necrosis of amputation stump, left lower extremity: Secondary | ICD-10-CM | POA: Diagnosis not present

## 2015-11-22 DIAGNOSIS — I129 Hypertensive chronic kidney disease with stage 1 through stage 4 chronic kidney disease, or unspecified chronic kidney disease: Secondary | ICD-10-CM | POA: Diagnosis not present

## 2015-11-22 DIAGNOSIS — I70263 Atherosclerosis of native arteries of extremities with gangrene, bilateral legs: Secondary | ICD-10-CM | POA: Diagnosis not present

## 2015-11-22 DIAGNOSIS — T8753 Necrosis of amputation stump, right lower extremity: Secondary | ICD-10-CM | POA: Diagnosis not present

## 2015-11-22 DIAGNOSIS — E43 Unspecified severe protein-calorie malnutrition: Secondary | ICD-10-CM | POA: Diagnosis not present

## 2015-11-22 DIAGNOSIS — N183 Chronic kidney disease, stage 3 (moderate): Secondary | ICD-10-CM | POA: Diagnosis not present

## 2015-11-26 DIAGNOSIS — E43 Unspecified severe protein-calorie malnutrition: Secondary | ICD-10-CM | POA: Diagnosis not present

## 2015-11-26 DIAGNOSIS — T8753 Necrosis of amputation stump, right lower extremity: Secondary | ICD-10-CM | POA: Diagnosis not present

## 2015-11-26 DIAGNOSIS — T8754 Necrosis of amputation stump, left lower extremity: Secondary | ICD-10-CM | POA: Diagnosis not present

## 2015-11-26 DIAGNOSIS — N183 Chronic kidney disease, stage 3 (moderate): Secondary | ICD-10-CM | POA: Diagnosis not present

## 2015-11-26 DIAGNOSIS — I70263 Atherosclerosis of native arteries of extremities with gangrene, bilateral legs: Secondary | ICD-10-CM | POA: Diagnosis not present

## 2015-11-26 DIAGNOSIS — I129 Hypertensive chronic kidney disease with stage 1 through stage 4 chronic kidney disease, or unspecified chronic kidney disease: Secondary | ICD-10-CM | POA: Diagnosis not present

## 2015-11-29 DIAGNOSIS — Z4800 Encounter for change or removal of nonsurgical wound dressing: Secondary | ICD-10-CM | POA: Diagnosis not present

## 2015-11-29 DIAGNOSIS — Z89412 Acquired absence of left great toe: Secondary | ICD-10-CM | POA: Diagnosis not present

## 2015-11-29 DIAGNOSIS — N183 Chronic kidney disease, stage 3 (moderate): Secondary | ICD-10-CM | POA: Diagnosis not present

## 2015-11-29 DIAGNOSIS — I129 Hypertensive chronic kidney disease with stage 1 through stage 4 chronic kidney disease, or unspecified chronic kidney disease: Secondary | ICD-10-CM | POA: Diagnosis not present

## 2015-11-29 DIAGNOSIS — I70263 Atherosclerosis of native arteries of extremities with gangrene, bilateral legs: Secondary | ICD-10-CM | POA: Diagnosis not present

## 2015-11-29 DIAGNOSIS — T8753 Necrosis of amputation stump, right lower extremity: Secondary | ICD-10-CM | POA: Diagnosis not present

## 2015-11-29 DIAGNOSIS — E43 Unspecified severe protein-calorie malnutrition: Secondary | ICD-10-CM | POA: Diagnosis not present

## 2015-11-29 DIAGNOSIS — Z89421 Acquired absence of other right toe(s): Secondary | ICD-10-CM | POA: Diagnosis not present

## 2015-11-29 DIAGNOSIS — T8754 Necrosis of amputation stump, left lower extremity: Secondary | ICD-10-CM | POA: Diagnosis not present

## 2015-12-01 DIAGNOSIS — I70263 Atherosclerosis of native arteries of extremities with gangrene, bilateral legs: Secondary | ICD-10-CM | POA: Diagnosis not present

## 2015-12-01 DIAGNOSIS — T8754 Necrosis of amputation stump, left lower extremity: Secondary | ICD-10-CM | POA: Diagnosis not present

## 2015-12-01 DIAGNOSIS — E43 Unspecified severe protein-calorie malnutrition: Secondary | ICD-10-CM | POA: Diagnosis not present

## 2015-12-01 DIAGNOSIS — T8753 Necrosis of amputation stump, right lower extremity: Secondary | ICD-10-CM | POA: Diagnosis not present

## 2015-12-01 DIAGNOSIS — N183 Chronic kidney disease, stage 3 (moderate): Secondary | ICD-10-CM | POA: Diagnosis not present

## 2015-12-01 DIAGNOSIS — I129 Hypertensive chronic kidney disease with stage 1 through stage 4 chronic kidney disease, or unspecified chronic kidney disease: Secondary | ICD-10-CM | POA: Diagnosis not present

## 2015-12-08 DIAGNOSIS — I70263 Atherosclerosis of native arteries of extremities with gangrene, bilateral legs: Secondary | ICD-10-CM | POA: Diagnosis not present

## 2015-12-08 DIAGNOSIS — T8753 Necrosis of amputation stump, right lower extremity: Secondary | ICD-10-CM | POA: Diagnosis not present

## 2015-12-08 DIAGNOSIS — T8754 Necrosis of amputation stump, left lower extremity: Secondary | ICD-10-CM | POA: Diagnosis not present

## 2015-12-08 DIAGNOSIS — N183 Chronic kidney disease, stage 3 (moderate): Secondary | ICD-10-CM | POA: Diagnosis not present

## 2015-12-08 DIAGNOSIS — I129 Hypertensive chronic kidney disease with stage 1 through stage 4 chronic kidney disease, or unspecified chronic kidney disease: Secondary | ICD-10-CM | POA: Diagnosis not present

## 2015-12-08 DIAGNOSIS — E43 Unspecified severe protein-calorie malnutrition: Secondary | ICD-10-CM | POA: Diagnosis not present

## 2015-12-13 DIAGNOSIS — I129 Hypertensive chronic kidney disease with stage 1 through stage 4 chronic kidney disease, or unspecified chronic kidney disease: Secondary | ICD-10-CM | POA: Diagnosis not present

## 2015-12-13 DIAGNOSIS — T8754 Necrosis of amputation stump, left lower extremity: Secondary | ICD-10-CM | POA: Diagnosis not present

## 2015-12-13 DIAGNOSIS — I70263 Atherosclerosis of native arteries of extremities with gangrene, bilateral legs: Secondary | ICD-10-CM | POA: Diagnosis not present

## 2015-12-13 DIAGNOSIS — T8753 Necrosis of amputation stump, right lower extremity: Secondary | ICD-10-CM | POA: Diagnosis not present

## 2015-12-13 DIAGNOSIS — N183 Chronic kidney disease, stage 3 (moderate): Secondary | ICD-10-CM | POA: Diagnosis not present

## 2015-12-13 DIAGNOSIS — E43 Unspecified severe protein-calorie malnutrition: Secondary | ICD-10-CM | POA: Diagnosis not present

## 2015-12-15 DIAGNOSIS — E43 Unspecified severe protein-calorie malnutrition: Secondary | ICD-10-CM | POA: Diagnosis not present

## 2015-12-15 DIAGNOSIS — T8753 Necrosis of amputation stump, right lower extremity: Secondary | ICD-10-CM | POA: Diagnosis not present

## 2015-12-15 DIAGNOSIS — I129 Hypertensive chronic kidney disease with stage 1 through stage 4 chronic kidney disease, or unspecified chronic kidney disease: Secondary | ICD-10-CM | POA: Diagnosis not present

## 2015-12-15 DIAGNOSIS — T8754 Necrosis of amputation stump, left lower extremity: Secondary | ICD-10-CM | POA: Diagnosis not present

## 2015-12-15 DIAGNOSIS — I70263 Atherosclerosis of native arteries of extremities with gangrene, bilateral legs: Secondary | ICD-10-CM | POA: Diagnosis not present

## 2015-12-15 DIAGNOSIS — N183 Chronic kidney disease, stage 3 (moderate): Secondary | ICD-10-CM | POA: Diagnosis not present

## 2015-12-22 DIAGNOSIS — I129 Hypertensive chronic kidney disease with stage 1 through stage 4 chronic kidney disease, or unspecified chronic kidney disease: Secondary | ICD-10-CM | POA: Diagnosis not present

## 2015-12-22 DIAGNOSIS — N183 Chronic kidney disease, stage 3 (moderate): Secondary | ICD-10-CM | POA: Diagnosis not present

## 2015-12-22 DIAGNOSIS — T8754 Necrosis of amputation stump, left lower extremity: Secondary | ICD-10-CM | POA: Diagnosis not present

## 2015-12-22 DIAGNOSIS — I70263 Atherosclerosis of native arteries of extremities with gangrene, bilateral legs: Secondary | ICD-10-CM | POA: Diagnosis not present

## 2015-12-22 DIAGNOSIS — E43 Unspecified severe protein-calorie malnutrition: Secondary | ICD-10-CM | POA: Diagnosis not present

## 2015-12-22 DIAGNOSIS — T8753 Necrosis of amputation stump, right lower extremity: Secondary | ICD-10-CM | POA: Diagnosis not present

## 2015-12-29 DIAGNOSIS — T8754 Necrosis of amputation stump, left lower extremity: Secondary | ICD-10-CM | POA: Diagnosis not present

## 2015-12-29 DIAGNOSIS — I129 Hypertensive chronic kidney disease with stage 1 through stage 4 chronic kidney disease, or unspecified chronic kidney disease: Secondary | ICD-10-CM | POA: Diagnosis not present

## 2015-12-29 DIAGNOSIS — I70263 Atherosclerosis of native arteries of extremities with gangrene, bilateral legs: Secondary | ICD-10-CM | POA: Diagnosis not present

## 2015-12-29 DIAGNOSIS — T8753 Necrosis of amputation stump, right lower extremity: Secondary | ICD-10-CM | POA: Diagnosis not present

## 2015-12-29 DIAGNOSIS — E43 Unspecified severe protein-calorie malnutrition: Secondary | ICD-10-CM | POA: Diagnosis not present

## 2015-12-29 DIAGNOSIS — N183 Chronic kidney disease, stage 3 (moderate): Secondary | ICD-10-CM | POA: Diagnosis not present

## 2016-01-05 DIAGNOSIS — N183 Chronic kidney disease, stage 3 (moderate): Secondary | ICD-10-CM | POA: Diagnosis not present

## 2016-01-05 DIAGNOSIS — I70263 Atherosclerosis of native arteries of extremities with gangrene, bilateral legs: Secondary | ICD-10-CM | POA: Diagnosis not present

## 2016-01-05 DIAGNOSIS — T8754 Necrosis of amputation stump, left lower extremity: Secondary | ICD-10-CM | POA: Diagnosis not present

## 2016-01-05 DIAGNOSIS — T8753 Necrosis of amputation stump, right lower extremity: Secondary | ICD-10-CM | POA: Diagnosis not present

## 2016-01-05 DIAGNOSIS — E43 Unspecified severe protein-calorie malnutrition: Secondary | ICD-10-CM | POA: Diagnosis not present

## 2016-01-05 DIAGNOSIS — I129 Hypertensive chronic kidney disease with stage 1 through stage 4 chronic kidney disease, or unspecified chronic kidney disease: Secondary | ICD-10-CM | POA: Diagnosis not present

## 2016-01-12 DIAGNOSIS — I70263 Atherosclerosis of native arteries of extremities with gangrene, bilateral legs: Secondary | ICD-10-CM | POA: Diagnosis not present

## 2016-01-12 DIAGNOSIS — T8754 Necrosis of amputation stump, left lower extremity: Secondary | ICD-10-CM | POA: Diagnosis not present

## 2016-01-12 DIAGNOSIS — T8753 Necrosis of amputation stump, right lower extremity: Secondary | ICD-10-CM | POA: Diagnosis not present

## 2016-01-12 DIAGNOSIS — I129 Hypertensive chronic kidney disease with stage 1 through stage 4 chronic kidney disease, or unspecified chronic kidney disease: Secondary | ICD-10-CM | POA: Diagnosis not present

## 2016-01-12 DIAGNOSIS — N183 Chronic kidney disease, stage 3 (moderate): Secondary | ICD-10-CM | POA: Diagnosis not present

## 2016-01-12 DIAGNOSIS — E43 Unspecified severe protein-calorie malnutrition: Secondary | ICD-10-CM | POA: Diagnosis not present

## 2016-01-19 DIAGNOSIS — T8754 Necrosis of amputation stump, left lower extremity: Secondary | ICD-10-CM | POA: Diagnosis not present

## 2016-01-19 DIAGNOSIS — I129 Hypertensive chronic kidney disease with stage 1 through stage 4 chronic kidney disease, or unspecified chronic kidney disease: Secondary | ICD-10-CM | POA: Diagnosis not present

## 2016-01-19 DIAGNOSIS — I70263 Atherosclerosis of native arteries of extremities with gangrene, bilateral legs: Secondary | ICD-10-CM | POA: Diagnosis not present

## 2016-01-19 DIAGNOSIS — T8753 Necrosis of amputation stump, right lower extremity: Secondary | ICD-10-CM | POA: Diagnosis not present

## 2016-01-19 DIAGNOSIS — N183 Chronic kidney disease, stage 3 (moderate): Secondary | ICD-10-CM | POA: Diagnosis not present

## 2016-01-19 DIAGNOSIS — E43 Unspecified severe protein-calorie malnutrition: Secondary | ICD-10-CM | POA: Diagnosis not present

## 2016-01-26 DIAGNOSIS — E43 Unspecified severe protein-calorie malnutrition: Secondary | ICD-10-CM | POA: Diagnosis not present

## 2016-01-26 DIAGNOSIS — I129 Hypertensive chronic kidney disease with stage 1 through stage 4 chronic kidney disease, or unspecified chronic kidney disease: Secondary | ICD-10-CM | POA: Diagnosis not present

## 2016-01-26 DIAGNOSIS — T8753 Necrosis of amputation stump, right lower extremity: Secondary | ICD-10-CM | POA: Diagnosis not present

## 2016-01-26 DIAGNOSIS — T8754 Necrosis of amputation stump, left lower extremity: Secondary | ICD-10-CM | POA: Diagnosis not present

## 2016-01-26 DIAGNOSIS — I70263 Atherosclerosis of native arteries of extremities with gangrene, bilateral legs: Secondary | ICD-10-CM | POA: Diagnosis not present

## 2016-01-26 DIAGNOSIS — N183 Chronic kidney disease, stage 3 (moderate): Secondary | ICD-10-CM | POA: Diagnosis not present

## 2016-02-02 ENCOUNTER — Emergency Department (HOSPITAL_COMMUNITY): Payer: Medicare Other

## 2016-02-02 ENCOUNTER — Inpatient Hospital Stay (HOSPITAL_COMMUNITY)
Admission: EM | Admit: 2016-02-02 | Discharge: 2016-02-08 | DRG: 871 | Disposition: A | Discharge status: Home Health | Payer: Medicare Other | Attending: Student in an Organized Health Care Education/Training Program | Admitting: Student in an Organized Health Care Education/Training Program

## 2016-02-02 ENCOUNTER — Encounter (HOSPITAL_COMMUNITY): Payer: Self-pay | Admitting: *Deleted

## 2016-02-02 DIAGNOSIS — I10 Essential (primary) hypertension: Secondary | ICD-10-CM | POA: Diagnosis present

## 2016-02-02 DIAGNOSIS — E860 Dehydration: Secondary | ICD-10-CM | POA: Diagnosis present

## 2016-02-02 DIAGNOSIS — L97419 Non-pressure chronic ulcer of right heel and midfoot with unspecified severity: Secondary | ICD-10-CM | POA: Diagnosis present

## 2016-02-02 DIAGNOSIS — M1040 Other secondary gout, unspecified site: Secondary | ICD-10-CM | POA: Diagnosis present

## 2016-02-02 DIAGNOSIS — L97929 Non-pressure chronic ulcer of unspecified part of left lower leg with unspecified severity: Secondary | ICD-10-CM | POA: Diagnosis not present

## 2016-02-02 DIAGNOSIS — I96 Gangrene, not elsewhere classified: Secondary | ICD-10-CM | POA: Diagnosis present

## 2016-02-02 DIAGNOSIS — Z452 Encounter for adjustment and management of vascular access device: Secondary | ICD-10-CM | POA: Diagnosis not present

## 2016-02-02 DIAGNOSIS — D638 Anemia in other chronic diseases classified elsewhere: Secondary | ICD-10-CM | POA: Diagnosis present

## 2016-02-02 DIAGNOSIS — M86672 Other chronic osteomyelitis, left ankle and foot: Secondary | ICD-10-CM | POA: Diagnosis not present

## 2016-02-02 DIAGNOSIS — N183 Chronic kidney disease, stage 3 unspecified: Secondary | ICD-10-CM | POA: Diagnosis present

## 2016-02-02 DIAGNOSIS — D631 Anemia in chronic kidney disease: Secondary | ICD-10-CM | POA: Diagnosis present

## 2016-02-02 DIAGNOSIS — M86272 Subacute osteomyelitis, left ankle and foot: Secondary | ICD-10-CM | POA: Diagnosis present

## 2016-02-02 DIAGNOSIS — M86271 Subacute osteomyelitis, right ankle and foot: Secondary | ICD-10-CM | POA: Diagnosis not present

## 2016-02-02 DIAGNOSIS — E870 Hyperosmolality and hypernatremia: Secondary | ICD-10-CM | POA: Diagnosis present

## 2016-02-02 DIAGNOSIS — A419 Sepsis, unspecified organism: Principal | ICD-10-CM | POA: Diagnosis present

## 2016-02-02 DIAGNOSIS — M109 Gout, unspecified: Secondary | ICD-10-CM | POA: Diagnosis present

## 2016-02-02 DIAGNOSIS — Z5329 Procedure and treatment not carried out because of patient's decision for other reasons: Secondary | ICD-10-CM | POA: Diagnosis not present

## 2016-02-02 DIAGNOSIS — E872 Acidosis: Secondary | ICD-10-CM | POA: Diagnosis present

## 2016-02-02 DIAGNOSIS — R7989 Other specified abnormal findings of blood chemistry: Secondary | ICD-10-CM

## 2016-02-02 DIAGNOSIS — L97529 Non-pressure chronic ulcer of other part of left foot with unspecified severity: Secondary | ICD-10-CM | POA: Diagnosis not present

## 2016-02-02 DIAGNOSIS — L97519 Non-pressure chronic ulcer of other part of right foot with unspecified severity: Secondary | ICD-10-CM | POA: Diagnosis not present

## 2016-02-02 DIAGNOSIS — N189 Chronic kidney disease, unspecified: Secondary | ICD-10-CM | POA: Diagnosis not present

## 2016-02-02 DIAGNOSIS — L97824 Non-pressure chronic ulcer of other part of left lower leg with necrosis of bone: Secondary | ICD-10-CM | POA: Diagnosis not present

## 2016-02-02 DIAGNOSIS — M8620 Subacute osteomyelitis, unspecified site: Secondary | ICD-10-CM | POA: Diagnosis not present

## 2016-02-02 DIAGNOSIS — E11621 Type 2 diabetes mellitus with foot ulcer: Secondary | ICD-10-CM | POA: Diagnosis not present

## 2016-02-02 DIAGNOSIS — I129 Hypertensive chronic kidney disease with stage 1 through stage 4 chronic kidney disease, or unspecified chronic kidney disease: Secondary | ICD-10-CM | POA: Diagnosis present

## 2016-02-02 DIAGNOSIS — B9689 Other specified bacterial agents as the cause of diseases classified elsewhere: Secondary | ICD-10-CM | POA: Diagnosis not present

## 2016-02-02 DIAGNOSIS — E43 Unspecified severe protein-calorie malnutrition: Secondary | ICD-10-CM | POA: Diagnosis present

## 2016-02-02 DIAGNOSIS — M86279 Subacute osteomyelitis, unspecified ankle and foot: Secondary | ICD-10-CM

## 2016-02-02 DIAGNOSIS — L03116 Cellulitis of left lower limb: Secondary | ICD-10-CM | POA: Diagnosis not present

## 2016-02-02 DIAGNOSIS — L97823 Non-pressure chronic ulcer of other part of left lower leg with necrosis of muscle: Secondary | ICD-10-CM | POA: Diagnosis present

## 2016-02-02 DIAGNOSIS — R918 Other nonspecific abnormal finding of lung field: Secondary | ICD-10-CM | POA: Diagnosis not present

## 2016-02-02 DIAGNOSIS — S91302A Unspecified open wound, left foot, initial encounter: Secondary | ICD-10-CM | POA: Diagnosis not present

## 2016-02-02 DIAGNOSIS — R740 Nonspecific elevation of levels of transaminase and lactic acid dehydrogenase [LDH]: Secondary | ICD-10-CM | POA: Diagnosis not present

## 2016-02-02 DIAGNOSIS — L0889 Other specified local infections of the skin and subcutaneous tissue: Secondary | ICD-10-CM | POA: Diagnosis not present

## 2016-02-02 HISTORY — DX: Personal history of other medical treatment: Z92.89

## 2016-02-02 LAB — CBC WITH DIFFERENTIAL/PLATELET
BASOS ABS: 0 10*3/uL (ref 0.0–0.1)
Basophils Relative: 0 %
Eosinophils Absolute: 0.1 10*3/uL (ref 0.0–0.7)
Eosinophils Relative: 0 %
HEMATOCRIT: 24.9 % — AB (ref 39.0–52.0)
Hemoglobin: 7.8 g/dL — ABNORMAL LOW (ref 13.0–17.0)
LYMPHS PCT: 3 %
Lymphs Abs: 0.7 10*3/uL (ref 0.7–4.0)
MCH: 26.1 pg (ref 26.0–34.0)
MCHC: 31.3 g/dL (ref 30.0–36.0)
MCV: 83.3 fL (ref 78.0–100.0)
MONO ABS: 1.7 10*3/uL — AB (ref 0.1–1.0)
MONOS PCT: 7 %
NEUTROS ABS: 21.7 10*3/uL — AB (ref 1.7–7.7)
Neutrophils Relative %: 90 %
Platelets: 330 10*3/uL (ref 150–400)
RBC: 2.99 MIL/uL — ABNORMAL LOW (ref 4.22–5.81)
RDW: 14.9 % (ref 11.5–15.5)
WBC: 24.1 10*3/uL — ABNORMAL HIGH (ref 4.0–10.5)

## 2016-02-02 LAB — MRSA PCR SCREENING: MRSA BY PCR: NEGATIVE

## 2016-02-02 LAB — COMPREHENSIVE METABOLIC PANEL
ALT: 10 U/L — AB (ref 17–63)
AST: 15 U/L (ref 15–41)
Albumin: 1.5 g/dL — ABNORMAL LOW (ref 3.5–5.0)
Alkaline Phosphatase: 62 U/L (ref 38–126)
Anion gap: 9 (ref 5–15)
BILIRUBIN TOTAL: 0.3 mg/dL (ref 0.3–1.2)
BUN: 31 mg/dL — AB (ref 6–20)
CO2: 22 mmol/L (ref 22–32)
CREATININE: 1.77 mg/dL — AB (ref 0.61–1.24)
Calcium: 8.4 mg/dL — ABNORMAL LOW (ref 8.9–10.3)
Chloride: 118 mmol/L — ABNORMAL HIGH (ref 101–111)
GFR calc Af Amer: 37 mL/min — ABNORMAL LOW (ref >60)
GFR, EST NON AFRICAN AMERICAN: 32 mL/min — AB (ref >60)
Glucose, Bld: 101 mg/dL — ABNORMAL HIGH (ref 65–99)
Potassium: 3.2 mmol/L — ABNORMAL LOW (ref 3.5–5.1)
Sodium: 149 mmol/L — ABNORMAL HIGH (ref 135–145)
TOTAL PROTEIN: 6.4 g/dL — AB (ref 6.5–8.1)

## 2016-02-02 LAB — URINALYSIS, ROUTINE W REFLEX MICROSCOPIC
BILIRUBIN URINE: NEGATIVE
Glucose, UA: NEGATIVE mg/dL
Ketones, ur: NEGATIVE mg/dL
Leukocytes, UA: NEGATIVE
Nitrite: NEGATIVE
PH: 6 (ref 5.0–8.0)
Protein, ur: 30 mg/dL — AB
Specific Gravity, Urine: 1.013 (ref 1.005–1.030)

## 2016-02-02 LAB — C-REACTIVE PROTEIN: CRP: 19.7 mg/dL — ABNORMAL HIGH (ref <1.0)

## 2016-02-02 LAB — URINE MICROSCOPIC-ADD ON: BACTERIA UA: NONE SEEN

## 2016-02-02 LAB — I-STAT CG4 LACTIC ACID, ED
LACTIC ACID, VENOUS: 3.94 mmol/L — AB (ref 0.5–2.0)
Lactic Acid, Venous: 2.27 mmol/L (ref 0.5–2.0)

## 2016-02-02 MED ORDER — PIPERACILLIN-TAZOBACTAM 3.375 G IVPB
3.3750 g | Freq: Three times a day (TID) | INTRAVENOUS | Status: DC
  Administered 2016-02-02 – 2016-02-08 (×17): 3.375 g via INTRAVENOUS
  Filled 2016-02-02 (×20): qty 50

## 2016-02-02 MED ORDER — ONDANSETRON HCL 4 MG PO TABS: 4.0000 mg | ORAL_TABLET | Freq: Four times a day (QID) | ORAL | Status: DC | PRN

## 2016-02-02 MED ORDER — ENOXAPARIN SODIUM 30 MG/0.3ML ~~LOC~~ SOLN
30.0000 mg | SUBCUTANEOUS | Status: DC
  Administered 2016-02-02 – 2016-02-06 (×5): 30 mg via SUBCUTANEOUS
  Filled 2016-02-02 (×8): qty 0.3

## 2016-02-02 MED ORDER — ALLOPURINOL 100 MG PO TABS
100.0000 mg | ORAL_TABLET | Freq: Two times a day (BID) | ORAL | Status: DC
  Administered 2016-02-03 – 2016-02-08 (×10): 100 mg via ORAL
  Filled 2016-02-02 (×11): qty 1

## 2016-02-02 MED ORDER — ACETAMINOPHEN 325 MG PO TABS
650.0000 mg | ORAL_TABLET | Freq: Four times a day (QID) | ORAL | Status: DC | PRN
  Administered 2016-02-04 – 2016-02-08 (×3): 650 mg via ORAL
  Filled 2016-02-02 (×5): qty 2

## 2016-02-02 MED ORDER — SODIUM CHLORIDE 0.9 % IV BOLUS (SEPSIS)
1000.0000 mL | INTRAVENOUS | Status: AC
  Administered 2016-02-02 (×2): 1000 mL via INTRAVENOUS

## 2016-02-02 MED ORDER — VANCOMYCIN HCL IN DEXTROSE 1-5 GM/200ML-% IV SOLN
1000.0000 mg | Freq: Once | INTRAVENOUS | Status: DC
  Filled 2016-02-02: qty 200

## 2016-02-02 MED ORDER — ACETAMINOPHEN 650 MG RE SUPP: 650.0000 mg | Freq: Four times a day (QID) | RECTAL | Status: DC | PRN

## 2016-02-02 MED ORDER — POTASSIUM CHLORIDE CRYS ER 20 MEQ PO TBCR
40.0000 meq | EXTENDED_RELEASE_TABLET | Freq: Once | ORAL | Status: AC
  Administered 2016-02-02: 40 meq via ORAL
  Filled 2016-02-02: qty 2

## 2016-02-02 MED ORDER — SODIUM CHLORIDE 0.9 % IV BOLUS (SEPSIS)
1000.0000 mL | Freq: Once | INTRAVENOUS | Status: AC
  Administered 2016-02-02: 1000 mL via INTRAVENOUS

## 2016-02-02 MED ORDER — PIPERACILLIN-TAZOBACTAM 3.375 G IVPB 30 MIN
3.3750 g | Freq: Once | INTRAVENOUS | Status: AC
  Administered 2016-02-02: 3.375 g via INTRAVENOUS
  Filled 2016-02-02: qty 50

## 2016-02-02 MED ORDER — VANCOMYCIN HCL IN DEXTROSE 750-5 MG/150ML-% IV SOLN
750.0000 mg | INTRAVENOUS | Status: DC
  Administered 2016-02-03 – 2016-02-08 (×6): 750 mg via INTRAVENOUS
  Filled 2016-02-02 (×6): qty 150

## 2016-02-02 MED ORDER — ONDANSETRON HCL 4 MG/2ML IJ SOLN: 4.0000 mg | Freq: Once | INTRAMUSCULAR | Status: DC

## 2016-02-02 MED ORDER — VANCOMYCIN HCL 10 G IV SOLR
1500.0000 mg | Freq: Once | INTRAVENOUS | Status: AC
  Administered 2016-02-02: 1500 mg via INTRAVENOUS
  Filled 2016-02-02: qty 1500

## 2016-02-02 MED ORDER — SENNOSIDES-DOCUSATE SODIUM 8.6-50 MG PO TABS
1.0000 | ORAL_TABLET | Freq: Every evening | ORAL | Status: DC | PRN
  Filled 2016-02-02: qty 1

## 2016-02-02 MED ORDER — SODIUM CHLORIDE 0.9 % IV SOLN
INTRAVENOUS | Status: AC
  Administered 2016-02-02: 19:00:00 via INTRAVENOUS

## 2016-02-02 MED ORDER — ONDANSETRON HCL 4 MG/2ML IJ SOLN: 4.0000 mg | Freq: Four times a day (QID) | INTRAMUSCULAR | Status: DC | PRN

## 2016-02-02 NOTE — Progress Notes (Addendum)
Pharmacy Antibiotic Note  Vincent Miranda is a 82 y.o. male admitted on 02/02/2016 with cellulitis.  Pharmacy has been consulted for vanc/zosyn dosing. Afeb, LA 3.94, SCr 1.77 on admit, CrCl~30-35 based on estimated wt in the ED. Per patient in the ED, weight ~170-180 lbs.  Plan: Vanc 1500mg  IV x1; then 750mg  IV q24h Zosyn 30min inf x1; then 3.375g IV q8h (4h inf) Monitor clinical progress, c/s, renal function, abx plan/LOT VT@SS  as indicated     Temp (24hrs), Avg:98.5 F (36.9 C), Min:98.5 F (36.9 C), Max:98.5 F (36.9 C)   Recent Labs Lab 02/02/16 1140  LATICACIDVEN 3.94*    CrCl cannot be calculated (Unknown ideal weight.).    No Known Allergies  Antimicrobials this admission: 3/15 vanc >> 3/15 zosyn >>   Dose adjustments this admission: n/a  Microbiology results: 3/15 BCx:  3/15 UCx:    Babs BertinHaley Charlis Harner, PharmD, BCPS Clinical Pharmacist Pager [phone] 02/02/2016 12:53 PM

## 2016-02-02 NOTE — ED Notes (Signed)
Pt reports wound to left foot for months, drainage and foul odor noted at triage.

## 2016-02-02 NOTE — ED Notes (Signed)
Report attempted 

## 2016-02-02 NOTE — H&P (Signed)
Date: 02/02/2016               Patient Name:  Vincent Miranda MRN: [id number]  DOB: September 09, 1925 Age / Sex: 82 y.o., male   PCP: Elizabeth Palau, MD         Medical Service: Internal Medicine Teaching Service         Attending Physician: Dr. Axel Filler, MD    First Contact: Dr. Benjamine Mola Pager: [phone]  Second Contact: Dr. Hulen Luster Pager: [phone]       After Hours (After 5p/  First Contact Pager: [phone]  weekends / holidays): Second Contact Pager: [phone]   Chief Complaint: Left leg ulcer  History of Present Illness: 82 y/o man with PMHx of hypertension, CKD stage 3, gout, anemia of chronic disease, and multiple chronic lower extremity ulcers s/p debridements and several toe amputations presents with new left shin wound. He is seen weekly by a home health nursing aide for wound care for the past few months, but he has not seen a physician since his PCP passed away last year.  His wife first noticed increased swelling on anterior left leg above the ankle since 3/11. The wound became darker then began draining purulent contents since his wife applied pressure while inspecting it with help of home health wound nurse. The ulcer was painful initially but not since it started draining. No fevers, chills, weakness, or change in mental status were reported during this time.  After arrival to the ED vital signs were grossly normal but lactic acidosis of 3.94, CRP 19.7, leukocytosis to 24.1 on initial labs. Xray of left leg showed destructive changes of distal phalanges concerning for osteomyelitis.  Patient not able to give well organized history, collalteral information obtained from wife at bedside.  Meds: Current Facility-Administered Medications  Medication Dose Route Frequency Provider Last Rate Last Dose  . 0.9 %  sodium chloride infusion   Intravenous Continuous Juluis Mire, MD 100 mL/hr at 02/02/16 1852    . acetaminophen (TYLENOL) tablet 650 mg  650 mg Oral Q6H PRN  Juluis Mire, MD       Or  . acetaminophen (TYLENOL) suppository 650 mg  650 mg Rectal Q6H PRN Marjan Rabbani, MD      . enoxaparin (LOVENOX) injection 30 mg  30 mg Subcutaneous Q24H Marjan Rabbani, MD      . ondansetron (ZOFRAN) injection 4 mg  4 mg Intravenous Once [employer], PA-C   4 mg at 02/02/16 1415  . ondansetron (ZOFRAN) tablet 4 mg  4 mg Oral Q6H PRN Marjan Rabbani, MD       Or  . ondansetron (ZOFRAN) injection 4 mg  4 mg Intravenous Q6H PRN Marjan Rabbani, MD      . piperacillin-tazobactam (ZOSYN) IVPB 3.375 g  3.375 g Intravenous Q8H Romona Curls, RPH      . senna-docusate (Senokot-S) tablet 1 tablet  1 tablet Oral QHS PRN Juluis Mire, MD      . Derrill Memo ON 02/03/2016] vancomycin (VANCOCIN) IVPB 750 mg/150 ml premix  750 mg Intravenous Q24H Romona Curls, [facility]       Current Outpatient Prescriptions  Medication Sig Dispense Refill  . allopurinol (ZYLOPRIM) 100 MG tablet Take 100 mg by mouth 2 (two) times daily before a meal.     . furosemide (LASIX) 40 MG tablet Take 40 mg by mouth 2 (two) times daily.  0  . Multiple Vitamins-Minerals (ZINC PO) Take 1 tablet by mouth daily.    Marland Kitchen  acidophilus (RISAQUAD) CAPS capsule Take 2 capsules by mouth daily. (Patient not taking: Reported on 02/02/2016) 60 capsule 1  . feeding supplement, ENSURE ENLIVE, (ENSURE ENLIVE) LIQD Take 237 mLs by mouth 2 (two) times daily between meals. (Patient not taking: Reported on 02/02/2016) 237 mL 12  . feeding supplement, ENSURE, (ENSURE) PUDG Take 1 Container by mouth 3 (three) times daily between meals. (Patient not taking: Reported on 02/02/2016) 24 Container 0    Allergies: Allergies as of 02/02/2016  . (No Known Allergies)   Past Medical History  Diagnosis Date  . Hypertension   . Chronic kidney disease   . Anemia of chronic disease   . Gout   . Acute blood loss anemia 10/18/2013   Past Surgical History  Procedure Laterality Date  . Eye surgery    . Amputation Left 10/19/2013     Procedure: AMPUTATION RAY  LEFT GREAT TOE;  Surgeon: Newt Minion, MD;  Location: [place];  Service: Orthopedics;  Laterality: Left;   History reviewed. No pertinent family history. Social History   Social History  . Marital Status: Married    Spouse Name: N/A  . Number of Children: N/A  . Years of Education: N/A   Occupational History  . Not on file.   Social History Main Topics  . Smoking status: Never Smoker   . Smokeless tobacco: Never Used  . Alcohol Use: No  . Drug Use: No  . Sexual Activity: Not on file   Other Topics Concern  . Not on file   Social History Narrative   Review of Systems: Review of Systems  Constitutional: Negative for fever.  Eyes: Negative for photophobia.  Respiratory: Negative for cough and shortness of breath.   Cardiovascular: Positive for leg swelling. Negative for chest pain.  Gastrointestinal: Negative for nausea and abdominal pain.  Genitourinary: Negative for dysuria.  Musculoskeletal: Negative for joint pain.  Skin: Negative for rash.  Neurological: Negative for dizziness, sensory change and headaches.  Endo/Heme/Allergies: Does not bruise/bleed easily.  Psychiatric/Behavioral: The patient does not have insomnia.    Physical Exam: Blood pressure 167/81, pulse 85, temperature 98.5 F (36.9 C), temperature source Oral, resp. rate 30, height 5' 11"  (1.803 m), weight 83.915 kg (185 lb), SpO2 100 %.   GENERAL- chronically ill appearing elderly man, co-operative, mildly uncomfortable appearing HEENT- Atraumatic, oral mucosa appears dry, dentures in place CARDIAC- RRR, no murmurs, rubs or gallops. RESP- CTAB, no wheezes or crackles. ABDOMEN- Soft, nontender, no guarding or rebound, normoactive bowel sounds present BACK- no paraspinal tenderness, no CVA tenderness. NEURO- CN grossly intact, motor exam limited due to medical condition EXTREMITIES- No pedal edema, large broad based clean ulcers on both feet, medial toes s/p amputation,  aproximately 4cm diameter deep ulcer with purulent drainage over left anterior leg above ankle     SKIN- Warm, dry PSYCH- Not able to give organized history, oriented to person and place   Lab results: Basic Metabolic Panel:  Recent Labs  02/02/16 1339  NA 149*  K 3.2*  CL 118*  CO2 22  GLUCOSE 101*  BUN 31*  CREATININE 1.77*  CALCIUM 8.4*   Liver Function Tests:  Recent Labs  02/02/16 1339  AST 15  ALT 10*  ALKPHOS 62  BILITOT 0.3  PROT 6.4*  ALBUMIN 1.5*    CBC:  Recent Labs  02/02/16 1420  WBC 24.1*  NEUTROABS 21.7*  HGB 7.8*  HCT 24.9*  MCV 83.3  PLT 330   Urinalysis:  Recent Labs  02/02/16 1526  COLORURINE YELLOW  LABSPEC 1.013  PHURINE 6.0  GLUCOSEU NEGATIVE  HGBUR TRACE*  BILIRUBINUR NEGATIVE  KETONESUR NEGATIVE  PROTEINUR 30*  NITRITE NEGATIVE  LEUKOCYTESUR NEGATIVE    Imaging results:  Dg Tibia/fibula Left  02/02/2016  CLINICAL DATA:  Distal lower extremity ulceration EXAM: LEFT TIBIA AND FIBULA - 2 VIEW COMPARISON:  None. FINDINGS: Frontal and lateral views were obtained. There is a large soft tissue ulceration located medial and anterior to the distal tibia and fibula measuring 4.8 x 1.9 cm. A small amount of soft tissue air is seen slightly superior to this large ulceration. There is no erosive change or bony destruction. No fracture or dislocation evident. There is osteoarthritic change in the knee and ankle joint regions. There is marked narrowing in the lateral compartment of the knee joint. There is no abnormal periosteal reaction. IMPRESSION: Large distal soft tissue ulceration anterior and medial in location. A small amount of soft tissue air is seen just superior to this large ulceration which may indicate spread of infection into the soft tissues slightly more superiorly. No bony lesion. Specifically, no osteomyelitis is evident. No fracture or dislocation. There is arthropathy in the knee and shoulder regions. Electronically  Signed   By: Lowella Grip III M.D.   On: 02/02/2016 12:50   Dg Foot Complete Left  02/02/2016  CLINICAL DATA:  82 year old male with a history of foot wound and prior amputation. EXAM: LEFT FOOT - COMPLETE 3+ VIEW COMPARISON:  10/18/2013, 03/26/2015 FINDINGS: Postoperative changes of prior first ray amputation again noted. In the interval, there has been destructive changes of the distal second metatarsal and distal third metatarsal, with periosteal reaction and disruption of the third metatarsal phalangeal joint. Destructive changes present at the proximal phalanx of the third toe. Soft tissue swelling at the third toe. Subcutaneous gas extends laterally from the surgical bed, adjacent to the distal second and third metatarsal. No acute fracture is identified. Soft tissue defect on the distal anterior calf, overlying the tibia. Vascular calcifications. IMPRESSION: Destructive changes of the distal second and third metatarsal as well as the proximal phalanx of the third toe, concerning for osteomyelitis given the adjacent soft tissue defect. Postoperative changes of prior left first ray amputation. Soft tissue defect on the lateral view of the anterior distal calf, potentially a second wound. Evidence of tibial and pedal vascular disease. Signed, Dulcy Fanny. Earleen Newport, DO [facility] Electronically Signed   By: Corrie Mckusick D.O.   On: 02/02/2016 12:52    Other results: EKG: Sinus tachycardia 100s with isolated PVCs  Assessment & Plan by Problem: Left lower leg ulcer, osteomyelitis: New purulent leg ulcer highly concerning for infection in setting of xray suggesting distal osteomyelitis, leukocytosis, high CRP. His wife reports that he has a good walking status and wants to minimize amputations in this process. They were previously recommended to undergo transtibial amputation with Dr. Sharol Given but declined this instead with antibiotic treatment after  few digits removed. Known osteomyelitis on radiography at that time. -empiric coverage with vanc/zosyn -Non-contrast MRI of left leg -Consult orthopedic surgery service in AM -F/U AM CBC -PT eval  -continuous IVF 17m/hr after bolus  Peripheral vascular disease: ABIs from 03/2015 showing 0.6-0.7s range mild-moderate disease. Chronic extremity ulceration with well appearing broad based ulcers and amputation sites.  Chronic Kidney disease: SCr 1.77 actually lower than previous panels. eGFR still borderline for MRI contrast so w/o ordered at this time. -Repeat metabolic panel, Mg  in AM -K-Cl 40 mEq PO  Gout: Off medication for several months, 1106m BID home dose allopurinol.  Anemia of chronic disease: Hgb 7.8 on admission, previously 7.2-8.7 over past year. Probably multifactorial from chronic kidney disease plus chronic leg ulcers with wound care, and poor nutrition status with albumin of 1.5. -Nutrition consult -Ferritin  Diet: NPO VTE ppx: [place] enoxaparin FULL CODE  Dispo: Disposition is deferred at this time, awaiting improvement of current medical problems. Anticipated discharge in approximately 3-5 day(s).   The patient does not have a current PCP and [facility].  The patient does have transportation limitations that hinder transportation to clinic appointments.  Signed: CCollier Salina MD 02/02/2016, 6:56 PM

## 2016-02-02 NOTE — ED Notes (Signed)
Patient labs reported to Nurse Amy at [facility] of patient I-Stat CG4 OF 3.94.

## 2016-02-02 NOTE — ED Notes (Addendum)
Lactic Acid 3.94.  Dr. Anitra LauthPlunkett notified, changed acuity to 2.  Called charge, RN

## 2016-02-02 NOTE — ED Notes (Signed)
Pt. Been stuck x2 only gotten lactic acid

## 2016-02-02 NOTE — ED Provider Notes (Signed)
CSN: [id number]     Arrival date & time 02/02/16  1113 History   First MD Initiated Contact with Patient 02/02/16 1153      (Consider location/radiation/quality/duration/timing/severity/associated sxs/prior Treatment) HPI  Blood pressure 132/67, pulse 60, temperature 98.5 F (36.9 C), temperature source Oral, resp. rate 20, SpO2 94 %.  PEREGRINE NOLT is a 82 y.o. male with PMH of hypertension, chronic kidney disease, gout complaining of new wound to left tib-fib the onset on Sunday patient's wife supplies most of the history, states that she applied pressure to the affected blood and pus was expressed. She denies fever, chills, pain. Patient has multiple wounds to bilateral lower extremities, she's been wife has been caring for them at home with the help of home health nurse. Wife states that patient's primary care physician recently passed away, even trying to make an appointment with little success. States that he's been out of his gout medications for 3 weeks.    Past Medical History  Diagnosis Date  . Hypertension   . Chronic kidney disease   . Anemia of chronic disease   . Gout   . Acute blood loss anemia 10/18/2013   Past Surgical History  Procedure Laterality Date  . Eye surgery    . Amputation Left 10/19/2013    Procedure: AMPUTATION RAY  LEFT GREAT TOE;  Surgeon: Nadara Mustard, MD;  Location: [facility] OR;  Service: Orthopedics;  Laterality: Left;   History reviewed. No pertinent family history. Social History  Substance Use Topics  . Smoking status: Never Smoker   . Smokeless tobacco: Never Used  . Alcohol Use: No    Review of Systems  10 systems reviewed and found to be negative, except as noted in the HPI.   Allergies  Review of patient's allergies indicates no known allergies.  Home Medications   Prior to Admission medications   Medication Sig Start Date End Date Taking? Authorizing Provider  acidophilus (RISAQUAD) CAPS capsule Take 2 capsules by mouth daily.  04/02/15   Leana Roe Elgergawy, MD  allopurinol (ZYLOPRIM) 100 MG tablet Take 100 mg by mouth 2 (two) times daily before a meal.     Historical Provider, MD  feeding supplement, ENSURE ENLIVE, (ENSURE ENLIVE) LIQD Take 237 mLs by mouth 2 (two) times daily between meals. 04/02/15   Leana Roe Elgergawy, MD  feeding supplement, ENSURE, (ENSURE) PUDG Take 1 Container by mouth 3 (three) times daily between meals. 04/02/15   Leana Roe Elgergawy, MD  IRON PO Take 1 tablet by mouth daily.    Historical Provider, MD   BP 132/67 mmHg  Pulse 60  Temp(Src) 98.5 F (36.9 C) (Oral)  Resp 20  SpO2 94% Physical Exam  Constitutional: He is oriented to person, place, and time. He appears well-developed and well-nourished. No distress.  HENT:  Head: Normocephalic and atraumatic.  Mouth/Throat: Oropharynx is clear and moist.  Eyes: Conjunctivae and EOM are normal. Pupils are equal, round, and reactive to light.  Neck: Normal range of motion.  Cardiovascular: Normal rate, regular rhythm and intact distal pulses.   Pulmonary/Chest: Effort normal and breath sounds normal.  Abdominal: Soft. There is no tenderness.  Musculoskeletal: Normal range of motion. He exhibits tenderness.  Neurological: He is alert and oriented to person, place, and time.  Skin: He is not diaphoretic.  Wounds with granulation tissue as photographed. Patient has significant edema to left lower extremity with foul smelling and purulent discharge from the most proximal wound  Psychiatric: He has a normal  mood and affect.  Nursing note and vitals reviewed.         ED Course  Procedures (including critical care time) Labs Review Labs Reviewed  I-STAT CG4 LACTIC ACID, ED - Abnormal; Notable for the following:    Lactic Acid, Venous 3.94 (*)    All other components within normal limits  CULTURE, BLOOD (ROUTINE X 2)  CULTURE, BLOOD (ROUTINE X 2)  COMPREHENSIVE METABOLIC PANEL  CBC WITH DIFFERENTIAL/PLATELET    Imaging Review No  results found. I have personally reviewed and evaluated these images and lab results as part of my medical decision-making.   EKG Interpretation None      MDM   Final diagnoses:  Cellulitis of left lower extremity  Elevated lactic acid level  Subacute osteomyelitis of foot, unspecified laterality (HCC)    Filed Vitals:   02/02/16 1120  BP: 132/67  Pulse: 60  Temp: 98.5 F (36.9 C)  TempSrc: Oral  Resp: 20  SpO2: 94%    Medications  sodium chloride 0.9 % bolus 1,000 mL (not administered)  sodium chloride 0.9 % bolus 1,000 mL (not administered)  piperacillin-tazobactam (ZOSYN) IVPB 3.375 g (not administered)  vancomycin (VANCOCIN) 1,500 mg in sodium chloride 0.9 % 500 mL IVPB (not administered)    Hope PigeonRobert T Sherpa is 82 y.o. male presenting with L smelling wound to left lower extremity. Patient afebrile with stable vital signs however his lactic acid is elevated at 4. X-ray of the left foot shows destructive changing concerning for osteomyelitis of the phalanges. Patient has no changes suggesting osteomyelitis the proximal aspect of the left leg. Patient started on Vanco and Zosyn and will be bolused. Pancultured.   Patient's creatinine is elevated at his baseline at 1.7, potassium is mildly depleted at 3.2. Patient to recheck of lactic acid shows improvement, still elevated. CRP also elevated at 19.   Unassigned admission to Internal medicine: Attending Dr. Oswaldo DoneVincent.     Joni Reiningicole Lamonica Trueba, PA-C 02/03/16 [id number]  Gwyneth SproutWhitney Plunkett, MD 02/03/16 1601

## 2016-02-03 ENCOUNTER — Encounter (HOSPITAL_COMMUNITY): Payer: Self-pay | Admitting: General Practice

## 2016-02-03 ENCOUNTER — Inpatient Hospital Stay (HOSPITAL_COMMUNITY): Payer: Medicare Other

## 2016-02-03 DIAGNOSIS — M109 Gout, unspecified: Secondary | ICD-10-CM | POA: Diagnosis present

## 2016-02-03 DIAGNOSIS — L0889 Other specified local infections of the skin and subcutaneous tissue: Secondary | ICD-10-CM

## 2016-02-03 DIAGNOSIS — E860 Dehydration: Secondary | ICD-10-CM

## 2016-02-03 DIAGNOSIS — L97824 Non-pressure chronic ulcer of other part of left lower leg with necrosis of bone: Secondary | ICD-10-CM

## 2016-02-03 DIAGNOSIS — L03116 Cellulitis of left lower limb: Secondary | ICD-10-CM | POA: Insufficient documentation

## 2016-02-03 DIAGNOSIS — N179 Acute kidney failure, unspecified: Secondary | ICD-10-CM

## 2016-02-03 DIAGNOSIS — B9689 Other specified bacterial agents as the cause of diseases classified elsewhere: Secondary | ICD-10-CM

## 2016-02-03 LAB — PROTIME-INR
INR: 1.28 (ref 0.00–1.49)
PROTHROMBIN TIME: 16.1 s — AB (ref 11.6–15.2)

## 2016-02-03 LAB — CBC WITH DIFFERENTIAL/PLATELET
Basophils Absolute: 0 10*3/uL (ref 0.0–0.1)
Basophils Relative: 0 %
EOS ABS: 0.1 10*3/uL (ref 0.0–0.7)
Eosinophils Relative: 1 %
HCT: 23.6 % — ABNORMAL LOW (ref 39.0–52.0)
HEMOGLOBIN: 7.7 g/dL — AB (ref 13.0–17.0)
LYMPHS ABS: 0.7 10*3/uL (ref 0.7–4.0)
LYMPHS PCT: 4 %
MCH: 27.1 pg (ref 26.0–34.0)
MCHC: 32.6 g/dL (ref 30.0–36.0)
MCV: 83.1 fL (ref 78.0–100.0)
MONOS PCT: 7 %
Monocytes Absolute: 1.2 10*3/uL — ABNORMAL HIGH (ref 0.1–1.0)
NEUTROS PCT: 89 %
Neutro Abs: 16.3 10*3/uL — ABNORMAL HIGH (ref 1.7–7.7)
PLATELETS: 323 10*3/uL (ref 150–400)
RBC: 2.84 MIL/uL — AB (ref 4.22–5.81)
RDW: 15 % (ref 11.5–15.5)
WBC: 18.3 10*3/uL — AB (ref 4.0–10.5)

## 2016-02-03 LAB — RENAL FUNCTION PANEL
ANION GAP: 6 (ref 5–15)
Albumin: 1.6 g/dL — ABNORMAL LOW (ref 3.5–5.0)
BUN: 32 mg/dL — ABNORMAL HIGH (ref 6–20)
CALCIUM: 9.1 mg/dL (ref 8.9–10.3)
CHLORIDE: 118 mmol/L — AB (ref 101–111)
CO2: 23 mmol/L (ref 22–32)
CREATININE: 2.08 mg/dL — AB (ref 0.61–1.24)
GFR, EST AFRICAN AMERICAN: 31 mL/min — AB (ref >60)
GFR, EST NON AFRICAN AMERICAN: 27 mL/min — AB (ref >60)
Glucose, Bld: 90 mg/dL (ref 65–99)
Phosphorus: 3.2 mg/dL (ref 2.5–4.6)
Potassium: 4.1 mmol/L (ref 3.5–5.1)
SODIUM: 147 mmol/L — AB (ref 135–145)

## 2016-02-03 LAB — URINE CULTURE: CULTURE: NO GROWTH

## 2016-02-03 LAB — APTT: APTT: 36 s (ref 24–37)

## 2016-02-03 LAB — TRANSFERRIN: Transferrin: 84 mg/dL — ABNORMAL LOW (ref 180–329)

## 2016-02-03 LAB — HIV ANTIBODY (ROUTINE TESTING W REFLEX): HIV SCREEN 4TH GENERATION: NONREACTIVE

## 2016-02-03 LAB — GLUCOSE, CAPILLARY: GLUCOSE-CAPILLARY: 84 mg/dL (ref 65–99)

## 2016-02-03 LAB — MAGNESIUM: Magnesium: 2 mg/dL (ref 1.7–2.4)

## 2016-02-03 LAB — PROTEIN / CREATININE RATIO, URINE
CREATININE, URINE: 73 mg/dL
PROTEIN CREATININE RATIO: 1 mg/mg{creat} — AB (ref 0.00–0.15)
Total Protein, Urine: 73 mg/dL

## 2016-02-03 LAB — URIC ACID: URIC ACID, SERUM: 8.1 mg/dL — AB (ref 4.4–7.6)

## 2016-02-03 LAB — TECHNOLOGIST SMEAR REVIEW

## 2016-02-03 LAB — TYPE AND SCREEN
ABO/RH(D): O POS
ANTIBODY SCREEN: NEGATIVE

## 2016-02-03 LAB — SEDIMENTATION RATE

## 2016-02-03 LAB — FERRITIN: FERRITIN: 227 ng/mL (ref 24–336)

## 2016-02-03 LAB — LACTIC ACID, PLASMA: Lactic Acid, Venous: 0.9 mmol/L (ref 0.5–2.0)

## 2016-02-03 LAB — PREALBUMIN: Prealbumin: 3.4 mg/dL — ABNORMAL LOW (ref 18–38)

## 2016-02-03 MED ORDER — SODIUM CHLORIDE 0.9 % IV SOLN: INTRAVENOUS | Status: DC

## 2016-02-03 MED ORDER — DEXTROSE-NACL 5-0.45 % IV SOLN
INTRAVENOUS | Status: AC
  Administered 2016-02-03: 17:00:00 via INTRAVENOUS

## 2016-02-03 MED ORDER — SODIUM CHLORIDE 0.9 % IV BOLUS (SEPSIS): 500.0000 mL | Freq: Once | INTRAVENOUS | Status: DC

## 2016-02-03 NOTE — Progress Notes (Addendum)
   Subjective: Patient with no new complaints this morning. Denies feeling febrile, chills, or leg pain. Not participatory with physical therapy.  Objective: Vital signs in last 24 hours: Filed Vitals:   02/02/16 1930 02/02/16 2005 02/03/16 0507 02/03/16 1451  BP: 158/87 182/73 163/83 170/80  Pulse: 88 91 87 82  Temp:  98.9 F (37.2 C) 98 F (36.7 C) 98.7 F (37.1 C)  TempSrc:  Oral Oral Oral  Resp: 23 20 21 18   Height:      Weight:   70.7 kg (155 lb 13.8 oz)   SpO2: 100% 97% 94% 96%   Weight change:   Intake/Output Summary (Last 24 hours) at 02/03/16 1457 Last data filed at 02/03/16 0738  Gross per 24 hour  Intake 1376.67 ml  Output      0 ml  Net 1376.67 ml   GENERAL- chronically ill appearing elderly man, co-operative, NAD HEENT- Atraumatic, temporal wasting CARDIAC- RRR, no murmurs, rubs or gallops. RESP- CTAB, no wheezes or crackles. EXTREMITIES- No pedal edema, large broad based clean ulcers on both feet in dressing, medial toes s/p amputation, aproximately 4cm diameter, malodorous and deep ulcer with purulent drainage over left anterior leg above ankle SKIN- Warm, dry PSYCH- alert, pleasant, conversant   Medications: I have reviewed the patient's current medications. Scheduled Meds: . allopurinol  100 mg Oral BID AC  . enoxaparin (LOVENOX) injection  30 mg Subcutaneous Q24H  . piperacillin-tazobactam (ZOSYN)  IV  3.375 g Intravenous Q8H  . vancomycin  750 mg Intravenous Q24H   Continuous Infusions: . sodium chloride     PRN Meds:.acetaminophen **OR** acetaminophen, ondansetron **OR** ondansetron (ZOFRAN) IV, senna-docusate Assessment/Plan: Left lower leg ulcer: Xray originally concerning for osteomyelitis MRI does not show extension proximally. Leukocytosis improving. Clinically the patient was not complaining from presentation so no change. He was not able to work with physical therapy due to leg wound today. Patient was previously recommended amputation  last year but refused strongly at that time. He wants to hear about any alternatives that might be available vs amputation. His prior leg ulcers remain partially healed for months now with new more proximal ulceration. -Consulted orthopedic surgery for evaluation and drainage/debridement versus amputation -continue empiric abtx coverage vanc/zosyn 3/15>>>>  Chronic kidney disease: At baseline function. Continuing maintenance fluids while NPO pending surgical evaluation. Potassium up to 4.1 from 3.2. -Discontinue K-Cl supplementation -renew IVF 14800mL/hr  Anemia of chronic disease: Ferritin of 227 supports chronic anemia with no clinical source of blood loss. May have some chronic malnutrition but not significantly deficient at this time -Nutrition consulted, recs appreciated  Dispo: Disposition is deferred at this time, awaiting improvement of current medical problems.  >Discussed case with Dr. Lajoyce Cornersuda and Dr. Magnus IvanBlackman.  The patient does not have a current PCP Vincent Bottom(Alvin Corliss MarcusVincent Blount, MD) and [facility] after discharge.  The patient does have transportation limitations that hinder transportation to clinic appointments.   LOS: 1 day   Vincent Planhristopher W Esias Mory, MD 02/03/2016, 2:57 PM

## 2016-02-03 NOTE — Progress Notes (Signed)
Utilization review completed.  

## 2016-02-03 NOTE — Progress Notes (Signed)
Initial Nutrition Assessment  DOCUMENTATION CODES:   Severe malnutrition in context of chronic illness  INTERVENTION:  -Boost Breeze po TID, each supplement provides 250 kcal and 9 grams of protein w/ diet advancement -RD continue to monitor for needs    NUTRITION DIAGNOSIS:   Malnutrition related to chronic illness as evidenced by severe depletion of muscle mass, severe depletion of body fat.  GOAL:   Patient will meet greater than or equal to 90% of their needs  MONITOR:   Labs, I & O's, Diet advancement, Skin, Weight trends, Supplement acceptance  REASON FOR ASSESSMENT:   Consult Assessment of nutrition requirement/status  ASSESSMENT:   82 y/o man with PMHx of hypertension, CKD stage 3, gout, anemia of chronic disease, and multiple chronic lower extremity ulcers s/p debridements and several toe amputations presents with new left shin wound. He is seen weekly by a home health nursing aide for wound care for the past few months, but he has not seen a physician since his PCP passed away last year  Vincent Miranda is a pleasant 82 yo man who unfortunately suffers from multiple ulcers on his lower extremities. He is here to be assessed by an orthopedist for possible amputation of his LLE. He states that 3-4 years ago, he had issues swallowing with food/drinks going into his trachea vs. His esophagus. It apparently cleared up on its own. During this time span he lost a significant amount of weight, usual weight being about 205#  Currently he states he has a good appetite, has not been losing weight.   Nutrition-Focused physical exam completed. Findings are severe fat depletion, severe muscle depletion, and no edema.   He stated that ensure gave him diarrhea. Will try boost breeze after surgery.  Labs: Na 147, CBG 147, Medications:  Alluporuinol  Vincent Miranda also has a hx of service in [employer] [place] [employer] [place] I thanked him for his service.   Diet Order:  Diet NPO time  specified Except for: Sips with Meds  Skin:   Multiple PU to lower extremities,  Last BM:  3/15  Height:   Ht Readings from Last 1 Encounters:  02/02/16 5\' 11"  (1.803 m)    Weight:   Wt Readings from Last 1 Encounters:  02/03/16 155 lb 13.8 oz (70.7 kg)    Ideal Body Weight:  78.18 kg  BMI:  Body mass index is 21.75 kg/(m^2).  Estimated Nutritional Needs:   Kcal:  1700-2100  Protein:  90-105 grams  Fluid:  >/= 1.7L  EDUCATION NEEDS:   No education needs identified at this time  Dionne AnoWilliam M. Tremon Sainvil, MS, RD LDN After Hours/Weekend Pager [phone]

## 2016-02-03 NOTE — Progress Notes (Signed)
Patient ID: Vincent PigeonRobert T Miranda, male   DOB: [birth year], 82 y.o.   MRN: [id number] I spoke with [name] about his situation.  He still feels that efforts should be made to try to treat his left ankle wounds other than consenting to an amputation.  Could have the PT Wound Service come by and try hydrotherapy to the wounds daily while he is in the hospital.

## 2016-02-03 NOTE — Evaluation (Signed)
Physical Therapy Evaluation Patient Details Name: Vincent Miranda MRN: [id number] DOB: 01-Feb-1925 Today's Date: 02/03/2016   History of Present Illness  82 y/o man with PMHx of hypertension, CKD stage 3, gout, anemia of chronic disease, and multiple chronic lower extremity ulcers s/p debridements and several toe amputations presents with new left shin wound/cellulitis.  Clinical Impression  Patient demonstrates deficits in functional mobility as indicated below. Will need continued skilled PT to address deficits and maximize function. Will see as indicated and progress as tolerated. At this time, session limited by patient and patient cognition. Will follow up pending outcome of interventions for further needs.    Follow Up Recommendations Home health PT;Supervision/Assistance - 24 hour (vs SNF pending level of assist at home)    Equipment Recommendations  Other (comment) (TBD)    Recommendations for Other Services       Precautions / Restrictions Precautions Precautions: Fall      Mobility  Bed Mobility Overal bed mobility: Needs Assistance Bed Mobility: Supine to Sit;Sit to Supine     Supine to sit: Min assist Sit to supine: Min assist;+2 for safety/equipment   General bed mobility comments: Vcs for positioning, assist to initiate, cues to direct to tasks. increased time to perform  Transfers Overall transfer level: Needs assistance Equipment used: Rolling walker (2 wheeled) Transfers: Sit to/from Stand Sit to Stand: Min assist         General transfer comment: Min assist to power to standing. Assist for stability, manual assist for hand placement.  Ambulation/Gait             General Gait Details: patient deferring, would not perform at this time  Stairs            Wheelchair Mobility    Modified Rankin (Stroke Patients Only)       Balance Overall balance assessment: History of Falls                                            Pertinent Vitals/Pain Pain Assessment: No/denies pain    Home Living Family/patient expects to be discharged to:: Private residence Living Arrangements: Spouse/significant other Available Help at Discharge: Family;Available 24 hours/day Type of Home: House Home Access: Stairs to enter Entrance Stairs-Rails: None Entrance Stairs-Number of Steps: 1 Home Layout: One level Home Equipment: Walker - 2 wheels;Crutches;Bedside commode;Tub bench Additional Comments: taken from chart review, no family present at times of evaluation    Prior Function Level of Independence: Independent;Needs assistance      ADL's / Homemaking Assistance Needed: Wife assisted with ADL's prn  Comments: history taken per review of chart from previous admission, will need to verify     Hand Dominance   Dominant Hand: Left    Extremity/Trunk Assessment   Upper Extremity Assessment: Defer to OT evaluation           Lower Extremity Assessment: Generalized weakness;Difficult to assess due to impaired cognition (pt with poor understanding of assessment, very deflective)         Communication   Communication: No difficulties  Cognition Arousal/Alertness: Awake/alert Behavior During Therapy: Flat affect Overall Cognitive Status: Impaired/Different from baseline Area of Impairment: Attention;Orientation;Memory;Following commands;Safety/judgement;Awareness;Problem solving Orientation Level: Disoriented to;Situation;Time Current Attention Level: Sustained Memory: Decreased recall of precautions;Decreased short-term memory Following Commands: Follows one step commands with increased time Safety/Judgement: Decreased awareness of safety;Decreased awareness of deficits Awareness: Intellectual  Problem Solving: Slow processing;Decreased initiation;Difficulty sequencing;Requires verbal cues;Requires tactile cues General Comments: Patient with poor insight and awareness. Patient required max cues to  direct to task and initiate movements. Patient with some deflection during cognitive assessemtn for orientation and during attempts to carry out tasks    General Comments      Exercises        Assessment/Plan    PT Assessment Patient needs continued PT services  PT Diagnosis Difficulty walking;Abnormality of gait;Altered mental status   PT Problem List Decreased strength;Decreased activity tolerance;Decreased balance;Decreased mobility;Decreased coordination;Decreased cognition;Decreased safety awareness;Impaired sensation;Decreased skin integrity  PT Treatment Interventions DME instruction;Gait training;Stair training;Functional mobility training;Therapeutic activities;Therapeutic exercise;Balance training;Patient/family education   PT Goals (Current goals can be found in the Care Plan section) Acute Rehab PT Goals Patient Stated Goal: none stated PT Goal Formulation: With patient Time For Goal Achievement: 02/17/16 Potential to Achieve Goals: Fair    Frequency Min 3X/week   Barriers to discharge        Co-evaluation               End of Session Equipment Utilized During Treatment: Gait belt Activity Tolerance: Patient limited by fatigue;Other (comment) (cognitive) Patient left: in bed;with call bell/phone within reach;with bed alarm set Nurse Communication: Mobility status         Time: [id number] PT Time Calculation (min) (ACUTE ONLY): 18 min   Charges:   PT Evaluation $PT Eval Moderate Complexity: 1 Procedure     PT G CodesFabio Asa:        Nickol Collister J 02/03/2016, 2:07 PM Charlotte Crumbevon Rogena Deupree, PT DPT  [phone]

## 2016-02-03 NOTE — Progress Notes (Signed)
Rept to Dr. Danella Pentonruong. Order received to start pt on carb mod diet. Clarified running IV order-D51/2 NS at 100cc/hr for 10 hours and rept that pt's sodium this AM was 147. No other orders received.

## 2016-02-03 NOTE — Progress Notes (Signed)
Rept to Dr. Dimple Caseyice by Delight HohMarcella [facility]([place]aphne) Tom-Johnson RN that pt's BP is 172/75. Orders received to d/c NS IV bolus. Will continue to monitor.

## 2016-02-03 NOTE — Evaluation (Signed)
Occupational Therapy Evaluation Patient Details Name: Vincent Miranda MRN: [id number] DOB: 07-07-1925 Today's Date: 02/03/2016    History of Present Illness 82 y/o man with PMHx of hypertension, CKD stage 3, gout, anemia of chronic disease, and multiple chronic lower extremity ulcers s/p debridements and several toe amputations presents with new left shin wound/cellulitis.   Clinical Impression   Very limited OT eval performed secondary to pts cognitive status and refusing OOB activities. During session; RN notified pt of no longer being NPO and pt will get food soon. After that; pt perseverating on eating prior to any activity. Pt becoming agitated and not understanding why OT could not "take his word" for how he was able to do things despite explanation of why OT needs to see pt participate in activities and max verbal encouragement. Pt presenting with bilateral UE weakness, R>L, and decreased ROM in R shoulder limiting his independence with ADLs PTA; wife providing assist as needed. OT recommendations to follow after observing pts participation in ADL activities and functional mobility. Pt would benefit from continued skilled OT to address established goals.    Follow Up Recommendations  Other (comment);Supervision/Assistance - 24 hour (TBD upon further evaluation)    Equipment Recommendations  Other (comment) (TBD)    Recommendations for Other Services       Precautions / Restrictions Precautions Precautions: Fall Restrictions Weight Bearing Restrictions: No      Mobility Bed Mobility    General bed mobility comments: Pt refused-unable to assess.  Transfers          General transfer comment: Pt refused-unable to assess.    Balance Overall balance assessment: History of Falls                                          ADL Overall ADL's : Needs assistance/impaired                                       General ADL Comments: Pts  wife present during OT eval. Pt refusing any OOB or ADL activities at this time. Pt perseverating on getting food once RN came into room stating he was no longer NPO. Pt getting agitated and not understanding why OT couldnt "take his word" for being able to do things; explained to pt that OT needed to see acitivity first hand in order to document and make recommendations. Pt adamantly refusing to work with OT until he is able to eat despite max verbal encouragement.     Vision     Perception     Praxis      Pertinent Vitals/Pain Pain Assessment: No/denies pain     Hand Dominance Left   Extremity/Trunk Assessment Upper Extremity Assessment Upper Extremity Assessment: RUE deficits/detail;LUE deficits/detail RUE Deficits / Details: Limited shoulder AROM ~90 degrees, PROM WFL. Decreased strength overall 3+/5. Decreased grip strength. RUE Coordination: decreased fine motor;decreased gross motor LUE Deficits / Details: AROM WFL. Decreased strength overall 4+/5. Decreased grip strength.   Lower Extremity Assessment Lower Extremity Assessment: Defer to PT evaluation       Communication Communication Communication: No difficulties   Cognition Arousal/Alertness: Awake/alert Behavior During Therapy: Flat affect Overall Cognitive Status: Impaired/Different from baseline Area of Impairment: Attention;Following commands;Safety/judgement;Awareness;Problem solving;Orientation Orientation Level: Disoriented to;Situation;Time Current Attention Level: Sustained Memory: Decreased recall of precautions;Decreased  short-term memory Following Commands: Follows one step commands with increased time Safety/Judgement: Decreased awareness of safety;Decreased awareness of deficits Awareness: Intellectual Problem Solving: Slow processing;Decreased initiation;Difficulty sequencing;Requires verbal cues;Requires tactile cues General Comments: Pt perseverating on getting something to eat. Pt not  understanding why OT needs to see pt move OOB for eval; getting agitated stating he doesnt understand why OT cant take his word for how he moves.   General Comments       Exercises       Shoulder Instructions      Home Living Family/patient expects to be discharged to:: Private residence Living Arrangements: Spouse/significant other Available Help at Discharge: Family;Available 24 hours/day Type of Home: House Home Access: Stairs to enter [employer] CorporationEntrance Stairs-Number of Steps: 1 Entrance Stairs-Rails: None Home Layout: One level     Bathroom Shower/Tub: Tub/shower unit Shower/tub characteristics: Engineer, building servicesCurtain Bathroom Toilet: Standard     Home Equipment: Environmental consultantWalker - 2 wheels;Crutches;Bedside commode;Tub bench   Additional Comments: taken from chart review, no family present at times of evaluation      Prior Functioning/Environment Level of Independence: Needs assistance    ADL's / Homemaking Assistance Needed: Wife assisting with ADLs over the past few weeks       OT Diagnosis: Generalized weakness;Cognitive deficits   OT Problem List: Decreased strength;Decreased range of motion;Decreased activity tolerance;Impaired balance (sitting and/or standing);Decreased cognition;Decreased safety awareness;Decreased knowledge of use of DME or AE;Decreased knowledge of precautions;Impaired UE functional use   OT Treatment/Interventions: Self-care/ADL training;Therapeutic exercise;Energy conservation;DME and/or AE instruction;Therapeutic activities;Patient/family education;Balance training    OT Goals(Current goals can be found in the care plan section) Acute Rehab OT Goals Patient Stated Goal: get food OT Goal Formulation: With patient Time For Goal Achievement: 02/17/16 Potential to Achieve Goals: Good ADL Goals Pt/caregiver will Perform Home Exercise Program: Increased strength;Increased ROM;With theraband;With written HEP provided;With Supervision  OT Frequency: Min 2X/week   Barriers  to D/C:            Co-evaluation              End of Session    Activity Tolerance: Other (comment);Patient limited by fatigue (cognition) Patient left: in bed;with call bell/phone within reach;with nursing/sitter in room   Time: 1551-1617 OT Time Calculation (min): 26 min Charges:  OT General Charges $OT Visit: 1 Procedure OT Evaluation $OT Eval Moderate Complexity: 1 Procedure OT Treatments $Therapeutic Activity: 8-22 mins G-Codes:     Gaye AlkenBailey A Zaley Talley M.S., OTR/L Pager: [phone]  02/03/2016, 4:29 PM

## 2016-02-03 NOTE — Consult Note (Signed)
ORTHOPAEDIC CONSULTATION  REQUESTING PHYSICIAN: Tyson Alias, MD  Chief Complaint: Osteomyelitis left foot with necrotic gangrenous [place] ulcer over the distal tibia  HPI: Vincent Miranda is a 82 y.o. male who presents with a large necrotic purulent ulcer over the distal tibia. Patient also has chronic osteomyelitis of the midfoot and hindfoot with a large necrotic ulcer. Patient was previously followed in my office and I recommended an below the knee amputation. Discussed the patient's infection is life-threatening. Patient and his wife refused surgery at that time. I discussed with the patient's wife again today that this is a life-threatening infection and that this could kill him patient's wife states" I know".  Past Medical History  Diagnosis Date  . Hypertension   . Chronic kidney disease   . Anemia of chronic disease   . Gout   . Acute blood loss anemia 10/18/2013  . History of blood transfusion    Past Surgical History  Procedure Laterality Date  . Eye surgery Right     "had accident; had to replace lens in eye"  . Amputation Left 10/19/2013    Procedure: AMPUTATION RAY  LEFT GREAT TOE;  Surgeon: Nadara Mustard, MD;  Location: [facility] OR;  Service: Orthopedics;  Laterality: Left;   Social History   Social History  . Marital Status: Married    Spouse Name: N/A  . Number of Children: N/A  . Years of Education: N/A   Social History Main Topics  . Smoking status: Former Smoker -- 1.00 packs/day for 43 years    Types: Cigarettes  . Smokeless tobacco: Never Used     Comment: "quit smoking cigarettes in the 1980s"  . Alcohol Use: Yes     Comment: "nothing anymore" (02/03/2016)  . Drug Use: No  . Sexual Activity: Not Currently   Other Topics Concern  . None   Social History Narrative   History reviewed. No pertinent family history. - negative except otherwise stated in the family history section No Known Allergies Prior to Admission medications     Medication Sig Start Date End Date Taking? Authorizing Provider  allopurinol (ZYLOPRIM) 100 MG tablet Take 100 mg by mouth 2 (two) times daily before a meal.    Yes Historical Provider, MD  furosemide (LASIX) 40 MG tablet Take 40 mg by mouth 2 (two) times daily. 01/03/16  Yes Historical Provider, MD  Multiple Vitamins-Minerals (ZINC PO) Take 1 tablet by mouth daily.   Yes Historical Provider, MD  acidophilus (RISAQUAD) CAPS capsule Take 2 capsules by mouth daily. Patient not taking: Reported on 02/02/2016 04/02/15   Leana Roe Elgergawy, MD  feeding supplement, ENSURE ENLIVE, (ENSURE ENLIVE) LIQD Take 237 mLs by mouth 2 (two) times daily between meals. Patient not taking: Reported on 02/02/2016 04/02/15   Leana Roe Elgergawy, MD  feeding supplement, ENSURE, (ENSURE) PUDG Take 1 Container by mouth 3 (three) times daily between meals. Patient not taking: Reported on 02/02/2016 04/02/15   Starleen Arms, MD   Dg Tibia/fibula Left  02/02/2016  CLINICAL DATA:  Distal lower extremity ulceration EXAM: LEFT TIBIA AND FIBULA - 2 VIEW COMPARISON:  None. FINDINGS: Frontal and lateral views were obtained. There is a large soft tissue ulceration located medial and anterior to the distal tibia and fibula measuring 4.8 x 1.9 cm. A small amount of soft tissue air is seen slightly superior to this large ulceration. There is no erosive change or bony destruction. No fracture or dislocation evident. There is osteoarthritic change in  the knee and ankle joint regions. There is marked narrowing in the lateral compartment of the knee joint. There is no abnormal periosteal reaction. IMPRESSION: Large distal soft tissue ulceration anterior and medial in location. A small amount of soft tissue air is seen just superior to this large ulceration which may indicate spread of infection into the soft tissues slightly more superiorly. No bony lesion. Specifically, no osteomyelitis is evident. No fracture or dislocation. There is  arthropathy in the knee and shoulder regions. Electronically Signed   By: Bretta BangWilliam  Woodruff III M.D.   On: 02/02/2016 12:50   Mr Tibia Fibula Left Wo Contrast  02/03/2016  CLINICAL DATA:  Chronic ulcers along the lower legs. Toe amputations. New wound along the left shin. Increased swelling. EXAM: MRI OF LOWER LEFT EXTREMITY WITHOUT CONTRAST TECHNIQUE: Multiplanar, multisequence MR imaging of the left lower leg was performed. No intravenous contrast was administered. COMPARISON:  02/02/2016 FINDINGS: Asymmetric cutaneous ulceration anteriorly along the lower leg just above the level of the ankle, skin defect approximately 5.3 by 5.0 cm, with a small amount of gas tracking in the soft tissues adjacent to the ulceration and with confluent edema tracking in the adjacent subcutaneous tissues. A lesser amount of subcutaneous edema tracks throughout the calf and there is low-level edema tracking in the calf musculature bilaterally. The tibialis anterior tendon appears discontinuous along the ulceration, images 15-18 series 4. No underlying osteomyelitis or non draining ulcer in this area. There is a suggestion of cutaneous ulceration overlying both right and left medial malleoli. IMPRESSION: 1. Large cutaneous ulceration of the anterior lower leg just above the level of the ankle joint. There is discontinuity of the tibialis anterior tendon in the vicinity of this deep ulceration, as well as confluent subcutaneous edema likely reflecting local surrounding cellulitis. 2. Generalized subcutaneous edema in the calves, left greater than right, and also in the regional muscles. Although possibly from noninfectious causes, the possibility of myositis and cellulitis is difficult to exclude. 3. No non draining abscess is seen. No regional osteomyelitis identified. 4. There is are likely ulcerations overlying both the right and left medial malleoli. Electronically Signed   By: Gaylyn RongWalter  Liebkemann M.D.   On: 02/03/2016 07:48    Dg Foot Complete Left  02/02/2016  CLINICAL DATA:  82 year old male with a history of foot wound and prior amputation. EXAM: LEFT FOOT - COMPLETE 3+ VIEW COMPARISON:  10/18/2013, 03/26/2015 FINDINGS: Postoperative changes of prior first ray amputation again noted. In the interval, there has been destructive changes of the distal second metatarsal and distal third metatarsal, with periosteal reaction and disruption of the third metatarsal phalangeal joint. Destructive changes present at the proximal phalanx of the third toe. Soft tissue swelling at the third toe. Subcutaneous gas extends laterally from the surgical bed, adjacent to the distal second and third metatarsal. No acute fracture is identified. Soft tissue defect on the distal anterior calf, overlying the tibia. Vascular calcifications. IMPRESSION: Destructive changes of the distal second and third metatarsal as well as the proximal phalanx of the third toe, concerning for osteomyelitis given the adjacent soft tissue defect. Postoperative changes of prior left first ray amputation. Soft tissue defect on the lateral view of the anterior distal calf, potentially a second wound. Evidence of tibial and pedal vascular disease. Signed, Yvone NeuJaime S. Loreta AveWagner, DO [facility] Electronically Signed   By: Gilmer MorJaime  Wagner D.O.   On: 02/02/2016 12:52   - pertinent xrays, CT, MRI studies were reviewed  and independently interpreted  Positive ROS: All other systems have been reviewed and were otherwise negative with the exception of those mentioned in the HPI and as above.  Physical Exam: General: Patient is lethargic with minimal responsiveness. Cardiovascular: Swelling left lower extremity Respiratory: No cyanosis, no use of accessory musculature GI: No organomegaly, abdomen is soft and non-tender Skin: Necrotic wound over the anterior aspect the left tibia there is exposed tibia with purulent drainage and  necrotic tissue approximate 5 cm in diameter. Neurologic: Patient does not have protective sensation. Psychiatric: Patient is competent for consent with normal mood and affect Lymphatic: No axillary or cervical lymphadenopathy  MUSCULOSKELETAL:  On examination patient has a foul-smelling purulent drainage ulcer from the anterior aspect of the distal tibia on the left. There is exposed bone over the hindfoot and midfoot medially. His MRI scan and radiographs were reviewed. Patient has osteomyelitis of the distal tibia hindfoot and midfoot with wet gangrenous changes.  Assessment: Assessment: Wet gangrenous ulcer of the distal tibia with exposed tibia and exposed hindfoot and midfoot with gangrenous changes of the foot.  Plan: Plan: I discussed with the patient and his wife that this again is a life-threatening problem. Patient's wife states she would like to consider some type of debridement. I stated that this is not medically appropriate and I have encouraged her to get a second opinion from another orthopedic doctor while in the hospital. I discussed that I will be out of the office from Friday through Monday and I could not do the surgery during this period of time. Patient would require an above-the-knee amputation.  Please obtain a second opinion consult from the orthopedist on call.  Thank you for the consult and the opportunity to see Mr. Lottie Mussel, MD [facility] [phone] 5:59 PM

## 2016-02-03 NOTE — Progress Notes (Signed)
Internal Medicine Attending:   I saw and examined the patient. I reviewed the resident's note and I agree with the resident's findings and plan as documented in the resident's note.  Left lower extremity with severe soft tissue infection due to chronic nonhealing ischemic ulcers. He was treated empirically with broad-spectrum IV antibiotics overnights because of his signs of systemic infection and seems to be doing a little better today. Still looks dehydrated, height per day treatment neck with some AK I. Would continue IV fluid hydration today. We have asked for surgical consultation and I hope the patient agrees to amputation as I would expect these wounds to continue to worsen.

## 2016-02-04 LAB — BASIC METABOLIC PANEL
Anion gap: 7 (ref 5–15)
BUN: 28 mg/dL — AB (ref 6–20)
CHLORIDE: 118 mmol/L — AB (ref 101–111)
CO2: 22 mmol/L (ref 22–32)
CREATININE: 2.05 mg/dL — AB (ref 0.61–1.24)
Calcium: 9.3 mg/dL (ref 8.9–10.3)
GFR calc Af Amer: 31 mL/min — ABNORMAL LOW (ref >60)
GFR, EST NON AFRICAN AMERICAN: 27 mL/min — AB (ref >60)
GLUCOSE: 92 mg/dL (ref 65–99)
POTASSIUM: 3.9 mmol/L (ref 3.5–5.1)
SODIUM: 147 mmol/L — AB (ref 135–145)

## 2016-02-04 LAB — CBC
HCT: 23.8 % — ABNORMAL LOW (ref 39.0–52.0)
Hemoglobin: 7.2 g/dL — ABNORMAL LOW (ref 13.0–17.0)
MCH: 25.4 pg — AB (ref 26.0–34.0)
MCHC: 30.3 g/dL (ref 30.0–36.0)
MCV: 83.8 fL (ref 78.0–100.0)
PLATELETS: 351 10*3/uL (ref 150–400)
RBC: 2.84 MIL/uL — AB (ref 4.22–5.81)
RDW: 15.2 % (ref 11.5–15.5)
WBC: 13.2 10*3/uL — ABNORMAL HIGH (ref 4.0–10.5)

## 2016-02-04 LAB — HEPATITIS C ANTIBODY (REFLEX): HCV Ab: 0.3 s/co ratio (ref 0.0–0.9)

## 2016-02-04 LAB — HCV COMMENT:

## 2016-02-04 MED ORDER — PRO-STAT SUGAR FREE PO LIQD
30.0000 mL | Freq: Two times a day (BID) | ORAL | Status: DC
  Administered 2016-02-04 – 2016-02-07 (×5): 30 mL via ORAL
  Filled 2016-02-04 (×7): qty 30

## 2016-02-04 MED ORDER — DEXTROSE-NACL 5-0.45 % IV SOLN
INTRAVENOUS | Status: DC
  Administered 2016-02-04: 22:00:00 via INTRAVENOUS

## 2016-02-04 MED ORDER — DEXTROSE-NACL 5-0.45 % IV SOLN
INTRAVENOUS | Status: AC
  Administered 2016-02-04: 10:00:00 via INTRAVENOUS

## 2016-02-04 MED ORDER — BOOST / RESOURCE BREEZE PO LIQD
1.0000 | Freq: Two times a day (BID) | ORAL | Status: DC
  Administered 2016-02-04 – 2016-02-07 (×6): 1 via ORAL

## 2016-02-04 NOTE — Progress Notes (Signed)
Nutrition Follow-up  DOCUMENTATION CODES:   Severe malnutrition in context of chronic illness  INTERVENTION:  Provide Boost Breeze po BID, each supplement provides 250 kcal and 9 grams of protein.  Provide 30 ml Prostat po BID, each supplement provides 100 kcal and 15 grams of protein.   Encourage adequate PO intake.   NUTRITION DIAGNOSIS:   Malnutrition related to chronic illness as evidenced by severe depletion of muscle mass, severe depletion of body fat; ongoing  GOAL:   Patient will meet greater than or equal to 90% of their needs; progressing  MONITOR:   PO intake, Supplement acceptance, Weight trends, Labs, I & O's, Skin  REASON FOR ASSESSMENT:   Consult Assessment of nutrition requirement/status  ASSESSMENT:   82 y/o man with PMHx of hypertension, CKD stage 3, gout, anemia of chronic disease, and multiple chronic lower extremity ulcers s/p debridements and several toe amputations presents with new left shin wound. He is seen weekly by a home health nursing aide for wound care for the past few months, but he has not seen a physician since his PCP passed away last year  Diet is currently carb modified diet. RD to order nutritional supplementation to aid in wound healing. Pt reports Ensure causes him diarrhea. RD to order Boost Breeze and Prostat. Encourage adequate PO intake.   Labs: Low GFR. High sodium, chloride, BUN, and creatinine.  Diet Order:  Diet Carb Modified Fluid consistency:: Thin; Room service appropriate?: Yes  Skin:  Wound (see comment) (Multiple ulcers on lower extremities, diabetic ulcer R foot)  Last BM:  3/15  Height:   Ht Readings from Last 1 Encounters:  02/02/16 5\' 11"  (1.803 m)    Weight:   Wt Readings from Last 1 Encounters:  02/04/16 157 lb 10.1 oz (71.5 kg)    Ideal Body Weight:  78.18 kg  BMI:  Body mass index is 21.99 kg/(m^2).  Estimated Nutritional Needs:   Kcal:  1700-1900  Protein:  85-95 grams  Fluid:  >/=  1.7L  EDUCATION NEEDS:   No education needs identified at this time  Roslyn SmilingStephanie Yarisa Lynam, MS, RD, LDN Pager # [phone] After hours/ weekend pager # [phone]

## 2016-02-04 NOTE — Progress Notes (Signed)
Subjective: Patient feeling a bit short of breath with moving in bed today, otherwise no new complaints. He spoke with Dr. Lajoyce Corners and Dr. Magnus Ivan yesterday who recommended amputation of the left leg to cure his current infection. He does not want this option despite being informed the infection is unlikely to resolve without it and wants to continued trying alternative therapies.   Objective: Vital signs in last 24 hours: Filed Vitals:   02/03/16 1605 02/03/16 2025 02/04/16 0409 02/04/16 0700  BP: 172/75 174/73 184/76   Pulse:  76 66   Temp:  98.8 F (37.1 C) 97.8 F (36.6 C)   TempSrc:  Oral Oral   Resp:  18 18   Height:      Weight:   71.5 kg (157 lb 10.1 oz)   SpO2:  96% 97% 99%   Weight change: -12.416 kg (-27 lb 5.9 oz)  Intake/Output Summary (Last 24 hours) at 02/04/16 1156 Last data filed at 02/04/16 0409  Gross per 24 hour  Intake    300 ml  Output    650 ml  Net   -350 ml   GENERAL- chronically ill appearing elderly man, co-operative, NAD HEENT- Slightly dry oral mucosa, temporal wasting CARDIAC- RRR, no murmurs, rubs or gallops. RESP- slightly prolonged expiratory phase and faint wheezes EXTREMITIES- Left leg slightly swollen compared to right, large broad based clean ulcers on both feet in dressing, medial toes s/p amputation, aproximately 4cm diameter, malodorous and deep ulcer with purulent drainage over left anterior leg above ankle SKIN- Warm, dry, poor skin turgor PSYCH- alert, pleasant, conversant  Medications: I have reviewed the patient's current medications. Scheduled Meds: . allopurinol  100 mg Oral BID AC  . enoxaparin (LOVENOX) injection  30 mg Subcutaneous Q24H  . piperacillin-tazobactam (ZOSYN)  IV  3.375 g Intravenous Q8H  . vancomycin  750 mg Intravenous Q24H   Continuous Infusions: . dextrose 5 % and 0.45% NaCl 100 mL/hr at 02/04/16 0945   PRN Meds:.acetaminophen **OR** acetaminophen, ondansetron **OR** ondansetron (ZOFRAN) IV,  senna-docusate Assessment/Plan: Left lower leg ulcer: Chronic leg ulceration now with infection that will probably never resolve without amputation due to his chronic peripheral vascular disease. Patient was requesting any alterative options and spoke with 2 orthopedic surgeons yesterday. I agree with their assessment any surgery besides amputation would be of little benefit and significant risk since it could not heal well with his poor perfusion of the site. Based on the patient's wishes we will plan to continued conservative measures including IV antibiotics and hydrotherapy for the next few days following his clinical progress until 3/21 before determining a final plan. At that time will need to decide amputation vs suboptimal treatment plan with wound care and IV abtx vs discussing hospice care -continue empiric abtx coverage vanc/zosyn 3/15>>>> -F/U Blood Cx from 3/15>>> -Hydrotherapy -Reassess to determine plan 3/21  Chronic kidney disease: At baseline function. Dehydrated on clinical exam with sodium 147. Will replace fluids since his PO intake is mediocre and significant IV medication salt contents. -IVF D5%1/2NS  Anemia of chronic disease: Hgb in 7s. Stable. Significant CKD. No intervention indicated, no iron deficiency.   Diet: Regular VTE ppx: [place] enoxaparin FULL CODE  Dispo: Disposition is deferred at this time, awaiting improvement of current medical problems.  The patient does not have a current PCP (No Pcp Per Patient) and does need an [facility] hospital follow-up appointment after discharge. The patient does have transportation limitations that hinder transportation to clinic appointments.   LOS:  2 days   Fuller Planhristopher W Laurieann Friddle, MD 02/04/2016, 11:56 AM

## 2016-02-04 NOTE — Progress Notes (Signed)
Patients blood pressure is 182/88 at this time. Notified teaching services.

## 2016-02-04 NOTE — Progress Notes (Signed)
Internal Medicine Attending:   I saw and examined the patient. I reviewed the resident's note and I agree with the resident's findings and plan as documented in the resident's note.  82 year old man with chronic ischemic lower extremity wounds admitted with soft tissue infection of the extensive wounds on his left side causing systemic illness. Patient has systemically responded well to IV fluid resuscitation and IV antibiotics. His wounds continue to have significant purulence and foul odor. The patient and his wife received two orthopedic opinions yesterday advising amputation, and our team again recommended amputation as the plan that is most likely to prolong his life. The patient wants to see how his wounds respond over the weekend to hydrotherapy and IV antibiotics. We also discussed the option of foregoing all care and instead focusing on comfort, knowing that he will likely pass away from infection associated with these wounds. The patient also declined to talk about this, he says he is not ready to die. We will continue IV fluid hydration, IV antibiotics, nutrition, PT, and hydrotherapy to the wounds for now. I do not anticipate we will see much improvement over the next few days, the wound tissue does not appear to be viable. I hope the patient will reconsider surgery early next week.

## 2016-02-04 NOTE — Consult Note (Addendum)
WOC consult requested for left leg wounds. Pt has wet gangrene and osteomyelitis, these complex medical conditions are beyond WOC scope of practice. Ortho service is now following for assessment and plan of care; amputation was recommended and they have ordered hydrotherapy, which is performed by the physical therapy team. Xeroform dressings ordered, as was the previous plan of care.  Topical treatment will not be effective to promote healing, according to consult notes.  Please refer to ortho service for further plan of care.   Please re-consult if further assistance is needed.  Thank-you,  Cammie Mcgeeawn Krishana Lutze MSN, RN, CWOCN, [place]-AP, CNS [phone]

## 2016-02-04 NOTE — Care Management Important Message (Signed)
Important Message  Patient Details  Name: Vincent PigeonRobert T Miranda MRN: [id number] Date of Birth: Dec 20, 1924   Medicare Important Message Given:  Yes    [name] 02/04/2016, 3:02 PM

## 2016-02-04 NOTE — Progress Notes (Signed)
Physical Therapy Wound Treatment Patient Details  Name: Vincent Miranda MRN: [id number] Date of Birth: [birth year]  Today's Date: 02/04/2016 Time: 7680-8811 Time Calculation (min): 57 min  Subjective  Subjective: Pt agreeable to hydrotherapy. Was not happy with his breakfast but was generally pleasant with therapist.  Patient and Family Stated Goals: Avoid amputation Date of Onset:  (Present on admission)  Pain Score:  Pt reports minimal pain during session. Was premedicated.   Wound Assessment  Wound / Incision (Open or Dehisced) 02/02/16 Other (Comment) Leg Left (Active)  Dressing Type ABD;Moist to dry;Gauze (Comment) 02/04/2016 10:02 AM  Dressing Changed Changed 02/04/2016 10:02 AM  Dressing Status Dry;Clean;Intact 02/04/2016 10:02 AM  Dressing Change Frequency Daily 02/04/2016 10:02 AM  Site / Wound Assessment Black;Brown;Pink;Yellow;Bleeding 02/04/2016 10:02 AM  % Wound base Red or Granulating 0% 02/04/2016 10:02 AM  % Wound base Yellow 70% 02/04/2016 10:02 AM  % Wound base Black 30% 02/04/2016 10:02 AM  Peri-wound Assessment Intact;Induration 02/04/2016 10:02 AM  Wound Length (cm) 8.5 cm 02/04/2016 10:02 AM  Wound Width (cm) 6 cm 02/04/2016 10:02 AM  Wound Depth (cm) 0.6 cm 02/04/2016 10:02 AM  Tunneling (cm) 0 02/04/2016 10:02 AM  Undermining (cm) up to 1.9 cm 6 o'clock to 12 o'clock 02/04/2016 10:02 AM  Margins Unattached edges (unapproximated) 02/04/2016 10:02 AM  Closure None 02/04/2016 10:02 AM  Drainage Amount Moderate 02/04/2016 10:02 AM  Drainage Description Purulent;Odor 02/04/2016 10:02 AM  Treatment Debridement (Selective);Hydrotherapy (Pulse lavage);Packing (Saline gauze) 02/04/2016 10:02 AM     Wound / Incision (Open or Dehisced) 02/02/16 Other (Comment) Foot Left;Anterior (Active)  Dressing Type ABD;Moist to dry;Gauze (Comment) 02/04/2016 10:02 AM  Dressing Changed Changed 02/04/2016 10:02 AM  Dressing Status Clean;Dry;Intact 02/04/2016 10:02 AM  Dressing Change Frequency  Daily 02/04/2016 10:02 AM  Site / Wound Assessment Black;Yellow;Brown;Pink 02/04/2016 10:02 AM  % Wound base Red or Granulating 60% 02/04/2016 10:02 AM  % Wound base Yellow 30% 02/04/2016 10:02 AM  % Wound base Black 10% 02/04/2016 10:02 AM  % Wound base Other (Comment) 0% 02/04/2016 10:02 AM  Peri-wound Assessment Intact;Bleeding 02/04/2016 10:02 AM  Wound Length (cm) 13.5 cm 02/04/2016 10:02 AM  Wound Width (cm) 6.5 cm 02/04/2016 10:02 AM  Wound Depth (cm) 0.5 cm 02/04/2016 10:02 AM  Tunneling (cm) 0 02/04/2016 10:02 AM  Undermining (cm) .7 cm 6-7 o'clock 02/04/2016 10:02 AM  Margins Unattached edges (unapproximated) 02/04/2016 10:02 AM  Drainage Amount Moderate 02/04/2016 10:02 AM  Drainage Description Purulent;Odor 02/04/2016 10:02 AM  Treatment Debridement (Selective);Hydrotherapy (Pulse lavage);Packing (Saline gauze) 02/04/2016 10:02 AM   Hydrotherapy Pulsed lavage therapy - wound location: L foot and lower leg Pulsed Lavage with Suction (psi): 8 psi Pulsed Lavage with Suction - Normal Saline Used: 1000 mL (for each separate wound) Pulsed Lavage Tip: Tip with splash shield Selective Debridement Selective Debridement - Location: L foot and lower leg Selective Debridement - Tools Used: Forceps;Scissors Selective Debridement - Tissue Removed: Black and yellow necrotic tissue   Wound Assessment and Plan  Wound Therapy - Assess/Plan/Recommendations Wound Therapy - Clinical Statement: Pt presents to hydrotherapy with a wound on the lower leg and on the medial foot. Pt will benefit from selective removal of non-viable tissue to promote healing of wound bed. Wound Therapy - Functional Problem List: Decreased tolerance of OOB Factors Delaying/Impairing Wound Healing: Immobility;Multiple medical problems Hydrotherapy Plan: Debridement;Dressing change;Patient/family education;Pulsatile lavage with suction Wound Therapy - Frequency: 6X / week Wound Therapy - Follow Up Recommendations: Skilled nursing  facility Wound Plan: See above  Wound Therapy Goals- Improve the function of patient's integumentary system by progressing the wound(s) through the phases of wound healing (inflammation - proliferation - remodeling) by: Decrease Necrotic Tissue to: 50 (L lower leg wound) Decrease Necrotic Tissue - Progress: Goal set today Increase Granulation Tissue to: 50 (L lower leg wound) Increase Granulation Tissue - Progress: Goal set today Goals/treatment plan/discharge plan were made with and agreed upon by patient/family: Yes Time For Goal Achievement: 7 days Wound Therapy - Potential for Goals: Fair  Goals will be updated until maximal potential achieved or discharge criteria met.  Discharge criteria: when goals achieved, discharge from hospital, MD decision/surgical intervention, no progress towards goals, refusal/missing three consecutive treatments without notification or medical reason.  GP     Rolinda Roan 02/04/2016, 12:22 PM   Rolinda Roan, PT, DPT Acute Rehabilitation Services Pager: [phone]

## 2016-02-05 DIAGNOSIS — E11621 Type 2 diabetes mellitus with foot ulcer: Secondary | ICD-10-CM

## 2016-02-05 DIAGNOSIS — L97519 Non-pressure chronic ulcer of other part of right foot with unspecified severity: Secondary | ICD-10-CM

## 2016-02-05 DIAGNOSIS — M86279 Subacute osteomyelitis, unspecified ankle and foot: Secondary | ICD-10-CM | POA: Insufficient documentation

## 2016-02-05 DIAGNOSIS — N189 Chronic kidney disease, unspecified: Secondary | ICD-10-CM

## 2016-02-05 DIAGNOSIS — L97529 Non-pressure chronic ulcer of other part of left foot with unspecified severity: Secondary | ICD-10-CM

## 2016-02-05 DIAGNOSIS — D631 Anemia in chronic kidney disease: Secondary | ICD-10-CM

## 2016-02-05 LAB — CBC
HEMATOCRIT: 23.5 % — AB (ref 39.0–52.0)
Hemoglobin: 7.1 g/dL — ABNORMAL LOW (ref 13.0–17.0)
MCH: 25.4 pg — ABNORMAL LOW (ref 26.0–34.0)
MCHC: 30.2 g/dL (ref 30.0–36.0)
MCV: 83.9 fL (ref 78.0–100.0)
Platelets: 335 10*3/uL (ref 150–400)
RBC: 2.8 MIL/uL — ABNORMAL LOW (ref 4.22–5.81)
RDW: 15 % (ref 11.5–15.5)
WBC: 13 10*3/uL — AB (ref 4.0–10.5)

## 2016-02-05 LAB — BASIC METABOLIC PANEL
Anion gap: 7 (ref 5–15)
BUN: 24 mg/dL — AB (ref 6–20)
CHLORIDE: 119 mmol/L — AB (ref 101–111)
CO2: 22 mmol/L (ref 22–32)
Calcium: 9.1 mg/dL (ref 8.9–10.3)
Creatinine, Ser: 2.06 mg/dL — ABNORMAL HIGH (ref 0.61–1.24)
GFR calc Af Amer: 31 mL/min — ABNORMAL LOW (ref >60)
GFR calc non Af Amer: 27 mL/min — ABNORMAL LOW (ref >60)
GLUCOSE: 103 mg/dL — AB (ref 65–99)
POTASSIUM: 3.5 mmol/L (ref 3.5–5.1)
Sodium: 148 mmol/L — ABNORMAL HIGH (ref 135–145)

## 2016-02-05 NOTE — Progress Notes (Signed)
Physical Therapy Wound Treatment Patient Details  Name: Vincent Miranda MRN: [id number] Date of Birth: [birth year]  Today's Date: 02/05/2016 Time: 11:38-12:15  Subjective  Subjective: Pt agreeable to hydrotherapy. No complaints during session. Patient and Family Stated Goals: Avoid amputation Date of Onset:  (present on admission)  Pain Score: Pain Score: 0-No pain  Wound Assessment     Wound / Incision (Open or Dehisced) 02/02/16 Other (Comment) Leg Left (Active)  Dressing Type ABD;Gauze (Comment);Moist to dry;Tape dressing 02/05/2016 12:52 PM  Dressing Changed Changed 02/05/2016 12:52 PM  Dressing Status Clean;Dry;Intact 02/05/2016 12:52 PM  Dressing Change Frequency Daily 02/05/2016 12:52 PM  Site / Wound Assessment Black;Brown;Pink;Yellow;Bleeding 02/05/2016 12:52 PM  % Wound base Red or Granulating 0% 02/05/2016 12:52 PM  % Wound base Yellow 70% 02/05/2016 12:52 PM  % Wound base Black 30% 02/05/2016 12:52 PM  Peri-wound Assessment Intact;Induration 02/05/2016 12:52 PM  Wound Length (cm) 8.5 cm 02/04/2016 10:02 AM  Wound Width (cm) 6 cm 02/04/2016 10:02 AM  Wound Depth (cm) 0.6 cm 02/04/2016 10:02 AM  Tunneling (cm) 0 02/04/2016 10:02 AM  Undermining (cm) up to 1.9 cm 6 o'clock to 12 o'clock 02/04/2016 10:02 AM  Margins Unattached edges (unapproximated) 02/05/2016 12:52 PM  Closure None 02/05/2016 12:52 PM  Drainage Amount Moderate 02/05/2016 12:52 PM  Drainage Description Purulent;Odor 02/05/2016 12:52 PM  Treatment Debridement (Selective);Hydrotherapy (Pulse lavage);Packing (Saline gauze) 02/05/2016 12:52 PM     Wound / Incision (Open or Dehisced) 02/02/16 Other (Comment) Foot Left;Anterior (Active)  Dressing Type ABD;Gauze (Comment);Moist to dry;Tape dressing 02/05/2016 12:52 PM  Dressing Changed Changed 02/05/2016 12:52 PM  Dressing Status Clean;Dry;Intact 02/05/2016 12:52 PM  Dressing Change Frequency Daily 02/05/2016 12:52 PM  Site / Wound Assessment Black;Yellow;Brown;Pink 02/05/2016  12:52 PM  % Wound base Red or Granulating 60% 02/05/2016 12:52 PM  % Wound base Yellow 30% 02/05/2016 12:52 PM  % Wound base Black 10% 02/05/2016 12:52 PM  % Wound base Other (Comment) 0% 02/05/2016 12:52 PM  Peri-wound Assessment Intact;Bleeding 02/05/2016 12:52 PM  Wound Length (cm) 13.5 cm 02/04/2016 10:02 AM  Wound Width (cm) 6.5 cm 02/04/2016 10:02 AM  Wound Depth (cm) 0.5 cm 02/04/2016 10:02 AM  Tunneling (cm) 0 02/04/2016 10:02 AM  Undermining (cm) .7 cm 6-7 o'clock 02/04/2016 10:02 AM  Margins Unattached edges (unapproximated) 02/05/2016 12:52 PM  Closure None 02/05/2016 12:52 PM  Drainage Amount Moderate 02/05/2016 12:52 PM  Drainage Description Purulent;Odor 02/05/2016 12:52 PM  Treatment Debridement (Selective);Hydrotherapy (Pulse lavage);Packing (Saline gauze) 02/05/2016 12:52 PM   Hydrotherapy Pulsed lavage therapy - wound location: L foot and lower leg Pulsed Lavage with Suction (psi): 8 psi Pulsed Lavage with Suction - Normal Saline Used: 2000 mL (1067m for each wound) Pulsed Lavage Tip: Tip with splash shield Selective Debridement Selective Debridement - Location: L foot and lower leg Selective Debridement - Tools Used: Forceps;Scissors Selective Debridement - Tissue Removed: Black and yellow necrotic tissue   Wound Assessment and Plan  Wound Therapy - Assess/Plan/Recommendations Wound Therapy - Clinical Statement: Wounds appear undchanged from last hydrotherapy session. Wound Therapy - Functional Problem List: Decreased tolerance of OOB Factors Delaying/Impairing Wound Healing: Immobility;Multiple medical problems Hydrotherapy Plan: Debridement;Dressing change;Patient/family education;Pulsatile lavage with suction Wound Therapy - Frequency: 6X / week Wound Therapy - Follow Up Recommendations: Skilled nursing facility Wound Plan: See above  Wound Therapy Goals- Improve the function of patient's integumentary system by progressing the wound(s) through the phases of wound  healing (inflammation - proliferation - remodeling) by: Decrease Necrotic Tissue to: 50 (left  lower leg wound) Decrease Necrotic Tissue - Progress: Progressing toward goal (slowly) Increase Granulation Tissue to: 50 (left leg wound) Increase Granulation Tissue - Progress: Progressing toward goal (slowly) Goals/treatment plan/discharge plan were made with and agreed upon by patient/family: Yes Time For Goal Achievement: 7 days Wound Therapy - Potential for Goals: Fair  Goals will be updated until maximal potential achieved or discharge criteria met.  Discharge criteria: when goals achieved, discharge from hospital, MD decision/surgical intervention, no progress towards goals, refusal/missing three consecutive treatments without notification or medical reason.  GP     Willow Ora 02/05/2016, 1:00 PM  Willow Ora, PTA, CLT Acute Rehab Services [phone] 02/05/2016, 1:02 PM

## 2016-02-05 NOTE — Progress Notes (Signed)
   Subjective: No complaints this morning .Eating breakfast. Still stating he does not want to have his foot amputated.   Objective: Vital signs in last 24 hours: Filed Vitals:   02/04/16 1448 02/04/16 1800 02/04/16 2131 02/05/16 0430  BP: 184/78  182/88 156/83  Pulse: 72  79 93  Temp: 97.9 F (36.6 C)  98 F (36.7 C) 97.5 F (36.4 C)  TempSrc: Oral  Oral Oral  Resp: 18 16 18 18   Height:      Weight:    157 lb 13.6 oz (71.6 kg)  SpO2: 97% 99% 96% 94%   Weight change: 3.5 oz (0.1 kg)  Intake/Output Summary (Last 24 hours) at 02/05/16 1107 Last data filed at 02/05/16 [id number]  Gross per 24 hour  Intake    360 ml  Output    850 ml  Net   -490 ml   GENERAL- chronically ill appearing elderly man, co-operative, NAD CARDIAC- RRR, no murmurs, rubs or gallops. RESP- faint wheezing b/l.  EXTREMITIES- both feet in dry white dressings SKIN- Warm, dry PSYCH- alert, pleasant, conversant  Medications: I have reviewed the patient's current medications. Scheduled Meds: . allopurinol  100 mg Oral BID AC  . enoxaparin (LOVENOX) injection  30 mg Subcutaneous Q24H  . feeding supplement  1 Container Oral BID BM  . feeding supplement (PRO-STAT SUGAR FREE 64)  30 mL Oral BID  . piperacillin-tazobactam (ZOSYN)  IV  3.375 g Intravenous Q8H  . vancomycin  750 mg Intravenous Q24H   Continuous Infusions: . dextrose 5 % and 0.45% NaCl 100 mL/hr at 02/04/16 2141   Assessment/Plan: Left lower leg ulcer: Chronic leg ulceration now with infection that will probably never resolve without amputation due to his chronic peripheral vascular disease. Still refusing amputation.  -continue empiric abtx coverage vanc/zosyn 3/15>>>> -F/U Blood Cx from 3/15>>> NGTD -continue Hydrotherapy -ortho following, available for emergent sx if needed. Dr. Lajoyce Cornersuda to f/u with patient on Monday  Chronic kidney disease: At baseline function. Dehydrated on clinical exam with sodium 147. Will replace fluids since his PO intake  is mediocre and significant IV medication salt contents. -IVF D5%1/2NS  Anemia of chronic disease: Hgb in 7s. Stable. Significant CKD. No intervention indicated, no iron deficiency.  - CBC in the am, may need EPO agent  Diet: Regular VTE ppx: [place] enoxaparin FULL CODE  Dispo: Disposition is deferred at this time, awaiting improvement of current medical problems.  The patient does not have a current PCP (No Pcp Per Patient) and [facility] after discharge. The patient does have transportation limitations that hinder transportation to clinic appointments.   LOS: 3 days   Denton Brickiana M Easton Fetty, MD 02/05/2016, 11:07 AM

## 2016-02-05 NOTE — Progress Notes (Signed)
Pharmacy Antibiotic Note  Vincent PigeonRobert T Miranda is a 82 y.o. male admitted on 02/02/2016 with cellulitis.  Pharmacy has been consulted for vanc/zosyn dosing.   Plan: Vanc 750mg  IV q24h Zosyn 3.375g IV q8h (4h inf) Monitor clinical progress, c/s, renal function, abx plan/LOT VT@SS  as indicated  Height: 5\' 11"  (180.3 cm) Weight: 157 lb 13.6 oz (71.6 kg) IBW/kg (Calculated) : 75.3  Temp (24hrs), Avg:97.8 F (36.6 C), Min:97.5 F (36.4 C), Max:98 F (36.7 C)   Recent Labs Lab 02/02/16 1140 02/02/16 1339 02/02/16 1420 02/02/16 1430 02/03/16 0654 02/04/16 0713 02/05/16 0636  WBC  --   --  24.1*  --  18.3* 13.2* 13.0*  CREATININE  --  1.77*  --   --  2.08* 2.05* 2.06*  LATICACIDVEN 3.94*  --   --  2.27* 0.9  --   --     Estimated Creatinine Clearance: 24.6 mL/min (by C-G formula based on Cr of 2.06).    No Known Allergies  Antimicrobials this admission: 3/15 vanc >> 3/15 zosyn >>   Dose adjustments this admission: n/a  Microbiology results: 3/15 urine - neg 3/15 bcx - ngtd 3/15 MRSA screen - neg 3/15 Hep C - neg 3/15 HIV - neg  Vincent Miranda D. Chanceler Pullin, PharmD, BCPS Clinical Pharmacist Pager: [phone] 02/05/2016 2:38 PM

## 2016-02-05 NOTE — Progress Notes (Signed)
Patient ID: Vincent PigeonRobert T Miranda, male   DOB: 26-Dec-1924, 82 y.o.   MRN: [id number] Medicine attending: I examined this patient this morning together with resident physician Dr. Gara Kroneriana Truong and I concur with her evaluation and management plan which we discussed together. Patient being treated for bilateral diabetic foot ulcers which are nonhealing. He is adamantly opposed to amputation. He understands after we discussed it with him that we may not be able to heal this infection with antibiotics alone. Lung exam with diffuse expiratory wheezes. He is in no respiratory distress. We will continue current antibiotic therapy. Hydrotherapy initiated in attempt to accelerate wound healing. Orthopedic surgery following closely.

## 2016-02-05 NOTE — Progress Notes (Signed)
Available for emergent bka if needed over weekend - o/w Dr Lajoyce Cornersuda will pick back up on Monday

## 2016-02-06 NOTE — Progress Notes (Addendum)
   Subjective: Eating breakfast this morning without any new complaints.   Objective: Vital signs in last 24 hours: Filed Vitals:   02/05/16 0430 02/05/16 1528 02/05/16 2112 02/06/16 0429  BP: 156/83 177/73 174/89 166/85  Pulse: 93 69 73 69  Temp: 97.5 F (36.4 C) 98.3 F (36.8 C) 99 F (37.2 C) 97.9 F (36.6 C)  TempSrc: Oral Oral Oral Oral  Resp: '18 16 16 16  '$ Height:      Weight: 71.6 kg (157 lb 13.6 oz)     SpO2: 94% 97% 94% 94%   Weight change:   Intake/Output Summary (Last 24 hours) at 02/06/16 0840 Last data filed at 02/05/16 1345  Gross per 24 hour  Intake    120 ml  Output    150 ml  Net    -30 ml   GENERAL- chronically ill appearing elderly man, co-operative, NAD CARDIAC- RRR, no murmurs, rubs or gallops. RESP- faint wheezing b/l.  EXTREMITIES- both feet in dry white dressings SKIN- Warm, dry PSYCH- alert, pleasant, conversant  Medications: I have reviewed the patient's current medications. Scheduled Meds: . allopurinol  100 mg Oral BID AC  . enoxaparin (LOVENOX) injection  30 mg Subcutaneous Q24H  . feeding supplement  1 Container Oral BID BM  . feeding supplement (PRO-STAT SUGAR FREE 64)  30 mL Oral BID  . piperacillin-tazobactam (ZOSYN)  IV  3.375 g Intravenous Q8H  . vancomycin  750 mg Intravenous Q24H   Continuous Infusions: . dextrose 5 % and 0.45% NaCl 100 mL/hr at 02/04/16 2141   Assessment/Plan: Left lower leg ulcer: Chronic leg ulceration now with infection that will probably never resolve without amputation due to his chronic peripheral vascular disease. Still refusing amputation. ESR >140. -continue empiric abtx coverage vanc/zosyn 3/15>>>> -Will address empiric coverage choice tmrw if deciding continued medical and wound care management (due to patient wishes) -F/U Blood Cx from 3/15>>> NGTD -continue Hydrotherapy -May plan place PICC if needed tmrw -ortho following, available for emergent sx if needed. Dr. Sharol Given to f/u with  patient  Chronic kidney disease: At baseline function. Appears chronically dehydrated on clinical exam with sodium 148. Large salt load in IV mediations. He is on lasix PTA and likely some of his hypertension at this time is from withholding this medicine and giving IV fluids.  Anemia of chronic disease: Hgb in 7s. Stable. Significant CKD. No immediate intervention indicated, no iron deficiency, no active bleeding.  - May need EPO agent - Limit phlebotomy  Diet: Regular VTE ppx: [place] enoxaparin FULL CODE  Dispo: Disposition is deferred at this time, awaiting improvement of current medical problems. Will decide on direction of therapy going into the next weeks in the next 1-2 days.  The patient does not have a current PCP (No Pcp Per Patient) and does need an [facility] hospital follow-up appointment after discharge. The patient does have transportation limitations that hinder transportation to clinic appointments.   LOS: 4 days   Collier Salina, MD 02/06/2016, 8:40 AM

## 2016-02-07 ENCOUNTER — Inpatient Hospital Stay (HOSPITAL_COMMUNITY): Payer: Medicare Other

## 2016-02-07 DIAGNOSIS — M86672 Other chronic osteomyelitis, left ankle and foot: Secondary | ICD-10-CM

## 2016-02-07 LAB — BASIC METABOLIC PANEL
ANION GAP: 7 (ref 5–15)
BUN: 17 mg/dL (ref 6–20)
CHLORIDE: 118 mmol/L — AB (ref 101–111)
CO2: 22 mmol/L (ref 22–32)
Calcium: 9.1 mg/dL (ref 8.9–10.3)
Creatinine, Ser: 1.98 mg/dL — ABNORMAL HIGH (ref 0.61–1.24)
GFR calc Af Amer: 33 mL/min — ABNORMAL LOW (ref >60)
GFR calc non Af Amer: 28 mL/min — ABNORMAL LOW (ref >60)
GLUCOSE: 95 mg/dL (ref 65–99)
POTASSIUM: 3.5 mmol/L (ref 3.5–5.1)
Sodium: 147 mmol/L — ABNORMAL HIGH (ref 135–145)

## 2016-02-07 LAB — CULTURE, BLOOD (ROUTINE X 2)
CULTURE: NO GROWTH
CULTURE: NO GROWTH

## 2016-02-07 LAB — CBC
HEMATOCRIT: 25 % — AB (ref 39.0–52.0)
HEMOGLOBIN: 7.5 g/dL — AB (ref 13.0–17.0)
MCH: 25.3 pg — AB (ref 26.0–34.0)
MCHC: 30 g/dL (ref 30.0–36.0)
MCV: 84.5 fL (ref 78.0–100.0)
Platelets: 319 10*3/uL (ref 150–400)
RBC: 2.96 MIL/uL — ABNORMAL LOW (ref 4.22–5.81)
RDW: 15.3 % (ref 11.5–15.5)
WBC: 11.2 10*3/uL — ABNORMAL HIGH (ref 4.0–10.5)

## 2016-02-07 MED ORDER — SODIUM CHLORIDE 0.9% FLUSH
10.0000 mL | INTRAVENOUS | Status: DC | PRN
  Administered 2016-02-08: 10 mL
  Filled 2016-02-07: qty 40

## 2016-02-07 NOTE — Progress Notes (Signed)
Peripherally Inserted Central Catheter/Midline Placement  The IV Nurse has discussed with the patient and/or persons authorized to consent for the patient, the purpose of this procedure and the potential benefits and risks involved with this procedure.  The benefits include less needle sticks, lab draws from the catheter and patient may be discharged home with the catheter.  Risks include, but not limited to, infection, bleeding, blood clot (thrombus formation), and puncture of an artery; nerve damage and irregular heat beat.  Alternatives to this procedure were also discussed.  PICC/Midline Placement Documentation         Chief Walkup, Lajean ManesKerry Loraine 02/07/2016, 5:26 PM

## 2016-02-07 NOTE — Progress Notes (Signed)
PT Cancellation Note  Patient Details Name: Hope PigeonRobert T Jurczyk MRN: [id number] DOB: 06-27-1925   Cancelled Treatment:    Reason Eval/Treat Not Completed: Other (comment)    Adamantly refusing OOB activity and walking, citing pain post Hydrotherapy (approx 2 hours prior);  He is also telling me he does not want me to reattempt PT session today;   Gave my best effort at educating pt on the effects of bedrest, and the rationale to see how he moves here at this point to be able to make recommendations to make dc home better, easier, and safer for him;  I am hopeful he will participate tomorrow -- [facility] to [facility];   If he does not walk and participate, I will have no way to gauge his ability to manage at home, and would consider SNF for post acute rehab our best option;  However, it is likely he will refuse SNF;   My best recommendation given his limited mobility at this time for home would be a wheelchair, and maximizing home health services -- HHPT/OT/Aide/RN  Van ClinesHolly Janah Mcculloh, PT  Acute Rehabilitation Services Pager [phone] Office [phone]    Van ClinesGarrigan, [place] [facility] 02/07/2016, 11:33 AM

## 2016-02-07 NOTE — Care Management Important Message (Signed)
Important Message  Patient Details  Name: Vincent PigeonRobert T Miranda MRN: [id number] Date of Birth: 1925-09-22   Medicare Important Message Given:  Yes    Kush Farabee P Giavonni Fonder 02/07/2016, 3:58 PM

## 2016-02-07 NOTE — Care Management Note (Signed)
Case Management Note  Patient Details  Name: Diontre T Gillum MRN: [id number] Date of Birth: Aug 26, 1925  Subjective/Objective:   82 yr old male admitted with lower extremity cellulitis.  Action/Plan: Case manager has spoken with patient and wife at length on 3/18 and 02/07/16 concerniing medical condition and needs for home health. Patient is adamant that he will not have amputation. Has been care for by [facility] previously and they will do so now. Case manager contacted Josph MachoSusan Hamilton, [facility] Specialist concerning patient.    Expected Discharge Date:    02/08/16              Expected Discharge Plan:   home with Home health  In-House Referral:  Clinical Social Work  Discharge planning Services  CM Consult  Post Acute Care Choice:  Resumption of Svcs/PTA Provider Choice offered to:   patient and wife  DME Arranged:   hospital bed DME Agency:   [facility]  HH Arranged:   RN/PT IV antibiotics HH Agency:   [facility]  Status of Service:  In process, will continue to follow  Medicare Important Message Given:  Yes Date Medicare IM Given:    Medicare IM give by:    Date Additional Medicare IM Given:    Additional Medicare Important Message give by:     If discussed at Long Length of Stay Meetings, dates discussed:    Additional Comments:  Durenda GuthrieBrady, Vimal Derego Naomi, RN 02/07/2016, 10:32 AM

## 2016-02-07 NOTE — Progress Notes (Signed)
Internal Medicine Attending:   I saw and examined the patient. I reviewed the resident's note and I agree with the resident's findings and plan as documented in the resident's note.  Difficult case of an 82 year old who has chronic ischemic bilateral lower extremity extensive ulcers for which he's been doing supportive care at home and wound clinic. He was admitted to our service on 3/15 with sepsis due to infection of the left lower extremity wounds and has known chronic osteomyelitis of the left foot. He has received at least 2 surgical opinions recommending amputation of the left leg as the optimal treatment. I asked the patient again today and he again declines amputation. He says that the wounds have already improved with just the IV antibiotics and he feels better. He is hopeful that the wounds will heal on their own. I advised him that in my opinion, and his wound specialist's opinions, they will worsen in the future even with antibiotics. He's resistant to this idea, tells me that we can't know this for sure; he seems to have made up his mind that he will not accept surgery on his left leg.  As an alternative option I think we can offer him an extended IV antibiotic course to treat the soft tissue infection of the left lower extremity. We have no culture data to help guide our antibiotic selection in this case. The wounds are likely polymicrobial and he has risk factors for multidrug resistant organisms given his past exposures to IV antibiotics as well as healthcare facilities. Our plan would be to transition from Vanc/Zosyn to single therapy with ertapenem for broad-spectrum gram-negative, gram-positive, and anaerobic coverage. I would plan for 2 weeks of this therapy. I advised the patient this will not clear the chronic infection of his bone, but likely will temporize the chronic soft tissue ulcers. It is not the ideal treatment plan, but it is the only one he will accept. We offered him placement  in skilled nursing facility for IV antibiotics and wound care, however the patient declined. Says he has been to rehabilitation before and does not want to go back again. We offered him a hospital bed for his home as I think he will be more disabled than he anticipates, and he has reluctantly accepted. We will plan to place a PICC line, plan for 2 weeks of broad-spectrum IV antibiotics, will need follow-up in wound clinic.

## 2016-02-07 NOTE — Progress Notes (Signed)
Physical Therapy Wound Treatment Patient Details  Name: Vincent Miranda MRN: [id number] Date of Birth: 1925-03-31  Today's Date: 02/07/2016 Time: 4917-9150 Time Calculation (min): 40 min  Subjective  Subjective: Pt agreeable to hydrotherapy Patient and Family Stated Goals: Avoid amputation Date of Onset:  (Present on admission)  Pain Score:  Pt was premedicated.  Wound Assessment  Wound / Incision (Open or Dehisced) 02/02/16 Other (Comment) Leg Left (Active)  Dressing Type Compression wrap 02/07/2016  9:38 AM  Dressing Changed Changed 02/07/2016  9:38 AM  Dressing Status Clean;Dry;Intact 02/07/2016  9:38 AM  Dressing Change Frequency Daily 02/07/2016  9:38 AM  Site / Wound Assessment Pink;Black;Dusky;Bleeding 02/07/2016  9:38 AM  % Wound base Red or Granulating 60% 02/07/2016  9:38 AM  % Wound base Yellow 20% 02/07/2016  9:38 AM  % Wound base Black 20% 02/07/2016  9:38 AM  Peri-wound Assessment Intact;Induration 02/07/2016  9:38 AM  Wound Length (cm) 8.5 cm 02/04/2016 10:02 AM  Wound Width (cm) 6 cm 02/04/2016 10:02 AM  Wound Depth (cm) 0.6 cm 02/04/2016 10:02 AM  Tunneling (cm) 0 02/04/2016 10:02 AM  Undermining (cm) up to 1.9 cm 6 o'clock to 12 o'clock 02/04/2016 10:02 AM  Margins Unattached edges (unapproximated) 02/07/2016  9:38 AM  Closure None 02/07/2016  9:38 AM  Drainage Amount Moderate 02/07/2016  9:38 AM  Drainage Description Odor;Purulent 02/07/2016  9:38 AM  Treatment Debridement (Selective);Hydrotherapy (Pulse lavage);Packing (Saline gauze) 02/07/2016  9:38 AM     Wound / Incision (Open or Dehisced) 02/02/16 Other (Comment) Foot Left;Anterior (Active)  Dressing Type Compression wrap 02/07/2016  9:38 AM  Dressing Changed Changed 02/07/2016  9:38 AM  Dressing Status Clean;Dry;Intact 02/07/2016  9:38 AM  Dressing Change Frequency Daily 02/07/2016  9:38 AM  Site / Wound Assessment Pink;Yellow;Brown;Dusky 02/07/2016  9:38 AM  % Wound base Red or Granulating 75% 02/07/2016  9:38 AM  %  Wound base Yellow 25% 02/07/2016  9:38 AM  % Wound base Black 10% 02/05/2016 12:52 PM  % Wound base Other (Comment) 0% 02/05/2016 12:52 PM  Peri-wound Assessment Intact 02/07/2016  9:38 AM  Wound Length (cm) 13.5 cm 02/04/2016 10:02 AM  Wound Width (cm) 6.5 cm 02/04/2016 10:02 AM  Wound Depth (cm) 0.5 cm 02/04/2016 10:02 AM  Tunneling (cm) 0 02/04/2016 10:02 AM  Undermining (cm) .7 cm 6-7 o'clock 02/04/2016 10:02 AM  Margins Unattached edges (unapproximated) 02/07/2016  9:38 AM  Closure None 02/07/2016  9:38 AM  Drainage Amount Moderate 02/07/2016  9:38 AM  Drainage Description Purulent;Odor 02/07/2016  9:38 AM  Treatment Debridement (Selective);Hydrotherapy (Pulse lavage);Packing (Saline gauze) 02/07/2016  9:38 AM   Hydrotherapy Pulsed lavage therapy - wound location: L foot and lower leg Pulsed Lavage with Suction (psi): 8 psi Pulsed Lavage with Suction - Normal Saline Used: 2000 mL (1000 mL for each wound) Pulsed Lavage Tip: Tip with splash shield Selective Debridement Selective Debridement - Location: L foot and lower leg Selective Debridement - Tools Used: Forceps;Scissors Selective Debridement - Tissue Removed: Black and yellow necrotic tissue   Wound Assessment and Plan  Wound Therapy - Assess/Plan/Recommendations Wound Therapy - Clinical Statement: Progressing with removal of non-viable necrotic tissue Wound Therapy - Functional Problem List: Decreased tolerance of OOB Factors Delaying/Impairing Wound Healing: Immobility;Multiple medical problems Hydrotherapy Plan: Debridement;Dressing change;Patient/family education;Pulsatile lavage with suction Wound Therapy - Frequency: 6X / week Wound Therapy - Follow Up Recommendations: Skilled nursing facility Wound Plan: See above  Wound Therapy Goals- Improve the function of patient's integumentary system by progressing the  wound(s) through the phases of wound healing (inflammation - proliferation - remodeling) by: Decrease Necrotic Tissue to:  50 (L lower leg wound) Decrease Necrotic Tissue - Progress: Progressing toward goal Increase Granulation Tissue to: 50 (L lower leg wound) Increase Granulation Tissue - Progress: Progressing toward goal Goals/treatment plan/discharge plan were made with and agreed upon by patient/family: Yes Time For Goal Achievement: 7 days Wound Therapy - Potential for Goals: Fair  Goals will be updated until maximal potential achieved or discharge criteria met.  Discharge criteria: when goals achieved, discharge from hospital, MD decision/surgical intervention, no progress towards goals, refusal/missing three consecutive treatments without notification or medical reason.  GP     Rolinda Roan 02/07/2016, 9:47 AM   Rolinda Roan, PT, DPT Acute Rehabilitation Services Pager: [phone]

## 2016-02-07 NOTE — Progress Notes (Signed)
Subjective: Did not seem to be in significant pain after hydrotherapy this morning but was unable to tolerate out of bed activity or participate with therapy. After extensive conversation with patient and his wife again wanting continued antibiotics and wound care as the only acceptable treatment course, we are now making arrangements for PICC placement and home health services.  Objective: Vital signs in last 24 hours: Filed Vitals:   02/06/16 2000 02/07/16 0435 02/07/16 0441 02/07/16 1312  BP: 163/86 188/89 116/65 177/88  Pulse: 77 79 72 81  Temp: 98.9 F (37.2 C) 98.1 F (36.7 C) 97.4 F (36.3 C) 98.7 F (37.1 C)  TempSrc: Oral Oral Oral Oral  Resp: _0 Height:      Weight:      SpO2: 95% 95% 100% 97%   Weight change:   Intake/Output Summary (Last 24 hours) at 02/07/16 1324 Last data filed at 02/07/16 0909  Gross per 24 hour  Intake    240 ml  Output    250 ml  Net    -10 ml   GENERAL- chronically ill appearing elderly man, co-operative, NAD CARDIAC- RRR, no murmurs, rubs or gallops. RESP- CTAB, no wheezes or crackles EXTREMITIES- both feet in dry white dressings SKIN- Warm, dry, poor skin turgor PSYCH- pleasant, conversant  Medications: I have reviewed the patient's current medications. Scheduled Meds: . allopurinol  100 mg Oral BID AC  . enoxaparin (LOVENOX) injection  30 mg Subcutaneous Q24H  . feeding supplement  1 Container Oral BID BM  . feeding supplement (PRO-STAT SUGAR FREE 64)  30 mL Oral BID  . piperacillin-tazobactam (ZOSYN)  IV  3.375 g Intravenous Q8H  . vancomycin  750 mg Intravenous Q24H   Continuous Infusions: . dextrose 5 % and 0.45% NaCl 100 mL/hr at 02/04/16 2141   Assessment/Plan: Left lower leg ulcer: Chronic leg ulceration now with infection that will probably never resolve without amputation due to his chronic peripheral vascular disease. Still refusing amputation. ESR >140. The only treatment he or his wife are agreeable to is  long duration IV antibiotics at home. No surgery or rehab placement. No conversation about palliative care. He has not walked with physical therapy here and refuses to do so, but he and his wife attest he is mobile at home and has a walker if needed. In conversation they describe to me extensive wall-walking and wearing oversized shoes to accommodate his foot dressings which sounds rather unsafe but they absolutely do not want to walk with PT here, SNF rehab, or a wheelchair. Despite counseling on several occasions he might benefit from these interventions. -Adamantly refuses surgery or SNF placement -change vanc/zosyn 3/15>>ertapenem through PICC x2wks after discharge -continue Hydrotherapy -IV team consulted for PICC -Home health services -Will arrange follow up as he lost PCP (Dr. Kennon Holter) several months ago  Anemia of chronic disease: Hgb in 7s. Stable. Significant CKD. No immediate intervention indicated, no iron deficiency, no active bleeding. - Limiting phlebotomy  Chronic kidney disease: At baseline function.  Diet: Regular VTE ppx: [place] enoxaparin FULL CODE  Dispo: Disposition is deferred at this time, awaiting improvement of current medical problems. Will decide on direction of therapy going into the next weeks in the next 0-1 days.  The patient does not have a current PCP (No Pcp Per Patient) and does need an [facility] hospital follow-up appointment after discharge. The patient does have transportation limitations that hinder transportation to clinic appointments.   LOS: 5 days   Harrell Gave  W Rice, MD 02/07/2016, 1:24 PM  

## 2016-02-08 ENCOUNTER — Telehealth: Payer: Self-pay | Admitting: Internal Medicine

## 2016-02-08 MED ORDER — FUROSEMIDE 40 MG PO TABS: 40.0000 mg | ORAL_TABLET | Freq: Every day | ORAL | Status: DC

## 2016-02-08 MED ORDER — SODIUM CHLORIDE 0.9 % IV SOLN: 500.0000 mg | INTRAVENOUS | Status: AC

## 2016-02-08 MED ORDER — HEPARIN SOD (PORK) LOCK FLUSH 100 UNIT/ML IV SOLN
250.0000 [IU] | INTRAVENOUS | Status: AC | PRN
  Administered 2016-02-08: 250 [IU]

## 2016-02-08 MED ORDER — ALLOPURINOL 100 MG PO TABS: 100.0000 mg | ORAL_TABLET | Freq: Two times a day (BID) | ORAL | Status: DC

## 2016-02-08 NOTE — Discharge Instructions (Addendum)
You are being discharged with ERTAPENEM 500MG  ONCE DAILY INFUSION. This should continue daily for 14 days and finish April 4.  You had some significant dehydration during this hospital stay so you should decrease your home furosemide(Lasix) until your hospital follow up. If you start having worsening leg swelling or shortness of breath please call sooner.  Please work with home health nurse and therapy as much as you can because I want to maximize your improvement and minimize the risk of a bad fall or worsening infection. You are at high risk due to the persistent leg ulcers that are unlikely to fully heal despite prolonged IV antibiotics.  You have a follow up appointment with [facility] on the ground floor in one week March 28.

## 2016-02-08 NOTE — Progress Notes (Signed)
Physical Therapy Wound Treatment Patient Details  Name: Vincent Miranda MRN: [id number] Date of Birth: 18-May-1925  Today's Date: 02/08/2016 Time: 1100-1140 Time Calculation (min): 40 min  Subjective  Subjective: Pt agreeable to hydrotherapy Patient and Family Stated Goals: Avoid amputation Date of Onset:  (Present on admission)  Pain Score: Pt was premedicated and fell asleep during treatment.   Wound Assessment  Wound / Incision (Open or Dehisced) 02/02/16 Other (Comment) Leg Left (Active)  Dressing Type Compression wrap 02/08/2016  1:35 PM  Dressing Changed Changed 02/08/2016  1:35 PM  Dressing Status Clean;Dry;Intact 02/08/2016  1:35 PM  Dressing Change Frequency Daily 02/08/2016  1:35 PM  Site / Wound Assessment Red;Pink;Black;Bleeding 02/08/2016  1:35 PM  % Wound base Red or Granulating 75% 02/08/2016  1:35 PM  % Wound base Yellow 20% 02/08/2016  1:35 PM  % Wound base Black 5% 02/08/2016  1:35 PM  Peri-wound Assessment Intact;Induration 02/08/2016  1:35 PM  Wound Length (cm) 8.5 cm 02/04/2016 10:02 AM  Wound Width (cm) 6 cm 02/04/2016 10:02 AM  Wound Depth (cm) 0.6 cm 02/04/2016 10:02 AM  Tunneling (cm) 0 02/04/2016 10:02 AM  Undermining (cm) up to 1.9 cm 6 o'clock to 12 o'clock 02/04/2016 10:02 AM  Margins Unattached edges (unapproximated) 02/08/2016  1:35 PM  Closure None 02/08/2016  1:35 PM  Drainage Amount Moderate 02/08/2016  1:35 PM  Drainage Description Odor;Purulent 02/08/2016  1:35 PM  Treatment Debridement (Selective);Hydrotherapy (Pulse lavage);Packing (Saline gauze) 02/08/2016  1:35 PM     Wound / Incision (Open or Dehisced) 02/02/16 Other (Comment) Foot Left;Anterior (Active)  Dressing Type Compression wrap 02/08/2016  1:35 PM  Dressing Changed Changed 02/08/2016  1:35 PM  Dressing Status Clean;Dry;Intact 02/08/2016  1:35 PM  Dressing Change Frequency Daily 02/08/2016  1:35 PM  Site / Wound Assessment Pink;Yellow;Brown 02/08/2016  1:35 PM  % Wound base Red or Granulating 75%  02/08/2016  1:35 PM  % Wound base Yellow 25% 02/08/2016  1:35 PM  % Wound base Black 0% 02/08/2016  1:35 PM  % Wound base Other (Comment) 0% 02/05/2016 12:52 PM  Peri-wound Assessment Intact 02/07/2016  9:38 AM  Wound Length (cm) 13.5 cm 02/04/2016 10:02 AM  Wound Width (cm) 6.5 cm 02/04/2016 10:02 AM  Wound Depth (cm) 0.5 cm 02/04/2016 10:02 AM  Tunneling (cm) 0 02/04/2016 10:02 AM  Undermining (cm) .7 cm 6-7 o'clock 02/04/2016 10:02 AM  Margins Unattached edges (unapproximated) 02/08/2016  1:35 PM  Closure None 02/08/2016  1:35 PM  Drainage Amount Moderate 02/08/2016  1:35 PM  Drainage Description Purulent;Odor 02/08/2016  1:35 PM  Treatment Debridement (Selective);Hydrotherapy (Pulse lavage);Packing (Saline gauze) 02/08/2016  1:35 PM  Dressing Type Compression wrap 02/07/2016  1:11 AM  Dressing Changed Changed 02/03/2016  8:00 AM      Hydrotherapy Pulsed lavage therapy - wound location: L foot and lower leg Pulsed Lavage with Suction (psi): 8 psi Pulsed Lavage with Suction - Normal Saline Used: 2000 mL (1000 mL for each wound) Pulsed Lavage Tip: Tip with splash shield Selective Debridement Selective Debridement - Location: L foot and lower leg Selective Debridement - Tools Used: Forceps;Scissors Selective Debridement - Tissue Removed: Black and yellow necrotic tissue   Wound Assessment and Plan  Wound Therapy - Assess/Plan/Recommendations Wound Therapy - Clinical Statement: Progressing with removal of non-viable necrotic tissue Wound Therapy - Functional Problem List: Decreased tolerance of OOB Factors Delaying/Impairing Wound Healing: Immobility;Multiple medical problems Hydrotherapy Plan: Debridement;Dressing change;Patient/family education;Pulsatile lavage with suction Wound Therapy - Frequency: 6X / week Wound  Therapy - Follow Up Recommendations: Skilled nursing facility Wound Plan: See above  Wound Therapy Goals- Improve the function of patient's integumentary system by progressing the  wound(s) through the phases of wound healing (inflammation - proliferation - remodeling) by: Decrease Necrotic Tissue to: 50 (L lower leg wound) Decrease Necrotic Tissue - Progress: Met Increase Granulation Tissue to: 50 (L lower leg wound) Increase Granulation Tissue - Progress: Met Goals/treatment plan/discharge plan were made with and agreed upon by patient/family: Yes Time For Goal Achievement: 7 days Wound Therapy - Potential for Goals: Good  Goals will be updated until maximal potential achieved or discharge criteria met.  Discharge criteria: when goals achieved, discharge from hospital, MD decision/surgical intervention, no progress towards goals, refusal/missing three consecutive treatments without notification or medical reason.  GP     Rolinda Roan 02/08/2016, 1:45 PM   Rolinda Roan, PT, DPT Acute Rehabilitation Services Pager: [phone]

## 2016-02-08 NOTE — Progress Notes (Signed)
Pt d/c'd via wheelchair with belongings, escorted by unit staff.  

## 2016-02-08 NOTE — Discharge Summary (Signed)
Name: Vincent Miranda MRN: [id number] DOB: 1925/06/13 82 y.o. PCP: No Pcp Per Patient  Date of Admission: 02/02/2016 11:49 AM Date of Discharge: 02/08/2016 Attending Physician: Tyson Aliasuncan Thomas Vincent, MD  Discharge Diagnosis: Principal Problem:   Ulcer of left lower leg (HCC) Active Problems:   Protein-calorie malnutrition, severe (HCC)   Hypertension   CKD (chronic kidney disease), stage III   Anemia of chronic disease   Gout   Cellulitis of left lower extremity   Subacute osteomyelitis of foot (HCC)  Discharge Medications:   Medication List    STOP taking these medications        acidophilus Caps capsule      TAKE these medications        allopurinol 100 MG tablet  Commonly known as:  ZYLOPRIM  Take 1 tablet (100 mg total) by mouth 2 (two) times daily before a meal.     ertapenem 0.5 g in sodium chloride 0.9 % 50 mL  Inject 0.5 g into the vein daily.  Start taking on:  02/09/2016     feeding supplement (ENSURE ENLIVE) Liqd  Take 237 mLs by mouth 2 (two) times daily between meals.     furosemide 40 MG tablet  Commonly known as:  LASIX  Take 1 tablet (40 mg total) by mouth daily.     ZINC PO  Take 1 tablet by mouth daily.        Disposition and follow-up:   [name].  At the hospital follow up visit please address:  Infected left lower leg ulcer: Chronic leg ulcers now with new infected lesion overlying anterior distal left tibia. He is adamantly decided against any form of amputation at this time despite recommendation by 2 orthopedic surgeons during this admission. He was discharged on ertapenem 500mg  daily through his PICC for 2 weeks (02/22/16) with home health services. The goal is to stabilize his condition and hopefully delay any adverse complications but his wound is unlikely to resolve fully -If he is amenable referral to wound care clinic may be of benefit. -He is  also a concern for safety at home due to gait instability and weakness so ask how he is getting around at home.  Hypertension: Blood pressures in 160-180s while withholding his lasix due to dehydration in the hospital. -Discharged on once daily dose but can resume his twice daily home dose if hypertensive and volume status is appropriate at follow up.  Gout: Secondary gout from his CKD stage 3 he was taking allopurinol 100mg  BID from his PCP Dr. Bruna PotterBlount who passed away last year. He has no sensation in the distal half of his shins or further. -Very important issue to him although unlikely to affect his situation much.  Labs / imaging needed at time of follow-up: Weekly CBC on home ertapenem   Follow-up Appointments: Follow-up Information    Follow up with Hyacinth MeekerAhmed, Tasrif, MD In 1 week.   Specialty:  Internal Medicine   Why:  @2 :15pm for hospital follow up, wound re-check   Contact information:   [address] [place] [address] [phone]       Discharge Instructions:   Consultations:    Procedures Performed:  Dg Tibia/fibula Left  02/02/2016  CLINICAL DATA:  Distal lower extremity ulceration EXAM: LEFT TIBIA AND FIBULA - 2 VIEW COMPARISON:  None. FINDINGS: Frontal and lateral views were obtained. There is a large soft tissue ulceration located medial and  anterior to the distal tibia and fibula measuring 4.8 x 1.9 cm. A small amount of soft tissue air is seen slightly superior to this large ulceration. There is no erosive change or bony destruction. No fracture or dislocation evident. There is osteoarthritic change in the knee and ankle joint regions. There is marked narrowing in the lateral compartment of the knee joint. There is no abnormal periosteal reaction. IMPRESSION: Large distal soft tissue ulceration anterior and medial in location. A small amount of soft tissue air is seen just superior to this large ulceration which may indicate spread of infection into the soft tissues  slightly more superiorly. No bony lesion. Specifically, no osteomyelitis is evident. No fracture or dislocation. There is arthropathy in the knee and shoulder regions. Electronically Signed   By: Bretta Bang III M.D.   On: 02/02/2016 12:50   Mr Tibia Fibula Left Wo Contrast  02/03/2016  CLINICAL DATA:  Chronic ulcers along the lower legs. Toe amputations. New wound along the left shin. Increased swelling. EXAM: MRI OF LOWER LEFT EXTREMITY WITHOUT CONTRAST TECHNIQUE: Multiplanar, multisequence MR imaging of the left lower leg was performed. No intravenous contrast was administered. COMPARISON:  02/02/2016 FINDINGS: Asymmetric cutaneous ulceration anteriorly along the lower leg just above the level of the ankle, skin defect approximately 5.3 by 5.0 cm, with a small amount of gas tracking in the soft tissues adjacent to the ulceration and with confluent edema tracking in the adjacent subcutaneous tissues. A lesser amount of subcutaneous edema tracks throughout the calf and there is low-level edema tracking in the calf musculature bilaterally. The tibialis anterior tendon appears discontinuous along the ulceration, images 15-18 series 4. No underlying osteomyelitis or non draining ulcer in this area. There is a suggestion of cutaneous ulceration overlying both right and left medial malleoli. IMPRESSION: 1. Large cutaneous ulceration of the anterior lower leg just above the level of the ankle joint. There is discontinuity of the tibialis anterior tendon in the vicinity of this deep ulceration, as well as confluent subcutaneous edema likely reflecting local surrounding cellulitis. 2. Generalized subcutaneous edema in the calves, left greater than right, and also in the regional muscles. Although possibly from noninfectious causes, the possibility of myositis and cellulitis is difficult to exclude. 3. No non draining abscess is seen. No regional osteomyelitis identified. 4. There is are likely ulcerations  overlying both the right and left medial malleoli. Electronically Signed   By: Gaylyn Rong M.D.   On: 02/03/2016 07:48   Dg Chest Port 1 View  02/07/2016  CLINICAL DATA:  PICC placement. EXAM: PORTABLE CHEST 1 VIEW COMPARISON:  03/26/2015 FINDINGS: A right PICC has been placed and terminates over the mid SVC. Thoracic aortic tortuosity is similar to the prior study. Cardiac silhouette is within normal limits for portable AP technique. Mild streaky opacity is present in the left greater than right lung bases. No sizable pleural effusion or pneumothorax is identified. No acute osseous abnormality is identified. IMPRESSION: 1. Right PICC terminating over the mid SVC. 2. Mild left basilar opacity which may reflect atelectasis versus infection. Minimal right basilar atelectasis or scarring. Electronically Signed   By: Sebastian Ache M.D.   On: 02/07/2016 18:05   Dg Foot Complete Left  02/02/2016  CLINICAL DATA:  82 year old male with a history of foot wound and prior amputation. EXAM: LEFT FOOT - COMPLETE 3+ VIEW COMPARISON:  10/18/2013, 03/26/2015 FINDINGS: Postoperative changes of prior first ray amputation again noted. In the interval, there has been destructive changes of  the distal second metatarsal and distal third metatarsal, with periosteal reaction and disruption of the third metatarsal phalangeal joint. Destructive changes present at the proximal phalanx of the third toe. Soft tissue swelling at the third toe. Subcutaneous gas extends laterally from the surgical bed, adjacent to the distal second and third metatarsal. No acute fracture is identified. Soft tissue defect on the distal anterior calf, overlying the tibia. Vascular calcifications. IMPRESSION: Destructive changes of the distal second and third metatarsal as well as the proximal phalanx of the third toe, concerning for osteomyelitis given the adjacent soft tissue defect. Postoperative changes of prior left first ray amputation. Soft tissue  defect on the lateral view of the anterior distal calf, potentially a second wound. Evidence of tibial and pedal vascular disease. Signed, Yvone Neu. Loreta Ave, DO [facility] Radiology Electronically Signed   By: Gilmer Mor D.O.   On: 02/02/2016 12:52    Admission HPI: 82 y/o man with PMHx of hypertension, CKD stage 3, gout, anemia of chronic disease, and multiple chronic lower extremity ulcers s/p debridements and several toe amputations presents with new left shin wound. He is seen weekly by a home health nursing aide for wound care for the past few months, but he has not seen a physician since his PCP passed away last year. His wife first noticed increased swelling on anterior left leg above the ankle since 3/11. The wound became darker then began draining purulent contents since his wife applied pressure while inspecting it with help of home health wound nurse. The ulcer was painful initially but not since it started draining. No fevers, chills, weakness, or change in mental status were reported during this time.  After arrival to the ED vital signs improved to grossly normal with fluid resuscitation but lactic acidosis of 3.94, CRP 19.7, leukocytosis to 24.1 on initial labs. Xray of left leg showed destructive changes of distal phalanges concerning for osteomyelitis.  Patient not able to give well organized history, collateral information obtained from wife at bedside.  Hospital Course by problem list: Infected ulcer of left leg: Admitted to medicine service on 3/15 with weakness, tachycardia, and confusion 2/2 infection of lower left leg ulcer and chronic nonhealing wounds of his feet. Vital signs were stable after fluid resuscitation. Leukocytosis to 24.1 and inflammatory markers elevated consistent with ongoing infection. MRI of the left leg was obtained showing no tracking or extension proximally. He was started on empiric coverage with vancomycin  and zosyn. Blood cultures obtained were negative for bacteremia. Orthopedic surgery was consulted and evaluated his wounds recommending amputation of the left leg due to poor vascularization and any other surgery would be unlikely to heal adequately. Due to the chronicity, depth of the wound, and poor blood supply it is not feasible to expect a resolution with medical management alone. Mr. Hanway and his wife are adamantly opposed to amputation even when counseled he is at high risk of worsening complications from these infections including death. However he reports no wish to die any time soon and declines to discuss end of life planning in light of this. Hydrotherapy was provided with daily irrigation of his ulcer and basic dressing changes of his this and his chronic foot lesions. He was continually hypernatremic and moderately dehydrated on examination despite PO and IV fluids. On 3/20 a PICC line was placed for continued IV antibiotics at home. He declines physical therapy evaluation during the stay and and is opposed to any form of placement to  SNF or rehab facility instead only wanting to go home. He was agreeable to home health services.  Hypertension: Blood pressure elevated during hospital stay to 160-180s. Home medication lasix BID was held due to dehydration and his initial presentation with tachycardia due to his infection. No new medications were started during hospitalization for this.  Protein-calorie malnutrition: PO intake was fairly poor throughout his admission. He complained of diarrhea with drinking nutritional supplements. Improved  Discharge Vitals:   BP 162/84 mmHg  Pulse 97  Temp(Src) 98.4 F (36.9 C) (Oral)  Resp 17  Ht  (1.803 m)  Wt 71.6 kg (157 lb 13.6 oz)  BMI 22.03 kg/m2  SpO2 96%  Discharge Labs:  No results found for this or any previous visit (from the past 24 hour(s)).  Signed: Fuller Plan, MD 02/08/2016, 11:55 AM   Services Ordered on  Discharge: PT, OT, RN

## 2016-02-08 NOTE — Progress Notes (Signed)
Physical Therapy Treatment Patient Details Name: Vincent Miranda MRN: [id number] DOB: Aug 14, 1925 Today's Date: 02/08/2016    History of Present Illness 82 y/o man with PMHx of hypertension, CKD stage 3, gout, anemia of chronic disease, and multiple chronic lower extremity ulcers s/p debridements and several toe amputations presents with new left shin wound/cellulitis.    PT Comments    Pt agreeable to minimal mobilization around room. Pt required light min assist to achieve full standing (for walker support only), and demonstrated the ability to ambulate ~30 feet with RW for support. Wife present during session and states that pt will not have to walk farther than he did today to get in and get settled into the house. Will continue to follow and progress as able per POC.   Follow Up Recommendations  Home health PT;Supervision/Assistance - 24 hour     Equipment Recommendations  None recommended by PT    Recommendations for Other Services       Precautions / Restrictions Precautions Precautions: Fall Restrictions Weight Bearing Restrictions: No    Mobility  Bed Mobility Overal bed mobility: Needs Assistance Bed Mobility: Supine to Sit;Sit to Supine     Supine to sit: Supervision Sit to supine: Supervision   General bed mobility comments: No assist required. HOB slightly elevated as pt cannot tolerate laying flat.  Transfers Overall transfer level: Needs assistance Equipment used: Rolling walker (2 wheeled) Transfers: Sit to/from Stand Sit to Stand: Min guard;Min assist         General transfer comment: Close guard for safety. Pt was able to stand by pulling up from the RW. Pt was cued for hand placement on seated surface for safety however did not make corrective changes. Min assist was required to keep RW from tipping backwards as pt powered-up to full stand.   Ambulation/Gait Ambulation/Gait assistance: Min guard Ambulation Distance (Feet): 30 Feet Assistive  device: Rolling walker (2 wheeled) Gait Pattern/deviations: Step-through pattern;Decreased stride length;Trunk flexed Gait velocity: Decreased Gait velocity interpretation: Below normal speed for age/gender General Gait Details: Pt was able to mobilize around the room with close guard for safety. No unsteadiness or LOB noted with RW for support.    Stairs            Wheelchair Mobility    Modified Rankin (Stroke Patients Only)       Balance Overall balance assessment: History of Falls                                  Cognition Arousal/Alertness: Awake/alert Behavior During Therapy: WFL for tasks assessed/performed Overall Cognitive Status: Within Functional Limits for tasks assessed                      Exercises      General Comments        Pertinent Vitals/Pain Pain Assessment: No/denies pain    Home Living                      Prior Function            PT Goals (current goals can now be found in the care plan section) Acute Rehab PT Goals Patient Stated Goal: Home today PT Goal Formulation: With patient Time For Goal Achievement: 02/17/16 Potential to Achieve Goals: Fair Progress towards PT goals: Progressing toward goals    Frequency  Min 3X/week    PT Plan  Current plan remains appropriate    Co-evaluation             End of Session Equipment Utilized During Treatment: Gait belt Activity Tolerance: Patient tolerated treatment well Patient left: in bed;with call bell/phone within reach;with family/visitor present     Time: [id number] PT Time Calculation (min) (ACUTE ONLY): 10 min  Charges:  $Gait Training: 8-22 mins                    G Codes:      Conni SlipperKirkman, Hala Narula 02/08/2016, 1:34 PM  Conni SlipperLaura Venkat Ankney, PT, DPT Acute Rehabilitation Services Pager: [phone]

## 2016-02-08 NOTE — Progress Notes (Signed)
   Subjective: He reports his PICC placement yesterday was very painful but feels fine now. Resting comfortably eating part of breakfast today. Eager to return home today.  Objective: Vital signs in last 24 hours: Filed Vitals:   02/07/16 0441 02/07/16 1312 02/07/16 2245 02/08/16 0634  BP: 116/65 177/88 152/72 162/84  Pulse: 72 81 86 97  Temp: 97.4 F (36.3 C) 98.7 F (37.1 C) 98.6 F (37 C) 98.4 F (36.9 C)  TempSrc: Oral Oral Oral Oral  Resp: 16 18 17 17   Height:      Weight:      SpO2: 100% 97% 97% 96%   Weight change:   Intake/Output Summary (Last 24 hours) at 02/08/16 1017 Last data filed at 02/08/16 16100613  Gross per 24 hour  Intake 8786.67 ml  Output    550 ml  Net 8236.67 ml   GENERAL- chronically ill appearing elderly man, co-operative, NAD CARDIAC- RRR, no murmurs, rubs or gallops RESP- CTAB, no wheezes or crackles EXTREMITIES- both feet in dry white dressings, mild nonpitting edema on distal left leg, RUE PICC line no bleeding, ecchymoses, erythema SKIN- Warm, dry, poor skin turgor PSYCH- pleasant, conversant  Medications: I have reviewed the patient's current medications. Scheduled Meds: . allopurinol  100 mg Oral BID AC  . enoxaparin (LOVENOX) injection  30 mg Subcutaneous Q24H  . feeding supplement  1 Container Oral BID BM  . feeding supplement (PRO-STAT SUGAR FREE 64)  30 mL Oral BID  . piperacillin-tazobactam (ZOSYN)  IV  3.375 g Intravenous Q8H  . vancomycin  750 mg Intravenous Q24H   Continuous Infusions: . dextrose 5 % and 0.45% NaCl Stopped (02/08/16 0603)   Assessment/Plan: Left lower leg ulcer: PICC line placed yesterday afternoon, I reviewed CXR confirming appropriate position. Unclear why he reported a lot of pain with this but is fine now and insertion site looks good.  -Adamantly refuses surgery or SNF placement -change vanc/zosyn 3/15>>ertapenem through PICC x2wks after discharge -[facility] -[facility] as he lost PCP (Dr. Bruna PotterBlount) several months ago  Anemia of chronic disease: Hgb in 7s. Stable. Significant CKD. No immediate intervention indicated, no iron deficiency, no active bleeding. - Limiting phlebotomy  Chronic kidney disease: At baseline function.  Hypertension: Home lasix BID has been held during admission here for his hypernatremia, poor PO intake, large IV sodium load. Hypertensive increased with volume replacement. -Will discharge on half home dose and follow up closely in clinic.  Diet: Regular VTE ppx: Filer enoxaparin FULL CODE  Dispo: Anticipate discharge home later today with home health services.  The patient does not have a current PCP (No Pcp Per Patient) and [facility]. The patient does have transportation limitations that hinder transportation to clinic appointments.   LOS: 6 days   Fuller Planhristopher W Joniel Graumann, MD 02/08/2016, 10:17 AM

## 2016-02-08 NOTE — Progress Notes (Addendum)
D/c instructions reviewed with pt and his wife. Copy of instructions and script given to pt. HH nursing to provide care for wounds to legs/feet and IV antibiotics. Pt will d/c with his belongings with his wife. Waiting for NT with wheelchair to return to unit to take pt out for discharge.

## 2016-02-08 NOTE — Telephone Encounter (Signed)
Need TOC Pt scheduled for 02/15/16 at 2:15 with dr Tasia Catchingsahmed

## 2016-02-08 NOTE — Progress Notes (Signed)
OT Cancellation Note  Patient Details Name: Vincent Miranda MRN: [id number] DOB: [birth year]   Cancelled Treatment:    Reason Eval/Treat Not Completed: Other (comment). Pt declined stating that he had gotten up and walked earlier and he just wanted to rest now and let his IV finish so he could go home. Per chart review wife was A'ing pt with basic ADLs pta and will still need this especially for LBB/D due to bandages on feet. Pt to D/C today with HHPT recommended, they can recommended HHOT post D/C if it is needed. We will sign off due to pt to D/C today.  [name], [name] [phone] 02/08/2016, 1:58 PM

## 2016-02-08 NOTE — Progress Notes (Signed)
PT Cancellation Note  Patient Details Name: Vincent PigeonRobert T Miranda MRN: [id number] DOB: Apr 16, 1925   Cancelled Treatment:    Reason Eval/Treat Not Completed: Other (comment)   Another attempt to see [name] (prior to hydrotherapy) to assess mobility and make recs for dc home today;  He has again refused mobility at this time, wanting breakfast first (offered to help him OOB to eat), then wanting to get dressed (offered to assist him getting dressed); then he ultimately said no at this time;   I will reattempt later today;   Knowing he will refuse SNF for post-acute rehab, we are left with getting him max services and equipment in the home:  HHPT/OT/RN/Aide  Wheelchair with cushion, elevating legrests  Hospital bed  3in1 commode (if he doesn't already have one)   Van ClinesHolly Destini Cambre, PT  Acute Rehabilitation Services Pager [phone] Office [phone]    Van ClinesGarrigan, Perlita Forbush Hamff 02/08/2016, 9:03 AM

## 2016-02-09 DIAGNOSIS — L03116 Cellulitis of left lower limb: Secondary | ICD-10-CM | POA: Diagnosis not present

## 2016-02-09 DIAGNOSIS — T8754 Necrosis of amputation stump, left lower extremity: Secondary | ICD-10-CM | POA: Diagnosis not present

## 2016-02-09 DIAGNOSIS — Z89421 Acquired absence of other right toe(s): Secondary | ICD-10-CM | POA: Diagnosis not present

## 2016-02-09 DIAGNOSIS — A419 Sepsis, unspecified organism: Secondary | ICD-10-CM | POA: Diagnosis not present

## 2016-02-09 DIAGNOSIS — M1040 Other secondary gout, unspecified site: Secondary | ICD-10-CM | POA: Diagnosis not present

## 2016-02-09 DIAGNOSIS — Z89412 Acquired absence of left great toe: Secondary | ICD-10-CM | POA: Diagnosis not present

## 2016-02-09 DIAGNOSIS — T8753 Necrosis of amputation stump, right lower extremity: Secondary | ICD-10-CM | POA: Diagnosis not present

## 2016-02-09 DIAGNOSIS — I120 Hypertensive chronic kidney disease with stage 5 chronic kidney disease or end stage renal disease: Secondary | ICD-10-CM | POA: Diagnosis not present

## 2016-02-09 DIAGNOSIS — M86472 Chronic osteomyelitis with draining sinus, left ankle and foot: Secondary | ICD-10-CM | POA: Diagnosis not present

## 2016-02-09 DIAGNOSIS — E43 Unspecified severe protein-calorie malnutrition: Secondary | ICD-10-CM | POA: Diagnosis not present

## 2016-02-09 DIAGNOSIS — N183 Chronic kidney disease, stage 3 (moderate): Secondary | ICD-10-CM | POA: Diagnosis not present

## 2016-02-09 DIAGNOSIS — I70263 Atherosclerosis of native arteries of extremities with gangrene, bilateral legs: Secondary | ICD-10-CM | POA: Diagnosis not present

## 2016-02-09 NOTE — Telephone Encounter (Signed)
Transition Care Management Follow-up Telephone Call   Date discharged?02/08/2016   How have you been since you were released from the hospital? "fine"   Do you understand why you were in the hospital? yes   Do you understand the discharge instructions? yes   Where were you discharged to? home   Items Reviewed:  Medications reviewed: "i dont need to go over them", "talk to my wife" she answered that the nurse was there and they had no questions and he and she understood everything  Allergies reviewed: no  Dietary changes reviewed: no  Referrals reviewed: no   Functional Questionnaire:   Activities of Daily Living (ADLs):   He states they are independent in the following: "he's fine" States they require assistance with the following: i said he's fine   Any transportation issues/concerns?: no   Any patient concerns? no   Confirmed importance and date/time of follow-up visits scheduled yes  Provider Appointment booked with  Confirmed with patient if condition begins to worsen call PCP or go to the ER.  Patient was given the office number and encouraged to call back with question or concerns.  : yes She did let me give them directions on [facility]

## 2016-02-12 DIAGNOSIS — T8754 Necrosis of amputation stump, left lower extremity: Secondary | ICD-10-CM | POA: Diagnosis not present

## 2016-02-12 DIAGNOSIS — A419 Sepsis, unspecified organism: Secondary | ICD-10-CM | POA: Diagnosis not present

## 2016-02-12 DIAGNOSIS — T8753 Necrosis of amputation stump, right lower extremity: Secondary | ICD-10-CM | POA: Diagnosis not present

## 2016-02-12 DIAGNOSIS — L03116 Cellulitis of left lower limb: Secondary | ICD-10-CM | POA: Diagnosis not present

## 2016-02-12 DIAGNOSIS — I70263 Atherosclerosis of native arteries of extremities with gangrene, bilateral legs: Secondary | ICD-10-CM | POA: Diagnosis not present

## 2016-02-12 DIAGNOSIS — M86472 Chronic osteomyelitis with draining sinus, left ankle and foot: Secondary | ICD-10-CM | POA: Diagnosis not present

## 2016-02-14 DIAGNOSIS — A419 Sepsis, unspecified organism: Secondary | ICD-10-CM | POA: Diagnosis not present

## 2016-02-14 DIAGNOSIS — L03116 Cellulitis of left lower limb: Secondary | ICD-10-CM | POA: Diagnosis not present

## 2016-02-14 DIAGNOSIS — Z5181 Encounter for therapeutic drug level monitoring: Secondary | ICD-10-CM | POA: Diagnosis not present

## 2016-02-14 DIAGNOSIS — M86472 Chronic osteomyelitis with draining sinus, left ankle and foot: Secondary | ICD-10-CM | POA: Diagnosis not present

## 2016-02-14 DIAGNOSIS — T8754 Necrosis of amputation stump, left lower extremity: Secondary | ICD-10-CM | POA: Diagnosis not present

## 2016-02-14 DIAGNOSIS — I70263 Atherosclerosis of native arteries of extremities with gangrene, bilateral legs: Secondary | ICD-10-CM | POA: Diagnosis not present

## 2016-02-14 DIAGNOSIS — T8753 Necrosis of amputation stump, right lower extremity: Secondary | ICD-10-CM | POA: Diagnosis not present

## 2016-02-15 ENCOUNTER — Ambulatory Visit (INDEPENDENT_AMBULATORY_CARE_PROVIDER_SITE_OTHER): Payer: Medicare Other | Admitting: Internal Medicine

## 2016-02-15 ENCOUNTER — Encounter: Payer: Self-pay | Admitting: Internal Medicine

## 2016-02-15 ENCOUNTER — Observation Stay: Admission: AD | Admit: 2016-02-15 | Payer: Medicare Other | Source: Ambulatory Visit | Admitting: Oncology

## 2016-02-15 VITALS — BP 163/81 | HR 74 | Temp 97.9°F | Ht 71.0 in | Wt 156.2 lb

## 2016-02-15 DIAGNOSIS — L97923 Non-pressure chronic ulcer of unspecified part of left lower leg with necrosis of muscle: Secondary | ICD-10-CM

## 2016-02-15 DIAGNOSIS — R338 Other retention of urine: Secondary | ICD-10-CM | POA: Diagnosis not present

## 2016-02-15 DIAGNOSIS — R339 Retention of urine, unspecified: Secondary | ICD-10-CM | POA: Insufficient documentation

## 2016-02-15 MED ORDER — ALFUZOSIN HCL ER 10 MG PO TB24: 10.0000 mg | ORAL_TABLET | Freq: Every day | ORAL | Status: DC

## 2016-02-15 NOTE — Assessment & Plan Note (Addendum)
Patient having acute urinary retention without any apparent cause. No injuries, not on any medication that could cause this. On bladder scan he has >999 cc. Has suprapubic fullness and has tenderness on exam. No CVA tenderness, no preceding dysuria or other UTI symptoms.   On neuro exam has 4/5 str on bilateral lower exts. Has diminished sensation to touch on both feet but that's likely chronic given he has no feeling of pain from his extensive ulcerations. I did not perform a rectal exam as patient declined; however BPH, mass, stool impaction are thinks to consider (did have regular bowel movement last few days). Rectal tone could not be assessed but rest of the neuro exam is unchanged from prior.   Unclear etiology of acute urinary retention.   - We placed a foley cath, drained ~1L clear light yellow urine (no blood or puss). Post void bladder scan showed 150 cc urine. - UA was normal. - he will go home with foley cath. Wrote for  alfuzosin 10mg  daily.  - put in referral for urology. Nurse called urology office, they will be calling the patient.

## 2016-02-15 NOTE — Assessment & Plan Note (Addendum)
Patient has significant ulceration and gangrene from PVD. MRI did not confirm osteo but he had infection spread into deep tissue layers including myositis. Still does not want surgical consideration as it was recommended by ortho consultants. Wants to proceed with continuation of IV abx (end date 02/22/16).   Asked me to manage his wound care. I recommended wound care clinic but patient's wife does not want to go there. Has a home health nurse who needs instruction for wound care. I think it would be better to have wound care nursing evaluate his wound. I expressed that it would be better to get wound care expert's opinion regarding but they are not in agreement. My nurse has called the [facility] to request a wound care nurse to go to his house for wound management. Family agreeable to this. I wrote for silver alginate dressing changes daily for now.   -f/up on 02/22/16.

## 2016-02-15 NOTE — Progress Notes (Signed)
Report called to Rachel,RN on 5W.

## 2016-02-15 NOTE — Progress Notes (Signed)
Subjective:    Patient ID: Vincent Miranda, male    DOB: 11-24-1924, 82 y.o.   MRN: [id number]  HPI  82 yo male with PVD with gangrene, chronic deep wound of the left leg/foot, CKD III, HTN, s/p amputation of left great toe is here for hospital follow up.  He was evaluated by Dr. Lajoyce Corners and Dr. Magnus Ivan as second opinion for the extensive ulceration of his left foot. They both recommended amputation. Patient and his wife does not want to pursue that option. Even though our inpatient team has exntensive discussion with them and recommended surgery as the appropriate treatment for this extensive gangrenes, patient opted to do try IV antibiotic instead of left leg amputation. Patient continues to not want surgical intervention, also is happy with the progress of the wound. Right now they are receiving home health nursing who checks vitals and does daily dressing changes. He is receiving 2 weeks of IV ertapenem for braod spectrum coverage for his ulcers via PICC to be completed on 02/22/16. Had lab (BMET and CBC) drawn on 3/27. I dont' have these records yet.  Patient's wife wants me to manage the wound care. She had bad experience at the wound care center and does not want me to refer the patient to a wound care specialist. I told her I am not an expert in this field. Then she said "you want me to try things on our own?" I told her that's what what I want for the patient. Ideally I would recommend wound care clinic evaluation and management but patient's wife wants me to try to manage this as much as I can.   Patient is also having urinary retention since day before yesterday. No previous hx of this. Feels some suprapubic pressure but not able to void. Bladder scan showed >999 cc fluid. We placed foley cathether which drained close to 1L urine, post void bladder scan showed 150 cc urine.   Review of Systems  Constitutional: Negative for fever, chills and fatigue.  HENT: Negative for congestion, sore  throat and trouble swallowing.   Respiratory: Negative for cough, chest tightness and shortness of breath.   Cardiovascular: Negative for chest pain and palpitations.  Gastrointestinal: Negative for nausea, vomiting, abdominal pain, diarrhea and abdominal distention.  Endocrine: Negative.   Genitourinary: Positive for decreased urine volume and difficulty urinating. Negative for hematuria, flank pain, discharge, penile pain and testicular pain.  Musculoskeletal: Negative for back pain and arthralgias.  Skin:       Ulceration of left foot.   Allergic/Immunologic: Negative.   Neurological: Negative for dizziness, facial asymmetry and headaches.  Hematological: Negative.   Psychiatric/Behavioral: Negative.        Objective:   Physical Exam  Constitutional: He is oriented to person, place, and time. No distress.  Pleasant male sitting in wheel chair. Wife is answering most of the questions.  HENT:  Head: Normocephalic and atraumatic.  Eyes: EOM are normal. Pupils are equal, round, and reactive to light.  Neck: Normal range of motion. No JVD present.  Cardiovascular: Normal rate and regular rhythm.  Exam reveals no gallop and no friction rub.   No murmur heard. Pulmonary/Chest: Effort normal and breath sounds normal. No respiratory distress. He has no wheezes.  Abdominal: Soft. Bowel sounds are normal.  Has mild suprapubic distension with tenderness to palpation.   Musculoskeletal:  has one large round broad based ulcer on the dorsal mid left foot with no purulent drainage currenlty. no tenderness. no foul  smell. amputated left great toe. has extensive ulceration with exposure of deeper soft tissue on medial aspect of left foot.  right foot appears to be healing from previous ulcers. no drainage currently.  Neurological: He is alert and oriented to person, place, and time.  Skin: He is not diaphoretic.     Filed Vitals:   02/15/16 1432  BP: 163/81  Pulse: 74  Temp: 97.9 F (36.6  C)            Assessment & Plan:  See problem based a&p.

## 2016-02-15 NOTE — Progress Notes (Signed)
1645PM- Inserted #16Fr foley catheter w/o difficulty using sterile technique. 1300cc of urine noted; Dr Tasia CatchingsAhmed informed. Bladder scan done - 151cc noted.

## 2016-02-15 NOTE — Patient Instructions (Addendum)
Follow up with urology. Leave the foley in until your appointment.   Take alfuzosin daily starting today.   Urology office will call you with appt.   Please ask your home health nurse to do silver alginate dressing. Do dressing changes every day. Clean wound with neutral substance daily.

## 2016-02-16 ENCOUNTER — Telehealth: Payer: Self-pay | Admitting: Student in an Organized Health Care Education/Training Program

## 2016-02-16 ENCOUNTER — Telehealth: Payer: Self-pay | Admitting: General Practice

## 2016-02-16 LAB — URINALYSIS, ROUTINE W REFLEX MICROSCOPIC
BILIRUBIN UA: NEGATIVE
Glucose, UA: NEGATIVE
Ketones, UA: NEGATIVE
LEUKOCYTES UA: NEGATIVE
Nitrite, UA: NEGATIVE
RBC UA: NEGATIVE
SPEC GRAV UA: 1.015 (ref 1.005–1.030)
Urobilinogen, Ur: 0.2 mg/dL (ref 0.2–1.0)
pH, UA: 6 (ref 5.0–7.5)

## 2016-02-16 LAB — MICROSCOPIC EXAMINATION: EPITHELIAL CELLS (NON RENAL): NONE SEEN /HPF (ref 0–10)

## 2016-02-16 NOTE — Telephone Encounter (Signed)
Called line busy, will call again shortly

## 2016-02-16 NOTE — Telephone Encounter (Signed)
Please call pt wife back.

## 2016-02-16 NOTE — Telephone Encounter (Signed)
Vincent Miranda spoke to pt with me, she gave pt's wife info for urology for 4/4 at 0900

## 2016-02-16 NOTE — Telephone Encounter (Signed)
82 year old man with severe infection of ischemic left lower extremity chronic wounds that was hospitalized on my service one week ago. I received his one week lab report from his home health company. He is on a 2 week course of IV ertapenem to treat the soft tissue infection and hopefully temporize his declining condition. The labs are reassuring. Hgb is 7.9g and creatinine 2.0 which are both stable from discharge. Albumin 2.5, up from 1.6 during the hospitalization. [facility] Dr[facility] Tasia CatchingsAhmed who diverted a readmission by treating acute urinary retention. It seems his wounds have stabilized and hopefully he will have some quality time at home over the next few weeks. I still do not think this will be curative. I would like to stop Ertapenem on 4/4 and I ask that home health remove the PICC at that time.

## 2016-02-17 DIAGNOSIS — M86472 Chronic osteomyelitis with draining sinus, left ankle and foot: Secondary | ICD-10-CM | POA: Diagnosis not present

## 2016-02-17 DIAGNOSIS — T8754 Necrosis of amputation stump, left lower extremity: Secondary | ICD-10-CM | POA: Diagnosis not present

## 2016-02-17 DIAGNOSIS — A419 Sepsis, unspecified organism: Secondary | ICD-10-CM | POA: Diagnosis not present

## 2016-02-17 DIAGNOSIS — T8753 Necrosis of amputation stump, right lower extremity: Secondary | ICD-10-CM | POA: Diagnosis not present

## 2016-02-17 DIAGNOSIS — L03116 Cellulitis of left lower limb: Secondary | ICD-10-CM | POA: Diagnosis not present

## 2016-02-17 DIAGNOSIS — I70263 Atherosclerosis of native arteries of extremities with gangrene, bilateral legs: Secondary | ICD-10-CM | POA: Diagnosis not present

## 2016-02-17 LAB — URINE CULTURE: Organism ID, Bacteria: NO GROWTH

## 2016-02-17 NOTE — Addendum Note (Signed)
Addended by: Hyacinth MeekerAHMED, Abdullah Rizzi on: 02/17/2016 11:06 AM   Modules accepted: Orders

## 2016-02-20 ENCOUNTER — Observation Stay (HOSPITAL_BASED_OUTPATIENT_CLINIC_OR_DEPARTMENT_OTHER)
Admission: EM | Admit: 2016-02-20 | Discharge: 2016-02-21 | Disposition: A | Discharge status: Home / Self Care | Payer: Medicare Other | Source: Home / Self Care | Attending: Emergency Medicine | Admitting: Emergency Medicine

## 2016-02-20 ENCOUNTER — Emergency Department (HOSPITAL_COMMUNITY): Payer: Medicare Other

## 2016-02-20 ENCOUNTER — Encounter (HOSPITAL_COMMUNITY): Payer: Self-pay | Admitting: Neurology

## 2016-02-20 ENCOUNTER — Other Ambulatory Visit: Payer: Self-pay

## 2016-02-20 DIAGNOSIS — D62 Acute posthemorrhagic anemia: Secondary | ICD-10-CM

## 2016-02-20 DIAGNOSIS — E860 Dehydration: Secondary | ICD-10-CM | POA: Diagnosis present

## 2016-02-20 DIAGNOSIS — E876 Hypokalemia: Secondary | ICD-10-CM | POA: Diagnosis not present

## 2016-02-20 DIAGNOSIS — Z79899 Other long term (current) drug therapy: Secondary | ICD-10-CM

## 2016-02-20 DIAGNOSIS — R55 Syncope and collapse: Secondary | ICD-10-CM | POA: Diagnosis not present

## 2016-02-20 DIAGNOSIS — M109 Gout, unspecified: Secondary | ICD-10-CM

## 2016-02-20 DIAGNOSIS — E43 Unspecified severe protein-calorie malnutrition: Secondary | ICD-10-CM | POA: Diagnosis present

## 2016-02-20 DIAGNOSIS — A419 Sepsis, unspecified organism: Secondary | ICD-10-CM | POA: Diagnosis not present

## 2016-02-20 DIAGNOSIS — N179 Acute kidney failure, unspecified: Secondary | ICD-10-CM | POA: Diagnosis not present

## 2016-02-20 DIAGNOSIS — R404 Transient alteration of awareness: Secondary | ICD-10-CM | POA: Diagnosis not present

## 2016-02-20 DIAGNOSIS — N183 Chronic kidney disease, stage 3 unspecified: Secondary | ICD-10-CM | POA: Diagnosis present

## 2016-02-20 DIAGNOSIS — I4891 Unspecified atrial fibrillation: Secondary | ICD-10-CM | POA: Diagnosis not present

## 2016-02-20 DIAGNOSIS — D638 Anemia in other chronic diseases classified elsewhere: Secondary | ICD-10-CM | POA: Diagnosis present

## 2016-02-20 DIAGNOSIS — N189 Chronic kidney disease, unspecified: Secondary | ICD-10-CM

## 2016-02-20 DIAGNOSIS — I480 Paroxysmal atrial fibrillation: Secondary | ICD-10-CM | POA: Diagnosis not present

## 2016-02-20 DIAGNOSIS — R4182 Altered mental status, unspecified: Secondary | ICD-10-CM | POA: Diagnosis not present

## 2016-02-20 DIAGNOSIS — R531 Weakness: Secondary | ICD-10-CM | POA: Diagnosis not present

## 2016-02-20 DIAGNOSIS — E870 Hyperosmolality and hypernatremia: Secondary | ICD-10-CM | POA: Diagnosis not present

## 2016-02-20 DIAGNOSIS — I959 Hypotension, unspecified: Secondary | ICD-10-CM | POA: Diagnosis not present

## 2016-02-20 DIAGNOSIS — L97929 Non-pressure chronic ulcer of unspecified part of left lower leg with unspecified severity: Secondary | ICD-10-CM | POA: Diagnosis present

## 2016-02-20 DIAGNOSIS — L97829 Non-pressure chronic ulcer of other part of left lower leg with unspecified severity: Secondary | ICD-10-CM | POA: Diagnosis not present

## 2016-02-20 DIAGNOSIS — I129 Hypertensive chronic kidney disease with stage 1 through stage 4 chronic kidney disease, or unspecified chronic kidney disease: Secondary | ICD-10-CM | POA: Insufficient documentation

## 2016-02-20 LAB — COMPREHENSIVE METABOLIC PANEL
ALK PHOS: 61 U/L (ref 38–126)
ALT: 7 U/L — ABNORMAL LOW (ref 17–63)
ANION GAP: 9 (ref 5–15)
AST: 11 U/L — ABNORMAL LOW (ref 15–41)
Albumin: 1.8 g/dL — ABNORMAL LOW (ref 3.5–5.0)
BILIRUBIN TOTAL: 0.5 mg/dL (ref 0.3–1.2)
BUN: 16 mg/dL (ref 6–20)
CALCIUM: 8.9 mg/dL (ref 8.9–10.3)
CO2: 27 mmol/L (ref 22–32)
Chloride: 112 mmol/L — ABNORMAL HIGH (ref 101–111)
Creatinine, Ser: 1.94 mg/dL — ABNORMAL HIGH (ref 0.61–1.24)
GFR, EST AFRICAN AMERICAN: 34 mL/min — AB (ref >60)
GFR, EST NON AFRICAN AMERICAN: 29 mL/min — AB (ref >60)
Glucose, Bld: 137 mg/dL — ABNORMAL HIGH (ref 65–99)
Potassium: 2.8 mmol/L — ABNORMAL LOW (ref 3.5–5.1)
SODIUM: 148 mmol/L — AB (ref 135–145)
TOTAL PROTEIN: 7.2 g/dL (ref 6.5–8.1)

## 2016-02-20 LAB — CBC WITH DIFFERENTIAL/PLATELET
BASOS ABS: 0 10*3/uL (ref 0.0–0.1)
BASOS PCT: 0 %
EOS ABS: 0.1 10*3/uL (ref 0.0–0.7)
Eosinophils Relative: 2 %
HCT: 23.5 % — ABNORMAL LOW (ref 39.0–52.0)
HEMOGLOBIN: 7.5 g/dL — AB (ref 13.0–17.0)
Lymphocytes Relative: 9 %
Lymphs Abs: 0.5 10*3/uL — ABNORMAL LOW (ref 0.7–4.0)
MCH: 27.7 pg (ref 26.0–34.0)
MCHC: 31.9 g/dL (ref 30.0–36.0)
MCV: 86.7 fL (ref 78.0–100.0)
Monocytes Absolute: 0.3 10*3/uL (ref 0.1–1.0)
Monocytes Relative: 5 %
NEUTROS ABS: 5 10*3/uL (ref 1.7–7.7)
NEUTROS PCT: 84 %
Platelets: 156 10*3/uL (ref 150–400)
RBC: 2.71 MIL/uL — ABNORMAL LOW (ref 4.22–5.81)
RDW: 18.3 % — ABNORMAL HIGH (ref 11.5–15.5)
WBC: 6 10*3/uL (ref 4.0–10.5)

## 2016-02-20 LAB — BASIC METABOLIC PANEL
Anion gap: 9 (ref 5–15)
BUN: 15 mg/dL (ref 6–20)
CHLORIDE: 112 mmol/L — AB (ref 101–111)
CO2: 28 mmol/L (ref 22–32)
CREATININE: 1.82 mg/dL — AB (ref 0.61–1.24)
Calcium: 8.7 mg/dL — ABNORMAL LOW (ref 8.9–10.3)
GFR calc non Af Amer: 31 mL/min — ABNORMAL LOW (ref >60)
GFR, EST AFRICAN AMERICAN: 36 mL/min — AB (ref >60)
GLUCOSE: 114 mg/dL — AB (ref 65–99)
Potassium: 3.3 mmol/L — ABNORMAL LOW (ref 3.5–5.1)
Sodium: 149 mmol/L — ABNORMAL HIGH (ref 135–145)

## 2016-02-20 LAB — MAGNESIUM: MAGNESIUM: 1.6 mg/dL — AB (ref 1.7–2.4)

## 2016-02-20 LAB — URINE MICROSCOPIC-ADD ON

## 2016-02-20 LAB — URINALYSIS, ROUTINE W REFLEX MICROSCOPIC
Bilirubin Urine: NEGATIVE
Glucose, UA: NEGATIVE mg/dL
KETONES UR: NEGATIVE mg/dL
NITRITE: NEGATIVE
PH: 6 (ref 5.0–8.0)
Protein, ur: 30 mg/dL — AB
SPECIFIC GRAVITY, URINE: 1.011 (ref 1.005–1.030)

## 2016-02-20 LAB — I-STAT CG4 LACTIC ACID, ED
Lactic Acid, Venous: 1.59 mmol/L (ref 0.5–2.0)
Lactic Acid, Venous: 1.84 mmol/L (ref 0.5–2.0)

## 2016-02-20 LAB — LIPASE, BLOOD: LIPASE: 24 U/L (ref 11–51)

## 2016-02-20 LAB — GLUCOSE, CAPILLARY: Glucose-Capillary: 123 mg/dL — ABNORMAL HIGH (ref 65–99)

## 2016-02-20 LAB — I-STAT TROPONIN, ED: TROPONIN I, POC: 0.01 ng/mL (ref 0.00–0.08)

## 2016-02-20 MED ORDER — POTASSIUM CHLORIDE 10 MEQ/100ML IV SOLN
10.0000 meq | Freq: Once | INTRAVENOUS | Status: AC
  Administered 2016-02-20: 10 meq via INTRAVENOUS
  Filled 2016-02-20: qty 100

## 2016-02-20 MED ORDER — ERTAPENEM SODIUM 1 G IJ SOLR
500.0000 mg | INTRAMUSCULAR | Status: DC
Start: 2016-02-20 — End: 2016-02-21
  Administered 2016-02-20: 0.5 g via INTRAVENOUS
  Filled 2016-02-20 (×3): qty 0.5

## 2016-02-20 MED ORDER — SODIUM CHLORIDE 0.9 % IV BOLUS (SEPSIS)
500.0000 mL | Freq: Once | INTRAVENOUS | Status: AC
  Administered 2016-02-20: 500 mL via INTRAVENOUS

## 2016-02-20 MED ORDER — SODIUM CHLORIDE 0.9% FLUSH
10.0000 mL | INTRAVENOUS | Status: DC | PRN
Start: 2016-02-20 — End: 2016-02-21

## 2016-02-20 MED ORDER — HEPARIN SODIUM (PORCINE) 5000 UNIT/ML IJ SOLN
5000.0000 [IU] | Freq: Three times a day (TID) | INTRAMUSCULAR | Status: DC
  Administered 2016-02-20: 5000 [IU] via SUBCUTANEOUS
  Filled 2016-02-20 (×2): qty 1

## 2016-02-20 MED ORDER — SODIUM CHLORIDE 0.45 % IV SOLN
INTRAVENOUS | Status: DC
  Administered 2016-02-20 – 2016-02-21 (×2): via INTRAVENOUS

## 2016-02-20 MED ORDER — ONDANSETRON HCL 4 MG/2ML IJ SOLN
4.0000 mg | Freq: Once | INTRAMUSCULAR | Status: AC
  Administered 2016-02-20: 4 mg via INTRAVENOUS
  Filled 2016-02-20: qty 2

## 2016-02-20 MED ORDER — POTASSIUM CHLORIDE CRYS ER 20 MEQ PO TBCR
40.0000 meq | EXTENDED_RELEASE_TABLET | Freq: Once | ORAL | Status: AC
  Administered 2016-02-20: 40 meq via ORAL
  Filled 2016-02-20: qty 2

## 2016-02-20 NOTE — Progress Notes (Signed)
Skin assessment noted MSAD on perineum & sacrum area.  Bilateral foot wounds wrapped in Kerlix (see pictures in ED notes)

## 2016-02-20 NOTE — Progress Notes (Addendum)
Patient arrived on unit from ED via stretcher.  Foley catheter & right upper arm PICC placed prior to admission. Wife at bedside.  Telemetry placed per MD order and CMT notified. IV team notified of PICC.

## 2016-02-20 NOTE — ED Notes (Signed)
Admitting MD at bedside.

## 2016-02-20 NOTE — Progress Notes (Signed)
[facility]  Patient Status:   Active pt with [facility] prior to this readmission  [facility] is providing the following services: HHRN and [facility] for home IV ABX. [facility] team will follow pt while an inpatient to support DC to home when ordered.  If patient discharges after hours, please call [phone].   Vincent Miranda 02/20/2016, 10:51 PM

## 2016-02-20 NOTE — ED Provider Notes (Signed)
CSN: [id number]     Arrival date & time 02/20/16  1226 History   First MD Initiated Contact with Patient 02/20/16 1240     Chief Complaint  Patient presents with  . Hypotension     (Consider location/radiation/quality/duration/timing/severity/associated sxs/prior Treatment) HPI   Vincent Miranda is a 82 y.o. male with PMH significant for Retention, CKD, anemia of chronic disease, gout who presents via EMS from home with hypotension. Patient reports he was standing in the kitchen this morning when he suddenly became nauseous and hot. He sat down and his wife took his blood pressure with a BP 70s/30s. Upon fire arrival, BP 70/58. Upon EMS arrival, BP 120/54, CBG to 10, heart rate 80.  Currently, he denies any CP, SOB, dizziness, N/V/D, abdominal pain.  Denies fever, foul odor, wound drainage. Wife reports slightly decreased PO intake.   He is currently receiving IV ertapenem for multiple chronic wounds to bilateral lower extremities. He has wounds dressed regularly by [facility].  He also has a foley placed due to inability to void.  He had this placed 02/15/16.   Past Medical History  Diagnosis Date  . Hypertension   . Chronic kidney disease   . Anemia of chronic disease   . Gout   . Acute blood loss anemia 10/18/2013  . History of blood transfusion    Past Surgical History  Procedure Laterality Date  . Eye surgery Right     "had accident; had to replace lens in eye"  . Amputation Left 10/19/2013    Procedure: AMPUTATION RAY  LEFT GREAT TOE;  Surgeon: Nadara Mustard, MD;  Location: [facility] OR;  Service: Orthopedics;  Laterality: Left;   No family history on file. Social History  Substance Use Topics  . Smoking status: Former Smoker -- 1.00 packs/day for 43 years    Types: Cigarettes  . Smokeless tobacco: Never Used     Comment: "quit smoking cigarettes in the 1980s"  . Alcohol Use: Yes     Comment: "nothing anymore" (02/03/2016)    Review of Systems All other systems negative unless  otherwise stated in HPI    Allergies  Review of patient's allergies indicates no known allergies.  Home Medications   Prior to Admission medications   Medication Sig Start Date End Date Taking? Authorizing Provider  alfuzosin (UROXATRAL) 10 MG 24 hr tablet Take 1 tablet (10 mg total) by mouth daily with breakfast. 02/15/16   Hyacinth Meeker, MD  allopurinol (ZYLOPRIM) 100 MG tablet Take 1 tablet (100 mg total) by mouth 2 (two) times daily before a meal. 02/08/16   Fuller Plan, MD  ertapenem 0.5 g in sodium chloride 0.9 % 50 mL Inject 0.5 g into the vein daily. 02/09/16 02/22/16  Tyson Alias, MD  feeding supplement, ENSURE ENLIVE, (ENSURE ENLIVE) LIQD Take 237 mLs by mouth 2 (two) times daily between meals. Patient not taking: Reported on 02/02/2016 04/02/15   Leana Roe Elgergawy, MD  furosemide (LASIX) 40 MG tablet Take 1 tablet (40 mg total) by mouth daily. 02/08/16   Fuller Plan, MD  Multiple Vitamins-Minerals (ZINC PO) Take 1 tablet by mouth daily.    Historical Provider, MD   BP 115/68 mmHg  Pulse 77  Temp(Src) 96.6 F (35.9 C) (Rectal)  Resp 16  Ht  (1.803 m)  Wt 70.761 kg  BMI 21.77 kg/m2  SpO2 96% Physical Exam  Constitutional: He is oriented to person, place, and time. He appears well-developed and well-nourished.  HENT:  Head: Normocephalic and atraumatic.  Mouth/Throat: Oropharynx is clear and moist.  Eyes: Conjunctivae are normal. Pupils are equal, round, and reactive to light.  Neck: Normal range of motion. Neck supple.  Cardiovascular: Normal rate, regular rhythm and normal heart sounds.   No murmur heard. Pulmonary/Chest: Effort normal and breath sounds normal. No accessory muscle usage or stridor. No respiratory distress. He has no wheezes. He has no rhonchi. He has no rales.  Abdominal: Soft. Bowel sounds are normal. He exhibits no distension. There is no tenderness.  Genitourinary:  Foley in place.   Musculoskeletal: Normal range of motion.    Lymphadenopathy:    He has no cervical adenopathy.  Neurological: He is alert and oriented to person, place, and time.  Speech clear without dysarthria.  Skin: Skin is warm and dry.  Chronic bilateral wounds with granulation tissue without foul odor, drainage, or surrounding warmth. See photos below.   Psychiatric: He has a normal mood and affect. His behavior is normal.              ED Course  Procedures (including critical care time) Labs Review Labs Reviewed  COMPREHENSIVE METABOLIC PANEL - Abnormal; Notable for the following:    Sodium 148 (*)    Potassium 2.8 (*)    Chloride 112 (*)    Glucose, Bld 137 (*)    Creatinine, Ser 1.94 (*)    Albumin 1.8 (*)    AST 11 (*)    ALT 7 (*)    GFR calc non Af Amer 29 (*)    GFR calc Af Amer 34 (*)    All other components within normal limits  CBC WITH DIFFERENTIAL/PLATELET - Abnormal; Notable for the following:    RBC 2.71 (*)    Hemoglobin 7.5 (*)    HCT 23.5 (*)    RDW 18.3 (*)    Lymphs Abs 0.5 (*)    All other components within normal limits  URINALYSIS, ROUTINE W REFLEX MICROSCOPIC (NOT AT [facility]) - Abnormal; Notable for the following:    Hgb urine dipstick MODERATE (*)    Protein, ur 30 (*)    Leukocytes, UA TRACE (*)    All other components within normal limits  URINE MICROSCOPIC-ADD ON - Abnormal; Notable for the following:    Squamous Epithelial / LPF 0-5 (*)    Bacteria, UA RARE (*)    Casts HYALINE CASTS (*)    All other components within normal limits  CULTURE, BLOOD (ROUTINE X 2)  CULTURE, BLOOD (ROUTINE X 2)  URINE CULTURE  WOUND CULTURE  LIPASE, BLOOD  MAGNESIUM  I-STAT CG4 LACTIC ACID, ED  Rosezena SensorI-STAT TROPOININ, ED    Imaging Review Dg Chest Port 1 View  02/20/2016  CLINICAL DATA:  Hypotension, near syncopal episode EXAM: PORTABLE CHEST 1 VIEW COMPARISON:  02/07/2016 FINDINGS: right PICC line tip upper SVC level. Stable cardiomegaly without superimposed pneumonia, collapse or consolidation. No edema,  effusion or pneumothorax. Aorta is ectatic and atherosclerotic. Degenerative changes of the spine and shoulders. IMPRESSION: Stable chest exam.  No superimposed acute process Electronically Signed   By: Judie PetitM.  Shick M.D.   On: 02/20/2016 13:45   I have personally reviewed and evaluated these images and lab results as part of my medical decision-making.   EKG Interpretation   Date/Time:  Sunday February 20 2016 12:52:52 EDT Ventricular Rate:  78 PR Interval:  209 QRS Duration: 152 QT Interval:  419 QTC Calculation: 477 R Axis:   -88 Text Interpretation:  Sinus rhythm  Right bundle branch block Inferior  infarct, old Lateral leads are also involved Abnormal ekg Confirmed by  BEATON  MD, Corran ([id number]) on 02/20/2016 2:23:07 PM      MDM   Final diagnoses:  Syncope, unspecified syncope type  Hypotension, unspecified hypotension type  Hypokalemia   Patient presents with hypotension.  VS here BP 124/67, HR 77, RR 20, 96% on RA, temp 96.6. On exam, heart RRR, lungs CTAB, abdomen soft and benign. Chronic appearing wounds to bilateral lower extremities without foul odor, warmth, or drainage. Patient given IVF.  I was contacted when the patient became nauseous. Patient given Zofran. Differential diagnosis includes sepsis, cardiac etiology, dehydration, hypovolemia.  Labs, blood cultures, wound cultures, CXR, EKG ordered.  Labs remarkable for hgb 7.5, appears to be baseline as of recently.  Lactic acid 1.59.  Troponin 0.01. EKG shows SR with RBBB.  CXR negative.  UA with trace leukocytes, WBC 0-5, nitrite negative, rare bacteria.  Urine culture sent.  K 2.8, patient given 40 meq PO and 1 run 10 meq IV.  Magnesium level pending. Cr 1.94, baseline.   Plan to admit to IMTS for syncope, hypotension that has improved with 500 cc NS, and hypokalemia.    Case has been discussed with and seen by Dr. Radford Pax who agrees with the above plan for admission.   Cheri Fowler, PA-C 02/20/16 1539  Nelva Nay,  MD 02/20/16 1540  Nelva Nay, MD 02/20/16 [phone]

## 2016-02-20 NOTE — H&P (Signed)
Date: 02/20/2016               Patient Name:  Vincent Miranda MRN: [id number]  DOB: [birth year] Age / Sex: 82 y.o., male   PCP: No Pcp Per Patient         Medical Service: Internal Medicine Teaching Service         Attending Physician: Dr. Burns Spain, MD    First Contact: Dr. Ladona Ridgel Pager: [phone]  Second Contact: Dr. Beckie Salts Pager: [phone]       After Hours (After 5p/  First Contact Pager: [phone]  weekends / holidays): Second Contact Pager: [phone]   Chief Complaint: hypotension  History of Present Illness: Vincent Miranda is an 82 yo male with CKD3, AoCD, gout, and PAD, recently discharged from [facility] for treatment of multiple lower extremity ulcers.  Although amputation was recommended during that admission, patient elected for home health infusion of antibiotics.  He has been receiving Ertapenem via PICC line for the last 2 weeks, scheduled to complete on 4/4.   Patient and wife report that today, he became dizzy, diaphoretic, pale, and slightly nauseated while eating breakfast.   His BP was 58/37, and he was laid on the floor, with BP improving to sBP 75.  He denies CP, SOB, palpitations, focal weakness, facial droop, slurred speech, or LOC.  BP improved in the ED to 120-140.  He has been intermittently ambulating with walker around the house.  He has been receiving regular home health dressing changes and antibiotics.  He denies fever, chills, drainage, or odor from the wounds.    In the ED, he was noted to have K of 2.8.    Meds: Current Facility-Administered Medications  Medication Dose Route Frequency Provider Last Rate Last Dose  . potassium chloride 10 mEq in 100 mL IVPB  10 mEq Intravenous Once Cheri Fowler, PA-C      . potassium chloride SA (K-DUR,KLOR-CON) CR tablet 40 mEq  40 mEq Oral Once Cheri Fowler, PA-C       Current Outpatient Prescriptions  Medication Sig Dispense Refill  . alfuzosin (UROXATRAL) 10 MG 24 hr tablet Take 1 tablet (10 mg total) by mouth  daily with breakfast. 30 tablet 1  . allopurinol (ZYLOPRIM) 100 MG tablet Take 1 tablet (100 mg total) by mouth 2 (two) times daily before a meal. 60 tablet 0  . ertapenem 0.5 g in sodium chloride 0.9 % 50 mL Inject 0.5 g into the vein daily. 14 Dose 0  . feeding supplement, ENSURE ENLIVE, (ENSURE ENLIVE) LIQD Take 237 mLs by mouth 2 (two) times daily between meals. (Patient not taking: Reported on 02/02/2016) 237 mL 12  . furosemide (LASIX) 40 MG tablet Take 1 tablet (40 mg total) by mouth daily. 30 tablet 0  . Multiple Vitamins-Minerals (ZINC PO) Take 1 tablet by mouth daily.      Allergies: Allergies as of 02/20/2016  . (No Known Allergies)   Past Medical History  Diagnosis Date  . Hypertension   . Chronic kidney disease   . Anemia of chronic disease   . Gout   . Acute blood loss anemia 10/18/2013  . History of blood transfusion    Past Surgical History  Procedure Laterality Date  . Eye surgery Right     "had accident; had to replace lens in eye"  . Amputation Left 10/19/2013    Procedure: AMPUTATION RAY  LEFT GREAT TOE;  Surgeon: Nadara Mustard, MD;  Location: [facility] OR;  Service: Orthopedics;  Laterality: Left;   No family history on file. Social History   Social History  . Marital Status: Married    Spouse Name: N/A  . Number of Children: N/A  . Years of Education: N/A   Occupational History  . Not on file.   Social History Main Topics  . Smoking status: Former Smoker -- 1.00 packs/day for 43 years    Types: Cigarettes  . Smokeless tobacco: Never Used     Comment: "quit smoking cigarettes in the 1980s"  . Alcohol Use: Yes     Comment: "nothing anymore" (02/03/2016)  . Drug Use: No  . Sexual Activity: Not Currently   Other Topics Concern  . Not on file   Social History Narrative    Review of Systems: Pertinent items noted in HPI and remainder of comprehensive ROS otherwise negative.  Physical Exam: Blood pressure 136/83, pulse 85, temperature 96.6 F (35.9  C), temperature source Rectal, resp. rate 18, height 5\' 11"  (1.803 m), weight 156 lb (70.761 kg), SpO2 95 %. Physical Exam  Constitutional: He is oriented to person, place, and time.  Frail, elderly appearing male, lying in bed, NAD  HENT:  Head: Normocephalic and atraumatic.  Eyes: EOM are normal. No scleral icterus.  Neck: No tracheal deviation present.  Cardiovascular: Normal rate, regular rhythm and normal heart sounds.   Heart sounds distant but no murmurs appreciated.  Pulmonary/Chest: Effort normal. No stridor. No respiratory distress. He has no wheezes.  Minimal crackles in bilateral bases.  Abdominal: Soft. He exhibits no distension. There is no tenderness. There is no rebound and no guarding.  Musculoskeletal: He exhibits no edema.  Neurological: He is alert and oriented to person, place, and time.  Skin: Skin is warm and dry.  Bilateral feet bandaged.  Pictures of wounds available in Epic     Lab results: Basic Metabolic Panel:  Recent Labs  78/29/5603/01/06 1340  NA 148*  K 2.8*  CL 112*  CO2 27  GLUCOSE 137*  BUN 16  CREATININE 1.94*  CALCIUM 8.9   Liver Function Tests:  Recent Labs  02/20/16 1340  AST 11*  ALT 7*  ALKPHOS 61  BILITOT 0.5  PROT 7.2  ALBUMIN 1.8*    Recent Labs  02/20/16 1340  LIPASE 24   No results for input(s): AMMONIA in the last 72 hours. CBC:  Recent Labs  02/20/16 1340  WBC 6.0  NEUTROABS 5.0  HGB 7.5*  HCT 23.5*  MCV 86.7  PLT 156   Cardiac Enzymes: No results for input(s): CKTOTAL, CKMB, CKMBINDEX, TROPONINI in the last 72 hours. BNP: No results for input(s): PROBNP in the last 72 hours. D-Dimer: No results for input(s): DDIMER in the last 72 hours. CBG: No results for input(s): GLUCAP in the last 72 hours. Hemoglobin A1C: No results for input(s): HGBA1C in the last 72 hours. Fasting Lipid Panel: No results for input(s): CHOL, HDL, LDLCALC, TRIG, CHOLHDL, LDLDIRECT in the last 72 hours. Thyroid Function  Tests: No results for input(s): TSH, T4TOTAL, FREET4, T3FREE, THYROIDAB in the last 72 hours. Anemia Panel: No results for input(s): VITAMINB12, FOLATE, FERRITIN, TIBC, IRON, RETICCTPCT in the last 72 hours. Coagulation: No results for input(s): LABPROT, INR in the last 72 hours. Urine Drug Screen: Drugs of Abuse  No results found for: LABOPIA, COCAINSCRNUR, LABBENZ, AMPHETMU, THCU, LABBARB  Alcohol Level: No results for input(s): ETH in the last 72 hours. Urinalysis:  Recent Labs  02/20/16 1338  COLORURINE YELLOW  LABSPEC 1.011  PHURINE 6.0  GLUCOSEU NEGATIVE  HGBUR MODERATE*  BILIRUBINUR NEGATIVE  KETONESUR NEGATIVE  PROTEINUR 30*  NITRITE NEGATIVE  LEUKOCYTESUR TRACE*   Misc. Labs:   Imaging results:  Dg Chest Port 1 View  02/20/2016  CLINICAL DATA:  Hypotension, near syncopal episode EXAM: PORTABLE CHEST 1 VIEW COMPARISON:  02/07/2016 FINDINGS: right PICC line tip upper SVC level. Stable cardiomegaly without superimposed pneumonia, collapse or consolidation. No edema, effusion or pneumothorax. Aorta is ectatic and atherosclerotic. Degenerative changes of the spine and shoulders. IMPRESSION: Stable chest exam.  No superimposed acute process Electronically Signed   By: Judie Petit.  Shick M.D.   On: 02/20/2016 13:45    Other results: EKG: RBBB, 1st degree AV block.  Assessment & Plan by Problem: Active Problems:   Hypotension  Vincent Miranda is an 82 yo male with CKD3, AoCD, gout, and PAD, recently discharged from [facility] for treatment of multiple lower extremity ulcers, presenting with hypotension.  Hypotension: Patient had near syncope event with preceding dizziness, diaphoresis, nausea, and pale appearance.  He is also hypernatremic, making etiology most consistent with hypovolemia vs vasovagal.  No preceding chest pain, SOB, or neurologic deficit c/f cardiac, PE, or CVA.  No recent fevers, chills, or signs of worsening infection and sepsis.  Hgb stable at 7-8, consistent with  baseline, but symptomatic anemia is another possibility.  Patient's BP responded to IVF and has been maintaining pressures in the ED.  Will continue to monitor while we replace electrolytes. - Telemetry - Orthostatics - I/Os  Hypokalemia: K 2.8 in the ED.  No changes on ECG.  PO and IV KCl ordered in ED. Will recheck and continue to supplement as needed. - BMP   LE Ulcers: Patient nearing end of two week course of Ertapenem without signs of worsening infection.  Will continue wound care and antibiotics and follow as outpatient.  No acute need to reconsult for amputation. - Ertapenem 500 mg IV daily  CKD3: Cr 1.94 and stable compared to baseline. - CTM  AOCD: Hgb 7.5 and stable compared to baseline. - CTM  FEN/GI: - Regular  DVT Ppx: heparin  Dispo: Disposition is deferred at this time, awaiting improvement of current medical problems. Anticipated discharge in approximately 1 day(s).   The patient does have a current PCP (No Pcp Per Patient) and does need an [facility] hospital follow-up appointment after discharge.  The patient does not have transportation limitations that hinder transportation to clinic appointments.  Signed: Jana Half, MD, PhD 02/20/2016, 4:25 PM

## 2016-02-20 NOTE — Progress Notes (Signed)
Called ED for report, spoke with Sarah. 

## 2016-02-20 NOTE — ED Notes (Signed)
Per EMS- Pt comes from home where he was standing in the kitchen and felt nauseated and hot. He sat down and his wife took his BP with reading in the 70's. Fire dept arrived 70/58. EMS arrived given 250 NS BP 120/54, CBG 210, HR 80. Pt is alert and oriented now. Denies pain. Has PICC line to right upper arm, foley catheter, wounds to BLE.

## 2016-02-21 ENCOUNTER — Telehealth: Payer: Self-pay | Admitting: Internal Medicine

## 2016-02-21 DIAGNOSIS — I959 Hypotension, unspecified: Secondary | ICD-10-CM | POA: Diagnosis not present

## 2016-02-21 DIAGNOSIS — E876 Hypokalemia: Secondary | ICD-10-CM

## 2016-02-21 DIAGNOSIS — L97829 Non-pressure chronic ulcer of other part of left lower leg with unspecified severity: Secondary | ICD-10-CM

## 2016-02-21 LAB — BASIC METABOLIC PANEL
Anion gap: 6 (ref 5–15)
BUN: 14 mg/dL (ref 6–20)
CALCIUM: 8.8 mg/dL — AB (ref 8.9–10.3)
CO2: 26 mmol/L (ref 22–32)
Chloride: 116 mmol/L — ABNORMAL HIGH (ref 101–111)
Creatinine, Ser: 1.82 mg/dL — ABNORMAL HIGH (ref 0.61–1.24)
GFR, EST AFRICAN AMERICAN: 36 mL/min — AB (ref >60)
GFR, EST NON AFRICAN AMERICAN: 31 mL/min — AB (ref >60)
Glucose, Bld: 87 mg/dL (ref 65–99)
Potassium: 4.2 mmol/L (ref 3.5–5.1)
Sodium: 148 mmol/L — ABNORMAL HIGH (ref 135–145)

## 2016-02-21 LAB — URINE CULTURE: Culture: NO GROWTH

## 2016-02-21 LAB — GLUCOSE, CAPILLARY: GLUCOSE-CAPILLARY: 69 mg/dL (ref 65–99)

## 2016-02-21 MED ORDER — HEPARIN SOD (PORK) LOCK FLUSH 100 UNIT/ML IV SOLN
250.0000 [IU] | INTRAVENOUS | Status: AC | PRN
  Administered 2016-02-21: 250 [IU]

## 2016-02-21 MED ORDER — POTASSIUM CHLORIDE 10 MEQ/100ML IV SOLN
10.0000 meq | INTRAVENOUS | Status: AC
  Administered 2016-02-21 (×3): 10 meq via INTRAVENOUS
  Filled 2016-02-21 (×3): qty 100

## 2016-02-21 MED ORDER — POTASSIUM CHLORIDE CRYS ER 20 MEQ PO TBCR
40.0000 meq | EXTENDED_RELEASE_TABLET | Freq: Once | ORAL | Status: AC
  Administered 2016-02-21: 40 meq via ORAL
  Filled 2016-02-21: qty 2

## 2016-02-21 NOTE — Discharge Instructions (Signed)
1. Continue to eat and drink normal amounts of food and fluids. 2. Follow up in the clinic to recheck your potassium on Thursday 4/6.     Hypokalemia Hypokalemia means that the amount of potassium in the blood is lower than normal.Potassium is a chemical, called an electrolyte, that helps regulate the amount of fluid in the body. It also stimulates muscle contraction and helps nerves function properly.Most of the body's potassium is inside of cells, and only a very small amount is in the blood. Because the amount in the blood is so small, minor changes can be life-threatening. CAUSES  Antibiotics.  Diarrhea or vomiting.  Using laxatives too much, which can cause diarrhea.  Chronic kidney disease.  Water pills (diuretics).  Eating disorders (bulimia).  Low magnesium level.  Sweating a lot. SIGNS AND SYMPTOMS  Weakness.  Constipation.  Fatigue.  Muscle cramps.  Mental confusion.  Skipped heartbeats or irregular heartbeat (palpitations).  Tingling or numbness. DIAGNOSIS  Your health care provider can diagnose hypokalemia with blood tests. In addition to checking your potassium level, your health care provider may also check other lab tests. TREATMENT Hypokalemia can be treated with potassium supplements taken by mouth or adjustments in your current medicines. If your potassium level is very low, you may need to get potassium through a vein (IV) and be monitored in the hospital. A diet high in potassium is also helpful. Foods high in potassium are:  Nuts, such as peanuts and pistachios.  Seeds, such as sunflower seeds and pumpkin seeds.  Peas, lentils, and lima beans.  Whole grain and bran cereals and breads.  Fresh fruit and vegetables, such as apricots, avocado, bananas, cantaloupe, kiwi, oranges, tomatoes, asparagus, and potatoes.  Orange and tomato juices.  Red meats.  Fruit yogurt. HOME CARE INSTRUCTIONS  Take all medicines as prescribed by your health  care provider.  Maintain a healthy diet by including nutritious food, such as fruits, vegetables, nuts, whole grains, and lean meats.  If you are taking a laxative, be sure to follow the directions on the label. SEEK MEDICAL CARE IF:  Your weakness gets worse.  You feel your heart pounding or racing.  You are vomiting or having diarrhea.  You are diabetic and having trouble keeping your blood glucose in the normal range. SEEK IMMEDIATE MEDICAL CARE IF:  You have chest pain, shortness of breath, or dizziness.  You are vomiting or having diarrhea for more than 2 days.  You faint. MAKE SURE YOU:   Understand these instructions.  Will watch your condition.  Will get help right away if you are not doing well or get worse.   This information is not intended to replace advice given to you by your health care provider. Make sure you discuss any questions you have with your health care provider.   Document Released: 11/06/2005 Document Revised: 11/27/2014 Document Reviewed: 05/09/2013 Elsevier Interactive Patient Education [employer]2016 Elsevier Inc.

## 2016-02-21 NOTE — Telephone Encounter (Signed)
Needs TOC Pt scheduled for 02/24/16 with dr rice

## 2016-02-21 NOTE — Progress Notes (Signed)
  Date: 02/21/2016  Patient name: Vincent Miranda  Medical record number: [id number]  Date of birth: 10-27-1925   I have seen and evaluated Vincent Miranda and discussed their care with the Residency Team. Mr Vincent Miranda is an 82 yo man with multiple LE ulcers and has elected to pursue with IV ABX (to be completed 4/4) and wound care (Home health daily) rather than the recommended amputation. He lives at home with his wife and is ambulatory with his walker. On AM of admit, while eating breakfast, he bc dizzy, diaphoretic, pale, and slightly nauseated. His BP was 58/37, and he was laid on the floor, with BP improving to sBP 75. He denies CP, SOB, palpitations, focal weakness, facial droop, slurred speech, or LOC. BP improved in the ED to 120-140.Due to hypotension and hypernatremia, along with altered taste and decreased PO intake, the likely etiology was felt to be 2/2 vol depletation and he was hydrated 500 CC in the ED and started on 100 cc / hr. His BP remained stable overnight and he has no sxs this AM other than altered taste. He is anxious to return home.  PMHx, Fam Hx, and/or Soc Hx : Lives at home with his wife. Is ambulatory with his walker.  Filed Vitals:   02/20/16 2209 02/21/16 0643  BP: 124/48 121/59  Pulse: 82 78  Temp: 98.2 F (36.8 C) 98.7 F (37.1 C)  Resp: 18 18   Gen : lying in bed. NAD. Not HOH. Able to give concise hx and medical care preferences. H freq atopic beat but sinus.  LCTAB  Ext bandaged. No edema Neuro : no focal deficits.  Na now 148. K 2.8 on admit, now 4.2  I reviewed the CXR image and EKG with the admitting team and we interpreted the images ourselves, confirming our readings with the official reading.  Assessment and Plan: I have seen and evaluated the patient as outlined above. I agree with the formulated Assessment and Plan as detailed in the residents' note, with the following changes:   1. Hypotension - resolved. Trop and EKG do not  support a cardiac etiology. No indication of sepsis (no fever, LA level nl, no leukocytosis). No indication of CVA event (pt adamant e was walking around the room with walker last night and didn't need to prove to us he could - we needed to worry about things that mattered).   2. Hypernatremia - stable for past 6 months but slowly increasing since 2013. Decreased intake in relation to loss does seem the primary driving force. He is asymptomatic and will follow for now.  3. Hypokalemia - Resolved  4. Chronic leg ulcers - TO complete ABX course. Wound care. Pt and wife no not like the way wound care bandages his legs, so she rewraps them once they leave.   Burns SpainElizabeth A Jaleah Lefevre, MD 4/3/201710:32 AM

## 2016-02-21 NOTE — Progress Notes (Signed)
Patient will need to be set up with a primary care doctor [facility]  Previously, we had hospitalist sign orders however for ongoing wound care orders, he does need a PCP.

## 2016-02-21 NOTE — Progress Notes (Signed)
Internal Medicine Clinic Attending  Case discussed with Dr. Ahmed at the time of the visit.  We reviewed the resident's history and exam and pertinent patient test results.  I agree with the assessment, diagnosis, and plan of care documented in the resident's note. 

## 2016-02-21 NOTE — Progress Notes (Signed)
Subjective: NAEON.  Patient denies chest pain, SOB, dizziness, or lightheadedness.  He feels normal and wants to go home.  Objective: Vital signs in last 24 hours: Filed Vitals:   02/20/16 1755 02/20/16 1814 02/20/16 2209 02/21/16 0643  BP:  116/46 124/48 121/59  Pulse:   82 78  Temp: 98.5 F (36.9 C) 98.4 F (36.9 C) 98.2 F (36.8 C) 98.7 F (37.1 C)  TempSrc: Oral Oral Oral Oral  Resp:  Height:      Weight:   155 lb 4.8 oz (70.444 kg)   SpO2:  93% 93% 93%   Weight change:   Intake/Output Summary (Last 24 hours) at 02/21/16 0957 Last data filed at 02/21/16 0753  Gross per 24 hour  Intake    170 ml  Output    800 ml  Net   -630 ml   Physical Exam  Constitutional: He is oriented to person, place, and time and well-developed, well-nourished, and in no distress. No distress.  Frail, elderly appearing male, sitting in bed, NAD  HENT:  Head: Normocephalic and atraumatic.  Eyes: EOM are normal. No scleral icterus.  Neck: No JVD present. No tracheal deviation present.  Cardiovascular: Normal rate and regular rhythm.   Heart sounds distant. No murmurs appreciated.  Pulmonary/Chest: Effort normal. No stridor. No respiratory distress. He has no wheezes.  Minimal crackles in bilateral bases.  Abdominal: Soft. He exhibits no distension. There is no tenderness. There is no rebound and no guarding.  Musculoskeletal: He exhibits no edema.  Neurological: He is alert and oriented to person, place, and time.  Skin: Skin is warm and dry. He is not diaphoretic.    Lab Results: Basic Metabolic Panel:  Recent Labs Lab 02/20/16 1900 02/21/16 0503  NA 149* 148*  K 3.3* 4.2  CL 112* 116*  CO2 28 26  GLUCOSE 114* 87  BUN 15 14  CREATININE 1.82* 1.82*  CALCIUM 8.7* 8.8*  MG 1.6*  --    Liver Function Tests:  Recent Labs Lab 02/20/16 1340  AST 11*  ALT 7*  ALKPHOS 61  BILITOT 0.5  PROT 7.2  ALBUMIN 1.8*    Recent Labs Lab 02/20/16 1340  LIPASE 24    No results for input(s): AMMONIA in the last 168 hours. CBC:  Recent Labs Lab 02/20/16 1340  WBC 6.0  NEUTROABS 5.0  HGB 7.5*  HCT 23.5*  MCV 86.7  PLT 156   Cardiac Enzymes: No results for input(s): CKTOTAL, CKMB, CKMBINDEX, TROPONINI in the last 168 hours. BNP: No results for input(s): PROBNP in the last 168 hours. D-Dimer: No results for input(s): DDIMER in the last 168 hours. CBG:  Recent Labs Lab 02/20/16 1252 02/21/16 0733  GLUCAP 123* 69   Hemoglobin A1C: No results for input(s): HGBA1C in the last 168 hours. Fasting Lipid Panel: No results for input(s): CHOL, HDL, LDLCALC, TRIG, CHOLHDL, LDLDIRECT in the last 168 hours. Thyroid Function Tests: No results for input(s): TSH, T4TOTAL, FREET4, T3FREE, THYROIDAB in the last 168 hours. Coagulation: No results for input(s): LABPROT, INR in the last 168 hours. Anemia Panel: No results for input(s): VITAMINB12, FOLATE, FERRITIN, TIBC, IRON, RETICCTPCT in the last 168 hours. Urine Drug Screen: Drugs of Abuse  No results found for: LABOPIA, COCAINSCRNUR, LABBENZ, AMPHETMU, THCU, LABBARB  Alcohol Level: No results for input(s): ETH in the last 168 hours. Urinalysis:  Recent Labs Lab 02/15/16 1705 02/20/16 1338  COLORURINE  --  YELLOW  LABSPEC  --  1.011  PHURINE  --  6.0  GLUCOSEU Negative NEGATIVE  HGBUR  --  MODERATE*  BILIRUBINUR Negative NEGATIVE  KETONESUR  --  NEGATIVE  PROTEINUR 1+* 30*  NITRITE Negative NEGATIVE  LEUKOCYTESUR Negative TRACE*   Misc. Labs:   Micro Results: Recent Results (from the past 240 hour(s))  Culture, Urine     Status: None   Collection Time: 02/15/16  5:05 PM  Result Value Ref Range Status   Urine Culture, Routine Final report  Final   Urine Culture result 1 No growth  Final  Microscopic Examination     Status: Abnormal   Collection Time: 02/15/16  5:05 PM  Result Value Ref Range Status   WBC, UA 0-5 0 -  5 /hpf Final   RBC, UA 0-2 0 -  2 /hpf Final   Epithelial  Cells (non renal) None seen 0 - 10 /hpf Final   Casts Present (A) None seen /lpf Final   Cast Type Hyaline casts N/A Final   Mucus, UA Present Not Estab. Final   Bacteria, UA Few None seen/Few Final  Wound culture     Status: None (Preliminary result)   Collection Time: 02/20/16  1:07 PM  Result Value Ref Range Status   Specimen Description FOOT RIGHT  Final   Special Requests FOOT LEFT  Final   Gram Stain   Final    RARE WBC PRESENT, PREDOMINANTLY PMN RARE SQUAMOUS EPITHELIAL CELLS PRESENT NO ORGANISMS SEEN Performed at [facility]    Culture   Final    Culture reincubated for better growth Performed at [facility]    Report Status PENDING  Incomplete  Wound culture     Status: None (Preliminary result)   Collection Time: 02/20/16  4:18 PM  Result Value Ref Range Status   Specimen Description FOOT  Final   Special Requests NONE  Final   Gram Stain   Final    RARE WBC PRESENT, PREDOMINANTLY PMN NO SQUAMOUS EPITHELIAL CELLS SEEN RARE GRAM POSITIVE COCCI IN CLUSTERS Performed at [facility]    Culture   Final    Culture reincubated for better growth Performed at [facility]    Report Status PENDING  Incomplete   Studies/Results: Dg Chest Port 1 View  02/20/2016  CLINICAL DATA:  Hypotension, near syncopal episode EXAM: PORTABLE CHEST 1 VIEW COMPARISON:  02/07/2016 FINDINGS: right PICC line tip upper SVC level. Stable cardiomegaly without superimposed pneumonia, collapse or consolidation. No edema, effusion or pneumothorax. Aorta is ectatic and atherosclerotic. Degenerative changes of the spine and shoulders. IMPRESSION: Stable chest exam.  No superimposed acute process Electronically Signed   By: Judie Petit.  Shick M.D.   On: 02/20/2016 13:45   Medications: I have reviewed the patient's current medications. Scheduled Meds: . ertapenem (INVANZ) IV  500 mg Intravenous Q24H  . heparin  5,000 Units Subcutaneous 3 times per day   Continuous Infusions:    PRN Meds:.sodium chloride flush Assessment/Plan: Principal Problem:   Hypotension Active Problems:   Protein-calorie malnutrition, severe (HCC)   CKD (chronic kidney disease), stage III   Anemia of chronic disease   Dehydration with hypernatremia   Ulcer of left lower leg [facility])  Mr. Busch is an 82 yo male with CKD3, AoCD, gout, and PAD, recently discharged from [facility] for treatment of multiple lower extremity ulcers, presenting with hypotension.  Hypotension: Patient had near syncope event with preceding dizziness, diaphoresis, nausea, and pale appearance. Hypovolemia vs vasovagal. Patient's BP responded to IVF  and has been maintaining pressures in the ED. No events overnight and patient feels good. - Telemetry - I/Os  Hypokalemia, resolved: K 2.8 in the ED. No changes on ECG. Improved to 4.2 this morning.  Will recheck potassium on f/u in clinic later this week.  LE Ulcers: Patient nearing end of two week course of Ertapenem without signs of worsening infection. Will continue wound care and antibiotics and follow as outpatient. No acute need to reconsult for amputation. - Ertapenem 500 mg IV daily  CKD3: Cr 1.94 and stable compared to baseline. - CTM  AOCD: Hgb 7.5 and stable compared to baseline. - CTM  FEN/GI: - Regular  DVT Ppx: heparin  Dispo: Disposition is deferred at this time, awaiting improvement of current medical problems.  Anticipated discharge in approximately 1 day(s).   The patient does have a current PCP (No Pcp Per Patient) and [facility] after discharge.  The patient does not have transportation limitations that hinder transportation to clinic appointments.  .Services Needed at time of discharge: Y = Yes, Blank = No PT:   OT:   RN:   Equipment:   Other:       Jana HalfNicholas A Whisper Kurka, MD 02/21/2016, 9:57 AM

## 2016-02-21 NOTE — Progress Notes (Signed)
Discharge instructions and medications discussed with patient and wife.  All questions answered.  

## 2016-02-21 NOTE — Discharge Summary (Signed)
Name: Vincent Miranda MRN: [id number] DOB: [birth year] 82 y.o. PCP: No Pcp Per Patient  Date of Admission: 02/20/2016 12:26 PM Date of Discharge: 02/21/2016 Attending Physician: Burns Spain, MD  Discharge Diagnosis: 1. Hypotension 2. Hypokalemia 3. Leg ulcers   Principal Problem:   Hypotension Active Problems:   Protein-calorie malnutrition, severe (HCC)   CKD (chronic kidney disease), stage III   Anemia of chronic disease   Dehydration with hypernatremia   Ulcer of left lower leg (HCC)  Discharge Medications:   Medication List    TAKE these medications        alfuzosin 10 MG 24 hr tablet  Commonly known as:  UROXATRAL  Take 1 tablet (10 mg total) by mouth daily with breakfast.     allopurinol 100 MG tablet  Commonly known as:  ZYLOPRIM  Take 1 tablet (100 mg total) by mouth 2 (two) times daily before a meal.     aspirin EC 81 MG tablet  Take 81 mg by mouth every morning.     ertapenem 0.5 g in sodium chloride 0.9 % 50 mL  Inject 0.5 g into the vein daily.     furosemide 40 MG tablet  Commonly known as:  LASIX  Take 1 tablet (40 mg total) by mouth daily.     ZINC PO  Take 1 tablet by mouth daily.        Disposition and follow-up:   Mr.Andrews T Down was discharged from [facility] in Stable condition.  At the hospital follow up visit please address:  1.  Volume status, PO intake, potassium level, leg ulcers, wound bandages, and antibiotics  2.  Labs / imaging needed at time of follow-up: BMP  3.  Pending labs/ test needing follow-up: none  Follow-up Appointments:     Follow-up Information    Follow up with Pincus Badder, MD On 02/24/2016.   Specialty:  Internal Medicine   Why:  1:45pm   Contact information:   [address] [place] [address] [phone]       Discharge Instructions: Discharge Instructions    Call MD for:  extreme fatigue    Complete by:  As directed      Call MD for:  persistant  dizziness or light-headedness    Complete by:  As directed      Call MD for:  temperature >100.4    Complete by:  As directed      Diet - low sodium heart healthy    Complete by:  As directed      Increase activity slowly    Complete by:  As directed            Consultations:    Procedures Performed:  Dg Tibia/fibula Left  02/02/2016  CLINICAL DATA:  Distal lower extremity ulceration EXAM: LEFT TIBIA AND FIBULA - 2 VIEW COMPARISON:  None. FINDINGS: Frontal and lateral views were obtained. There is a large soft tissue ulceration located medial and anterior to the distal tibia and fibula measuring 4.8 x 1.9 cm. A small amount of soft tissue air is seen slightly superior to this large ulceration. There is no erosive change or bony destruction. No fracture or dislocation evident. There is osteoarthritic change in the knee and ankle joint regions. There is marked narrowing in the lateral compartment of the knee joint. There is no abnormal periosteal reaction. IMPRESSION: Large distal soft tissue ulceration anterior and medial in location. A small amount of soft tissue air  is seen just superior to this large ulceration which may indicate spread of infection into the soft tissues slightly more superiorly. No bony lesion. Specifically, no osteomyelitis is evident. No fracture or dislocation. There is arthropathy in the knee and shoulder regions. Electronically Signed   By: Bretta Bang III M.D.   On: 02/02/2016 12:50   Mr Tibia Fibula Left Wo Contrast  02/03/2016  CLINICAL DATA:  Chronic ulcers along the lower legs. Toe amputations. New wound along the left shin. Increased swelling. EXAM: MRI OF LOWER LEFT EXTREMITY WITHOUT CONTRAST TECHNIQUE: Multiplanar, multisequence MR imaging of the left lower leg was performed. No intravenous contrast was administered. COMPARISON:  02/02/2016 FINDINGS: Asymmetric cutaneous ulceration anteriorly along the lower leg just above the level of the ankle, skin defect  approximately 5.3 by 5.0 cm, with a small amount of gas tracking in the soft tissues adjacent to the ulceration and with confluent edema tracking in the adjacent subcutaneous tissues. A lesser amount of subcutaneous edema tracks throughout the calf and there is low-level edema tracking in the calf musculature bilaterally. The tibialis anterior tendon appears discontinuous along the ulceration, images 15-18 series 4. No underlying osteomyelitis or non draining ulcer in this area. There is a suggestion of cutaneous ulceration overlying both right and left medial malleoli. IMPRESSION: 1. Large cutaneous ulceration of the anterior lower leg just above the level of the ankle joint. There is discontinuity of the tibialis anterior tendon in the vicinity of this deep ulceration, as well as confluent subcutaneous edema likely reflecting local surrounding cellulitis. 2. Generalized subcutaneous edema in the calves, left greater than right, and also in the regional muscles. Although possibly from noninfectious causes, the possibility of myositis and cellulitis is difficult to exclude. 3. No non draining abscess is seen. No regional osteomyelitis identified. 4. There is are likely ulcerations overlying both the right and left medial malleoli. Electronically Signed   By: Gaylyn Rong M.D.   On: 02/03/2016 07:48   Dg Chest Port 1 View  02/20/2016  CLINICAL DATA:  Hypotension, near syncopal episode EXAM: PORTABLE CHEST 1 VIEW COMPARISON:  02/07/2016 FINDINGS: right PICC line tip upper SVC level. Stable cardiomegaly without superimposed pneumonia, collapse or consolidation. No edema, effusion or pneumothorax. Aorta is ectatic and atherosclerotic. Degenerative changes of the spine and shoulders. IMPRESSION: Stable chest exam.  No superimposed acute process Electronically Signed   By: Judie Petit.  Shick M.D.   On: 02/20/2016 13:45   Dg Chest Port 1 View  02/07/2016  CLINICAL DATA:  PICC placement. EXAM: PORTABLE CHEST 1 VIEW  COMPARISON:  03/26/2015 FINDINGS: A right PICC has been placed and terminates over the mid SVC. Thoracic aortic tortuosity is similar to the prior study. Cardiac silhouette is within normal limits for portable AP technique. Mild streaky opacity is present in the left greater than right lung bases. No sizable pleural effusion or pneumothorax is identified. No acute osseous abnormality is identified. IMPRESSION: 1. Right PICC terminating over the mid SVC. 2. Mild left basilar opacity which may reflect atelectasis versus infection. Minimal right basilar atelectasis or scarring. Electronically Signed   By: Sebastian Ache M.D.   On: 02/07/2016 18:05   Dg Foot Complete Left  02/02/2016  CLINICAL DATA:  82 year old male with a history of foot wound and prior amputation. EXAM: LEFT FOOT - COMPLETE 3+ VIEW COMPARISON:  10/18/2013, 03/26/2015 FINDINGS: Postoperative changes of prior first ray amputation again noted. In the interval, there has been destructive changes of the distal second metatarsal and distal  third metatarsal, with periosteal reaction and disruption of the third metatarsal phalangeal joint. Destructive changes present at the proximal phalanx of the third toe. Soft tissue swelling at the third toe. Subcutaneous gas extends laterally from the surgical bed, adjacent to the distal second and third metatarsal. No acute fracture is identified. Soft tissue defect on the distal anterior calf, overlying the tibia. Vascular calcifications. IMPRESSION: Destructive changes of the distal second and third metatarsal as well as the proximal phalanx of the third toe, concerning for osteomyelitis given the adjacent soft tissue defect. Postoperative changes of prior left first ray amputation. Soft tissue defect on the lateral view of the anterior distal calf, potentially a second wound. Evidence of tibial and pedal vascular disease. Signed, Yvone Neu. Loreta Ave, DO [facility]  Radiology Electronically Signed   By: Gilmer Mor D.O.   On: 02/02/2016 12:52    2D Echo:   Cardiac Cath:   Admission HPI: Mr. Delapena is an 82 yo male with CKD3, AoCD, gout, and PAD, recently discharged from [facility] for treatment of multiple lower extremity ulcers. Although amputation was recommended during that admission, patient elected for home health infusion of antibiotics. He has been receiving Ertapenem via PICC line for the last 2 weeks, scheduled to complete on 4/4.   Patient and wife report that today, he became dizzy, diaphoretic, pale, and slightly nauseated while eating breakfast. His BP was 58/37, and he was laid on the floor, with BP improving to sBP 75. He denies CP, SOB, palpitations, focal weakness, facial droop, slurred speech, or LOC. BP improved in the ED to 120-140. He has been intermittently ambulating with walker around the house.  He has been receiving regular home health dressing changes and antibiotics. He denies fever, chills, drainage, or odor from the wounds.   In the ED, he was noted to have K of 2.8.   Hospital Course by problem list: Principal Problem:   Hypotension Active Problems:   Protein-calorie malnutrition, severe (HCC)   CKD (chronic kidney disease), stage III   Anemia of chronic disease   Dehydration with hypernatremia   Ulcer of left lower leg (HCC)   Hypotension: BP responded to IVF in the ED, and he was continued overnight on gentle fluids with NS.  His BPs remained 120-140 systolic overnight without complaint of dizziness or SOB.  Symptoms are thought to be due to hypovolemia vs vasovagal.  At discharge, patient denied concern and desired to go home.  Hypokalemia: Potassium on presentation was 2.8 without ECG changes or cardiac symptoms.  He was repleted with PO and IV KCl.  At follow up, please check BMP to ensure potassium level remains normal.  Leg Ulcers: Patient has chronic ischemic LE wounds and chronic osteomyelitis. He has  been receiving Ertapenem via PICC for 2 weeks, scheduled to end 4/4.  There were no signs of worsening infection and patient was discharged to continue his home antibiotic schedule.  Discharge Vitals:   BP 121/59 mmHg  Pulse 78  Temp(Src) 98.7 F (37.1 C) (Oral)  Resp 18  Ht 5\' 11"  (1.803 m)  Wt 155 lb 4.8 oz (70.444 kg)  BMI 21.67 kg/m2  SpO2 93%  Discharge Labs:  Results for orders placed or performed during the hospital encounter of 02/20/16 (from the past 24 hour(s))  Glucose, capillary     Status: Abnormal   Collection Time: 02/20/16 12:52 PM  Result Value Ref Range   Glucose-Capillary 123 (H) 65 - 99 mg/dL  Comment 1 Call MD NNP PA CNM   Wound culture     Status: None (Preliminary result)   Collection Time: 02/20/16  1:07 PM  Result Value Ref Range   Specimen Description FOOT RIGHT    Special Requests FOOT LEFT    Gram Stain      RARE WBC PRESENT, PREDOMINANTLY PMN RARE SQUAMOUS EPITHELIAL CELLS PRESENT NO ORGANISMS SEEN Performed at [facility]    Culture      Culture reincubated for better growth Performed at [facility]    Report Status PENDING   Urinalysis, Routine w reflex microscopic (not at [facility])     Status: Abnormal   Collection Time: 02/20/16  1:38 PM  Result Value Ref Range   Color, Urine YELLOW YELLOW   APPearance CLEAR CLEAR   Specific Gravity, Urine 1.011 1.005 - 1.030   pH 6.0 5.0 - 8.0   Glucose, UA NEGATIVE NEGATIVE mg/dL   Hgb urine dipstick MODERATE (A) NEGATIVE   Bilirubin Urine NEGATIVE NEGATIVE   Ketones, ur NEGATIVE NEGATIVE mg/dL   Protein, ur 30 (A) NEGATIVE mg/dL   Nitrite NEGATIVE NEGATIVE   Leukocytes, UA TRACE (A) NEGATIVE  Urine microscopic-add on     Status: Abnormal   Collection Time: 02/20/16  1:38 PM  Result Value Ref Range   Squamous Epithelial / LPF 0-5 (A) NONE SEEN   WBC, UA 0-5 0 - 5 WBC/hpf   RBC / HPF 6-30 0 - 5 RBC/hpf   Bacteria, UA RARE (A) NONE SEEN   Casts HYALINE CASTS (A) NEGATIVE    Comprehensive metabolic panel     Status: Abnormal   Collection Time: 02/20/16  1:40 PM  Result Value Ref Range   Sodium 148 (H) 135 - 145 mmol/L   Potassium 2.8 (L) 3.5 - 5.1 mmol/L   Chloride 112 (H) 101 - 111 mmol/L   CO2 27 22 - 32 mmol/L   Glucose, Bld 137 (H) 65 - 99 mg/dL   BUN 16 6 - 20 mg/dL   Creatinine, Ser 4.091.94 (H) 0.61 - 1.24 mg/dL   Calcium 8.9 8.9 - 81.110.3 mg/dL   Total Protein 7.2 6.5 - 8.1 g/dL   Albumin 1.8 (L) 3.5 - 5.0 g/dL   AST 11 (L) 15 - 41 U/L   ALT 7 (L) 17 - 63 U/L   Alkaline Phosphatase 61 38 - 126 U/L   Total Bilirubin 0.5 0.3 - 1.2 mg/dL   GFR calc non Af Amer 29 (L) >60 mL/min   GFR calc Af Amer 34 (L) >60 mL/min   Anion gap 9 5 - 15  CBC WITH DIFFERENTIAL     Status: Abnormal   Collection Time: 02/20/16  1:40 PM  Result Value Ref Range   WBC 6.0 4.0 - 10.5 K/uL   RBC 2.71 (L) 4.22 - 5.81 MIL/uL   Hemoglobin 7.5 (L) 13.0 - 17.0 g/dL   HCT 91.423.5 (L) 78.239.0 - 95.652.0 %   MCV 86.7 78.0 - 100.0 fL   MCH 27.7 26.0 - 34.0 pg   MCHC 31.9 30.0 - 36.0 g/dL   RDW 21.318.3 (H) 08.611.5 - 57.815.5 %   Platelets 156 150 - 400 K/uL   Neutrophils Relative % 84 %   Neutro Abs 5.0 1.7 - 7.7 K/uL   Lymphocytes Relative 9 %   Lymphs Abs 0.5 (L) 0.7 - 4.0 K/uL   Monocytes Relative 5 %   Monocytes Absolute 0.3 0.1 - 1.0 K/uL   Eosinophils Relative 2 %  Eosinophils Absolute 0.1 0.0 - 0.7 K/uL   Basophils Relative 0 %   Basophils Absolute 0.0 0.0 - 0.1 K/uL  Lipase, blood     Status: None   Collection Time: 02/20/16  1:40 PM  Result Value Ref Range   Lipase 24 11 - 51 U/L  I-stat troponin, ED (not at [facility])     Status: None   Collection Time: 02/20/16  1:47 PM  Result Value Ref Range   Troponin i, poc 0.01 0.00 - 0.08 ng/mL   Comment 3          I-Stat CG4 Lactic Acid, ED  (not at  [facility])     Status: None   Collection Time: 02/20/16  1:49 PM  Result Value Ref Range   Lactic Acid, Venous 1.59 0.5 - 2.0 mmol/L  I-Stat CG4 Lactic Acid, ED  (not at  [facility])     Status: None    Collection Time: 02/20/16  4:42 PM  Result Value Ref Range   Lactic Acid, Venous 1.84 0.5 - 2.0 mmol/L  Magnesium     Status: Abnormal   Collection Time: 02/20/16  7:00 PM  Result Value Ref Range   Magnesium 1.6 (L) 1.7 - 2.4 mg/dL  Basic metabolic panel     Status: Abnormal   Collection Time: 02/20/16  7:00 PM  Result Value Ref Range   Sodium 149 (H) 135 - 145 mmol/L   Potassium 3.3 (L) 3.5 - 5.1 mmol/L   Chloride 112 (H) 101 - 111 mmol/L   CO2 28 22 - 32 mmol/L   Glucose, Bld 114 (H) 65 - 99 mg/dL   BUN 15 6 - 20 mg/dL   Creatinine, Ser 1.61 (H) 0.61 - 1.24 mg/dL   Calcium 8.7 (L) 8.9 - 10.3 mg/dL   GFR calc non Af Amer 31 (L) >60 mL/min   GFR calc Af Amer 36 (L) >60 mL/min   Anion gap 9 5 - 15  Basic metabolic panel     Status: Abnormal   Collection Time: 02/21/16  5:03 AM  Result Value Ref Range   Sodium 148 (H) 135 - 145 mmol/L   Potassium 4.2 3.5 - 5.1 mmol/L   Chloride 116 (H) 101 - 111 mmol/L   CO2 26 22 - 32 mmol/L   Glucose, Bld 87 65 - 99 mg/dL   BUN 14 6 - 20 mg/dL   Creatinine, Ser 0.96 (H) 0.61 - 1.24 mg/dL   Calcium 8.8 (L) 8.9 - 10.3 mg/dL   GFR calc non Af Amer 31 (L) >60 mL/min   GFR calc Af Amer 36 (L) >60 mL/min   Anion gap 6 5 - 15  Glucose, capillary     Status: None   Collection Time: 02/21/16  7:33 AM  Result Value Ref Range   Glucose-Capillary 69 65 - 99 mg/dL   Comment 1 Notify RN    Comment 2 Document in Chart     Signed: Jana Half, MD 02/21/2016, 10:04 AM    Services Ordered on Discharge: HH PT, RN Equipment Ordered on Discharge: none

## 2016-02-22 DIAGNOSIS — N401 Enlarged prostate with lower urinary tract symptoms: Secondary | ICD-10-CM | POA: Diagnosis not present

## 2016-02-22 DIAGNOSIS — R351 Nocturia: Secondary | ICD-10-CM | POA: Diagnosis not present

## 2016-02-22 DIAGNOSIS — R338 Other retention of urine: Secondary | ICD-10-CM | POA: Diagnosis not present

## 2016-02-22 NOTE — Telephone Encounter (Signed)
Transition Care Management Follow-up Telephone Call   (COMPLETED WITH PT SPOUSE)   Date discharged?   4/3   How have you been since you were released from the hospital? "pretty good, went to urology today, we will be ready for your appointment Thursday"   Do you understand why you were in the hospital? yes   Do you understand the discharge instructions? yes   Where were you discharged to? Home   Items Reviewed:  Medications reviewed: yes  Allergies reviewed: yes  Dietary changes reviewed: yes  Referrals reviewed: yes   Functional Questionnaire:   Activities of Daily Living (ADLs):   He states they are independent in the following: n/a States they require assistance with the following: ambulation, bathing and hygiene, grooming, toileting and dressing   Any transportation issues/concerns?: no   Any patient concerns? No- wife confirms [facility] RN and PT have reached out to them, patient has not had any more dizzy spells, is eating and drinking to prevent dehydration.  Prepared for lab work to recheck his chemistry later in the week.  They have no concerns with wounds, she feels comfortable with completing dressing changes [facility]   Confirmed importance and date/time of follow-up visits scheduled yes  Provider Appointment booked with  Dr. Dimple Caseyice 1:45 02/24/16  Confirmed with patient if condition begins to worsen call PCP or go to the ER.  Patient was given the office number and encouraged to call back with question or concerns.  : yes

## 2016-02-23 DIAGNOSIS — A419 Sepsis, unspecified organism: Secondary | ICD-10-CM | POA: Diagnosis not present

## 2016-02-23 DIAGNOSIS — T8754 Necrosis of amputation stump, left lower extremity: Secondary | ICD-10-CM | POA: Diagnosis not present

## 2016-02-23 DIAGNOSIS — I70263 Atherosclerosis of native arteries of extremities with gangrene, bilateral legs: Secondary | ICD-10-CM | POA: Diagnosis not present

## 2016-02-23 DIAGNOSIS — L03116 Cellulitis of left lower limb: Secondary | ICD-10-CM | POA: Diagnosis not present

## 2016-02-23 DIAGNOSIS — M86472 Chronic osteomyelitis with draining sinus, left ankle and foot: Secondary | ICD-10-CM | POA: Diagnosis not present

## 2016-02-23 DIAGNOSIS — T8753 Necrosis of amputation stump, right lower extremity: Secondary | ICD-10-CM | POA: Diagnosis not present

## 2016-02-23 LAB — WOUND CULTURE

## 2016-02-24 ENCOUNTER — Ambulatory Visit (INDEPENDENT_AMBULATORY_CARE_PROVIDER_SITE_OTHER): Payer: Medicare Other | Admitting: Internal Medicine

## 2016-02-24 ENCOUNTER — Inpatient Hospital Stay (HOSPITAL_COMMUNITY)
Admission: EM | Admit: 2016-02-24 | Discharge: 2016-02-28 | DRG: 308 | Disposition: A | Discharge status: Home / Self Care | Payer: Medicare Other | Attending: Internal Medicine | Admitting: Internal Medicine

## 2016-02-24 ENCOUNTER — Encounter: Payer: Self-pay | Admitting: Internal Medicine

## 2016-02-24 ENCOUNTER — Other Ambulatory Visit (HOSPITAL_COMMUNITY): Payer: Self-pay

## 2016-02-24 ENCOUNTER — Telehealth: Payer: Self-pay

## 2016-02-24 ENCOUNTER — Encounter (HOSPITAL_COMMUNITY): Payer: Self-pay | Admitting: Emergency Medicine

## 2016-02-24 ENCOUNTER — Other Ambulatory Visit: Payer: Self-pay

## 2016-02-24 ENCOUNTER — Inpatient Hospital Stay (HOSPITAL_COMMUNITY): Payer: Medicare Other

## 2016-02-24 ENCOUNTER — Emergency Department (HOSPITAL_COMMUNITY): Payer: Medicare Other

## 2016-02-24 VITALS — BP 76/58 | HR 71 | Temp 97.9°F | Ht 71.0 in

## 2016-02-24 DIAGNOSIS — N179 Acute kidney failure, unspecified: Secondary | ICD-10-CM | POA: Diagnosis present

## 2016-02-24 DIAGNOSIS — E870 Hyperosmolality and hypernatremia: Secondary | ICD-10-CM | POA: Diagnosis present

## 2016-02-24 DIAGNOSIS — E876 Hypokalemia: Secondary | ICD-10-CM | POA: Diagnosis present

## 2016-02-24 DIAGNOSIS — L97829 Non-pressure chronic ulcer of other part of left lower leg with unspecified severity: Secondary | ICD-10-CM | POA: Diagnosis present

## 2016-02-24 DIAGNOSIS — D638 Anemia in other chronic diseases classified elsewhere: Secondary | ICD-10-CM | POA: Diagnosis present

## 2016-02-24 DIAGNOSIS — Z87891 Personal history of nicotine dependence: Secondary | ICD-10-CM

## 2016-02-24 DIAGNOSIS — A419 Sepsis, unspecified organism: Secondary | ICD-10-CM | POA: Diagnosis present

## 2016-02-24 DIAGNOSIS — I959 Hypotension, unspecified: Secondary | ICD-10-CM | POA: Diagnosis present

## 2016-02-24 DIAGNOSIS — I4891 Unspecified atrial fibrillation: Secondary | ICD-10-CM

## 2016-02-24 DIAGNOSIS — R42 Dizziness and giddiness: Secondary | ICD-10-CM | POA: Diagnosis not present

## 2016-02-24 DIAGNOSIS — I70249 Atherosclerosis of native arteries of left leg with ulceration of unspecified site: Secondary | ICD-10-CM | POA: Diagnosis present

## 2016-02-24 DIAGNOSIS — N183 Chronic kidney disease, stage 3 (moderate): Secondary | ICD-10-CM | POA: Diagnosis present

## 2016-02-24 DIAGNOSIS — I480 Paroxysmal atrial fibrillation: Principal | ICD-10-CM | POA: Diagnosis present

## 2016-02-24 DIAGNOSIS — Z7982 Long term (current) use of aspirin: Secondary | ICD-10-CM | POA: Diagnosis not present

## 2016-02-24 DIAGNOSIS — E861 Hypovolemia: Secondary | ICD-10-CM | POA: Diagnosis present

## 2016-02-24 DIAGNOSIS — D649 Anemia, unspecified: Secondary | ICD-10-CM | POA: Diagnosis not present

## 2016-02-24 DIAGNOSIS — E860 Dehydration: Secondary | ICD-10-CM | POA: Diagnosis present

## 2016-02-24 DIAGNOSIS — I129 Hypertensive chronic kidney disease with stage 1 through stage 4 chronic kidney disease, or unspecified chronic kidney disease: Secondary | ICD-10-CM | POA: Diagnosis present

## 2016-02-24 DIAGNOSIS — R4182 Altered mental status, unspecified: Secondary | ICD-10-CM | POA: Diagnosis not present

## 2016-02-24 LAB — COMPREHENSIVE METABOLIC PANEL
ALBUMIN: 1.9 g/dL — AB (ref 3.5–5.0)
ALK PHOS: 72 U/L (ref 38–126)
ALT: 6 U/L — AB (ref 17–63)
AST: 13 U/L — AB (ref 15–41)
Anion gap: 11 (ref 5–15)
BILIRUBIN TOTAL: 0.5 mg/dL (ref 0.3–1.2)
BUN: 19 mg/dL (ref 6–20)
CO2: 29 mmol/L (ref 22–32)
CREATININE: 2.22 mg/dL — AB (ref 0.61–1.24)
Calcium: 9.3 mg/dL (ref 8.9–10.3)
Chloride: 110 mmol/L (ref 101–111)
GFR calc Af Amer: 29 mL/min — ABNORMAL LOW (ref >60)
GFR, EST NON AFRICAN AMERICAN: 25 mL/min — AB (ref >60)
GLUCOSE: 115 mg/dL — AB (ref 65–99)
POTASSIUM: 3 mmol/L — AB (ref 3.5–5.1)
Sodium: 150 mmol/L — ABNORMAL HIGH (ref 135–145)
TOTAL PROTEIN: 7.4 g/dL (ref 6.5–8.1)

## 2016-02-24 LAB — CBC WITH DIFFERENTIAL/PLATELET
BASOS ABS: 0 10*3/uL (ref 0.0–0.1)
Basophils Relative: 0 %
EOS ABS: 0.3 10*3/uL (ref 0.0–0.7)
EOS PCT: 4 %
HCT: 26.5 % — ABNORMAL LOW (ref 39.0–52.0)
Hemoglobin: 8.2 g/dL — ABNORMAL LOW (ref 13.0–17.0)
LYMPHS PCT: 15 %
Lymphs Abs: 1 10*3/uL (ref 0.7–4.0)
MCH: 27.1 pg (ref 26.0–34.0)
MCHC: 30.9 g/dL (ref 30.0–36.0)
MCV: 87.5 fL (ref 78.0–100.0)
MONO ABS: 0.6 10*3/uL (ref 0.1–1.0)
Monocytes Relative: 8 %
Neutro Abs: 4.9 10*3/uL (ref 1.7–7.7)
Neutrophils Relative %: 73 %
PLATELETS: 146 10*3/uL — AB (ref 150–400)
RBC: 3.03 MIL/uL — ABNORMAL LOW (ref 4.22–5.81)
RDW: 17.9 % — AB (ref 11.5–15.5)
WBC: 6.7 10*3/uL (ref 4.0–10.5)

## 2016-02-24 LAB — I-STAT ARTERIAL BLOOD GAS, ED
ACID-BASE EXCESS: 2 mmol/L (ref 0.0–2.0)
BICARBONATE: 27.9 meq/L — AB (ref 20.0–24.0)
O2 Saturation: 97 %
TCO2: 29 mmol/L (ref 0–100)
pCO2 arterial: 46.9 mmHg — ABNORMAL HIGH (ref 35.0–45.0)
pH, Arterial: 7.381 (ref 7.350–7.450)
pO2, Arterial: 91 mmHg (ref 80.0–100.0)

## 2016-02-24 LAB — TROPONIN I
TROPONIN I: 0.06 ng/mL — AB (ref <0.031)
Troponin I: 0.07 ng/mL — ABNORMAL HIGH (ref <0.031)

## 2016-02-24 LAB — LACTIC ACID, PLASMA: Lactic Acid, Venous: 0.9 mmol/L (ref 0.5–2.0)

## 2016-02-24 LAB — I-STAT CG4 LACTIC ACID, ED: LACTIC ACID, VENOUS: 2.51 mmol/L — AB (ref 0.5–2.0)

## 2016-02-24 LAB — MAGNESIUM: Magnesium: 1.7 mg/dL (ref 1.7–2.4)

## 2016-02-24 MED ORDER — SODIUM CHLORIDE 0.9% FLUSH
3.0000 mL | Freq: Two times a day (BID) | INTRAVENOUS | Status: DC
  Administered 2016-02-25 – 2016-02-28 (×7): 3 mL via INTRAVENOUS

## 2016-02-24 MED ORDER — DEXTROSE 5 % IV SOLN
1.0000 g | INTRAVENOUS | Status: DC
  Administered 2016-02-24: 1 g via INTRAVENOUS
  Filled 2016-02-24: qty 1

## 2016-02-24 MED ORDER — POTASSIUM CHLORIDE 10 MEQ/100ML IV SOLN
10.0000 meq | INTRAVENOUS | Status: AC
  Administered 2016-02-24 (×2): 10 meq via INTRAVENOUS
  Filled 2016-02-24 (×2): qty 100

## 2016-02-24 MED ORDER — PROCHLORPERAZINE EDISYLATE 5 MG/ML IJ SOLN
5.0000 mg | Freq: Once | INTRAMUSCULAR | Status: AC
  Administered 2016-02-24: 5 mg via INTRAVENOUS
  Filled 2016-02-24: qty 2

## 2016-02-24 MED ORDER — HEPARIN (PORCINE) IN NACL 100-0.45 UNIT/ML-% IJ SOLN
1200.0000 [IU]/h | INTRAMUSCULAR | Status: DC
  Administered 2016-02-24: 1050 [IU]/h via INTRAVENOUS
  Filled 2016-02-24: qty 250

## 2016-02-24 MED ORDER — DILTIAZEM HCL 100 MG IV SOLR
INTRAVENOUS | Status: AC
  Filled 2016-02-24: qty 100

## 2016-02-24 MED ORDER — ONDANSETRON HCL 4 MG/2ML IJ SOLN: 4.0000 mg | Freq: Four times a day (QID) | INTRAMUSCULAR | Status: DC | PRN

## 2016-02-24 MED ORDER — SODIUM CHLORIDE 0.9 % IV BOLUS (SEPSIS)
1000.0000 mL | Freq: Once | INTRAVENOUS | Status: AC
  Administered 2016-02-24: 1000 mL via INTRAVENOUS

## 2016-02-24 MED ORDER — PIPERACILLIN-TAZOBACTAM 3.375 G IVPB 30 MIN
3.3750 g | Freq: Once | INTRAVENOUS | Status: AC
  Administered 2016-02-24: 3.375 g via INTRAVENOUS
  Filled 2016-02-24: qty 50

## 2016-02-24 MED ORDER — ONDANSETRON HCL 4 MG/2ML IJ SOLN
4.0000 mg | Freq: Once | INTRAMUSCULAR | Status: AC
  Administered 2016-02-24: 4 mg via INTRAVENOUS

## 2016-02-24 MED ORDER — VANCOMYCIN HCL 10 G IV SOLR
1750.0000 mg | Freq: Once | INTRAVENOUS | Status: AC
  Administered 2016-02-24: 1750 mg via INTRAVENOUS
  Filled 2016-02-24 (×2): qty 1750

## 2016-02-24 MED ORDER — DEXTROSE 5 % IV SOLN
5.0000 mg/h | INTRAVENOUS | Status: DC
  Administered 2016-02-24: 5 mg/h via INTRAVENOUS

## 2016-02-24 MED ORDER — HEPARIN SODIUM (PORCINE) 5000 UNIT/ML IJ SOLN: 5000.0000 [IU] | Freq: Three times a day (TID) | INTRAMUSCULAR | Status: DC

## 2016-02-24 MED ORDER — DILTIAZEM LOAD VIA INFUSION
10.0000 mg | Freq: Once | INTRAVENOUS | Status: AC
  Administered 2016-02-24: 10 mg via INTRAVENOUS

## 2016-02-24 MED ORDER — SODIUM CHLORIDE 0.9 % IV SOLN
INTRAVENOUS | Status: AC
  Administered 2016-02-24 – 2016-02-25 (×2): via INTRAVENOUS

## 2016-02-24 MED ORDER — VANCOMYCIN HCL IN DEXTROSE 750-5 MG/150ML-% IV SOLN: 750.0000 mg | INTRAVENOUS | Status: DC

## 2016-02-24 MED ORDER — CETYLPYRIDINIUM CHLORIDE 0.05 % MT LIQD
7.0000 mL | Freq: Two times a day (BID) | OROMUCOSAL | Status: DC
  Administered 2016-02-26 – 2016-02-28 (×4): 7 mL via OROMUCOSAL

## 2016-02-24 MED ORDER — ONDANSETRON HCL 4 MG/2ML IJ SOLN
INTRAMUSCULAR | Status: AC
  Filled 2016-02-24: qty 2

## 2016-02-24 MED ORDER — ONDANSETRON HCL 4 MG PO TABS: 4.0000 mg | ORAL_TABLET | Freq: Four times a day (QID) | ORAL | Status: DC | PRN

## 2016-02-24 MED ORDER — SODIUM CHLORIDE 0.9 % IV BOLUS (SEPSIS)
30.0000 mL/kg | Freq: Once | INTRAVENOUS | Status: AC
  Administered 2016-02-24: 2124 mL via INTRAVENOUS

## 2016-02-24 MED ORDER — HEPARIN BOLUS VIA INFUSION
4000.0000 [IU] | Freq: Once | INTRAVENOUS | Status: AC
  Administered 2016-02-24: 4000 [IU] via INTRAVENOUS
  Filled 2016-02-24: qty 4000

## 2016-02-24 MED ORDER — ASPIRIN EC 81 MG PO TBEC
81.0000 mg | DELAYED_RELEASE_TABLET | ORAL | Status: DC
  Administered 2016-02-25 – 2016-02-28 (×4): 81 mg via ORAL
  Filled 2016-02-24 (×5): qty 1

## 2016-02-24 NOTE — Progress Notes (Signed)
Subjective:   Patient ID: Vincent Miranda male   DOB: 02/18/1925 82 y.o.   MRN: [id number]  HPI: Vincent Miranda is a 82 y.o. man with PMHx detailed below presenting to the clinic for hospital follow up. He was recently hospitalized for infection of chronic lower extremity ulcers with systemic symptoms of hypotension and altered mental status. He was recommended to undergo amputation by multiple surgeons but refused. As such he was discharged with a PICC line on ertapenem for 2 weeks after stabilization and hydrotherapy debridement in the hospital. He was readmitted on 4/2 for syncope, hypotension, and hypokalemia. His symptoms improved with IV fluid resuscitation and potassium replacement and he was discharged home on 4/3. He completed his IV antibiotics but of note his wound culture data from 4/2 indicates a carbapenem resistant pseudomonas that would not have been adequately treated on his regimen.  He was apparently doing well at home since his recent discharge mostly lying in bed. After getting up today around noon to prepare for a clinic visit he has been decreasingly alert and more weak. In the clinic he is notably hypotensive and spontaneously desaturating on pulse oximetry due to apnea episodes. He looks very ill and unstable and his mental status is altered not answering any questions.  Recommended urgent transfer to the ED for start of Code Sepsis management and subsequent admission to the hospital. His wife is wants to continue aggressive medical treatment of his illness and agrees with this plan.  *History is obtained from his wife and chart review as the patient is unresponsive to questions.   Past Medical History  Diagnosis Date  . Hypertension   . Chronic kidney disease   . Anemia of chronic disease   . Gout   . Acute blood loss anemia 10/18/2013  . History of blood transfusion    No current facility-administered medications for this visit.   Current Outpatient  Prescriptions  Medication Sig Dispense Refill  . alfuzosin (UROXATRAL) 10 MG 24 hr tablet Take 1 tablet (10 mg total) by mouth daily with breakfast. 30 tablet 1  . allopurinol (ZYLOPRIM) 100 MG tablet Take 1 tablet (100 mg total) by mouth 2 (two) times daily before a meal. 60 tablet 0  . aspirin EC 81 MG tablet Take 81 mg by mouth every morning.    . furosemide (LASIX) 40 MG tablet Take 1 tablet (40 mg total) by mouth daily. 30 tablet 0  . Multiple Vitamins-Minerals (ZINC PO) Take 1 tablet by mouth daily.     Facility-Administered Medications Ordered in Other Visits  Medication Dose Route Frequency Provider Last Rate Last Dose  . diltiazem (CARDIZEM) 1 mg/mL load via infusion 10 mg  10 mg Intravenous Once Jenifer Ernestina PennaBrunno Irick, MD       And  . diltiazem (CARDIZEM) 100 mg in dextrose 5 % 100 mL (1 mg/mL) infusion  5-15 mg/hr Intravenous Continuous Jenifer Ernestina PennaBrunno Irick, MD       No family history on file. Social History   Social History  . Marital Status: Married    Spouse Name: N/A  . Number of Children: N/A  . Years of Education: N/A   Social History Main Topics  . Smoking status: Former Smoker -- 1.00 packs/day for 43 years    Types: Cigarettes  . Smokeless tobacco: Never Used     Comment: "quit smoking cigarettes in the 1980s"  . Alcohol Use: Yes     Comment: "nothing anymore" (02/03/2016)  . Drug Use: No  .  Sexual Activity: Not Currently   Other Topics Concern  . None   Social History Narrative   Review of Systems: Unable to complete due to patient minimally responding to questions Review of Systems  Unable to perform ROS: acuity of condition    Objective:  Physical Exam: Filed Vitals:   02/24/16 1349 02/24/16 1435  BP: 64/38 76/58  Pulse: 67 71  Temp: 97.9 F (36.6 C)   TempSrc: Oral   Height:  (1.803 m)    GENERAL- Somnolent, nonverbal, very ill appearing elderly man, intermittently holding his head in his hands CARDIAC- RRR, no murmurs, rubs or  gallops. RESP- Shallow, irregular breaths, CTAB EXTREMITIES- Left leg ulcer with healthy appearing wound base tissue and some thick yellow drainage, no pedal edema, bilateral feet s/p 1st digit amputations SKIN- Cool, poor skin turgor PSYCH- Not oriented or engagable  Assessment & Plan:   Transfer to Emergency Department to begin treatment and admit to hospital for sepsis presentation with AMS, hypotension, hypoxia, tachycardia.

## 2016-02-24 NOTE — Progress Notes (Signed)
Pt came up from ER at 1855 with wounds to BLE (feet) open to air, no dressings covering wounds.  All wounds pink, dry, no drainage at time of arrival, note yellow dry drainage on sheets in feet area.  Measurements of wounds done by Devota PaceKaylee Quick, RN.    Left front side (anterior side) 9cm x 6.5cm  Left side foot 6cm x 10cm  Left side foot small area 1.5cm x 1.5cm  Right inner foot 7cm x 8cm  Right outer foot 3cm x 5cm  Right foot anterior 1cm x 2cm    All open wounds covered with moist NS gauze and covered with abd pad and kerlix wrapped, pt tolerated well.

## 2016-02-24 NOTE — ED Notes (Signed)
Pt sent here from internal medicine appointment today. Pt hypotensive, systolic BP 50's. Pt recently on antibiotics for wounds on his feet. Internal med concerned for sepsis. Pt alert

## 2016-02-24 NOTE — ED Provider Notes (Signed)
CSN: [id number]     Arrival date & time 4/6/Vincent  1425 History   First MD Initiated Contact with Patient 04/06/Vincent 1451     Chief Complaint  Patient presents with  . Hypotension      HPI  82 y.o. Miranda with history of hypertension, CKD, and recent admission to the hospital for sepsis secondary to chronic foot ulcers, who presents at the request of his PCP after he was noted to have altered mental status and hypotension. Patient's wife reports that he has been slightly more confused and sleepy than usual throughout the day today. She took his blood pressure at home and it was 50/30, prompting her to bring him in to his PCP.  Patient's wife reports that he recently finished his home IV ertapenem several days ago. He has been doing well at home up until today, but has been staying in bed most of the time due to fatigue and generalized weakness. He has no focal weakness or sensory deficits. Patient is able to answer simple questions but appears very fatigued. Patient's wife denies fevers. He vomited several times throughout the day today, nonbloody nonbilious. Denies chest pain, short of breath, abdominal pain, diarrhea.   Past Medical History  Diagnosis Date  . Hypertension   . Chronic kidney disease   . Anemia of chronic disease   . Gout   . Acute blood loss anemia 10/18/2013  . History of blood transfusion    Past Surgical History  Procedure Laterality Date  . Eye surgery Right     "had accident; had to replace lens in eye"  . Amputation Left 10/19/2013    Procedure: AMPUTATION RAY  LEFT GREAT TOE;  Surgeon: Nadara Mustard, MD;  Location: [facility] OR;  Service: Orthopedics;  Laterality: Left;   History reviewed. No pertinent family history. Social History  Substance Use Topics  . Smoking status: Former Smoker -- 1.00 packs/day for 43 years    Types: Cigarettes  . Smokeless tobacco: Never Used     Comment: "quit smoking cigarettes in the 1980s"  . Alcohol Use: Yes     Comment: "nothing  anymore" (02/03/2016)    Review of Systems  Constitutional: Positive for fatigue. Negative for fever, chills, activity change and appetite change.  HENT: Negative for congestion, facial swelling, rhinorrhea and sore throat.   Eyes: Negative for visual disturbance.  Respiratory: Negative for cough, shortness of breath and wheezing.   Cardiovascular: Negative for chest pain, palpitations and leg swelling.  Gastrointestinal: Positive for nausea and vomiting. Negative for abdominal pain and diarrhea.  Genitourinary: Negative for dysuria, frequency, hematuria, flank pain and difficulty urinating.  Musculoskeletal: Negative for myalgias, back pain, joint swelling, arthralgias, neck pain and neck stiffness.  Skin: Negative for rash.  Neurological: Negative for dizziness, weakness, light-headedness and headaches.  Psychiatric/Behavioral: Positive for confusion. Negative for behavioral problems and agitation.      Allergies  Review of patient's allergies indicates no known allergies.  Home Medications   Prior to Admission medications   Medication Sig Start Date End Date Taking? Authorizing Provider  alfuzosin (UROXATRAL) 10 MG 24 hr tablet Take 1 tablet (10 mg total) by mouth daily with breakfast. 3/28/Vincent   Hyacinth Meeker, MD  allopurinol (ZYLOPRIM) 100 MG tablet Take 1 tablet (100 mg total) by mouth 2 (two) times daily before a meal. 3/21/Vincent   Fuller Plan, MD  aspirin EC 81 MG tablet Take 81 mg by mouth every morning.    Historical Provider, MD  furosemide (  LASIX) 40 MG tablet Take 1 tablet (40 mg total) by mouth daily. 3/21/Vincent   Fuller Plan, MD  Multiple Vitamins-Minerals (ZINC PO) Take 1 tablet by mouth daily.    Historical Provider, MD   BP 101/47 mmHg  Pulse 55  Temp(Src) 97.6 F (36.4 C) (Oral)  Resp 9  Ht 5\' 11"  (1.803 m)  Wt 68.3 kg  BMI 21.01 kg/m2  SpO2 100% Physical Exam  Constitutional: He is oriented to person, place, and time. He appears well-developed and  well-nourished. No distress.  HENT:  Head: Normocephalic and atraumatic.  Right Ear: External ear normal.  Left Ear: External ear normal.  Nose: Nose normal.  Mouth/Throat: Oropharynx is clear and moist. No oropharyngeal exudate.  Eyes: Conjunctivae are normal. Pupils are equal, round, and reactive to light. Right eye exhibits no discharge. Left eye exhibits no discharge. No scleral icterus.  Neck: Normal range of motion. Neck supple. No tracheal deviation present.  Cardiovascular: Normal heart sounds.  Exam reveals no gallop and no friction rub.   No murmur heard. Tachycardic, irregularly irregular rhythm.  Pulmonary/Chest: Effort normal and breath sounds normal. No respiratory distress. He has no wheezes. He has no rales. He exhibits no tenderness.  Abdominal: Soft. Bowel sounds are normal. He exhibits no distension and no mass. There is no tenderness. There is no rebound and no guarding.  Musculoskeletal: Normal range of motion. He exhibits no edema or tenderness.  Neurological: He is alert and oriented to person, place, and time. He exhibits normal muscle tone.  Awakes to verbal stimuli, but falls asleep in between questioning. Answers questions appropriately.  Skin: Skin is warm and dry. No rash noted. He is not diaphoretic.  Psychiatric: He has a normal mood and affect. His behavior is normal. Judgment and thought content normal.    ED Course  Procedures (including critical care time) Labs Review Labs Reviewed  COMPREHENSIVE METABOLIC PANEL - Abnormal; Notable for the following:    Sodium 150 (*)    Potassium 3.0 (*)    Glucose, Bld 115 (*)    Creatinine, Ser 2.22 (*)    Albumin 1.9 (*)    AST 13 (*)    ALT 6 (*)    GFR calc non Af Amer 25 (*)    GFR calc Af Amer 29 (*)    All other components within normal limits  CBC WITH DIFFERENTIAL/PLATELET - Abnormal; Notable for the following:    RBC 3.03 (*)    Hemoglobin 8.2 (*)    HCT 26.5 (*)    RDW Vincent.9 (*)    Platelets 146  (*)    All other components within normal limits  URINALYSIS, ROUTINE W REFLEX MICROSCOPIC (NOT AT [facility]) - Abnormal; Notable for the following:    Color, Urine STRAW (*)    APPearance CLOUDY (*)    Specific Gravity, Urine 1.003 (*)    Hgb urine dipstick LARGE (*)    Leukocytes, UA TRACE (*)    All other components within normal limits  TROPONIN I - Abnormal; Notable for the following:    Troponin I 0.06 (*)    All other components within normal limits  TROPONIN I - Abnormal; Notable for the following:    Troponin I 0.07 (*)    All other components within normal limits  URINE MICROSCOPIC-ADD ON - Abnormal; Notable for the following:    Squamous Epithelial / LPF 0-5 (*)    Bacteria, UA RARE (*)    All other components within normal  limits  I-STAT CG4 LACTIC ACID, ED - Abnormal; Notable for the following:    Lactic Acid, Venous 2.51 (*)    All other components within normal limits  I-STAT ARTERIAL BLOOD GAS, ED - Abnormal; Notable for the following:    pCO2 arterial 46.9 (*)    Bicarbonate 27.9 (*)    All other components within normal limits  CULTURE, BLOOD (ROUTINE X 2)  CULTURE, BLOOD (ROUTINE X 2)  URINE CULTURE  MAGNESIUM  LACTIC ACID, PLASMA  CBC  BASIC METABOLIC PANEL  TROPONIN I  TSH  HEPARIN LEVEL (UNFRACTIONATED)  CBC  I-STAT CG4 LACTIC ACID, ED    Imaging Review Ct Head Wo Contrast  02/24/2016  CLINICAL DATA:  Altered mental status, dizziness, hypotension EXAM: CT HEAD WITHOUT CONTRAST TECHNIQUE: Contiguous axial images were obtained from the base of the skull through the vertex without intravenous contrast. COMPARISON:  03/26/2015 FINDINGS: Cavum septum pellucidum. Otherwise normal ventricular morphology. No midline shift or mass effect. Asymmetric positioning in gantry. Small vessel chronic ischemic changes of deep cerebral white matter. No intracranial hemorrhage, mass lesion, or evidence of acute infarction. No extra-axial fluid collections. Visualized paranasal  sinuses and mastoid air cells clear. Small nonspecific mixed lytic and sclerotic process at the RIGHT sphenoid image 18 is unchanged since 03/26/2015. New line scattered atherosclerotic calcifications at skullbase. IMPRESSION: Atrophy with small vessel chronic ischemic changes of deep cerebral white matter. No acute intracranial abnormalities. Electronically Signed   By: Ulyses SouthwardMark  Boles M.D.   On: 02/24/2016 Vincent:20   Dg Chest Port 1 View  02/24/2016  CLINICAL DATA:  Hypotension for 1 day, history stage III chronic kidney disease, hypertension, former smoker EXAM: PORTABLE CHEST 1 VIEW COMPARISON:  Portable exam 1521 hours compared to 02/20/2016 FINDINGS: RIGHT arm PICC line with tip projecting over proximal SVC. Upper normal heart size. Calcified tortuous aorta. Mediastinal contours and pulmonary vascularity normal. Emphysematous changes without infiltrate, pleural effusion or pneumothorax. Glenohumeral degenerative changes and probable chronic rotator cuff tears bilaterally. IMPRESSION: Suspected COPD changes. No acute abnormalities. Electronically Signed   By: Ulyses SouthwardMark  Boles M.D.   On: 02/24/2016 15:29   I have personally reviewed and evaluated these images and lab results as part of my medical decision-making.   EKG Interpretation   Date/Time:  Thursday February 24 2016 14:42:36 EDT Ventricular Rate:  164 PR Interval:  110 QRS Duration: 137 QT Interval:  327 QTC Calculation: 540 R Axis:   -102 Text Interpretation:  afib rvr Confirmed by RAY MD, Duwayne HeckANIELLE [phone]([id number]) on  02/24/2016 2:50:56 PM      MDM   Final diagnoses:  Atrial fibrillation with RVR (HCC)    On arrival the patient is somnolent but arouses to voice and answers questions appropriately. He is hypotensive with blood pressure of 70/40 and tachycardic with EKG revealing A. fib with RVR. This is a new diagnosis for this patient. He was loaded with diltiazem then started on a drip with achievement of rate control. He remained in atrial  fibrillation. Denied chest pain. Was requiring 3 L nasal cannula to maintain oxygenation in the mid 90s.  Patient was afebrile but given his hypotension, tachycardia, indwelling Foley, recent hospitalization, and chronic foot ulcers, he was simultaneously worked up for possible sepsis. Code sepsis initiated with blood and urine cultures drawn. First lactate elevated to 2.51. Patient was given Vanc and Zosyn for empiric treatment of possible infectious source. No leukocytosis. He has multiple electrolyte abnormalities, with hypernatremia to150, hypokalemia to 3.0, and creatinine elevated to 2.22.  Patient was given 30 mL/kg of fluid as he is thought to be dehydrated and is vomiting while in the ED. Troponin elevated to 0.06, though I suspect that this is in the setting of demand given his atrial fibrillation with RVR. Chest x-ray without focal consolidation to suggest pneumonia. Patient is admitted to the internal medicine teaching service for management of his atrial fibrillation and possible sepsis. CT head ordered for full evaluation of the pt's somnolence and is pending at the time of admission.    Khya Halls Ernestina Penna, MD 04/07/Vincent 1610  Margarita Grizzle, MD 05/04/Vincent [phone]

## 2016-02-24 NOTE — ED Notes (Signed)
Pt has indwelling foley catheter that was placed prior to arrival. Pt also has PICC line in right arm placed prior to arrival. Pt has bilateral wounds to feet.

## 2016-02-24 NOTE — Progress Notes (Signed)
Pharmacy Antibiotic Note  Vincent PigeonRobert T Abel is a [age] y.o. male admitted on 02/24/2016 with sepsis.  Recently admitted for sepsis 2/2 chronic foot ulcer.  PCP noted AMS and hypotension at f/u.  Appears fatigued and wife reports confusion.  Received home IV ertapenem until 4/4.  Pharmacy has been consulted for Vancomycin and Zosyn dosing.  On Admit: Afebrile, HR 109 (SVT), RR 22, WBC 6.7 Hypotensive (64/38), hypoxic (77), LA 2.51, Troponin 0.06  Plan: --Vancomycin 1750 mg x 1, then 750 mg q24h --Zosyn 3.375g q8h (extended infusion) --Obtain Vanc Trough at steady state (Goal 15-20) --Follow renal function, clinical course cultures   Height: 5\' 11"  (180.3 cm) Weight: 156 lb (70.761 kg) IBW/kg (Calculated) : 75.3  Temp (24hrs), Avg:97.9 F (36.6 C), Min:97.8 F (36.6 C), Max:97.9 F (36.6 C)   Recent Labs Lab 02/20/16 1340 02/20/16 1349 02/20/16 1642 02/20/16 1900 02/21/16 0503 02/24/16 1450 02/24/16 1502  WBC 6.0  --   --   --   --  6.7  --   CREATININE 1.94*  --   --  1.82* 1.82*  --   --   LATICACIDVEN  --  1.59 1.84  --   --   --  2.51*    Estimated Creatinine Clearance: 27.6 mL/min (by C-G formula based on Cr of 1.82).    No Known Allergies  Antimicrobials this admission: 4/6 Zosyn >>  4/6 Vanc >>   Dose adjustments this admission:   Microbiology results: 4/6 BCx:  4/6 UCx:    Thank you for allowing pharmacy to be a part of this patient's care.  Kathlynn Gratedam Marijose Curington 02/24/2016 3:40 PM

## 2016-02-24 NOTE — Telephone Encounter (Signed)
Vincent HuaDavid states pt and spouse refuse all homecare assist

## 2016-02-24 NOTE — H&P (Signed)
Date: 02/24/2016               Patient Name:  Vincent Miranda MRN: [id number]  DOB: 1925-10-11 Age / Sex: 82 y.o., male   PCP: No Pcp Per Patient         Medical Service: Internal Medicine Teaching Service         Attending Physician: Dr. Burns SpainElizabeth A Butcher, MD    First Contact: Dr. Ladona Ridgelaylor Pager: [phone]  Second Contact: Dr. Beckie Saltsivet Pager: [phone]       After Hours (After 5p/  First Contact Pager: [phone]  weekends / holidays): Second Contact Pager: [phone]   Chief Complaint: hypotension, syncope  History of Present Illness: Vincent Miranda is an 82 yo male with CKD3, AoCD, gout, PAD, and chronic ischemic ulcers of lower extremities. He was recently admitted on 4/2 due to hypotension and presycnope.  During that admission, he was given IVF and electrolyte supplementation and discharged on 4/3.  Since discharge, the patient had no complaints until this morning, when he complained of recurrent dizziness and nausea.  He reports 2-3 episodes of nonbloody emesis.  BP check at home showed sBP in the 90s.  He presented to his clinic f/u appointment, where he was noted to be slightly confused, with BPs 60-80/30-60 and intermittently apneic.  Per his wife, he would desaturate into the 60s until prompted to breathe, at which point he would return to 100% O2 sat.  He denies chest pain, SOB, palpitations, irregular heart rhythm, focal weakness, trouble speaking, or LOC a/w the episode.  He and wife report good PO intake with improved taste after completing antibiotics.   He recently completed 2 weeks of Ertapenem for his chronic lower extremity ischemic ulcers.  However, would cultures drawn during last admission grew MRSA and Pseudomonas resistant to Imipenem.    In the ED, he was found to have BP 70/40 and afib with RVR at rates 100-170.  Lactate was elevated to 2.5.  Sodium was elevated to 150 and potassium low at 3.0.  He was started on Diltiazem drip in the ED.  Meds: Current  Facility-Administered Medications  Medication Dose Route Frequency Provider Last Rate Last Dose  . diltiazem (CARDIZEM) 100 mg in dextrose 5 % 100 mL (1 mg/mL) infusion  5-15 mg/hr Intravenous Continuous Jenifer Ernestina PennaBrunno Irick, MD 5 mL/hr at 02/24/16 1535 5 mg/hr at 02/24/16 1535  . potassium chloride 10 mEq in 100 mL IVPB  10 mEq Intravenous Q1 Hr x 2 Jenifer Ernestina PennaBrunno Irick, MD 100 mL/hr at 02/24/16 1635 10 mEq at 02/24/16 1635  . vancomycin (VANCOCIN) 1,750 mg in sodium chloride 0.9 % 500 mL IVPB  1,750 mg Intravenous Once Sampson SiBenjamin G Mancheril, RPH 250 mL/hr at 02/24/16 1643 1,750 mg at 02/24/16 1643   Current Outpatient Prescriptions  Medication Sig Dispense Refill  . alfuzosin (UROXATRAL) 10 MG 24 hr tablet Take 1 tablet (10 mg total) by mouth daily with breakfast. 30 tablet 1  . allopurinol (ZYLOPRIM) 100 MG tablet Take 1 tablet (100 mg total) by mouth 2 (two) times daily before a meal. 60 tablet 0  . aspirin EC 81 MG tablet Take 81 mg by mouth every morning.    . furosemide (LASIX) 40 MG tablet Take 1 tablet (40 mg total) by mouth daily. 30 tablet 0  . Multiple Vitamins-Minerals (ZINC PO) Take 1 tablet by mouth daily.      Allergies: Allergies as of 02/24/2016  . (No Known Allergies)   Past Medical History  Diagnosis Date  . Hypertension   . Chronic kidney disease   . Anemia of chronic disease   . Gout   . Acute blood loss anemia 10/18/2013  . History of blood transfusion    Past Surgical History  Procedure Laterality Date  . Eye surgery Right     "had accident; had to replace lens in eye"  . Amputation Left 10/19/2013    Procedure: AMPUTATION RAY  LEFT GREAT TOE;  Surgeon: Nadara Mustard, MD;  Location: [facility] OR;  Service: Orthopedics;  Laterality: Left;   History reviewed. No pertinent family history. Social History   Social History  . Marital Status: Married    Spouse Name: N/A  . Number of Children: N/A  . Years of Education: N/A   Occupational History  . Not on file.     Social History Main Topics  . Smoking status: Former Smoker -- 1.00 packs/day for 43 years    Types: Cigarettes  . Smokeless tobacco: Never Used     Comment: "quit smoking cigarettes in the 1980s"  . Alcohol Use: Yes     Comment: "nothing anymore" (02/03/2016)  . Drug Use: No  . Sexual Activity: Not Currently   Other Topics Concern  . Not on file   Social History Narrative    Review of Systems: Pertinent items noted in HPI and remainder of comprehensive ROS otherwise negative.  Physical Exam: Blood pressure 125/69, pulse 74, temperature 97.8 F (36.6 C), temperature source Oral, resp. rate 22, height  (1.803 m), weight 156 lb (70.761 kg), SpO2 77 %. Physical Exam  Constitutional: He is oriented to person, place, and time.  Elderly, chronically ill-appearing male, sitting in bed, NAD  HENT:  Head: Normocephalic and atraumatic.  Eyes: EOM are normal. No scleral icterus.  Neck: No tracheal deviation present.  Cardiovascular: Normal rate and normal heart sounds.   Irregularly irregular rhythm.  Pulmonary/Chest: No stridor.  Poor inspiratory effort. Minimal crackles in bilateral bases.  No wheezes.  Abdominal: Soft. He exhibits no distension. There is no tenderness. There is no rebound and no guarding.  Musculoskeletal: He exhibits no edema.  Neurological: He is alert and oriented to person, place, and time.  Skin: Skin is warm and dry.  Multiple ulcers on bilateral feet with pink granulation tissue without surrounding erythema or drainage.     Lab results: Basic Metabolic Panel:  Recent Labs  16/10/96 1450  NA 150*  K 3.0*  CL 110  CO2 29  GLUCOSE 115*  BUN 19  CREATININE 2.22*  CALCIUM 9.3   Liver Function Tests:  Recent Labs  02/24/16 1450  AST 13*  ALT 6*  ALKPHOS 72  BILITOT 0.5  PROT 7.4  ALBUMIN 1.9*   No results for input(s): LIPASE, AMYLASE in the last 72 hours. No results for input(s): AMMONIA in the last 72 hours. CBC:  Recent  Labs  02/24/16 1450  WBC 6.7  NEUTROABS 4.9  HGB 8.2*  HCT 26.5*  MCV 87.5  PLT 146*   Cardiac Enzymes:  Recent Labs  02/24/16 1450  TROPONINI 0.06*   BNP: No results for input(s): PROBNP in the last 72 hours. D-Dimer: No results for input(s): DDIMER in the last 72 hours. CBG: No results for input(s): GLUCAP in the last 72 hours. Hemoglobin A1C: No results for input(s): HGBA1C in the last 72 hours. Fasting Lipid Panel: No results for input(s): CHOL, HDL, LDLCALC, TRIG, CHOLHDL, LDLDIRECT in the last 72 hours. Thyroid Function Tests: No results  for input(s): TSH, T4TOTAL, FREET4, T3FREE, THYROIDAB in the last 72 hours. Anemia Panel: No results for input(s): VITAMINB12, FOLATE, FERRITIN, TIBC, IRON, RETICCTPCT in the last 72 hours. Coagulation: No results for input(s): LABPROT, INR in the last 72 hours. Urine Drug Screen: Drugs of Abuse  No results found for: LABOPIA, COCAINSCRNUR, LABBENZ, AMPHETMU, THCU, LABBARB  Alcohol Level: No results for input(s): ETH in the last 72 hours. Urinalysis: No results for input(s): COLORURINE, LABSPEC, PHURINE, GLUCOSEU, HGBUR, BILIRUBINUR, KETONESUR, PROTEINUR, UROBILINOGEN, NITRITE, LEUKOCYTESUR in the last 72 hours.  Invalid input(s): APPERANCEUR Misc. Labs:   Imaging results:  Dg Chest Port 1 View  02/24/2016  CLINICAL DATA:  Hypotension for 1 day, history stage III chronic kidney disease, hypertension, former smoker EXAM: PORTABLE CHEST 1 VIEW COMPARISON:  Portable exam 1521 hours compared to 02/20/2016 FINDINGS: RIGHT arm PICC line with tip projecting over proximal SVC. Upper normal heart size. Calcified tortuous aorta. Mediastinal contours and pulmonary vascularity normal. Emphysematous changes without infiltrate, pleural effusion or pneumothorax. Glenohumeral degenerative changes and probable chronic rotator cuff tears bilaterally. IMPRESSION: Suspected COPD changes. No acute abnormalities. Electronically Signed   By: Ulyses Southward  M.D.   On: 02/24/2016 15:29    Other results: EKG: atrial fibrillation, rate 89, RBBB.  Assessment & Plan by Problem: Active Problems:   Hypotension  Mr. Seith is an 82 yo male with CKD3, AoCD, gout, and PAD, recently discharged from [facility] for treatment of multiple lower extremity ulcers, presenting with hypotension.  Hypotension and AFib with RVR: Patient again hypotensive with associated nausea and found to be in afib with RVR with slightly elevated lactate.  Unclear if afib precedes his hypotensive episodes or is a sequela of them, but paroxysms would certainly explain his intermittent presentations.  Patient's alertness and orientation also improved with IVF and rate control, further supporting afib as the cause.  The cause of his afib is also unclear, however, possibly 2/2 ongoing LE infection or his dehydration/hypovolemia.  Hypernatremia to 150 may also cause weakness and nausea, but it is not acute and does not explain intermittent symptomatolgy.  Given his extensive lower extremity wounds, sepsis should remain a concern, but wounds are stable, patient afebrile, and no leukocytosis c/f systemic inflammatory response. No chest pain, SOB, dysarthria, weakness, or LOC c/f MI, PE, or CVA.  Troponin negative.  If patient rate controlled overnight, will switch to PO beta blocker.  Rhythm control would be less beneficial given the paroxysmal disease.  CHADS-VASc at least 3 and previous echo pending.   - Telemetry - Dilt drip - Vanc and Cefepime - Heparin gtt for anticoagulation. Consider switch to DOAC after Cr improves.  Trend lactate  Trend troponins  Orthostatics  TTE  TSH  AKI on CKD3: Cr elevated to 2.22 (baseline ~1.8-1.9).  Given 3L in ED.  Given his hypotension, most likely etiology is pre-renal.  Will manage conservatively by checking Cr on repeat BMP in AM.    BMP in AM  Hypokalemia and Hypernatremia: Na 150 and K 3.0 in the ED. No changes c/f hypokalemia on  ECG. Patient given 2 runs of KCl IV in ED.  Both possibly explained by GI losses 2/2 vomiting.  Also possible he is having insensible losses from drainage of leg wounds.    BMP in AM  LE Ulcers, stable: Patient completed 2 weeks of Ertapenem IV, but last cultures grew MRSA and Pseudomonas resistant to Imipenem.  Will resume coverage during admission, though sterilization  of wounds is likely impossible without amputation.   - Vanc/Cefepime  AOCD: Hgb 8.2 and stable compared to baseline. - CTM  FEN/GI: - Regular  DVT Ppx: heparin  Dispo: Disposition is deferred at this time, awaiting improvement of current medical problems. Anticipated discharge in approximately 1-3 day(s).   The patient does have a current PCP (No Pcp Per Patient) and does need an [facility] hospital follow-up appointment after discharge.  The patient does not have transportation limitations that hinder transportation to clinic appointments.  Signed: Jana Half, MD, PhD 02/24/2016, 4:53 PM

## 2016-02-24 NOTE — Progress Notes (Signed)
Patient was transported via wheelchair to ER.  Accompanied by wife.  On 2 liters of oxygen per nasal cannula.  Angelina OkGladys Raney Antwine, RN 02/24/2016 2:30 PM

## 2016-02-24 NOTE — ED Notes (Signed)
Attempted to call report x 1  

## 2016-02-24 NOTE — Progress Notes (Signed)
ANTICOAGULATION CONSULT NOTE - Initial Consult  Pharmacy Consult for Heparin  Indication: atrial fibrillation  No Known Allergies  Patient Measurements: Height: 5\' 11"  (180.3 cm) Weight: 156 lb (70.761 kg) IBW/kg (Calculated) : 75.3 Heparin Dosing Weight: 70 kg   Vital Signs: Temp: 97.8 F (36.6 C) (04/06 1443) Temp Source: Oral (04/06 1443) BP: 109/61 mmHg (04/06 1800) Pulse Rate: 74 (04/06 1800)  Labs:  Recent Labs  02/24/16 1450  HGB 8.2*  HCT 26.5*  PLT 146*  CREATININE 2.22*  TROPONINI 0.06*    Estimated Creatinine Clearance: 22.6 mL/min (by C-G formula based on Cr of 2.22).   Medical History: Past Medical History  Diagnosis Date  . Hypertension   . Chronic kidney disease   . Anemia of chronic disease   . Gout   . Acute blood loss anemia 10/18/2013  . History of blood transfusion     Medications:  Prescriptions prior to admission  Medication Sig Dispense Refill Last Dose  . alfuzosin (UROXATRAL) 10 MG 24 hr tablet Take 1 tablet (10 mg total) by mouth daily with breakfast. 30 tablet 1 02/20/2016 at Unknown time  . allopurinol (ZYLOPRIM) 100 MG tablet Take 1 tablet (100 mg total) by mouth 2 (two) times daily before a meal. 60 tablet 0 02/20/2016 at Unknown time  . aspirin EC 81 MG tablet Take 81 mg by mouth every morning.   02/20/2016 at Unknown time  . furosemide (LASIX) 40 MG tablet Take 1 tablet (40 mg total) by mouth daily. 30 tablet 0 02/20/2016 at Unknown time  . Multiple Vitamins-Minerals (ZINC PO) Take 1 tablet by mouth daily.   02/20/2016 at Unknown time    Assessment: [age] YOM with new Afib to start IV heparin. Currently rate controlled on IV diltiazem. H/H low, Plt 146K. Patient was not on any anticoagulation prior to admission   Goal of Therapy:  Heparin level 0.3-0.7 units/ml Monitor platelets by anticoagulation protocol: Yes   Plan:  -Give IV heparin 4000 units bolus, then heparin infusion at 1050 units/hr -F/u 8 hr HL -Monitor daily HL, CBC and  s/s of bleeding  Vinnie LevelBenjamin Ma Munoz, PharmD., BCPS Clinical Pharmacist Pager [phone]

## 2016-02-24 NOTE — Progress Notes (Signed)
Pharmacy Antibiotic Note  Vincent Miranda is a 82 y.o. male admitted on 02/24/2016 with sepsis.  Recently admitted for sepsis d/t chronic foot ulcer.  PCP noted AMS and hypotension at f/u. Received home IV ertapenem until 4/4.  Pharmacy has been consulted for Vancomycin and Cefepime dosing.   Plan: --Vancomycin 1750 mg x 1, then 750 mg q24h --Cefepime 1g IV q24h starting at midnight (~8h after Zosyn dose) --Obtain Vanc Trough at steady state (Goal 15-20) --Follow renal function, clinical course cultures   Height: 5\' 11"  (180.3 cm) Weight: 156 lb (70.761 kg) IBW/kg (Calculated) : 75.3  Temp (24hrs), Avg:97.9 F (36.6 C), Min:97.8 F (36.6 C), Max:97.9 F (36.6 C)   Recent Labs Lab 02/20/16 1340 02/20/16 1349 02/20/16 1642 02/20/16 1900 02/21/16 0503 02/24/16 1450 02/24/16 1502  WBC 6.0  --   --   --   --  6.7  --   CREATININE 1.94*  --   --  1.82* 1.82* 2.22*  --   LATICACIDVEN  --  1.59 1.84  --   --   --  2.51*    Estimated Creatinine Clearance: 22.6 mL/min (by C-G formula based on Cr of 2.22).    No Known Allergies  Antimicrobials this admission: Zosyn 4/6 x1 >>  Vanc 4/6>>  Cefepime 4/6>>  Dose adjustments this admission: N/A  Microbiology results: 4/6 BCx: sent 4/6 UCx: sent 4/2 wound cx: MRSA and Pseudomonas (R to imipenem)   Thank you for allowing pharmacy to be a part of this patient's care.  Laneice Meneely D. Margaretha Mahan, PharmD, BCPS Clinical Pharmacist Pager: [phone] 02/24/2016 7:42 PM

## 2016-02-24 NOTE — Telephone Encounter (Signed)
[facility]

## 2016-02-25 ENCOUNTER — Inpatient Hospital Stay (HOSPITAL_COMMUNITY): Payer: Medicare Other

## 2016-02-25 DIAGNOSIS — I480 Paroxysmal atrial fibrillation: Principal | ICD-10-CM

## 2016-02-25 DIAGNOSIS — I4891 Unspecified atrial fibrillation: Secondary | ICD-10-CM

## 2016-02-25 LAB — CULTURE, BLOOD (ROUTINE X 2)
CULTURE: NO GROWTH
Culture: NO GROWTH

## 2016-02-25 LAB — CBC
HCT: 19.6 % — ABNORMAL LOW (ref 39.0–52.0)
HEMATOCRIT: 27 % — AB (ref 39.0–52.0)
HEMOGLOBIN: 6 g/dL — AB (ref 13.0–17.0)
HEMOGLOBIN: 8.3 g/dL — AB (ref 13.0–17.0)
MCH: 26.7 pg (ref 26.0–34.0)
MCH: 26.8 pg (ref 26.0–34.0)
MCHC: 30.6 g/dL (ref 30.0–36.0)
MCHC: 31.1 g/dL (ref 30.0–36.0)
MCV: 87.1 fL (ref 78.0–100.0)
MCV: 86 fL (ref 78.0–100.0)
Platelets: 123 10*3/uL — ABNORMAL LOW (ref 150–400)
Platelets: 119 10*3/uL — ABNORMAL LOW (ref 150–400)
RBC: 2.25 MIL/uL — ABNORMAL LOW (ref 4.22–5.81)
RBC: 3.14 MIL/uL — ABNORMAL LOW (ref 4.22–5.81)
RDW: 17.9 % — ABNORMAL HIGH (ref 11.5–15.5)
RDW: 17.2 % — ABNORMAL HIGH (ref 11.5–15.5)
WBC: 5.1 10*3/uL (ref 4.0–10.5)
WBC: 6.3 10*3/uL (ref 4.0–10.5)

## 2016-02-25 LAB — URINE MICROSCOPIC-ADD ON

## 2016-02-25 LAB — ECHOCARDIOGRAM COMPLETE
Height: 71 in
Weight: 2409.19 oz

## 2016-02-25 LAB — BASIC METABOLIC PANEL
Anion gap: 7 (ref 5–15)
BUN: 17 mg/dL (ref 6–20)
CO2: 27 mmol/L (ref 22–32)
CREATININE: 1.82 mg/dL — AB (ref 0.61–1.24)
Calcium: 8 mg/dL — ABNORMAL LOW (ref 8.9–10.3)
Chloride: 114 mmol/L — ABNORMAL HIGH (ref 101–111)
GFR calc non Af Amer: 31 mL/min — ABNORMAL LOW (ref >60)
GFR, EST AFRICAN AMERICAN: 36 mL/min — AB (ref >60)
Glucose, Bld: 84 mg/dL (ref 65–99)
Potassium: 3.2 mmol/L — ABNORMAL LOW (ref 3.5–5.1)
Sodium: 148 mmol/L — ABNORMAL HIGH (ref 135–145)

## 2016-02-25 LAB — TROPONIN I: Troponin I: 0.07 ng/mL — ABNORMAL HIGH (ref <0.031)

## 2016-02-25 LAB — TSH: TSH: 1.295 u[IU]/mL (ref 0.350–4.500)

## 2016-02-25 LAB — URINALYSIS, ROUTINE W REFLEX MICROSCOPIC
Bilirubin Urine: NEGATIVE
GLUCOSE, UA: NEGATIVE mg/dL
Ketones, ur: NEGATIVE mg/dL
Nitrite: NEGATIVE
PROTEIN: NEGATIVE mg/dL
Specific Gravity, Urine: 1.003 — ABNORMAL LOW (ref 1.005–1.030)
pH: 5.5 (ref 5.0–8.0)

## 2016-02-25 LAB — PREPARE RBC (CROSSMATCH)

## 2016-02-25 LAB — HEPARIN LEVEL (UNFRACTIONATED): Heparin Unfractionated: 0.26 IU/mL — ABNORMAL LOW (ref 0.30–0.70)

## 2016-02-25 MED ORDER — PANTOPRAZOLE SODIUM 40 MG PO TBEC
40.0000 mg | DELAYED_RELEASE_TABLET | Freq: Every day | ORAL | Status: DC
  Administered 2016-02-25 – 2016-02-28 (×4): 40 mg via ORAL
  Filled 2016-02-25 (×4): qty 1

## 2016-02-25 MED ORDER — METOPROLOL TARTRATE 12.5 MG HALF TABLET
12.5000 mg | ORAL_TABLET | Freq: Two times a day (BID) | ORAL | Status: DC
  Administered 2016-02-25 – 2016-02-28 (×7): 12.5 mg via ORAL
  Filled 2016-02-25 (×7): qty 1

## 2016-02-25 MED ORDER — SODIUM CHLORIDE 0.9 % IV SOLN
Freq: Once | INTRAVENOUS | Status: AC
  Administered 2016-02-25: 09:00:00 via INTRAVENOUS

## 2016-02-25 MED ORDER — POTASSIUM CHLORIDE CRYS ER 20 MEQ PO TBCR
40.0000 meq | EXTENDED_RELEASE_TABLET | Freq: Once | ORAL | Status: AC
  Administered 2016-02-25: 40 meq via ORAL
  Filled 2016-02-25: qty 2

## 2016-02-25 MED ORDER — POTASSIUM CHLORIDE CRYS ER 20 MEQ PO TBCR
40.0000 meq | EXTENDED_RELEASE_TABLET | Freq: Every day | ORAL | Status: DC
  Administered 2016-02-25 – 2016-02-28 (×4): 40 meq via ORAL
  Filled 2016-02-25 (×4): qty 2

## 2016-02-25 MED ORDER — SODIUM CHLORIDE 0.9 % IV SOLN: Freq: Once | INTRAVENOUS | Status: DC

## 2016-02-25 NOTE — Progress Notes (Signed)
Internal Medicine Clinic Attending  I saw and evaluated the patient.  I personally confirmed the key portions of the history and exam documented by Dr. Dimple Caseyice and I reviewed pertinent patient test results.  The assessment, diagnosis, and plan were formulated together and I agree with the documentation in the resident's note.  Patient very ill in clinic, appropriately transferred to ED for higher level of care.

## 2016-02-25 NOTE — Progress Notes (Signed)
When RN began obtaining VS in preparation for blood transfusion and discussing blood transfusion with the patient, the patient advised he was unsure he wanted blood transfusion.  Patient previously signed consent with prior RN, however he is now unsure if blood transfusion is necessary and states he has questions.  RN discussed with patient why blood transfusion was ordered by MD. Patient still feels uncertain and stated he would like to discuss with MD before consenting to transfusion.  Paged MD Ladona Ridgelaylor to inform.  Patient on cardizem drip and heart rate in upper 50's. MD Ladona Ridgelaylor advised to wean cardizem drip and to turn off if able.

## 2016-02-25 NOTE — Clinical Documentation Improvement (Signed)
Internal Medicine  Can the diagnosis of Atrial Fibrillation be further specified?   Chronic Atrial fibrillation  Paroxysmal Atrial fibrillation  Permanent Atrial fibrillation  Persistent Atrial fibrillation  Other  Clinically Undetermined  Document any associated diagnoses/conditions. Please update your documentation within the medical record to reflect your response to this query. Thank you.  Supporting Information: "EKG revealing A. fib with RVR."  Please exercise your independent, professional judgment when responding. A specific answer is not anticipated or expected.  Thank You, Nevin BloodgoodJoan B Masiah Lewing, RN, BSN, CCDS,Clinical Documentation Specialist:  [phone]  [phone]=[employer] [place]- Health Information Management

## 2016-02-25 NOTE — Consult Note (Addendum)
WOC wound consult note Reason for Consult: Consult requested for bilat feet.  Pt has been followed in the past by ortho service during the previous admission who recommended an amputation for wet gangrene at that time.  Pt and wife refused surgical options and wounds have improved significantly since previous ortho consult note last month. Multiple full thickness wounds: Left anterior foot 8X7X.2cm, 85% red, 15% yellow slough, mod amt tan drainage, no odor Left inner foot 9X6X.2cm, 90% red, 10% yellow, mod amt tan drainage, no odor Left anterior foot near previous toe amputation site .5X.5X.1cm, pink and moist Left inner ankle .5X.5X.2cm, pink and moist Right inner foot 8X7X.2cm, 90% red, 10% yellow, mod amt tan drainage, no odor Right outer foot 3X4X.2cm, 10% yellow, mod amt tan drainage, no odor Dressing procedure/placement/frequency: Pt has home health assistance prior to admission and wife is very well-informed regarding topical treatment.  She states home health planned to begin silver hydrofiber on Monday; agree with this plan of care and orders provided for the staff nurses to perform daily dressing changes.  Pt and wife deny further questions. Please re-consult if further assistance is needed.  Thank-you,  Cammie Mcgeeawn Nareg Breighner MSN, RN, CWOCN, [place], CNS [phone]

## 2016-02-25 NOTE — Progress Notes (Signed)
  Date: 02/25/2016  Patient name: Vincent PigeonRobert T Heyboer  Medical record number: [id number]  Date of birth: May 25, 1925   I have seen and evaluated Vincent Pigeonobert T Krempasky and discussed their care with the Residency Team. [name] is well known to our service. He has an ischemic necrotic ulcers B LE. In May 2016, Dr Lajoyce Cornersuda had rec B mid tibial amputations and the pt refused. In March 2017, Dr Lajoyce Cornersuda rec a L BKA and again the pt refused. He has been tx with BX and wound are. Wound cx grew pseudomonas resistant to impenum (pt had bee non ertapenem). The wounds are stable.  His 4/2 admission was 2/2 hypotension in the setting of dizziness, diaphoretic, and nausea. His EKG was sinus that admit. On the day prior to admit, the 5th, he had a similar slight spell that went away. On the day of admit, the 6th, he had the worst spell, again similar to the prior. This EKG captured A Fib. He was start on a heparin gtt. HgB dropped to 6 from 8.2. He was started on a cardiazem gtt and BP increased and HR dropped. Currently, feels well.   PMHx, Fam Hx, and/or Soc Hx : Lives with wife. Has home heath for wound care  Filed Vitals:   02/25/16 1300 02/25/16 1315  BP: 111/55 113/66  Pulse: 53 53  Temp: 97.5 F (36.4 C) 98.5 F (36.9 C)  Resp: 19 20   Gen : elderly male, NAD H irr irr L CTAB laterally  I indep viewed the EKG and verified my interpretation with the official interpretation : A dib / flutter.  Assessment and Plan: I have seen and evaluated the patient as outlined above. I agree with the formulated Assessment and Plan as detailed in the residents' note, with the following changes:   1. A Fib - likely has had paroxymal A Fib which had resolved by the time he presented to hospital. He is now off the cardizem gtt and is on PO metoprolol. He remains rate controlled. He will need to cont this as an outpt. Anticoag will be tricky - for now, check guiac and repeat HgB. Will need to balance risk of bleeding with  risk of CVA. ECHO pending    Burns SpainElizabeth A Butcher, MD 4/7/20172:26 PM

## 2016-02-25 NOTE — Progress Notes (Signed)
[facility]  Patient Status: Active (receiving services up to time of hospitalization)  [facility] is providing the following services: RN, PT and OT  If patient discharges after hours, please call [phone].   Vincent FurnishDonna Miranda 02/25/2016, 11:03 AM

## 2016-02-25 NOTE — Progress Notes (Signed)
RN found patient's foley dislodged when RN went to perform a bladder scan.  Foley had been placed by outpatient urologist and per patient's wife was to stay in place until follow up appointment on May 2nd due to patient has been experiencing retention issues.  Bladder scan volume 596.  Paged MD, MD advised it is ok to replace with foley on unit.   RN performed peri care and placed foley under sterile technique with second RN.  Peri and foley care performed after insertion.

## 2016-02-25 NOTE — Discharge Summary (Signed)
Name: Vincent Miranda MRN: [id number] DOB: August 29, 1925 82 y.o. PCP: No Pcp Per Patient  Date of Admission: 02/24/2016  2:30 PM Date of Discharge: 02/28/2016 Attending Physician: Burns Spain, MD  Discharge Diagnosis: 1. Atrial Fibrillation with RVR 2. Anemia   Active Problems:   Hypotension   Sepsis due to undetermined organism [facility])  Discharge Medications:   Medication List    TAKE these medications        alfuzosin 10 MG 24 hr tablet  Commonly known as:  UROXATRAL  Take 1 tablet (10 mg total) by mouth daily with breakfast.     allopurinol 100 MG tablet  Commonly known as:  ZYLOPRIM  Take 1 tablet (100 mg total) by mouth 2 (two) times daily before a meal.     aspirin EC 81 MG tablet  Take 81 mg by mouth daily.     furosemide 40 MG tablet  Commonly known as:  LASIX  Take 1 tablet (40 mg total) by mouth 2 (two) times daily.     metoprolol tartrate 25 MG tablet  Commonly known as:  LOPRESSOR  Take 0.5 tablets (12.5 mg total) by mouth 2 (two) times daily.     pantoprazole 40 MG tablet  Commonly known as:  PROTONIX  Take 1 tablet (40 mg total) by mouth daily.     potassium chloride SA 20 MEQ tablet  Commonly known as:  K-DUR,KLOR-CON  Take 2 tablets (40 mEq total) by mouth daily.     warfarin 5 MG tablet  Commonly known as:  COUMADIN  Take 1 tablet (5 mg total) by mouth daily.     Zinc 50 MG Tabs  Take 50 mg by mouth daily.        Disposition and follow-up:   Mr.Tolbert T Selkirk was discharged from [facility] in Stable condition.  At the hospital follow up visit please address:  1.  INR and Warfarin dosing, Potassium and supplementation requirements, volume status/HTN and Lasix dosing  2.  Labs / imaging needed at time of follow-up: BMP  3.  Pending labs/ test needing follow-up: none  Follow-up Appointments: Follow-up Information    Follow up with Boykin Peek, MD On 03/01/2016.   Specialty:  Internal Medicine   Why:   3:15p   Contact information:   [address] [place] [address] [phone]       Discharge Instructions: Discharge Instructions    Call MD for:  persistant dizziness or light-headedness    Complete by:  As directed      Call MD for:  redness, tenderness, or signs of infection (pain, swelling, redness, odor or green/yellow discharge around incision site)    Complete by:  As directed      Call MD for:  temperature >100.4    Complete by:  As directed      Diet - low sodium heart healthy    Complete by:  As directed      Increase activity slowly    Complete by:  As directed            Consultations:    Procedures Performed:  Dg Tibia/fibula Left  02/02/2016  CLINICAL DATA:  Distal lower extremity ulceration EXAM: LEFT TIBIA AND FIBULA - 2 VIEW COMPARISON:  None. FINDINGS: Frontal and lateral views were obtained. There is a large soft tissue ulceration located medial and anterior to the distal tibia and fibula measuring 4.8 x 1.9 cm. A small amount of soft tissue air  is seen slightly superior to this large ulceration. There is no erosive change or bony destruction. No fracture or dislocation evident. There is osteoarthritic change in the knee and ankle joint regions. There is marked narrowing in the lateral compartment of the knee joint. There is no abnormal periosteal reaction. IMPRESSION: Large distal soft tissue ulceration anterior and medial in location. A small amount of soft tissue air is seen just superior to this large ulceration which may indicate spread of infection into the soft tissues slightly more superiorly. No bony lesion. Specifically, no osteomyelitis is evident. No fracture or dislocation. There is arthropathy in the knee and shoulder regions. Electronically Signed   By: Bretta Bang III M.D.   On: 02/02/2016 12:50   Ct Head Wo Contrast  02/24/2016  CLINICAL DATA:  Altered mental status, dizziness, hypotension EXAM: CT HEAD WITHOUT CONTRAST TECHNIQUE: Contiguous  axial images were obtained from the base of the skull through the vertex without intravenous contrast. COMPARISON:  03/26/2015 FINDINGS: Cavum septum pellucidum. Otherwise normal ventricular morphology. No midline shift or mass effect. Asymmetric positioning in gantry. Small vessel chronic ischemic changes of deep cerebral white matter. No intracranial hemorrhage, mass lesion, or evidence of acute infarction. No extra-axial fluid collections. Visualized paranasal sinuses and mastoid air cells clear. Small nonspecific mixed lytic and sclerotic process at the RIGHT sphenoid image 18 is unchanged since 03/26/2015. New line scattered atherosclerotic calcifications at skullbase. IMPRESSION: Atrophy with small vessel chronic ischemic changes of deep cerebral white matter. No acute intracranial abnormalities. Electronically Signed   By: Ulyses Southward M.D.   On: 02/24/2016 17:20   Mr Tibia Fibula Left Wo Contrast  02/03/2016  CLINICAL DATA:  Chronic ulcers along the lower legs. Toe amputations. New wound along the left shin. Increased swelling. EXAM: MRI OF LOWER LEFT EXTREMITY WITHOUT CONTRAST TECHNIQUE: Multiplanar, multisequence MR imaging of the left lower leg was performed. No intravenous contrast was administered. COMPARISON:  02/02/2016 FINDINGS: Asymmetric cutaneous ulceration anteriorly along the lower leg just above the level of the ankle, skin defect approximately 5.3 by 5.0 cm, with a small amount of gas tracking in the soft tissues adjacent to the ulceration and with confluent edema tracking in the adjacent subcutaneous tissues. A lesser amount of subcutaneous edema tracks throughout the calf and there is low-level edema tracking in the calf musculature bilaterally. The tibialis anterior tendon appears discontinuous along the ulceration, images 15-18 series 4. No underlying osteomyelitis or non draining ulcer in this area. There is a suggestion of cutaneous ulceration overlying both right and left medial  malleoli. IMPRESSION: 1. Large cutaneous ulceration of the anterior lower leg just above the level of the ankle joint. There is discontinuity of the tibialis anterior tendon in the vicinity of this deep ulceration, as well as confluent subcutaneous edema likely reflecting local surrounding cellulitis. 2. Generalized subcutaneous edema in the calves, left greater than right, and also in the regional muscles. Although possibly from noninfectious causes, the possibility of myositis and cellulitis is difficult to exclude. 3. No non draining abscess is seen. No regional osteomyelitis identified. 4. There is are likely ulcerations overlying both the right and left medial malleoli. Electronically Signed   By: Gaylyn Rong M.D.   On: 02/03/2016 07:48   Dg Chest Port 1 View  02/24/2016  CLINICAL DATA:  Hypotension for 1 day, history stage III chronic kidney disease, hypertension, former smoker EXAM: PORTABLE CHEST 1 VIEW COMPARISON:  Portable exam 1521 hours compared to 02/20/2016 FINDINGS: RIGHT arm PICC line with  tip projecting over proximal SVC. Upper normal heart size. Calcified tortuous aorta. Mediastinal contours and pulmonary vascularity normal. Emphysematous changes without infiltrate, pleural effusion or pneumothorax. Glenohumeral degenerative changes and probable chronic rotator cuff tears bilaterally. IMPRESSION: Suspected COPD changes. No acute abnormalities. Electronically Signed   By: Ulyses Southward M.D.   On: 02/24/2016 15:29   Dg Chest Port 1 View  02/20/2016  CLINICAL DATA:  Hypotension, near syncopal episode EXAM: PORTABLE CHEST 1 VIEW COMPARISON:  02/07/2016 FINDINGS: right PICC line tip upper SVC level. Stable cardiomegaly without superimposed pneumonia, collapse or consolidation. No edema, effusion or pneumothorax. Aorta is ectatic and atherosclerotic. Degenerative changes of the spine and shoulders. IMPRESSION: Stable chest exam.  No superimposed acute process Electronically Signed   By: Judie Petit.   Shick M.D.   On: 02/20/2016 13:45   Dg Chest Port 1 View  02/07/2016  CLINICAL DATA:  PICC placement. EXAM: PORTABLE CHEST 1 VIEW COMPARISON:  03/26/2015 FINDINGS: A right PICC has been placed and terminates over the mid SVC. Thoracic aortic tortuosity is similar to the prior study. Cardiac silhouette is within normal limits for portable AP technique. Mild streaky opacity is present in the left greater than right lung bases. No sizable pleural effusion or pneumothorax is identified. No acute osseous abnormality is identified. IMPRESSION: 1. Right PICC terminating over the mid SVC. 2. Mild left basilar opacity which may reflect atelectasis versus infection. Minimal right basilar atelectasis or scarring. Electronically Signed   By: Sebastian Ache M.D.   On: 02/07/2016 18:05   Dg Foot Complete Left  02/02/2016  CLINICAL DATA:  82 year old male with a history of foot wound and prior amputation. EXAM: LEFT FOOT - COMPLETE 3+ VIEW COMPARISON:  10/18/2013, 03/26/2015 FINDINGS: Postoperative changes of prior first ray amputation again noted. In the interval, there has been destructive changes of the distal second metatarsal and distal third metatarsal, with periosteal reaction and disruption of the third metatarsal phalangeal joint. Destructive changes present at the proximal phalanx of the third toe. Soft tissue swelling at the third toe. Subcutaneous gas extends laterally from the surgical bed, adjacent to the distal second and third metatarsal. No acute fracture is identified. Soft tissue defect on the distal anterior calf, overlying the tibia. Vascular calcifications. IMPRESSION: Destructive changes of the distal second and third metatarsal as well as the proximal phalanx of the third toe, concerning for osteomyelitis given the adjacent soft tissue defect. Postoperative changes of prior left first ray amputation. Soft tissue defect on the lateral view of the anterior distal calf, potentially a second wound. Evidence  of tibial and pedal vascular disease. Signed, Yvone Neu. Loreta Ave, DO [facility] Electronically Signed   By: Gilmer Mor D.O.   On: 02/02/2016 12:52    2D Echo: Study Conclusions  - Left ventricle: The cavity size was normal. Wall thickness was  normal. Systolic function was normal. The estimated ejection  fraction was in the range of 60% to 65%. Wall motion was normal;  there were no regional wall motion abnormalities. Doppler  parameters are consistent with abnormal left ventricular  relaxation (grade 1 diastolic dysfunction). - Aortic valve: Valve mobility was restricted. There was very mild  stenosis. There was trivial regurgitation. - Left atrium: The atrium was mildly dilated.  Impressions:  - Normal LV systolic function; grade 1 diastolic dysfunction; mild  LAE; heavily calcified aortic valve with very mild AS and trace  AI.  Cardiac Cath:   Admission HPI: Mr. Reali is an  82 yo male with CKD3, AoCD, gout, PAD, and chronic ischemic ulcers of lower extremities. He was recently admitted on 4/2 due to hypotension and presycnope. During that admission, he was given IVF and electrolyte supplementation and discharged on 4/3. Since discharge, the patient had no complaints until this morning, when he complained of recurrent dizziness and nausea. He reports 2-3 episodes of nonbloody emesis. BP check at home showed sBP in the 90s. He presented to his clinic f/u appointment, where he was noted to be slightly confused, with BPs 60-80/30-60 and intermittently apneic. Per his wife, he would desaturate into the 60s until prompted to breathe, at which point he would return to 100% O2 sat. He denies chest pain, SOB, palpitations, irregular heart rhythm, focal weakness, trouble speaking, or LOC a/w the episode. He and wife report good PO intake with improved taste after completing antibiotics.   He recently completed 2  weeks of Ertapenem for his chronic lower extremity ischemic ulcers. However, would cultures drawn during last admission grew MRSA and Pseudomonas resistant to Imipenem.   In the ED, he was found to have BP 70/40 and afib with RVR at rates 100-170. Lactate was elevated to 2.5. Sodium was elevated to 150 and potassium low at 3.0. He was started on Diltiazem drip in the ED.   Hospital Course by problem list: Active Problems:   Hypotension   Sepsis due to undetermined organism (HCC)   Hypotension and AFib with RVR: Patient presented with RVR requiring Diltiazem drip for rate control.  He was controlled overnight with spontaneous reversion to normal sinus rhythm.  Diltiazem was weaned and patient was started on Metoprolol tartrate 12.5 mg  BID for continued rate control.  His CHADS-VASc is 3.  He was started on anticoagulation with Warfarin, with goal INR 2-3.   Acute on Chronic Anemia: His Hgb baseline is 7.5 - 8.5 chronically, diagnosed as anemia of chronic disease.  Overnight, his Hgb dropped to 6.0 due to unclear cause.  FOBT was negative.  He was transfused 2 U pRBCs and Hgb remained stable >8.5.  Hypokalemia: Patient was readmitted with low potassium.  He was started on KCl 40 mEq daily for supplementation, with scheduled follow up on Wednesday, 4/12.  At follow up, please check potassium and determine need/dosing of potassium supplementation.  Hypertension: Patient was previously managed with Lasix 40 mg PO BID, reduced at a previous admission to once daily dosing.  During this hospitalization, his BP elevated to 170-200 while on Metoprolol.  He was restarted on Lasix 40 mg BID for BP control.   Discharge Vitals:   BP 146/95 mmHg  Pulse 54  Temp(Src) 98.3 F (36.8 C) (Oral)  Resp 20  Ht 5\' 11"  (1.803 m)  Wt 160 lb 0.9 oz (72.6 kg)  BMI 22.33 kg/m2  SpO2 98%  Discharge Labs:  Results for orders placed or performed during the hospital encounter of 02/24/16 (from the past 24  hour(s))  Protime-INR     Status: None   Collection Time: 02/28/16  4:30 AM  Result Value Ref Range   Prothrombin Time 14.9 11.6 - 15.2 seconds   INR 1.15 0.00 - 1.49    Signed: Jana HalfNicholas A Sherisse Fullilove, MD 02/28/2016, 12:47 PM    Services Ordered on Discharge: continued HH RN, PT, OT Equipment Ordered on Discharge: none

## 2016-02-25 NOTE — Care Management Note (Addendum)
Case Management Note  Patient Details  Name: Vincent PigeonRobert T Miranda MRN: [id number] Date of Birth: [birth year]  Subjective/Objective:      Adm w hypotension              Action/Plan: lives w fam   Expected Discharge Date:                  Expected Discharge Plan:  Home w Home Health Services  In-House Referral:     Discharge planning Services  CM Consult  Post Acute Care Choice:  Resumption of Svcs/PTA Provider Choice offered to:     DME Arranged:    DME Agency:     HH Arranged:  RN, PT, OT HH Agency:  [facility]  Status of Service:     Medicare Important Message Given:    Date Medicare IM Given:    Medicare IM give by:    Date Additional Medicare IM Given:    Additional Medicare Important Message give by:     If discussed at Long Length of Stay Meetings, dates discussed:    Additional Comments:ur review done. Act w [facility]. Lupita LeashDonna [facility] rep aware. [facility] states limited help at home and may need snf. Will make sw ref.  [name], [name], RN 02/25/2016, 11:06 AM

## 2016-02-25 NOTE — Progress Notes (Signed)
  Echocardiogram 2D Echocardiogram has been performed.  Vincent Miranda, Vincent Miranda 02/25/2016, 10:53 AM

## 2016-02-25 NOTE — Progress Notes (Signed)
CRITICAL VALUE ALERT  Critical value received:  Hemoglobin 6.0  Date of notification:  02/25/15  Time of notification:  0439  Critical value read back:Yes.    Nurse who received alert:  Cay SchillingsShelby Mikalah Skyles, RN  MD notified (1st page):  Kyung RuddKennedy, MD  Time of first page:  [phone]  Responding MD: Kyung RuddKennedy  Time MD responded:  0505  Will order 2U PRBCs. Will obtain patient consent and type & screen.

## 2016-02-25 NOTE — Progress Notes (Signed)
[facility] culture sensitivity should be ready 4/8.

## 2016-02-25 NOTE — Progress Notes (Signed)
ANTICOAGULATION CONSULT NOTE - Follow Up Consult  Pharmacy Consult for heparin Indication: atrial fibrillation  Labs:  Recent Labs  02/24/16 1450 02/24/16 2150 02/25/16 0425 02/25/16 0426  HGB 8.2*  --  6.0*  --   HCT 26.5*  --  19.6*  --   PLT 146*  --  123*  --   HEPARINUNFRC  --   --   --  0.26*  CREATININE 2.22*  --   --   --   TROPONINI 0.06* 0.07*  --   --      Assessment: 82yo male subtherapeutic on heparin with initial dosing for Afib.  Goal of Therapy:  Heparin level 0.3-0.7 units/ml   Plan:  Will increase heparin gtt by 2 units/kg/hr to 1200 units/hr and check level in 8hr.  Vernard GamblesVeronda Ernestene Coover, PharmD, BCPS  02/25/2016,4:50 AM

## 2016-02-25 NOTE — Progress Notes (Signed)
Subjective: NAEON.  He denies chest pain, SOB, or palpitations.  He is apprehensive about the blood transfusion, but will receive because we think it is helpful.  Objective: Vital signs in last 24 hours: Filed Vitals:   02/25/16 0830 02/25/16 0854 02/25/16 0909 02/25/16 1000  BP: 117/58  123/69 115/66  Pulse: 55 59 64 65  Temp: 98.2 F (36.8 C)  98 F (36.7 C)   TempSrc: Oral  Axillary   Resp: 14 11 19 24   Height:      Weight:      SpO2: 100% 100% 100% 100%   Weight change:   Intake/Output Summary (Last 24 hours) at 02/25/16 1048 Last data filed at 02/25/16 1000  Gross per 24 hour  Intake 1940.19 ml  Output    725 ml  Net 1215.19 ml   Physical Exam  Constitutional: He is oriented to person, place, and time.  Elderly, chronically ill appearing male, lying in bed, NAD  HENT:  Head: Normocephalic and atraumatic.  Eyes: EOM are normal. No scleral icterus.  Neck: No tracheal deviation present.  Cardiovascular: Normal rate, regular rhythm and normal heart sounds.   Pulmonary/Chest: No stridor.  Poor inspiratory effort. Minimal crackles in bilateral bases. No wheezes.  Abdominal: Soft. He exhibits no distension. There is no tenderness. There is no rebound and no guarding.  Musculoskeletal: He exhibits no edema.  Neurological: He is alert and oriented to person, place, and time.  Skin: Skin is warm and dry.  Chronic ulcers on lower extremities bandaged.    Lab Results: Basic Metabolic Panel:  Recent Labs Lab 02/20/16 1900  02/24/16 1450 02/24/16 1703 02/25/16 0425  NA 149*  < > 150*  --  148*  K 3.3*  < > 3.0*  --  3.2*  CL 112*  < > 110  --  114*  CO2 28  < > 29  --  27  GLUCOSE 114*  < > 115*  --  84  BUN 15  < > 19  --  17  CREATININE 1.82*  < > 2.22*  --  1.82*  CALCIUM 8.7*  < > 9.3  --  8.0*  MG 1.6*  --   --  1.7  --   < > = values in this interval not displayed. Liver Function Tests:  Recent Labs Lab 02/20/16 1340 02/24/16 1450  AST 11* 13*    ALT 7* 6*  ALKPHOS 61 72  BILITOT 0.5 0.5  PROT 7.2 7.4  ALBUMIN 1.8* 1.9*    Recent Labs Lab 02/20/16 1340  LIPASE 24   No results for input(s): AMMONIA in the last 168 hours. CBC:  Recent Labs Lab 02/20/16 1340 02/24/16 1450 02/25/16 0425  WBC 6.0 6.7 5.1  NEUTROABS 5.0 4.9  --   HGB 7.5* 8.2* 6.0*  HCT 23.5* 26.5* 19.6*  MCV 86.7 87.5 87.1  PLT 156 146* 123*   Cardiac Enzymes:  Recent Labs Lab 02/24/16 1450 02/24/16 2150 02/25/16 0425  TROPONINI 0.06* 0.07* 0.07*   BNP: No results for input(s): PROBNP in the last 168 hours. D-Dimer: No results for input(s): DDIMER in the last 168 hours. CBG:  Recent Labs Lab 02/20/16 1252 02/21/16 0733  GLUCAP 123* 69   Hemoglobin A1C: No results for input(s): HGBA1C in the last 168 hours. Fasting Lipid Panel: No results for input(s): CHOL, HDL, LDLCALC, TRIG, CHOLHDL, LDLDIRECT in the last 168 hours. Thyroid Function Tests:  Recent Labs Lab 02/25/16 0350  TSH 1.295  Coagulation: No results for input(s): LABPROT, INR in the last 168 hours. Anemia Panel: No results for input(s): VITAMINB12, FOLATE, FERRITIN, TIBC, IRON, RETICCTPCT in the last 168 hours. Urine Drug Screen: Drugs of Abuse  No results found for: LABOPIA, COCAINSCRNUR, LABBENZ, AMPHETMU, THCU, LABBARB  Alcohol Level: No results for input(s): ETH in the last 168 hours. Urinalysis:  Recent Labs Lab 02/20/16 1338 02/24/16 2306  COLORURINE YELLOW STRAW*  LABSPEC 1.011 1.003*  PHURINE 6.0 5.5  GLUCOSEU NEGATIVE NEGATIVE  HGBUR MODERATE* LARGE*  BILIRUBINUR NEGATIVE NEGATIVE  KETONESUR NEGATIVE NEGATIVE  PROTEINUR 30* NEGATIVE  NITRITE NEGATIVE NEGATIVE  LEUKOCYTESUR TRACE* TRACE*   Misc. Labs:   Micro Results: Recent Results (from the past 240 hour(s))  Culture, Urine     Status: None   Collection Time: 02/15/16  5:05 PM  Result Value Ref Range Status   Urine Culture, Routine Final report  Final   Urine Culture result 1 No  growth  Final  Microscopic Examination     Status: Abnormal   Collection Time: 02/15/16  5:05 PM  Result Value Ref Range Status   WBC, UA 0-5 0 -  5 /hpf Final   RBC, UA 0-2 0 -  2 /hpf Final   Epithelial Cells (non renal) None seen 0 - 10 /hpf Final   Casts Present (A) None seen /lpf Final   Cast Type Hyaline casts N/A Final   Mucus, UA Present Not Estab. Final   Bacteria, UA Few None seen/Few Final  Wound culture     Status: None (Preliminary result)   Collection Time: 02/20/16  1:07 PM  Result Value Ref Range Status   Specimen Description FOOT RIGHT  Final   Special Requests FOOT LEFT  Final   Gram Stain   Final    RARE WBC PRESENT, PREDOMINANTLY PMN RARE SQUAMOUS EPITHELIAL CELLS PRESENT NO ORGANISMS SEEN Performed at [facility]    Culture   Final    FEW PSEUDOMONAS AERUGINOSA STAPHYLOCOCCUS AUREUS Note: RIFAMPIN AND GENTAMICIN SHOULD NOT BE USED AS SINGLE DRUGS FOR TREATMENT OF STAPH INFECTIONS. Performed at [facility]    Report Status PENDING  Incomplete   Organism ID, Bacteria PSEUDOMONAS AERUGINOSA  Final      Susceptibility   Pseudomonas aeruginosa - MIC*    CEFEPIME <=1 SENSITIVE Sensitive     CEFTAZIDIME <=1 SENSITIVE Sensitive     CIPROFLOXACIN <=0.25 SENSITIVE Sensitive     GENTAMICIN <=1 SENSITIVE Sensitive     IMIPENEM >=16 RESISTANT Resistant     PIP/TAZO <=4 SENSITIVE Sensitive     TOBRAMYCIN <=1 SENSITIVE Sensitive     * FEW PSEUDOMONAS AERUGINOSA  Urine culture     Status: None   Collection Time: 02/20/16  1:38 PM  Result Value Ref Range Status   Specimen Description URINE, CATHETERIZED  Final   Special Requests NONE  Final   Culture NO GROWTH 1 DAY  Final   Report Status 02/21/2016 FINAL  Final  Blood Culture (routine x 2)     Status: None (Preliminary result)   Collection Time: 02/20/16  2:13 PM  Result Value Ref Range Status   Specimen Description BLOOD RIGHT HAND  Final   Special Requests BOTTLES DRAWN AEROBIC ONLY 5 CC   Final   Culture NO GROWTH 4 DAYS  Final   Report Status PENDING  Incomplete  Blood Culture (routine x 2)     Status: None (Preliminary result)   Collection Time: 02/20/16  2:22 PM  Result Value  Ref Range Status   Specimen Description BLOOD LEFT HAND  Final   Special Requests BOTTLES DRAWN AEROBIC ONLY 5 CC  Final   Culture NO GROWTH 4 DAYS  Final   Report Status PENDING  Incomplete  Wound culture     Status: None   Collection Time: 02/20/16  4:18 PM  Result Value Ref Range Status   Specimen Description FOOT  Final   Special Requests NONE  Final   Gram Stain   Final    RARE WBC PRESENT, PREDOMINANTLY PMN NO SQUAMOUS EPITHELIAL CELLS SEEN RARE GRAM POSITIVE COCCI IN CLUSTERS Performed at [facility]    Culture   Final    ABUNDANT METHICILLIN RESISTANT STAPHYLOCOCCUS AUREUS Note: RIFAMPIN AND GENTAMICIN SHOULD NOT BE USED AS SINGLE DRUGS FOR TREATMENT OF STAPH INFECTIONS. Performed at [facility]    Report Status 02/23/2016 FINAL  Final   Organism ID, Bacteria METHICILLIN RESISTANT STAPHYLOCOCCUS AUREUS  Final      Susceptibility   Methicillin resistant staphylococcus aureus - MIC*    CLINDAMYCIN >=8 RESISTANT Resistant     ERYTHROMYCIN >=8 RESISTANT Resistant     GENTAMICIN <=0.5 SENSITIVE Sensitive     LEVOFLOXACIN 4 INTERMEDIATE Intermediate     OXACILLIN >=4 RESISTANT Resistant     RIFAMPIN <=0.5 SENSITIVE Sensitive     TRIMETH/SULFA <=10 SENSITIVE Sensitive     VANCOMYCIN <=0.5 SENSITIVE Sensitive     TETRACYCLINE <=1 SENSITIVE Sensitive     * ABUNDANT METHICILLIN RESISTANT STAPHYLOCOCCUS AUREUS   Studies/Results: Ct Head Wo Contrast  02/24/2016  CLINICAL DATA:  Altered mental status, dizziness, hypotension EXAM: CT HEAD WITHOUT CONTRAST TECHNIQUE: Contiguous axial images were obtained from the base of the skull through the vertex without intravenous contrast. COMPARISON:  03/26/2015 FINDINGS: Cavum septum pellucidum. Otherwise normal ventricular  morphology. No midline shift or mass effect. Asymmetric positioning in gantry. Small vessel chronic ischemic changes of deep cerebral white matter. No intracranial hemorrhage, mass lesion, or evidence of acute infarction. No extra-axial fluid collections. Visualized paranasal sinuses and mastoid air cells clear. Small nonspecific mixed lytic and sclerotic process at the RIGHT sphenoid image 18 is unchanged since 03/26/2015. New line scattered atherosclerotic calcifications at skullbase. IMPRESSION: Atrophy with small vessel chronic ischemic changes of deep cerebral white matter. No acute intracranial abnormalities. Electronically Signed   By: Ulyses Southward M.D.   On: 02/24/2016 17:20   Dg Chest Port 1 View  02/24/2016  CLINICAL DATA:  Hypotension for 1 day, history stage III chronic kidney disease, hypertension, former smoker EXAM: PORTABLE CHEST 1 VIEW COMPARISON:  Portable exam 1521 hours compared to 02/20/2016 FINDINGS: RIGHT arm PICC line with tip projecting over proximal SVC. Upper normal heart size. Calcified tortuous aorta. Mediastinal contours and pulmonary vascularity normal. Emphysematous changes without infiltrate, pleural effusion or pneumothorax. Glenohumeral degenerative changes and probable chronic rotator cuff tears bilaterally. IMPRESSION: Suspected COPD changes. No acute abnormalities. Electronically Signed   By: Ulyses Southward M.D.   On: 02/24/2016 15:29   Medications: I have reviewed the patient's current medications. Scheduled Meds: . sodium chloride   Intravenous Once  . antiseptic oral rinse  7 mL Mouth Rinse BID  . aspirin EC  81 mg Oral [id number]  . metoprolol tartrate  12.5 mg Oral BID  . pantoprazole  40 mg Oral Daily  . sodium chloride flush  3 mL Intravenous Q12H   Continuous Infusions: . sodium chloride 100 mL/hr at 02/25/16 0624  . diltiazem (CARDIZEM) infusion Stopped (02/25/16 0906)  PRN Meds:.ondansetron **OR** ondansetron (ZOFRAN) IV Assessment/Plan: Active Problems:    Hypotension   Sepsis due to undetermined organism [facility](HCC)  [name] is an 82 yo male with CKD3, AoCD, gout, and PAD, [facility] of multiple lower extremity ulcers, presenting with hypotension.  Hypotension and AFib with RVR, improved: The cause of his afib unclear, possibly 2/2 ongoing LE infection or his dehydration/hypovolemia. Now sinus rhythm at slow rate.  TSH normal.  CHADS-VASc at least 3 and previous echo pending. HAS-BLED score at least 2 with acute drop in Hgb overnight.  Patient has h/o PUD and possibly bloody emesis.  Will hold anticoagulation at this time while working up edema. - Telemetry - Wean Dilt drip - Metoprolol 12.5 mg BID - HOLD anticoagulation [ ]  TTE   Acute on Chronic Anemia: Patient has h/o AOCD with baseline 7.5-8.5.  Overnight, Hgb dropped to 6.0 for unclear reason.  No reported emesis or bloody BM.  He has a h/o PUD, but only uses Naproxen <2-3 times per week.   - 2 U pRBCs [ ]  FOBT - HOLD anticoagulation [ ]  CBC PM  AKI on CKD3, resolved: Cr elevated to 2.22 (baseline ~1.8-1.9). Given 3L in ED. Given his hypotension, most likely etiology is pre-renal. Will manage conservatively by checking Cr on repeat BMP in AM.   Hypokalemia and Hypernatremia: Na 150 and K 3.0 in the ED. No changes c/f hypokalemia on ECG. Patient given 2 runs of KCl IV in ED. Both possibly explained by GI losses 2/2 vomiting. Also possible he is having insensible losses from drainage of leg wounds.  - Kdur 40 mEq daily  LE Ulcers, stable: Patient completed 2 weeks of Ertapenem IV, but last cultures grew MRSA and Pseudomonas resistant to Imipenem. As sterilization of wounds is impossible and no signs of worsening infection, will hold antibiotics and plan for outpatient follow up. - HOLD antibiotics - Wound care  FEN/GI: - Regular  DVT Ppx: heparin  Dispo: Disposition is deferred at this time, awaiting improvement of current medical  problems.  Anticipated discharge in approximately 1-3 day(s).   The patient does have a current PCP (No Pcp Per Patient) and [facility] after [facility].  The patient does not have transportation limitations that hinder transportation to clinic appointments.  .Services Needed at time of discharge: Y = Yes, Blank = No PT:   OT:   RN:   Equipment:   Other:     LOS: 1 day   Jana HalfNicholas A Ameilia Rattan, MD 02/25/2016, 10:48 AM

## 2016-02-26 LAB — BASIC METABOLIC PANEL
ANION GAP: 7 (ref 5–15)
BUN: 16 mg/dL (ref 6–20)
CO2: 26 mmol/L (ref 22–32)
Calcium: 8.6 mg/dL — ABNORMAL LOW (ref 8.9–10.3)
Chloride: 116 mmol/L — ABNORMAL HIGH (ref 101–111)
Creatinine, Ser: 1.93 mg/dL — ABNORMAL HIGH (ref 0.61–1.24)
GFR, EST AFRICAN AMERICAN: 34 mL/min — AB (ref >60)
GFR, EST NON AFRICAN AMERICAN: 29 mL/min — AB (ref >60)
Glucose, Bld: 75 mg/dL (ref 65–99)
Potassium: 4.2 mmol/L (ref 3.5–5.1)
Sodium: 149 mmol/L — ABNORMAL HIGH (ref 135–145)

## 2016-02-26 LAB — URINE CULTURE: Culture: NO GROWTH

## 2016-02-26 LAB — TYPE AND SCREEN
ABO/RH(D): O POS
Antibody Screen: NEGATIVE
UNIT DIVISION: 0
Unit division: 0

## 2016-02-26 LAB — WOUND CULTURE

## 2016-02-26 LAB — CBC
HCT: 27.9 % — ABNORMAL LOW (ref 39.0–52.0)
Hemoglobin: 8.6 g/dL — ABNORMAL LOW (ref 13.0–17.0)
MCH: 26.8 pg (ref 26.0–34.0)
MCHC: 30.8 g/dL (ref 30.0–36.0)
MCV: 86.9 fL (ref 78.0–100.0)
PLATELETS: 130 10*3/uL — AB (ref 150–400)
RBC: 3.21 MIL/uL — ABNORMAL LOW (ref 4.22–5.81)
RDW: 17.4 % — ABNORMAL HIGH (ref 11.5–15.5)
WBC: 6.6 10*3/uL (ref 4.0–10.5)

## 2016-02-26 MED ORDER — SODIUM CHLORIDE 0.9% FLUSH
10.0000 mL | Freq: Two times a day (BID) | INTRAVENOUS | Status: DC
  Administered 2016-02-26 – 2016-02-28 (×5): 10 mL

## 2016-02-26 MED ORDER — SODIUM CHLORIDE 0.9% FLUSH: 10.0000 mL | INTRAVENOUS | Status: DC | PRN

## 2016-02-26 NOTE — Progress Notes (Signed)
Subjective: No acute events overnight. He states he does not understand why his leg wounds are not healing. I tried explaining to him several times that it is due to his PVD but he does not want to listen. I discussed his echo results with him and that we are considering anticoagulation for him with his new AFib but are holding off right now   Objective: Vital signs in last 24 hours: Filed Vitals:   02/26/16 1000 02/26/16 1100 02/26/16 1200 02/26/16 1252  BP: 146/73  150/78   Pulse: 36 65 64   Temp:    97.8 F (36.6 C)  TempSrc:    Oral  Resp: 18  19   Height:      Weight:      SpO2: 95% 94% 95%    Weight change:   Intake/Output Summary (Last 24 hours) at 02/26/16 1452 Last data filed at 02/26/16 0600  Gross per 24 hour  Intake    725 ml  Output    865 ml  Net   -140 ml   Physical Exam  Constitutional: He is oriented to person, place, and time.  Elderly, chronically ill appearing male, lying in bed, NAD  HENT:  Head: Normocephalic and atraumatic.  Eyes: EOM are normal. No scleral icterus.  Neck: No tracheal deviation present.  Cardiovascular: Normal rate, regular rhythm and normal heart sounds.   Pulmonary/Chest: No stridor.  Poor inspiratory effort. Minimal crackles in bilateral bases. No wheezes.  Abdominal: Soft. He exhibits no distension. There is no tenderness. There is no rebound and no guarding.  Musculoskeletal: He exhibits no edema.  Neurological: He is alert and oriented to person, place, and time.  Skin: Skin is warm and dry.  Chronic ulcers on lower extremities bandaged.    Lab Results: Basic Metabolic Panel:  Recent Labs Lab 02/20/16 1900  02/24/16 1703 02/25/16 0425 02/26/16 0350  NA 149*  < >  --  148* 149*  K 3.3*  < >  --  3.2* 4.2  CL 112*  < >  --  114* 116*  CO2 28  < >  --  27 26  GLUCOSE 114*  < >  --  84 75  BUN 15  < >  --  17 16  CREATININE 1.82*  < >  --  1.82* 1.93*  CALCIUM 8.7*  < >  --  8.0* 8.6*  MG 1.6*  --  1.7  --   --    < > = values in this interval not displayed. Liver Function Tests:  Recent Labs Lab 02/20/16 1340 02/24/16 1450  AST 11* 13*  ALT 7* 6*  ALKPHOS 61 72  BILITOT 0.5 0.5  PROT 7.2 7.4  ALBUMIN 1.8* 1.9*    Recent Labs Lab 02/20/16 1340  LIPASE 24   CBC:  Recent Labs Lab 02/20/16 1340 02/24/16 1450  02/25/16 1730 02/26/16 0350  WBC 6.0 6.7  < > 6.3 6.6  NEUTROABS 5.0 4.9  --   --   --   HGB 7.5* 8.2*  < > 8.3* 8.6*  HCT 23.5* 26.5*  < > 27.0* 27.9*  MCV 86.7 87.5  < > 86.0 86.9  PLT 156 146*  < > 119* 130*  < > = values in this interval not displayed. Cardiac Enzymes:  Recent Labs Lab 02/24/16 1450 02/24/16 2150 02/25/16 0425  TROPONINI 0.06* 0.07* 0.07*   CBG:  Recent Labs Lab 02/20/16 1252 02/21/16 0733  GLUCAP 123* 69  Thyroid Function Tests:  Recent Labs Lab 02/25/16 0350  TSH 1.295   Urinalysis:  Recent Labs Lab 02/20/16 1338 02/24/16 2306  COLORURINE YELLOW STRAW*  LABSPEC 1.011 1.003*  PHURINE 6.0 5.5  GLUCOSEU NEGATIVE NEGATIVE  HGBUR MODERATE* LARGE*  BILIRUBINUR NEGATIVE NEGATIVE  KETONESUR NEGATIVE NEGATIVE  PROTEINUR 30* NEGATIVE  NITRITE NEGATIVE NEGATIVE  LEUKOCYTESUR TRACE* TRACE*   Micro Results: Recent Results (from the past 240 hour(s))  Wound culture     Status: None   Collection Time: 02/20/16  1:07 PM  Result Value Ref Range Status   Specimen Description FOOT RIGHT  Final   Special Requests FOOT LEFT  Final   Gram Stain   Final    RARE WBC PRESENT, PREDOMINANTLY PMN RARE SQUAMOUS EPITHELIAL CELLS PRESENT NO ORGANISMS SEEN Performed at [facility]    Culture   Final    FEW PSEUDOMONAS AERUGINOSA FEW METHICILLIN RESISTANT STAPHYLOCOCCUS AUREUS Note: RIFAMPIN AND GENTAMICIN SHOULD NOT BE USED AS SINGLE DRUGS FOR TREATMENT OF STAPH INFECTIONS. Performed at [facility]    Report Status 02/26/2016 FINAL  Final   Organism ID, Bacteria PSEUDOMONAS AERUGINOSA  Final   Organism ID, Bacteria  METHICILLIN RESISTANT STAPHYLOCOCCUS AUREUS  Final      Susceptibility   Pseudomonas aeruginosa - MIC*    CEFEPIME <=1 SENSITIVE Sensitive     CEFTAZIDIME <=1 SENSITIVE Sensitive     CIPROFLOXACIN <=0.25 SENSITIVE Sensitive     GENTAMICIN <=1 SENSITIVE Sensitive     IMIPENEM >=16 RESISTANT Resistant     PIP/TAZO <=4 SENSITIVE Sensitive     TOBRAMYCIN <=1 SENSITIVE Sensitive     * FEW PSEUDOMONAS AERUGINOSA   Methicillin resistant staphylococcus aureus - MIC*    CLINDAMYCIN >=8 RESISTANT Resistant     ERYTHROMYCIN >=8 RESISTANT Resistant     GENTAMICIN <=0.5 SENSITIVE Sensitive     LEVOFLOXACIN 4 INTERMEDIATE Intermediate     OXACILLIN >=4 RESISTANT Resistant     RIFAMPIN <=0.5 SENSITIVE Sensitive     TRIMETH/SULFA <=10 SENSITIVE Sensitive     VANCOMYCIN <=0.5 SENSITIVE Sensitive     TETRACYCLINE <=1 SENSITIVE Sensitive     * FEW METHICILLIN RESISTANT STAPHYLOCOCCUS AUREUS  Urine culture     Status: None   Collection Time: 02/20/16  1:38 PM  Result Value Ref Range Status   Specimen Description URINE, CATHETERIZED  Final   Special Requests NONE  Final   Culture NO GROWTH 1 DAY  Final   Report Status 02/21/2016 FINAL  Final  Blood Culture (routine x 2)     Status: None   Collection Time: 02/20/16  2:13 PM  Result Value Ref Range Status   Specimen Description BLOOD RIGHT HAND  Final   Special Requests BOTTLES DRAWN AEROBIC ONLY 5 CC  Final   Culture NO GROWTH 5 DAYS  Final   Report Status 02/25/2016 FINAL  Final  Blood Culture (routine x 2)     Status: None   Collection Time: 02/20/16  2:22 PM  Result Value Ref Range Status   Specimen Description BLOOD LEFT HAND  Final   Special Requests BOTTLES DRAWN AEROBIC ONLY 5 CC  Final   Culture NO GROWTH 5 DAYS  Final   Report Status 02/25/2016 FINAL  Final  Wound culture     Status: None   Collection Time: 02/20/16  4:18 PM  Result Value Ref Range Status   Specimen Description FOOT  Final   Special Requests NONE  Final   Gram  Stain  Final    RARE WBC PRESENT, PREDOMINANTLY PMN NO SQUAMOUS EPITHELIAL CELLS SEEN RARE GRAM POSITIVE COCCI IN CLUSTERS Performed at [facility]    Culture   Final    ABUNDANT METHICILLIN RESISTANT STAPHYLOCOCCUS AUREUS Note: RIFAMPIN AND GENTAMICIN SHOULD NOT BE USED AS SINGLE DRUGS FOR TREATMENT OF STAPH INFECTIONS. Performed at [facility]    Report Status 02/23/2016 FINAL  Final   Organism ID, Bacteria METHICILLIN RESISTANT STAPHYLOCOCCUS AUREUS  Final      Susceptibility   Methicillin resistant staphylococcus aureus - MIC*    CLINDAMYCIN >=8 RESISTANT Resistant     ERYTHROMYCIN >=8 RESISTANT Resistant     GENTAMICIN <=0.5 SENSITIVE Sensitive     LEVOFLOXACIN 4 INTERMEDIATE Intermediate     OXACILLIN >=4 RESISTANT Resistant     RIFAMPIN <=0.5 SENSITIVE Sensitive     TRIMETH/SULFA <=10 SENSITIVE Sensitive     VANCOMYCIN <=0.5 SENSITIVE Sensitive     TETRACYCLINE <=1 SENSITIVE Sensitive     * ABUNDANT METHICILLIN RESISTANT STAPHYLOCOCCUS AUREUS  Blood Culture (routine x 2)     Status: None (Preliminary result)   Collection Time: 02/24/16  2:56 PM  Result Value Ref Range Status   Specimen Description BLOOD LEFT FOREARM  Final   Special Requests BOTTLES DRAWN AEROBIC AND ANAEROBIC 5CC  Final   Culture NO GROWTH 2 DAYS  Final   Report Status PENDING  Incomplete  Blood Culture (routine x 2)     Status: None (Preliminary result)   Collection Time: 02/24/16  3:40 PM  Result Value Ref Range Status   Specimen Description BLOOD LEFT HAND  Final   Special Requests BOTTLES DRAWN AEROBIC AND ANAEROBIC 5CC  Final   Culture NO GROWTH 2 DAYS  Final   Report Status PENDING  Incomplete  Urine culture     Status: None   Collection Time: 02/24/16 11:06 PM  Result Value Ref Range Status   Specimen Description URINE, CATHETERIZED  Final   Special Requests NONE  Final   Culture NO GROWTH 1 DAY  Final   Report Status 02/26/2016 FINAL  Final   Studies/Results: Ct Head  Wo Contrast  02/24/2016  CLINICAL DATA:  Altered mental status, dizziness, hypotension EXAM: CT HEAD WITHOUT CONTRAST TECHNIQUE: Contiguous axial images were obtained from the base of the skull through the vertex without intravenous contrast. COMPARISON:  03/26/2015 FINDINGS: Cavum septum pellucidum. Otherwise normal ventricular morphology. No midline shift or mass effect. Asymmetric positioning in gantry. Small vessel chronic ischemic changes of deep cerebral white matter. No intracranial hemorrhage, mass lesion, or evidence of acute infarction. No extra-axial fluid collections. Visualized paranasal sinuses and mastoid air cells clear. Small nonspecific mixed lytic and sclerotic process at the RIGHT sphenoid image 18 is unchanged since 03/26/2015. New line scattered atherosclerotic calcifications at skullbase. IMPRESSION: Atrophy with small vessel chronic ischemic changes of deep cerebral white matter. No acute intracranial abnormalities. Electronically Signed   By: Ulyses SouthwardMark  Boles M.D.   On: 02/24/2016 17:20   Dg Chest Port 1 View  02/24/2016  CLINICAL DATA:  Hypotension for 1 day, history stage III chronic kidney disease, hypertension, former smoker EXAM: PORTABLE CHEST 1 VIEW COMPARISON:  Portable exam 1521 hours compared to 02/20/2016 FINDINGS: RIGHT arm PICC line with tip projecting over proximal SVC. Upper normal heart size. Calcified tortuous aorta. Mediastinal contours and pulmonary vascularity normal. Emphysematous changes without infiltrate, pleural effusion or pneumothorax. Glenohumeral degenerative changes and probable chronic rotator cuff tears bilaterally. IMPRESSION: Suspected COPD changes. No acute abnormalities. Electronically  Signed   By: Ulyses Southward M.D.   On: 02/24/2016 15:29   Medications: I have reviewed the patient's current medications. Scheduled Meds: . antiseptic oral rinse  7 mL Mouth Rinse BID  . aspirin EC  81 mg Oral [id number]  . metoprolol tartrate  12.5 mg Oral BID  . pantoprazole   40 mg Oral Daily  . potassium chloride  40 mEq Oral Daily  . sodium chloride flush  10-40 mL Intracatheter Q12H  . sodium chloride flush  3 mL Intravenous Q12H   Continuous Infusions: . diltiazem (CARDIZEM) infusion Stopped (02/25/16 0906)   PRN Meds:.ondansetron **OR** ondansetron (ZOFRAN) IV, sodium chloride flush Assessment/Plan:  Mr. Coker is an 82 yo male with CKD3, AoCD, gout, and PAD, recently discharged from [facility] for treatment of multiple lower extremity ulcers, presenting with hypotension.  Hypotension and AFib with RVR, Now Paroxysmal AFib: The cause of his afib unclear, possibly 2/2 ongoing LE infection or his dehydration/hypovolemia. Now sinus rhythm at slow rate.  TSH normal.  CHADS-VASc at least 3 and previous echo pending.Echo from yesterday shows EF 60-65%, grade 1 diastolic dysfunction, heavily calcified AV with very mild AS. He would benefit from [facility] but his recent unexplained drop in Hgb from 8 to 6.2 and his history of PUD and possible bloody emesis makes this decision difficult. His Hgb is stable at 8.6 after 2 units PRBCs. If he does not have any further bleeding then may start [facility] tomorrow or possibly wait until outpatient.  - Telemetry - Dilt drip off  - Continue Metoprolol 12.5 mg BID - HOLD anticoagulation  Acute on Chronic Anemia: Patient has h/o AOCD with baseline 7.5-8.5.  On 4/7 Hgb dropped to 6.0 for unclear reason.  No reported emesis or bloody BM.  He has a h/o PUD, but only uses Naproxen <2-3 times per week.   [ ]  FOBT - HOLD anticoagulation - CBC in AM  AKI on CKD3, resolved: Cr elevated to 2.22 (baseline ~1.8-1.9) on admission. Given his hypotension, most likely etiology is pre-renal.His Cr improved to 1.93 this morning which is near his baseline. - bmet in AM  Hypokalemia and Hypernatremia: Na 150 and K 3.0 on admission.Today his Na is 149 and K 4.2. Both possibly explained by GI losses 2/2 vomiting. Also possible he is having insensible losses  from drainage of leg wounds.  - Continue to monitor  LE Ulcers, stable: Patient completed 2 weeks of Ertapenem IV, but last cultures grew MRSA and Pseudomonas resistant to Imipenem. As sterilization of wounds is impossible and no signs of worsening infection, will hold antibiotics and plan for outpatient follow up. - HOLD antibiotics - Wound care  FEN/GI: - Regular  DVT Ppx: heparin  Dispo: Disposition is deferred at this time, awaiting improvement of current medical problems.  Anticipated discharge in approximately 1-3 day(s).   The patient does have a current PCP (No Pcp Per Patient) and does need an [facility] follow-up appointment after discharge.  The patient does not have transportation limitations that hinder transportation to clinic appointments.  .Services Needed at time of discharge: Y = Yes, Blank = No PT:   OT:   RN:   Equipment:   Other:     LOS: 2 days   Su Hoff, MD 02/26/2016, 2:52 PM

## 2016-02-27 LAB — CBC
HEMATOCRIT: 30.7 % — AB (ref 39.0–52.0)
Hemoglobin: 9.5 g/dL — ABNORMAL LOW (ref 13.0–17.0)
MCH: 27 pg (ref 26.0–34.0)
MCHC: 30.9 g/dL (ref 30.0–36.0)
MCV: 87.2 fL (ref 78.0–100.0)
PLATELETS: 136 10*3/uL — AB (ref 150–400)
RBC: 3.52 MIL/uL — ABNORMAL LOW (ref 4.22–5.81)
RDW: 17.4 % — AB (ref 11.5–15.5)
WBC: 5.9 10*3/uL (ref 4.0–10.5)

## 2016-02-27 LAB — BASIC METABOLIC PANEL
ANION GAP: 8 (ref 5–15)
BUN: 14 mg/dL (ref 6–20)
CO2: 26 mmol/L (ref 22–32)
Calcium: 9.4 mg/dL (ref 8.9–10.3)
Chloride: 114 mmol/L — ABNORMAL HIGH (ref 101–111)
Creatinine, Ser: 1.89 mg/dL — ABNORMAL HIGH (ref 0.61–1.24)
GFR, EST AFRICAN AMERICAN: 35 mL/min — AB (ref >60)
GFR, EST NON AFRICAN AMERICAN: 30 mL/min — AB (ref >60)
Glucose, Bld: 82 mg/dL (ref 65–99)
POTASSIUM: 4.3 mmol/L (ref 3.5–5.1)
Sodium: 148 mmol/L — ABNORMAL HIGH (ref 135–145)

## 2016-02-27 MED ORDER — WARFARIN SODIUM 2 MG PO TABS
3.0000 mg | ORAL_TABLET | Freq: Once | ORAL | Status: AC
  Administered 2016-02-27: 3 mg via ORAL
  Filled 2016-02-27 (×2): qty 1.5

## 2016-02-27 MED ORDER — WARFARIN - PHARMACIST DOSING INPATIENT
Freq: Every day | Status: DC
  Administered 2016-02-27: 19:00:00

## 2016-02-27 MED ORDER — APIXABAN 2.5 MG PO TABS: 2.5000 mg | ORAL_TABLET | Freq: Two times a day (BID) | ORAL | Status: DC

## 2016-02-27 NOTE — Progress Notes (Signed)
ANTICOAGULATION CONSULT NOTE - Follow Up Consult  Pharmacy Consult for warfarin Indication: atrial fibrillation  No Known Allergies  Patient Measurements: Height: 5\' 11"  (180.3 cm) Weight: 160 lb 0.9 oz (72.6 kg) IBW/kg (Calculated) : 75.3 Heparin Dosing Weight: 70 kg   Vital Signs: Temp: 98.5 F (36.9 C) (04/09 1111) Temp Source: Oral (04/09 1111) BP: 142/68 mmHg (04/09 1111) Pulse Rate: 56 (04/09 1111)  Labs:  Recent Labs  02/24/16 1450 02/24/16 2150 02/25/16 0425 02/25/16 0426 02/25/16 1730 02/26/16 0350 02/27/16 0530  HGB 8.2*  --  6.0*  --  8.3* 8.6* 9.5*  HCT 26.5*  --  19.6*  --  27.0* 27.9* 30.7*  PLT 146*  --  123*  --  119* 130* 136*  HEPARINUNFRC  --   --   --  0.26*  --   --   --   CREATININE 2.22*  --  1.82*  --   --  1.93* 1.89*  TROPONINI 0.06* 0.07* 0.07*  --   --   --   --     Estimated Creatinine Clearance: 27.2 mL/min (by C-G formula based on Cr of 1.89).   Medical History: Past Medical History  Diagnosis Date  . Hypertension   . Chronic kidney disease   . Anemia of chronic disease   . Gout   . Acute blood loss anemia 10/18/2013  . History of blood transfusion     Medications:  Prescriptions prior to admission  Medication Sig Dispense Refill Last Dose  . alfuzosin (UROXATRAL) 10 MG 24 hr tablet Take 1 tablet (10 mg total) by mouth daily with breakfast. 30 tablet 1 02/24/2016  . allopurinol (ZYLOPRIM) 100 MG tablet Take 1 tablet (100 mg total) by mouth 2 (two) times daily before a meal. (Patient taking differently: Take 100 mg by mouth daily. ) 60 tablet 0 02/24/2016  . aspirin EC 81 MG tablet Take 81 mg by mouth daily.    02/24/2016  . furosemide (LASIX) 40 MG tablet Take 1 tablet (40 mg total) by mouth daily. 30 tablet 0 02/24/2016  . Zinc 50 MG TABS Take 50 mg by mouth daily.   02/24/2016    Assessment: 89 YOM with new Afib started on IV heparin.  Patient was not on any anticoagulation prior to admission.  Heparin was stopped 4/7 with low h/h  and plan discussed for oral AC.  Cr 1.9 H/H improved pltc 130,  Afib CVR on metoprolol  Goal of Therapy:  INR 2-3 Monitor platelets by anticoagulation protocol: Yes   Plan:  Warfarin 3mg  x1 Daily Protime  Leota SauersLisa Cherica Heiden Pharm.D. CPP, BCPS Clinical Pharmacist [phone] 02/27/2016 1:15 PM

## 2016-02-27 NOTE — Progress Notes (Signed)
  Date: 02/27/2016  Patient name: Vincent PigeonRobert T Miranda  Medical record number: [id number]  Date of birth: [birth year]   This patient's plan of care was discussed with the house staff. Please see their note for complete details. I concur with their findings.   Inez CatalinaEmily B Mullen, MD 02/27/2016, 8:57 PM

## 2016-02-27 NOTE — Progress Notes (Signed)
Subjective: No acute events overnight. He is rate controlled on PO Lopressor and back in NSR. We discussed that he will need to be on anticoagulation for his AFib. He denies any chest pain, palpitations, SOB, or dizziness.   Objective: Vital signs in last 24 hours: Filed Vitals:   02/27/16 0819 02/27/16 1000 02/27/16 1100 02/27/16 1111  BP: 124/58 149/77  142/68  Pulse:   53 56  Temp: 97.5 F (36.4 C)   98.5 F (36.9 C)  TempSrc: Oral   Oral  Resp: Height:      Weight:      SpO2: 94%  95% 95%   Weight change:   Intake/Output Summary (Last 24 hours) at 02/27/16 1207 Last data filed at 02/27/16 1000  Gross per 24 hour  Intake    720 ml  Output   2050 ml  Net  -1330 ml   Physical Exam  Constitutional: He is oriented to person, place, and time.  Elderly, chronically ill appearing male, lying in bed, NAD  HENT:  Head: Normocephalic and atraumatic.  Eyes: EOM are normal. No scleral icterus.  Neck: No tracheal deviation present.  Cardiovascular: Normal rate, regular rhythm and normal heart sounds.   Pulmonary/Chest: No stridor. He has no wheezes. He has no rhonchi. He has no rales.  Abdominal: Soft. He exhibits no distension. There is no tenderness. There is no rebound and no guarding.  Musculoskeletal: He exhibits no edema.  Neurological: He is alert and oriented to person, place, and time.  Skin: Skin is warm and dry.  Chronic ulcers on lower extremities bandaged.    Lab Results: Basic Metabolic Panel:  Recent Labs Lab 02/20/16 1900  02/24/16 1703  02/26/16 0350 02/27/16 0530  NA 149*  < >  --   < > 149* 148*  K 3.3*  < >  --   < > 4.2 4.3  CL 112*  < >  --   < > 116* 114*  CO2 28  < >  --   < > 26 26  GLUCOSE 114*  < >  --   < > 75 82  BUN 15  < >  --   < > 16 14  CREATININE 1.82*  < >  --   < > 1.93* 1.89*  CALCIUM 8.7*  < >  --   < > 8.6* 9.4  MG 1.6*  --  1.7  --   --   --   < > = values in this interval not displayed. Liver Function  Tests:  Recent Labs Lab 02/20/16 1340 02/24/16 1450  AST 11* 13*  ALT 7* 6*  ALKPHOS 61 72  BILITOT 0.5 0.5  PROT 7.2 7.4  ALBUMIN 1.8* 1.9*    Recent Labs Lab 02/20/16 1340  LIPASE 24   CBC:  Recent Labs Lab 02/20/16 1340 02/24/16 1450  02/26/16 0350 02/27/16 0530  WBC 6.0 6.7  < > 6.6 5.9  NEUTROABS 5.0 4.9  --   --   --   HGB 7.5* 8.2*  < > 8.6* 9.5*  HCT 23.5* 26.5*  < > 27.9* 30.7*  MCV 86.7 87.5  < > 86.9 87.2  PLT 156 146*  < > 130* 136*  < > = values in this interval not displayed. Cardiac Enzymes:  Recent Labs Lab 02/24/16 1450 02/24/16 2150 02/25/16 0425  TROPONINI 0.06* 0.07* 0.07*   CBG:  Recent Labs Lab 02/20/16 1252 02/21/16 0733  GLUCAP  123* 69   Thyroid Function Tests:  Recent Labs Lab 02/25/16 0350  TSH 1.295   Urinalysis:  Recent Labs Lab 02/20/16 1338 02/24/16 2306  COLORURINE YELLOW STRAW*  LABSPEC 1.011 1.003*  PHURINE 6.0 5.5  GLUCOSEU NEGATIVE NEGATIVE  HGBUR MODERATE* LARGE*  BILIRUBINUR NEGATIVE NEGATIVE  KETONESUR NEGATIVE NEGATIVE  PROTEINUR 30* NEGATIVE  NITRITE NEGATIVE NEGATIVE  LEUKOCYTESUR TRACE* TRACE*   Micro Results: Recent Results (from the past 240 hour(s))  Wound culture     Status: None   Collection Time: 02/20/16  1:07 PM  Result Value Ref Range Status   Specimen Description FOOT RIGHT  Final   Special Requests FOOT LEFT  Final   Gram Stain   Final    RARE WBC PRESENT, PREDOMINANTLY PMN RARE SQUAMOUS EPITHELIAL CELLS PRESENT NO ORGANISMS SEEN Performed at [facility]    Culture   Final    FEW PSEUDOMONAS AERUGINOSA FEW METHICILLIN RESISTANT STAPHYLOCOCCUS AUREUS Note: RIFAMPIN AND GENTAMICIN SHOULD NOT BE USED AS SINGLE DRUGS FOR TREATMENT OF STAPH INFECTIONS. Performed at [facility]    Report Status 02/26/2016 FINAL  Final   Organism ID, Bacteria PSEUDOMONAS AERUGINOSA  Final   Organism ID, Bacteria METHICILLIN RESISTANT STAPHYLOCOCCUS AUREUS  Final       Susceptibility   Pseudomonas aeruginosa - MIC*    CEFEPIME <=1 SENSITIVE Sensitive     CEFTAZIDIME <=1 SENSITIVE Sensitive     CIPROFLOXACIN <=0.25 SENSITIVE Sensitive     GENTAMICIN <=1 SENSITIVE Sensitive     IMIPENEM >=16 RESISTANT Resistant     PIP/TAZO <=4 SENSITIVE Sensitive     TOBRAMYCIN <=1 SENSITIVE Sensitive     * FEW PSEUDOMONAS AERUGINOSA   Methicillin resistant staphylococcus aureus - MIC*    CLINDAMYCIN >=8 RESISTANT Resistant     ERYTHROMYCIN >=8 RESISTANT Resistant     GENTAMICIN <=0.5 SENSITIVE Sensitive     LEVOFLOXACIN 4 INTERMEDIATE Intermediate     OXACILLIN >=4 RESISTANT Resistant     RIFAMPIN <=0.5 SENSITIVE Sensitive     TRIMETH/SULFA <=10 SENSITIVE Sensitive     VANCOMYCIN <=0.5 SENSITIVE Sensitive     TETRACYCLINE <=1 SENSITIVE Sensitive     * FEW METHICILLIN RESISTANT STAPHYLOCOCCUS AUREUS  Urine culture     Status: None   Collection Time: 02/20/16  1:38 PM  Result Value Ref Range Status   Specimen Description URINE, CATHETERIZED  Final   Special Requests NONE  Final   Culture NO GROWTH 1 DAY  Final   Report Status 02/21/2016 FINAL  Final  Blood Culture (routine x 2)     Status: None   Collection Time: 02/20/16  2:13 PM  Result Value Ref Range Status   Specimen Description BLOOD RIGHT HAND  Final   Special Requests BOTTLES DRAWN AEROBIC ONLY 5 CC  Final   Culture NO GROWTH 5 DAYS  Final   Report Status 02/25/2016 FINAL  Final  Blood Culture (routine x 2)     Status: None   Collection Time: 02/20/16  2:22 PM  Result Value Ref Range Status   Specimen Description BLOOD LEFT HAND  Final   Special Requests BOTTLES DRAWN AEROBIC ONLY 5 CC  Final   Culture NO GROWTH 5 DAYS  Final   Report Status 02/25/2016 FINAL  Final  Wound culture     Status: None   Collection Time: 02/20/16  4:18 PM  Result Value Ref Range Status   Specimen Description FOOT  Final   Special Requests NONE  Final  Gram Stain   Final    RARE WBC PRESENT, PREDOMINANTLY PMN NO  SQUAMOUS EPITHELIAL CELLS SEEN RARE GRAM POSITIVE COCCI IN CLUSTERS Performed at [facility]    Culture   Final    ABUNDANT METHICILLIN RESISTANT STAPHYLOCOCCUS AUREUS Note: RIFAMPIN AND GENTAMICIN SHOULD NOT BE USED AS SINGLE DRUGS FOR TREATMENT OF STAPH INFECTIONS. Performed at [facility]    Report Status 02/23/2016 FINAL  Final   Organism ID, Bacteria METHICILLIN RESISTANT STAPHYLOCOCCUS AUREUS  Final      Susceptibility   Methicillin resistant staphylococcus aureus - MIC*    CLINDAMYCIN >=8 RESISTANT Resistant     ERYTHROMYCIN >=8 RESISTANT Resistant     GENTAMICIN <=0.5 SENSITIVE Sensitive     LEVOFLOXACIN 4 INTERMEDIATE Intermediate     OXACILLIN >=4 RESISTANT Resistant     RIFAMPIN <=0.5 SENSITIVE Sensitive     TRIMETH/SULFA <=10 SENSITIVE Sensitive     VANCOMYCIN <=0.5 SENSITIVE Sensitive     TETRACYCLINE <=1 SENSITIVE Sensitive     * ABUNDANT METHICILLIN RESISTANT STAPHYLOCOCCUS AUREUS  Blood Culture (routine x 2)     Status: None (Preliminary result)   Collection Time: 02/24/16  2:56 PM  Result Value Ref Range Status   Specimen Description BLOOD LEFT FOREARM  Final   Special Requests BOTTLES DRAWN AEROBIC AND ANAEROBIC 5CC  Final   Culture NO GROWTH 2 DAYS  Final   Report Status PENDING  Incomplete  Blood Culture (routine x 2)     Status: None (Preliminary result)   Collection Time: 02/24/16  3:40 PM  Result Value Ref Range Status   Specimen Description BLOOD LEFT HAND  Final   Special Requests BOTTLES DRAWN AEROBIC AND ANAEROBIC 5CC  Final   Culture NO GROWTH 2 DAYS  Final   Report Status PENDING  Incomplete  Urine culture     Status: None   Collection Time: 02/24/16 11:06 PM  Result Value Ref Range Status   Specimen Description URINE, CATHETERIZED  Final   Special Requests NONE  Final   Culture NO GROWTH 1 DAY  Final   Report Status 02/26/2016 FINAL  Final   Studies/Results: No results found. Medications: I have reviewed the patient's  current medications. Scheduled Meds: . antiseptic oral rinse  7 mL Mouth Rinse BID  . aspirin EC  81 mg Oral BH-q7a  . metoprolol tartrate  12.5 mg Oral BID  . pantoprazole  40 mg Oral Daily  . potassium chloride  40 mEq Oral Daily  . sodium chloride flush  10-40 mL Intracatheter Q12H  . sodium chloride flush  3 mL Intravenous Q12H   Continuous Infusions: . diltiazem (CARDIZEM) infusion Stopped (02/25/16 0906)   PRN Meds:.ondansetron **OR** ondansetron (ZOFRAN) IV, sodium chloride flush Assessment/Plan:  [name] is an 82 yo male with CKD3, AoCD, gout, and PAD, recently discharged from [facility] for treatment of multiple lower extremity ulcers, presenting with hypotension.  Hypotension and AFib with RVR, Now Paroxysmal AFib: The cause of his afib unclear, possibly 2/2 ongoing LE infection or his dehydration/hypovolemia. Now sinus rhythm at slow rate. His hypotension has resolved. TSH normal.  CHADS-VASc at least 3.Echo from 4/7 shows EF 60-65%, grade 1 diastolic dysfunction, heavily calcified AV with very mild AS. His Hgb is stable at 9.5, which is actually higher than his baseline. Will start him on Coumadin for anticoagulation given his age and poor kidney function. - Telemetry - Continue Metoprolol 12.5 mg BID - Start coumadin today  Acute on Chronic Anemia: Patient has  h/o AOCD with baseline 7.5-8.5.  On 4/7 Hgb dropped to 6.0 for unclear reason. He received 2 units PRBCs. No reported emesis or bloody BM.  He has a h/o PUD, but only uses Naproxen <2-3 times per week.  His Hgb is now stable at 9.5.  - Start Coumadin  - CBC, INR in AM  AKI on CKD3, resolved: Cr elevated to 2.22 (baseline ~1.8-1.9) on admission. Given his hypotension, most likely etiology is pre-renal.His Cr improved to 1.89 this morning which is near his baseline. - Will need bmet at outpatient follow up  Hypokalemia and Hypernatremia: Na 150 and K 3.0 on admission.Today his Na is 148 and K 4.3. Both possibly  explained by GI losses 2/2 vomiting. Also possible he is having insensible losses from drainage of leg wounds.  - Continue to monitor  LE Ulcers, stable: Patient completed 2 weeks of Ertapenem IV, but last cultures grew MRSA and Pseudomonas resistant to Imipenem. As sterilization of wounds is impossible and no signs of worsening infection, will hold antibiotics and plan for outpatient follow up. - HOLD antibiotics - Wound care  FEN/GI: - Regular  DVT Ppx: heparin, transitioning to coumadin  Dispo: Disposition is deferred at this time, awaiting improvement of current medical problems. Discharge likely tomorrow.   The patient does have a current PCP (No Pcp Per Patient) and [facility].  The patient does not have transportation limitations that hinder transportation to clinic appointments.  .Services Needed at time of discharge: Y = Yes, Blank = No PT:   OT:   RN:   Equipment:   Other:     LOS: 3 days   Su Hoffarly J Tiffiany Beadles, MD 02/27/2016, 12:07 PM

## 2016-02-28 ENCOUNTER — Telehealth: Payer: Self-pay | Admitting: Internal Medicine

## 2016-02-28 ENCOUNTER — Telehealth: Payer: Self-pay | Admitting: General Practice

## 2016-02-28 DIAGNOSIS — D649 Anemia, unspecified: Secondary | ICD-10-CM

## 2016-02-28 LAB — PROTIME-INR
INR: 1.15 (ref 0.00–1.49)
PROTHROMBIN TIME: 14.9 s (ref 11.6–15.2)

## 2016-02-28 MED ORDER — FUROSEMIDE 40 MG PO TABS: 40.0000 mg | ORAL_TABLET | Freq: Two times a day (BID) | ORAL | Status: DC

## 2016-02-28 MED ORDER — WARFARIN SODIUM 5 MG PO TABS: 5.0000 mg | ORAL_TABLET | Freq: Every day | ORAL | Status: DC

## 2016-02-28 MED ORDER — METOPROLOL TARTRATE 25 MG PO TABS: 12.5000 mg | ORAL_TABLET | Freq: Two times a day (BID) | ORAL | Status: DC

## 2016-02-28 MED ORDER — POTASSIUM CHLORIDE CRYS ER 20 MEQ PO TBCR: 40.0000 meq | EXTENDED_RELEASE_TABLET | Freq: Every day | ORAL | Status: DC

## 2016-02-28 MED ORDER — PANTOPRAZOLE SODIUM 40 MG PO TBEC: 40.0000 mg | DELAYED_RELEASE_TABLET | Freq: Every day | ORAL | Status: DC

## 2016-02-28 NOTE — Telephone Encounter (Signed)
Needs TOC appt 4/12 with dr gill

## 2016-02-28 NOTE — Progress Notes (Signed)
Subjective: No acute events overnight. He denies chest pain, SOB, dizziness, or lightheadedness.  Objective: Vital signs in last 24 hours: Filed Vitals:   02/28/16 0200 02/28/16 0400 02/28/16 0405 02/28/16 0522  BP: 161/88 202/85 188/84 174/90  Pulse: 57 68 58 53  Temp:  97.8 F (36.6 C)    TempSrc:  Oral    Resp: 16 20 18 16   Height:      Weight:      SpO2: 95% 87% 93% 96%   Weight change:   Intake/Output Summary (Last 24 hours) at 02/28/16 0824 Last data filed at 02/27/16 1900  Gross per 24 hour  Intake    960 ml  Output    725 ml  Net    235 ml   Physical Exam  Constitutional: He is oriented to person, place, and time.  Elderly, chronically ill appearing male, lying in bed, NAD  HENT:  Head: Normocephalic and atraumatic.  Eyes: EOM are normal. No scleral icterus.  Neck: No tracheal deviation present.  Cardiovascular: Normal rate, regular rhythm and normal heart sounds.   Pulmonary/Chest: No stridor. He has no wheezes. He has no rhonchi. He has no rales.  Abdominal: Soft. He exhibits no distension. There is no tenderness. There is no rebound and no guarding.  Musculoskeletal: He exhibits no edema.  Neurological: He is alert and oriented to person, place, and time.  Skin: Skin is warm and dry.  Chronic ulcers on lower extremities bandaged.    Lab Results: Basic Metabolic Panel:  Recent Labs Lab 02/24/16 1703  02/26/16 0350 02/27/16 0530  NA  --   < > 149* 148*  K  --   < > 4.2 4.3  CL  --   < > 116* 114*  CO2  --   < > 26 26  GLUCOSE  --   < > 75 82  BUN  --   < > 16 14  CREATININE  --   < > 1.93* 1.89*  CALCIUM  --   < > 8.6* 9.4  MG 1.7  --   --   --   < > = values in this interval not displayed. Liver Function Tests:  Recent Labs Lab 02/24/16 1450  AST 13*  ALT 6*  ALKPHOS 72  BILITOT 0.5  PROT 7.4  ALBUMIN 1.9*   No results for input(s): LIPASE, AMYLASE in the last 168 hours. CBC:  Recent Labs Lab 02/24/16 1450  02/26/16 0350  02/27/16 0530  WBC 6.7  < > 6.6 5.9  NEUTROABS 4.9  --   --   --   HGB 8.2*  < > 8.6* 9.5*  HCT 26.5*  < > 27.9* 30.7*  MCV 87.5  < > 86.9 87.2  PLT 146*  < > 130* 136*  < > = values in this interval not displayed. Cardiac Enzymes:  Recent Labs Lab 02/24/16 1450 02/24/16 2150 02/25/16 0425  TROPONINI 0.06* 0.07* 0.07*   CBG: No results for input(s): GLUCAP in the last 168 hours. Thyroid Function Tests:  Recent Labs Lab 02/25/16 0350  TSH 1.295   Urinalysis:  Recent Labs Lab 02/24/16 2306  COLORURINE STRAW*  LABSPEC 1.003*  PHURINE 5.5  GLUCOSEU NEGATIVE  HGBUR LARGE*  BILIRUBINUR NEGATIVE  KETONESUR NEGATIVE  PROTEINUR NEGATIVE  NITRITE NEGATIVE  LEUKOCYTESUR TRACE*   Micro Results: Recent Results (from the past 240 hour(s))  Wound culture     Status: None   Collection Time: 02/20/16  1:07 PM  Result  Value Ref Range Status   Specimen Description FOOT RIGHT  Final   Special Requests FOOT LEFT  Final   Gram Stain   Final    RARE WBC PRESENT, PREDOMINANTLY PMN RARE SQUAMOUS EPITHELIAL CELLS PRESENT NO ORGANISMS SEEN Performed at [facility]    Culture   Final    FEW PSEUDOMONAS AERUGINOSA FEW METHICILLIN RESISTANT STAPHYLOCOCCUS AUREUS Note: RIFAMPIN AND GENTAMICIN SHOULD NOT BE USED AS SINGLE DRUGS FOR TREATMENT OF STAPH INFECTIONS. Performed at [facility]    Report Status 02/26/2016 FINAL  Final   Organism ID, Bacteria PSEUDOMONAS AERUGINOSA  Final   Organism ID, Bacteria METHICILLIN RESISTANT STAPHYLOCOCCUS AUREUS  Final      Susceptibility   Pseudomonas aeruginosa - MIC*    CEFEPIME <=1 SENSITIVE Sensitive     CEFTAZIDIME <=1 SENSITIVE Sensitive     CIPROFLOXACIN <=0.25 SENSITIVE Sensitive     GENTAMICIN <=1 SENSITIVE Sensitive     IMIPENEM >=16 RESISTANT Resistant     PIP/TAZO <=4 SENSITIVE Sensitive     TOBRAMYCIN <=1 SENSITIVE Sensitive     * FEW PSEUDOMONAS AERUGINOSA   Methicillin resistant staphylococcus aureus -  MIC*    CLINDAMYCIN >=8 RESISTANT Resistant     ERYTHROMYCIN >=8 RESISTANT Resistant     GENTAMICIN <=0.5 SENSITIVE Sensitive     LEVOFLOXACIN 4 INTERMEDIATE Intermediate     OXACILLIN >=4 RESISTANT Resistant     RIFAMPIN <=0.5 SENSITIVE Sensitive     TRIMETH/SULFA <=10 SENSITIVE Sensitive     VANCOMYCIN <=0.5 SENSITIVE Sensitive     TETRACYCLINE <=1 SENSITIVE Sensitive     * FEW METHICILLIN RESISTANT STAPHYLOCOCCUS AUREUS  Urine culture     Status: None   Collection Time: 02/20/16  1:38 PM  Result Value Ref Range Status   Specimen Description URINE, CATHETERIZED  Final   Special Requests NONE  Final   Culture NO GROWTH 1 DAY  Final   Report Status 02/21/2016 FINAL  Final  Blood Culture (routine x 2)     Status: None   Collection Time: 02/20/16  2:13 PM  Result Value Ref Range Status   Specimen Description BLOOD RIGHT HAND  Final   Special Requests BOTTLES DRAWN AEROBIC ONLY 5 CC  Final   Culture NO GROWTH 5 DAYS  Final   Report Status 02/25/2016 FINAL  Final  Blood Culture (routine x 2)     Status: None   Collection Time: 02/20/16  2:22 PM  Result Value Ref Range Status   Specimen Description BLOOD LEFT HAND  Final   Special Requests BOTTLES DRAWN AEROBIC ONLY 5 CC  Final   Culture NO GROWTH 5 DAYS  Final   Report Status 02/25/2016 FINAL  Final  Wound culture     Status: None   Collection Time: 02/20/16  4:18 PM  Result Value Ref Range Status   Specimen Description FOOT  Final   Special Requests NONE  Final   Gram Stain   Final    RARE WBC PRESENT, PREDOMINANTLY PMN NO SQUAMOUS EPITHELIAL CELLS SEEN RARE GRAM POSITIVE COCCI IN CLUSTERS Performed at [facility]    Culture   Final    ABUNDANT METHICILLIN RESISTANT STAPHYLOCOCCUS AUREUS Note: RIFAMPIN AND GENTAMICIN SHOULD NOT BE USED AS SINGLE DRUGS FOR TREATMENT OF STAPH INFECTIONS. Performed at [facility]    Report Status 02/23/2016 FINAL  Final   Organism ID, Bacteria METHICILLIN RESISTANT  STAPHYLOCOCCUS AUREUS  Final      Susceptibility   Methicillin resistant staphylococcus  aureus - MIC*    CLINDAMYCIN >=8 RESISTANT Resistant     ERYTHROMYCIN >=8 RESISTANT Resistant     GENTAMICIN <=0.5 SENSITIVE Sensitive     LEVOFLOXACIN 4 INTERMEDIATE Intermediate     OXACILLIN >=4 RESISTANT Resistant     RIFAMPIN <=0.5 SENSITIVE Sensitive     TRIMETH/SULFA <=10 SENSITIVE Sensitive     VANCOMYCIN <=0.5 SENSITIVE Sensitive     TETRACYCLINE <=1 SENSITIVE Sensitive     * ABUNDANT METHICILLIN RESISTANT STAPHYLOCOCCUS AUREUS  Blood Culture (routine x 2)     Status: None (Preliminary result)   Collection Time: 02/24/16  2:56 PM  Result Value Ref Range Status   Specimen Description BLOOD LEFT FOREARM  Final   Special Requests BOTTLES DRAWN AEROBIC AND ANAEROBIC 5CC  Final   Culture NO GROWTH 3 DAYS  Final   Report Status PENDING  Incomplete  Blood Culture (routine x 2)     Status: None (Preliminary result)   Collection Time: 02/24/16  3:40 PM  Result Value Ref Range Status   Specimen Description BLOOD LEFT HAND  Final   Special Requests BOTTLES DRAWN AEROBIC AND ANAEROBIC 5CC  Final   Culture NO GROWTH 3 DAYS  Final   Report Status PENDING  Incomplete  Urine culture     Status: None   Collection Time: 02/24/16 11:06 PM  Result Value Ref Range Status   Specimen Description URINE, CATHETERIZED  Final   Special Requests NONE  Final   Culture NO GROWTH 1 DAY  Final   Report Status 02/26/2016 FINAL  Final   Studies/Results: No results found. Medications: I have reviewed the patient's current medications. Scheduled Meds: . antiseptic oral rinse  7 mL Mouth Rinse BID  . aspirin EC  81 mg Oral BH-q7a  . metoprolol tartrate  12.5 mg Oral BID  . pantoprazole  40 mg Oral Daily  . potassium chloride  40 mEq Oral Daily  . sodium chloride flush  10-40 mL Intracatheter Q12H  . sodium chloride flush  3 mL Intravenous Q12H  . Warfarin - Pharmacist Dosing Inpatient   Does not apply q1800    Continuous Infusions:   PRN Meds:.ondansetron **OR** ondansetron (ZOFRAN) IV, sodium chloride flush Assessment/Plan:  [name] is an 82 yo male with CKD3, AoCD, gout, and PAD, [facility] of multiple lower extremity ulcers, presenting with hypotension.  Hypotension and AFib with RVR, Now Paroxysmal AFib: The cause of his afib unclear, possibly 2/2 ongoing LE infection or his dehydration/hypovolemia. Now sinus rhythm at slow rate. His hypotension has resolved. TSH normal.  CHADS-VASc is 3.Echo from 4/7 shows EF 60-65%, grade 1 diastolic dysfunction, heavily calcified AV with very mild AS. His Hgb is stable at 9.5, which is actually higher than his baseline. Will start him on Coumadin for anticoagulation given his age and poor kidney function. - Telemetry - Continue Metoprolol 12.5 mg BID - Continue Warfarin as outpatient  Acute on Chronic Anemia, improved: Patient has h/o AOCD with baseline 7.5-8.5.  On 4/7 Hgb dropped to 6.0 for unclear reason. He received 2 units PRBCs. No reported emesis or bloody BM.  He has a h/o PUD, but only uses Naproxen <2-3 times per week.   AKI on CKD3, resolved: Cr elevated to 2.22 (baseline ~1.8-1.9) on admission. Given his hypotension, most likely etiology is pre-renal and improved. - Will need bmet at outpatient follow up  Hypokalemia and Hypernatremia: Na 150 and K 3.0 on admission.Today his Na is 148 and K 4.3.  Both possibly explained by GI losses 2/2 vomiting. Also possible he is having insensible losses from drainage of leg wounds.  - Continue to monitor  LE Ulcers, stable: Patient completed 2 weeks of Ertapenem IV, but last cultures grew MRSA and Pseudomonas resistant to Imipenem. As sterilization of wounds is impossible and no signs of worsening infection, will hold antibiotics and plan for outpatient follow up. - HOLD antibiotics - Wound care  FEN/GI: - Regular  DVT Ppx: heparin, transitioning to  coumadin  Dispo: Disposition is deferred at this time, awaiting improvement of current medical problems. Discharge likely tomorrow.   The patient does have a current PCP (No Pcp Per Patient) and does need an [facility] follow-up appointment after discharge.  The patient does not have transportation limitations that hinder transportation to clinic appointments.  .Services Needed at time of discharge: Y = Yes, Blank = No PT:   OT:   RN:   Equipment:   Other:     LOS: 4 days   Jana Half, MD 02/28/2016, 8:24 AM

## 2016-02-28 NOTE — Progress Notes (Signed)
Order to discharge verified. Iv access removed. Picc removed by IV Ream RN. Prior foley removed by this RN but wife upset and stated foley needs to be left in place till he follows up with MD. New foley placed (14Fr.) under sterile technique. Discharge instructions given to pt's wife. Pt's wife Okey Regal( Carol) verbalized understanding. CCMD and Elink both notified of discharge. VSS prior to sending pt. home. Pt transported via w/c with belongings.

## 2016-02-28 NOTE — Progress Notes (Signed)
Alerted [facility] rep of pt for disch today. They will resume hhc services.

## 2016-02-28 NOTE — Telephone Encounter (Signed)
Still in-house at this time...

## 2016-02-28 NOTE — Care Management Note (Signed)
Case Management Note  Patient Details  Name: Vincent PigeonRobert T Miranda MRN: [id number] Date of Birth: 05/26/1925  Subjective/Objective:        Adm w bradycardia            Action/Plan: lives w fam, act w [facility]  Expected Discharge Date:                  Expected Discharge Plan:  Home w Home Health Services  In-House Referral:     Discharge planning Services  CM Consult  Post Acute Care Choice:  Resumption of Svcs/PTA Provider Choice offered to:     DME Arranged:    DME Agency:     HH Arranged:  RN, PT, OT HH Agency:  [facility]  Status of Service:  Completed, signed off  Medicare Important Message Given:    Date Medicare IM Given:    Medicare IM give by:    Date Additional Medicare IM Given:    Additional Medicare Important Message give by:     If discussed at Long Length of Stay Meetings, dates discussed:    Additional Comments: spoke w pt and fam. Pt plans to return home and fam states they will help. Have alerted [facility] of disch.  [name], Terrell Shimko T, RN 02/28/2016, 1:13 PM

## 2016-02-28 NOTE — Care Management Important Message (Signed)
Important Message  Patient Details  Name: Vincent PigeonRobert T Padron MRN: [id number] Date of Birth: Jan 29, 1925   Medicare Important Message Given:  Yes    Hanley HaysDowell, Zaxton Angerer T, RN 02/28/2016, 1:41 PM

## 2016-02-28 NOTE — Telephone Encounter (Signed)
APPT. REMINDER CALL, LMTCB °

## 2016-02-28 NOTE — Progress Notes (Signed)
Pt's BP elevated. Spoke with teaching services. Decision is made to not take action at this time, but to call again if BP continues to be above 180 systolic. Will continue to monitor.

## 2016-02-28 NOTE — Discharge Instructions (Signed)
1. Take Warfarin 5 mg daily until you follow up in clinic on Wednesday 4/12. 2. Restart Lasix 40 mg TWICE daily. 3. Take Potassium 40 mEq (2 tabs) once daily until follow up in clinic.  Vitamin K Foods and Warfarin Warfarin is a medicine that helps prevent harmful blood clots by causing blood to clot more slowly. It does this by decreasing the activity of vitamin K, which promotes normal blood clotting. For the dose of warfarin you have been prescribed to work well, you need to get about the same amount of vitamin K from your food from day to day. Suddenly getting a lot more vitamin K could cause your blood to clot too quickly. A sudden decrease in vitamin K intake could cause your blood to clot too slowly. These changes in vitamin K intake could lead to dangerous blood clotsor to bleeding. WHAT GENERAL GUIDELINES DO I NEED TO FOLLOW?  Keep your intake of vitamin K consistent from day to day. To do this, you must be aware of which foods contain moderate or high amounts of vitamin K. Listed below are some foods that are very high, high, or moderately high in vitamin K. If you eat these foods, make sure you eat a consistent amount of them from day to day.  Avoid major changes in your diet, or tell your health care provider before changing your diet.  If you take a multivitamin that contains vitamin K, be sure to take it every day.  If you drink green tea, drink the same amount each day. WHAT FOODS ARE VERY HIGH IN VITAMIN K?   Greens, such as Swiss chard and beet, collard, mustard, or turnip greens (fresh or frozen, cooked).  Kale (fresh or frozen, cooked).   Parsley (raw).  Spinach (cooked).  WHAT FOODS ARE HIGH IN VITAMIN K?  Asparagus (frozen, cooked).  Broccoli.   Bok choy (cooked).   Brussels sprouts (fresh or frozen, cooked).  Cabbage (cooked).  Coleslaw. WHAT FOODS ARE MODERATELY HIGH IN VITAMIN K?  Blueberries.  Black-eyed peas.  Endive (raw).   Green leaf  lettuce (raw).   Green scallions (raw).  Kale (raw).  Okra (frozen, cooked).  Plantains (fried).  Romaine lettuce (raw).   Sauerkraut (canned).   Spinach (raw).   This information is not intended to replace advice given to you by your health care provider. Make sure you discuss any questions you have with your health care provider.   Document Released: 09/03/2009 Document Revised: 11/27/2014 Document Reviewed: 09/10/2013 Elsevier Interactive Patient Education [employer]2016 Elsevier Inc.

## 2016-02-28 NOTE — Progress Notes (Signed)
ANTICOAGULATION CONSULT NOTE - Follow Up Consult  Pharmacy Consult for warfarin Indication: atrial fibrillation  No Known Allergies  Patient Measurements: Height: 5\' 11"  (180.3 cm) Weight: 160 lb 0.9 oz (72.6 kg) IBW/kg (Calculated) : 75.3 Heparin Dosing Weight: 70 kg   Vital Signs: Temp: 98.1 F (36.7 C) (04/10 0829) Temp Source: Oral (04/10 0829) BP: 188/84 mmHg (04/10 0829) Pulse Rate: 56 (04/10 0829)  Labs:  Recent Labs  02/25/16 1730 02/26/16 0350 02/27/16 0530 02/28/16 0430  HGB 8.3* 8.6* 9.5*  --   HCT 27.0* 27.9* 30.7*  --   PLT 119* 130* 136*  --   LABPROT  --   --   --  14.9  INR  --   --   --  1.15  CREATININE  --  1.93* 1.89*  --     Estimated Creatinine Clearance: 27.2 mL/min (by C-G formula based on Cr of 1.89).  Assessment: [age] YOM with new Afib started on IV heparin.  Patient was not on any anticoagulation prior to admission.  Heparin was stopped 4/7 with low h/h and plan discussed for oral AC.    Hgb appeared stable with cbc yesterday, none this am. INR 1.1 this am, no baseline prior to warfarin dose last night.   D/w Dr.Taylor with Internal Medicine, would recommend discharge on 5mg  daily given wt of 70kg and no interacting medications. Also recommended close follow up with INR check in clinic on Wednesday of this week to assess as clinic is closed the latter part of the week d/t the holiday.  CHADS-VASc at least 3 given age, HFpEF  Goal of Therapy:  INR 2-3 Monitor platelets by anticoagulation protocol: Yes   Plan:  Warfarin 5mg  daily until seen in clinic Wednesday Daily Protime  Sheppard CoilFrank Wilson PharmD., BCPS Clinical Pharmacist Pager [phone] 02/28/2016 8:38 AM

## 2016-02-28 NOTE — Progress Notes (Signed)
  Date: 02/28/2016  Patient name: Vincent Miranda  Medical record number: [id number]  Date of birth: 1925-01-07   This patient has been seen and the plan of care was discussed with the house staff. Please see their note for complete details. I concur with their findings with the following additions/corrections: Mr Vincent Miranda was feeling well this AM. No more episodes like the one that made him come to hospital. Now rate controlled on BB. Warfarin for A Fib. Stable for D/C home with close F/U.  Burns SpainElizabeth A Butcher, MD 02/28/2016, 2:23 PM

## 2016-02-29 LAB — CULTURE, BLOOD (ROUTINE X 2)
CULTURE: NO GROWTH
Culture: NO GROWTH

## 2016-03-01 ENCOUNTER — Encounter: Payer: Self-pay | Admitting: Internal Medicine

## 2016-03-01 ENCOUNTER — Ambulatory Visit (INDEPENDENT_AMBULATORY_CARE_PROVIDER_SITE_OTHER): Payer: Medicare Other | Admitting: Internal Medicine

## 2016-03-01 VITALS — BP 147/69 | HR 78 | Temp 98.2°F | Ht 71.0 in | Wt 159.3 lb

## 2016-03-01 DIAGNOSIS — I480 Paroxysmal atrial fibrillation: Secondary | ICD-10-CM

## 2016-03-01 DIAGNOSIS — T8754 Necrosis of amputation stump, left lower extremity: Secondary | ICD-10-CM | POA: Diagnosis not present

## 2016-03-01 DIAGNOSIS — I9589 Other hypotension: Secondary | ICD-10-CM

## 2016-03-01 DIAGNOSIS — T8753 Necrosis of amputation stump, right lower extremity: Secondary | ICD-10-CM | POA: Diagnosis not present

## 2016-03-01 DIAGNOSIS — I959 Hypotension, unspecified: Secondary | ICD-10-CM | POA: Diagnosis present

## 2016-03-01 DIAGNOSIS — R339 Retention of urine, unspecified: Secondary | ICD-10-CM

## 2016-03-01 DIAGNOSIS — A419 Sepsis, unspecified organism: Secondary | ICD-10-CM | POA: Diagnosis not present

## 2016-03-01 DIAGNOSIS — Z7982 Long term (current) use of aspirin: Secondary | ICD-10-CM | POA: Diagnosis not present

## 2016-03-01 DIAGNOSIS — M86472 Chronic osteomyelitis with draining sinus, left ankle and foot: Secondary | ICD-10-CM | POA: Diagnosis not present

## 2016-03-01 DIAGNOSIS — Z8679 Personal history of other diseases of the circulatory system: Secondary | ICD-10-CM

## 2016-03-01 DIAGNOSIS — Z09 Encounter for follow-up examination after completed treatment for conditions other than malignant neoplasm: Secondary | ICD-10-CM | POA: Diagnosis not present

## 2016-03-01 DIAGNOSIS — I10 Essential (primary) hypertension: Secondary | ICD-10-CM

## 2016-03-01 DIAGNOSIS — R338 Other retention of urine: Secondary | ICD-10-CM

## 2016-03-01 DIAGNOSIS — L03116 Cellulitis of left lower limb: Secondary | ICD-10-CM | POA: Diagnosis not present

## 2016-03-01 DIAGNOSIS — Z960 Presence of urogenital implants: Secondary | ICD-10-CM

## 2016-03-01 DIAGNOSIS — I70263 Atherosclerosis of native arteries of extremities with gangrene, bilateral legs: Secondary | ICD-10-CM | POA: Diagnosis not present

## 2016-03-01 NOTE — Progress Notes (Signed)
Patient ID: Vincent Miranda, male   DOB: 14-Nov-1925, 82 y.o.   MRN: [id number]     Subjective:   Patient ID: Vincent Miranda male    DOB: [birth year] 82 y.o.    MRN: [id number] Health Maintenance Due: Health Maintenance Due  Topic Date Due  . URINE MICROALBUMIN  01/17/1937  . TETANUS/TDAP  01/17/1946  . ZOSTAVAX  01/17/1987  . PNA vac Low Risk Adult (1 of 2 - PCV13) 01/18/1992    _________________________________________________  HPI: [name] is a 82 y.o. male here for hospital f/u.  Pt has a PMH outlined below.  Please see problem-based charting assessment and plan for further status of patient's chronic medical problems addressed at today's visit.  PMH: Past Medical History  Diagnosis Date  . Hypertension   . Chronic kidney disease   . Anemia of chronic disease   . Gout   . Acute blood loss anemia 10/18/2013  . History of blood transfusion     Medications: Current Outpatient Prescriptions on File Prior to Visit  Medication Sig Dispense Refill  . alfuzosin (UROXATRAL) 10 MG 24 hr tablet Take 1 tablet (10 mg total) by mouth daily with breakfast. 30 tablet 1  . allopurinol (ZYLOPRIM) 100 MG tablet Take 1 tablet (100 mg total) by mouth 2 (two) times daily before a meal. (Patient taking differently: Take 100 mg by mouth daily. ) 60 tablet 0  . aspirin EC 81 MG tablet Take 81 mg by mouth daily.     . furosemide (LASIX) 40 MG tablet Take 1 tablet (40 mg total) by mouth 2 (two) times daily. 60 tablet 0  . metoprolol tartrate (LOPRESSOR) 25 MG tablet Take 0.5 tablets (12.5 mg total) by mouth 2 (two) times daily. 30 tablet 3  . pantoprazole (PROTONIX) 40 MG tablet Take 1 tablet (40 mg total) by mouth daily. 30 tablet 3  . potassium chloride SA (K-DUR,KLOR-CON) 20 MEQ tablet Take 2 tablets (40 mEq total) by mouth daily. 30 tablet 0  . warfarin (COUMADIN) 5 MG tablet Take 1 tablet (5 mg total) by mouth daily. 30 tablet 0  . Zinc 50 MG TABS Take 50 mg by mouth  daily.     No current facility-administered medications on file prior to visit.    Allergies: No Known Allergies  FH: No family history on file.  SH: Social History   Social History  . Marital Status: Married    Spouse Name: N/A  . Number of Children: N/A  . Years of Education: N/A   Social History Main Topics  . Smoking status: Former Smoker -- 1.00 packs/day for 43 years    Types: Cigarettes  . Smokeless tobacco: Never Used     Comment: "quit smoking cigarettes in the 1980s"  . Alcohol Use: Yes     Comment: "nothing anymore" (02/03/2016)  . Drug Use: No  . Sexual Activity: Not Currently   Other Topics Concern  . None   Social History Narrative    Review of Systems: Constitutional: Negative for fever, chills.  Respiratory: Negative for shortness of breath.  Cardiovascular: Negative for chest pain.  Gastrointestinal: Negative for nausea, vomiting. Neurological: Negative for dizziness.   Objective:   Vital Signs: Filed Vitals:   03/01/16 1530  BP: 147/69  Pulse: 78  Temp: 98.2 F (36.8 C)  TempSrc: Oral  Height:  (1.803 m)  Weight: 159 lb 4.8 oz (72.258 kg)  SpO2: 98%     BP Readings from Last 3 Encounters:  03/01/16 147/69  02/28/16 148/72  02/24/16 76/58    Physical Exam: Constitutional: Vital signs reviewed.  Patient is in NAD and cooperative with exam.  Head: Normocephalic and atraumatic. Eyes: EOMI, conjunctivae nl, no scleral icterus.  Neck: Supple. Cardiovascular: RRR. Pulmonary/Chest: Normal effort, CTAB, no wheezes, rales, or rhonchi. Abdominal: Soft. NT/ND +BS. Neurological: A&O x3, cranial nerves II-XII are grossly intact, moving all extremities. Extremities: 1+ LE pitting edema b/l.   Skin: Warm, dry and intact.    Assessment & Plan:   Assessment and plan was discussed and formulated with my attending.

## 2016-03-01 NOTE — Assessment & Plan Note (Addendum)
Pt has urinary rentention and has a foley catheter in place that she reports was changed in the hospital.  Pt has f/u with urology in a few weeks.  He is on an alpha blocker.  We discussed that the catheter also puts him at risk for infection.   -f/u with urology  -monitor foley catheter

## 2016-03-01 NOTE — Patient Instructions (Signed)
Thank you for your visit today.   Please return to the internal medicine clinic in about 3 weeks.       Continue the current medications for now.  I will check your potassium today.    Please be sure to bring all of your medications with you to every visit; this includes herbal supplements, vitamins, eye drops, and any over-the-counter medications.   Should you have any questions regarding your medications and/or any new or worsening symptoms, please be sure to call the clinic at [phone].   If you believe that you are suffering from a life threatening condition or one that may result in the loss of limb or function, then you should call 911 and proceed to the nearest Emergency Department.   A healthy lifestyle and preventative care can promote health and wellness.   Maintain regular health, dental, and eye exams.  Eat a healthy diet. Foods like vegetables, fruits, whole grains, low-fat dairy products, and lean protein foods contain the nutrients you need without too many calories. Decrease your intake of foods high in solid fats, added sugars, and salt. Get information about a proper diet from your caregiver, if necessary.  Regular physical exercise is one of the most important things you can do for your health. Most adults should get at least 150 minutes of moderate-intensity exercise (any activity that increases your heart rate and causes you to sweat) each week. In addition, most adults need muscle-strengthening exercises on 2 or more days a week.   Maintain a healthy weight. The body mass index (BMI) is a screening tool to identify possible weight problems. It provides an estimate of body fat based on height and weight. Your caregiver can help determine your BMI, and can help you achieve or maintain a healthy weight. For adults 20 years and older:  A BMI below 18.5 is considered underweight.  A BMI of 18.5 to 24.9 is normal.  A BMI of 25 to 29.9 is considered overweight.  A  BMI of 30 and above is considered obese.

## 2016-03-01 NOTE — Assessment & Plan Note (Addendum)
Pt reports feeling well since being out of the hospital.  No chest pain, dizziness.  BP today 147/69.  He has not been taking metoprolol per wife's request.  -check BMP  -d/c metoprolol  -f/u in about 3-4 weeks

## 2016-03-01 NOTE — Assessment & Plan Note (Addendum)
His rate is regular today.  Denies chest pain.  Wife does not want him to be on warfarin so this has not been started.  She does not want him to be on "blood thinners."  She reports he has taken ASA "for years." -cont ASA 81mg 

## 2016-03-02 LAB — BMP8+ANION GAP
Anion Gap: 13 mmol/L (ref 10.0–18.0)
BUN/Creatinine Ratio: 8 — ABNORMAL LOW (ref 10–24)
BUN: 14 mg/dL (ref 8–27)
CHLORIDE: 108 mmol/L — AB (ref 96–106)
CO2: 25 mmol/L (ref 18–29)
CREATININE: 1.85 mg/dL — AB (ref 0.76–1.27)
Calcium: 9.7 mg/dL (ref 8.6–10.2)
GFR calc non Af Amer: 32 mL/min/{1.73_m2} — ABNORMAL LOW (ref >59)
GFR, EST AFRICAN AMERICAN: 37 mL/min/{1.73_m2} — AB (ref >59)
GLUCOSE: 108 mg/dL — AB (ref 65–99)
Potassium: 4.2 mmol/L (ref 3.5–5.2)
Sodium: 146 mmol/L — ABNORMAL HIGH (ref 134–144)

## 2016-03-03 DIAGNOSIS — A419 Sepsis, unspecified organism: Secondary | ICD-10-CM | POA: Diagnosis not present

## 2016-03-03 DIAGNOSIS — T8754 Necrosis of amputation stump, left lower extremity: Secondary | ICD-10-CM | POA: Diagnosis not present

## 2016-03-03 DIAGNOSIS — L03116 Cellulitis of left lower limb: Secondary | ICD-10-CM | POA: Diagnosis not present

## 2016-03-03 DIAGNOSIS — I70263 Atherosclerosis of native arteries of extremities with gangrene, bilateral legs: Secondary | ICD-10-CM | POA: Diagnosis not present

## 2016-03-03 DIAGNOSIS — T8753 Necrosis of amputation stump, right lower extremity: Secondary | ICD-10-CM | POA: Diagnosis not present

## 2016-03-03 DIAGNOSIS — M86472 Chronic osteomyelitis with draining sinus, left ankle and foot: Secondary | ICD-10-CM | POA: Diagnosis not present

## 2016-03-07 DIAGNOSIS — T8753 Necrosis of amputation stump, right lower extremity: Secondary | ICD-10-CM | POA: Diagnosis not present

## 2016-03-07 DIAGNOSIS — L03116 Cellulitis of left lower limb: Secondary | ICD-10-CM | POA: Diagnosis not present

## 2016-03-07 DIAGNOSIS — I70263 Atherosclerosis of native arteries of extremities with gangrene, bilateral legs: Secondary | ICD-10-CM | POA: Diagnosis not present

## 2016-03-07 DIAGNOSIS — M86472 Chronic osteomyelitis with draining sinus, left ankle and foot: Secondary | ICD-10-CM | POA: Diagnosis not present

## 2016-03-07 DIAGNOSIS — T8754 Necrosis of amputation stump, left lower extremity: Secondary | ICD-10-CM | POA: Diagnosis not present

## 2016-03-07 DIAGNOSIS — A419 Sepsis, unspecified organism: Secondary | ICD-10-CM | POA: Diagnosis not present

## 2016-03-09 DIAGNOSIS — T8754 Necrosis of amputation stump, left lower extremity: Secondary | ICD-10-CM | POA: Diagnosis not present

## 2016-03-09 DIAGNOSIS — A419 Sepsis, unspecified organism: Secondary | ICD-10-CM | POA: Diagnosis not present

## 2016-03-09 DIAGNOSIS — L03116 Cellulitis of left lower limb: Secondary | ICD-10-CM | POA: Diagnosis not present

## 2016-03-09 DIAGNOSIS — T8753 Necrosis of amputation stump, right lower extremity: Secondary | ICD-10-CM | POA: Diagnosis not present

## 2016-03-09 DIAGNOSIS — M86472 Chronic osteomyelitis with draining sinus, left ankle and foot: Secondary | ICD-10-CM | POA: Diagnosis not present

## 2016-03-09 DIAGNOSIS — I70263 Atherosclerosis of native arteries of extremities with gangrene, bilateral legs: Secondary | ICD-10-CM | POA: Diagnosis not present

## 2016-03-12 ENCOUNTER — Other Ambulatory Visit: Payer: Self-pay | Admitting: Internal Medicine

## 2016-03-14 DIAGNOSIS — T8753 Necrosis of amputation stump, right lower extremity: Secondary | ICD-10-CM | POA: Diagnosis not present

## 2016-03-14 DIAGNOSIS — I70263 Atherosclerosis of native arteries of extremities with gangrene, bilateral legs: Secondary | ICD-10-CM | POA: Diagnosis not present

## 2016-03-14 DIAGNOSIS — L03116 Cellulitis of left lower limb: Secondary | ICD-10-CM | POA: Diagnosis not present

## 2016-03-14 DIAGNOSIS — M86472 Chronic osteomyelitis with draining sinus, left ankle and foot: Secondary | ICD-10-CM | POA: Diagnosis not present

## 2016-03-14 DIAGNOSIS — T8754 Necrosis of amputation stump, left lower extremity: Secondary | ICD-10-CM | POA: Diagnosis not present

## 2016-03-14 DIAGNOSIS — A419 Sepsis, unspecified organism: Secondary | ICD-10-CM | POA: Diagnosis not present

## 2016-03-15 NOTE — Progress Notes (Signed)
Internal Medicine Clinic Attending  Case discussed with Dr. Gill soon after the resident saw the patient.  We reviewed the resident's history and exam and pertinent patient test results.  I agree with the assessment, diagnosis, and plan of care documented in the resident's note.  

## 2016-03-16 DIAGNOSIS — L03116 Cellulitis of left lower limb: Secondary | ICD-10-CM | POA: Diagnosis not present

## 2016-03-16 DIAGNOSIS — T8754 Necrosis of amputation stump, left lower extremity: Secondary | ICD-10-CM | POA: Diagnosis not present

## 2016-03-16 DIAGNOSIS — A419 Sepsis, unspecified organism: Secondary | ICD-10-CM | POA: Diagnosis not present

## 2016-03-16 DIAGNOSIS — M86472 Chronic osteomyelitis with draining sinus, left ankle and foot: Secondary | ICD-10-CM | POA: Diagnosis not present

## 2016-03-16 DIAGNOSIS — T8753 Necrosis of amputation stump, right lower extremity: Secondary | ICD-10-CM | POA: Diagnosis not present

## 2016-03-16 DIAGNOSIS — I70263 Atherosclerosis of native arteries of extremities with gangrene, bilateral legs: Secondary | ICD-10-CM | POA: Diagnosis not present

## 2016-03-20 DIAGNOSIS — M86472 Chronic osteomyelitis with draining sinus, left ankle and foot: Secondary | ICD-10-CM | POA: Diagnosis not present

## 2016-03-20 DIAGNOSIS — L03116 Cellulitis of left lower limb: Secondary | ICD-10-CM | POA: Diagnosis not present

## 2016-03-20 DIAGNOSIS — I70263 Atherosclerosis of native arteries of extremities with gangrene, bilateral legs: Secondary | ICD-10-CM | POA: Diagnosis not present

## 2016-03-20 DIAGNOSIS — T8754 Necrosis of amputation stump, left lower extremity: Secondary | ICD-10-CM | POA: Diagnosis not present

## 2016-03-20 DIAGNOSIS — T8753 Necrosis of amputation stump, right lower extremity: Secondary | ICD-10-CM | POA: Diagnosis not present

## 2016-03-20 DIAGNOSIS — A419 Sepsis, unspecified organism: Secondary | ICD-10-CM | POA: Diagnosis not present

## 2016-03-21 ENCOUNTER — Encounter: Payer: Self-pay | Admitting: Internal Medicine

## 2016-03-21 ENCOUNTER — Ambulatory Visit (INDEPENDENT_AMBULATORY_CARE_PROVIDER_SITE_OTHER): Payer: Medicare Other | Admitting: Internal Medicine

## 2016-03-21 VITALS — BP 122/54 | HR 79 | Temp 98.3°F | Ht 71.0 in | Wt 154.8 lb

## 2016-03-21 DIAGNOSIS — M1000 Idiopathic gout, unspecified site: Secondary | ICD-10-CM

## 2016-03-21 DIAGNOSIS — Z87891 Personal history of nicotine dependence: Secondary | ICD-10-CM

## 2016-03-21 DIAGNOSIS — R338 Other retention of urine: Secondary | ICD-10-CM | POA: Diagnosis not present

## 2016-03-21 DIAGNOSIS — I480 Paroxysmal atrial fibrillation: Secondary | ICD-10-CM | POA: Diagnosis not present

## 2016-03-21 DIAGNOSIS — N183 Chronic kidney disease, stage 3 unspecified: Secondary | ICD-10-CM

## 2016-03-21 DIAGNOSIS — M109 Gout, unspecified: Secondary | ICD-10-CM

## 2016-03-21 DIAGNOSIS — I872 Venous insufficiency (chronic) (peripheral): Secondary | ICD-10-CM | POA: Insufficient documentation

## 2016-03-21 MED ORDER — ALLOPURINOL 100 MG PO TABS: 200.0000 mg | ORAL_TABLET | Freq: Every day | ORAL | Status: DC

## 2016-03-21 MED ORDER — POTASSIUM CHLORIDE CRYS ER 20 MEQ PO TBCR: 20.0000 meq | EXTENDED_RELEASE_TABLET | Freq: Every day | ORAL | Status: DC

## 2016-03-21 MED ORDER — FUROSEMIDE 40 MG PO TABS: 40.0000 mg | ORAL_TABLET | Freq: Every day | ORAL | Status: DC

## 2016-03-21 NOTE — Assessment & Plan Note (Signed)
He is currently in intercritical gout. His last uric acid in March 2017 was 8.1 on allopurinol 100mg  daily; his GFR is hanging around 30 so we have room to go up. I've increased his dose to 200mg  daily. We'll re-check another uric acid at his next visit along with a basic metabolic panel, and consider increasing the allopurinol another 100mg  if his uric acid remains above 7.

## 2016-03-21 NOTE — Progress Notes (Signed)
Patient ID: Vincent PigeonRobert T Miranda, male   DOB: June 16, 1925, 82 y.o.   MRN: [id number] [facility] Subjective:   Patient ID: Vincent Miranda male   DOB: June 16, 1925 82 y.o.   MRN: [id number]  HPI: VincentVincent Miranda is a 82 y.o. male with paroxysmal atrial fibrillation not on anticoagulation, acute urinary retention with an indwelling Foley catheter, hypertension, gout, and gastroesophageal reflux disease presenting to clinic for follow-up of chronic kidney disease and gout..  Paroxysmal atrial fibrillation not on anticoagulation: His wife prefers he not take warfarin so he is currently not on anticoagulation. He was previously rate-controlled with metoprolol but this was stopped at a clinic visit two weeks ago. He denies any palpitations or unilateral weakness.  Gout: His last uric acid in March 2017 was 8.2 while taking allopurinol 100mg  daily. He has not had any gout attacks in the last few years and has not had any side effects from the allopurinol.  He has a 43 year pack year smoking history but is not currently smoking, and I have reviewed his medications with him today.  Review of Systems  Constitutional: Negative for fever, chills, weight loss and malaise/fatigue.  Respiratory: Negative for cough, shortness of breath and wheezing.   Cardiovascular: Positive for leg swelling. Negative for chest pain, palpitations and orthopnea.  Skin: Negative for rash.    Objective:  Physical Exam: Filed Vitals:   03/21/16 1616  BP: 122/54  Pulse: 79  Temp: 98.3 F (36.8 C)  TempSrc: Oral  Height: 5\' 11"  (1.803 m)  Weight: 154 lb 12.8 oz (70.217 kg)  SpO2: 99%   General: very friendly elderly black man resting in wheelchair comfortably, appropriately conversational Cardiac: regular rate and rhythm, no rubs, murmurs or gallops Pulm: breathing well, clear to auscultation bilaterally Abd: Foley in place, no abdominal tenderness Ext: warm and well perfused, with 2+  pedal edema to shins with chronic venous stasis changes Neuro: alert and oriented X3, cranial nerves II-XII grossly intact, moving all extremities well  Assessment & Plan:  Case discussed with Dr. Josem KaufmannKlima  CKD (chronic kidney disease), stage III I'd like to simplify his diuretic regimen today. He is taking furosemide 40mg  in the morning and 20mg  in the evening; chronic kidney disease is the only reason I can surmise he is taking furosemide. He does not have bibasilar crackles on exam and his last echo in April 2017 showed an ejection fraction of 55-60% without diastolic dysfunction. I've decreased his furosemide to 40mg  once in the morning and I've dropped his potassium to 20mEq in the morning as well. At the next visit, we can check another basic metabolic panel and consider stopping the furosemide and potassium altogether.  Gout He is currently in intercritical gout. His last uric acid in March 2017 was 8.1 on allopurinol 100mg  daily; his GFR is hanging around 30 so we have room to go up. I've increased his dose to 200mg  daily. We'll re-check another uric acid at his next visit along with a basic metabolic panel, and consider increasing the allopurinol another 100mg  if his uric acid remains above 7.   Medications Ordered Meds ordered this encounter  Medications  . furosemide (LASIX) 40 MG tablet    Sig: Take 1 tablet (40 mg total) by mouth daily.    Dispense:  90 tablet    Refill:  3  . DISCONTD: allopurinol (ZYLOPRIM) 100 MG tablet    Sig: Take 2 tablets (200 mg total) by mouth daily.  Dispense:  60 tablet    Refill:  0  . DISCONTD: potassium chloride SA (K-DUR,KLOR-CON) 20 MEQ tablet    Sig: Take 1 tablet (20 mEq total) by mouth daily.    Dispense:  90 tablet    Refill:  1  . allopurinol (ZYLOPRIM) 100 MG tablet    Sig: Take 2 tablets (200 mg total) by mouth daily.    Dispense:  180 tablet    Refill:  3  . potassium chloride SA (K-DUR,KLOR-CON) 20 MEQ tablet    Sig: Take 1 tablet  (20 mEq total) by mouth daily.    Dispense:  90 tablet    Refill:  3    Follow Up: Return for in 1-3 months for PCP follow-up with Dr. Earlene Plater.

## 2016-03-21 NOTE — Assessment & Plan Note (Signed)
I'd like to simplify his diuretic regimen today. He is taking furosemide 40mg  in the morning and 20mg  in the evening; chronic kidney disease is the only reason I can surmise he is taking furosemide. He does not have bibasilar crackles on exam and his last echo in April 2017 showed an ejection fraction of 55-60% without diastolic dysfunction. I've decreased his furosemide to 40mg  once in the morning and I've dropped his potassium to 20mEq in the morning as well. At the next visit, we can check another basic metabolic panel and consider stopping the furosemide and potassium altogether.

## 2016-03-21 NOTE — Patient Instructions (Signed)
[name],  It was great to meet you today.  We've made some slight tweaks to your medications to make them more convenient.  You're safe to take furosemide 40mg  daily in the morning with potassium 20mEq in the morning as well.  Your gout level was a little high back in March so I've also increased your allopurinol from 100 to 200mg  daily.  We'll get you an appointment to meet your new primary doctor, Dr. Earlene PlaterWallace, in the next 1-3 months.  Take care, Dr. Earnest ConroyFlores

## 2016-03-22 NOTE — Progress Notes (Signed)
Case discussed with Dr. Earnest ConroyFlores at time of visit.  We reviewed the resident's history and exam and pertinent patient test results.  I agree with the assessment, diagnosis, and plan of care documented in the resident's note. With the following correction: the target level for the uric acid level is < 6.0.  We will continue to slowly titrate up the allopurinol until the uric acid level is < 6.0.

## 2016-03-24 DIAGNOSIS — I70263 Atherosclerosis of native arteries of extremities with gangrene, bilateral legs: Secondary | ICD-10-CM | POA: Diagnosis not present

## 2016-03-24 DIAGNOSIS — T8754 Necrosis of amputation stump, left lower extremity: Secondary | ICD-10-CM | POA: Diagnosis not present

## 2016-03-24 DIAGNOSIS — A419 Sepsis, unspecified organism: Secondary | ICD-10-CM | POA: Diagnosis not present

## 2016-03-24 DIAGNOSIS — M86472 Chronic osteomyelitis with draining sinus, left ankle and foot: Secondary | ICD-10-CM | POA: Diagnosis not present

## 2016-03-24 DIAGNOSIS — L03116 Cellulitis of left lower limb: Secondary | ICD-10-CM | POA: Diagnosis not present

## 2016-03-24 DIAGNOSIS — T8753 Necrosis of amputation stump, right lower extremity: Secondary | ICD-10-CM | POA: Diagnosis not present

## 2016-03-27 DIAGNOSIS — M86472 Chronic osteomyelitis with draining sinus, left ankle and foot: Secondary | ICD-10-CM | POA: Diagnosis not present

## 2016-03-27 DIAGNOSIS — I70263 Atherosclerosis of native arteries of extremities with gangrene, bilateral legs: Secondary | ICD-10-CM | POA: Diagnosis not present

## 2016-03-27 DIAGNOSIS — T8753 Necrosis of amputation stump, right lower extremity: Secondary | ICD-10-CM | POA: Diagnosis not present

## 2016-03-27 DIAGNOSIS — T8754 Necrosis of amputation stump, left lower extremity: Secondary | ICD-10-CM | POA: Diagnosis not present

## 2016-03-27 DIAGNOSIS — L03116 Cellulitis of left lower limb: Secondary | ICD-10-CM | POA: Diagnosis not present

## 2016-03-27 DIAGNOSIS — A419 Sepsis, unspecified organism: Secondary | ICD-10-CM | POA: Diagnosis not present

## 2016-03-29 DIAGNOSIS — A419 Sepsis, unspecified organism: Secondary | ICD-10-CM | POA: Diagnosis not present

## 2016-03-29 DIAGNOSIS — T8754 Necrosis of amputation stump, left lower extremity: Secondary | ICD-10-CM | POA: Diagnosis not present

## 2016-03-29 DIAGNOSIS — M86472 Chronic osteomyelitis with draining sinus, left ankle and foot: Secondary | ICD-10-CM | POA: Diagnosis not present

## 2016-03-29 DIAGNOSIS — T8753 Necrosis of amputation stump, right lower extremity: Secondary | ICD-10-CM | POA: Diagnosis not present

## 2016-03-29 DIAGNOSIS — I70263 Atherosclerosis of native arteries of extremities with gangrene, bilateral legs: Secondary | ICD-10-CM | POA: Diagnosis not present

## 2016-03-29 DIAGNOSIS — L03116 Cellulitis of left lower limb: Secondary | ICD-10-CM | POA: Diagnosis not present

## 2016-04-01 ENCOUNTER — Other Ambulatory Visit: Payer: Self-pay | Admitting: Internal Medicine

## 2016-04-04 ENCOUNTER — Telehealth: Payer: Self-pay | Admitting: Internal Medicine

## 2016-04-04 ENCOUNTER — Other Ambulatory Visit: Payer: Self-pay | Admitting: Internal Medicine

## 2016-04-04 NOTE — Telephone Encounter (Signed)
Refill sent 5/2 for 1 yr, called [facility], confirmed, mike stated it had been filled, pt never picked it up so it was restocked, he will get it ready again and call pt, tried to call spouse back but had to leave vmail for rtc

## 2016-04-04 NOTE — Telephone Encounter (Signed)
Pt rtn your call

## 2016-04-04 NOTE — Telephone Encounter (Signed)
Refill of furosemide (LASIX) 40 MG tablet [facility]

## 2016-04-05 DIAGNOSIS — N401 Enlarged prostate with lower urinary tract symptoms: Secondary | ICD-10-CM | POA: Diagnosis not present

## 2016-04-05 DIAGNOSIS — R338 Other retention of urine: Secondary | ICD-10-CM | POA: Diagnosis not present

## 2016-04-05 DIAGNOSIS — R351 Nocturia: Secondary | ICD-10-CM | POA: Diagnosis not present

## 2016-04-06 DIAGNOSIS — T8753 Necrosis of amputation stump, right lower extremity: Secondary | ICD-10-CM | POA: Diagnosis not present

## 2016-04-06 DIAGNOSIS — M86472 Chronic osteomyelitis with draining sinus, left ankle and foot: Secondary | ICD-10-CM | POA: Diagnosis not present

## 2016-04-06 DIAGNOSIS — T8754 Necrosis of amputation stump, left lower extremity: Secondary | ICD-10-CM | POA: Diagnosis not present

## 2016-04-06 DIAGNOSIS — L03116 Cellulitis of left lower limb: Secondary | ICD-10-CM | POA: Diagnosis not present

## 2016-04-06 DIAGNOSIS — A419 Sepsis, unspecified organism: Secondary | ICD-10-CM | POA: Diagnosis not present

## 2016-04-06 DIAGNOSIS — I70263 Atherosclerosis of native arteries of extremities with gangrene, bilateral legs: Secondary | ICD-10-CM | POA: Diagnosis not present

## 2016-04-07 ENCOUNTER — Telehealth: Payer: Self-pay | Admitting: *Deleted

## 2016-04-07 DIAGNOSIS — L03116 Cellulitis of left lower limb: Secondary | ICD-10-CM | POA: Diagnosis not present

## 2016-04-07 DIAGNOSIS — T8753 Necrosis of amputation stump, right lower extremity: Secondary | ICD-10-CM | POA: Diagnosis not present

## 2016-04-07 DIAGNOSIS — I70263 Atherosclerosis of native arteries of extremities with gangrene, bilateral legs: Secondary | ICD-10-CM | POA: Diagnosis not present

## 2016-04-07 DIAGNOSIS — A419 Sepsis, unspecified organism: Secondary | ICD-10-CM | POA: Diagnosis not present

## 2016-04-07 DIAGNOSIS — T8754 Necrosis of amputation stump, left lower extremity: Secondary | ICD-10-CM | POA: Diagnosis not present

## 2016-04-07 DIAGNOSIS — M86472 Chronic osteomyelitis with draining sinus, left ankle and foot: Secondary | ICD-10-CM | POA: Diagnosis not present

## 2016-04-07 NOTE — Telephone Encounter (Signed)
Sounds good. Thank you

## 2016-04-07 NOTE — Telephone Encounter (Signed)
HHN calls for recert approval, verbal ok is given, 2xweeks for 9 weeks, do you agree

## 2016-04-09 DIAGNOSIS — Z89412 Acquired absence of left great toe: Secondary | ICD-10-CM | POA: Diagnosis not present

## 2016-04-09 DIAGNOSIS — E43 Unspecified severe protein-calorie malnutrition: Secondary | ICD-10-CM | POA: Diagnosis not present

## 2016-04-09 DIAGNOSIS — L97221 Non-pressure chronic ulcer of left calf limited to breakdown of skin: Secondary | ICD-10-CM | POA: Diagnosis not present

## 2016-04-09 DIAGNOSIS — M1040 Other secondary gout, unspecified site: Secondary | ICD-10-CM | POA: Diagnosis not present

## 2016-04-09 DIAGNOSIS — M86472 Chronic osteomyelitis with draining sinus, left ankle and foot: Secondary | ICD-10-CM | POA: Diagnosis not present

## 2016-04-09 DIAGNOSIS — N183 Chronic kidney disease, stage 3 (moderate): Secondary | ICD-10-CM | POA: Diagnosis not present

## 2016-04-09 DIAGNOSIS — I4891 Unspecified atrial fibrillation: Secondary | ICD-10-CM | POA: Diagnosis not present

## 2016-04-09 DIAGNOSIS — L97311 Non-pressure chronic ulcer of right ankle limited to breakdown of skin: Secondary | ICD-10-CM | POA: Diagnosis not present

## 2016-04-09 DIAGNOSIS — I120 Hypertensive chronic kidney disease with stage 5 chronic kidney disease or end stage renal disease: Secondary | ICD-10-CM | POA: Diagnosis not present

## 2016-04-09 DIAGNOSIS — T8754 Necrosis of amputation stump, left lower extremity: Secondary | ICD-10-CM | POA: Diagnosis not present

## 2016-04-09 DIAGNOSIS — T8753 Necrosis of amputation stump, right lower extremity: Secondary | ICD-10-CM | POA: Diagnosis not present

## 2016-04-09 DIAGNOSIS — D649 Anemia, unspecified: Secondary | ICD-10-CM | POA: Diagnosis not present

## 2016-04-09 DIAGNOSIS — Z89421 Acquired absence of other right toe(s): Secondary | ICD-10-CM | POA: Diagnosis not present

## 2016-04-09 DIAGNOSIS — I70263 Atherosclerosis of native arteries of extremities with gangrene, bilateral legs: Secondary | ICD-10-CM | POA: Diagnosis not present

## 2016-04-11 DIAGNOSIS — T8753 Necrosis of amputation stump, right lower extremity: Secondary | ICD-10-CM | POA: Diagnosis not present

## 2016-04-11 DIAGNOSIS — L97221 Non-pressure chronic ulcer of left calf limited to breakdown of skin: Secondary | ICD-10-CM | POA: Diagnosis not present

## 2016-04-11 DIAGNOSIS — I70263 Atherosclerosis of native arteries of extremities with gangrene, bilateral legs: Secondary | ICD-10-CM | POA: Diagnosis not present

## 2016-04-11 DIAGNOSIS — I120 Hypertensive chronic kidney disease with stage 5 chronic kidney disease or end stage renal disease: Secondary | ICD-10-CM | POA: Diagnosis not present

## 2016-04-11 DIAGNOSIS — L97311 Non-pressure chronic ulcer of right ankle limited to breakdown of skin: Secondary | ICD-10-CM | POA: Diagnosis not present

## 2016-04-11 DIAGNOSIS — M86472 Chronic osteomyelitis with draining sinus, left ankle and foot: Secondary | ICD-10-CM | POA: Diagnosis not present

## 2016-04-14 DIAGNOSIS — L97221 Non-pressure chronic ulcer of left calf limited to breakdown of skin: Secondary | ICD-10-CM | POA: Diagnosis not present

## 2016-04-14 DIAGNOSIS — L97311 Non-pressure chronic ulcer of right ankle limited to breakdown of skin: Secondary | ICD-10-CM | POA: Diagnosis not present

## 2016-04-14 DIAGNOSIS — T8753 Necrosis of amputation stump, right lower extremity: Secondary | ICD-10-CM | POA: Diagnosis not present

## 2016-04-14 DIAGNOSIS — I70263 Atherosclerosis of native arteries of extremities with gangrene, bilateral legs: Secondary | ICD-10-CM | POA: Diagnosis not present

## 2016-04-14 DIAGNOSIS — M86472 Chronic osteomyelitis with draining sinus, left ankle and foot: Secondary | ICD-10-CM | POA: Diagnosis not present

## 2016-04-14 DIAGNOSIS — I120 Hypertensive chronic kidney disease with stage 5 chronic kidney disease or end stage renal disease: Secondary | ICD-10-CM | POA: Diagnosis not present

## 2016-04-17 DIAGNOSIS — I70263 Atherosclerosis of native arteries of extremities with gangrene, bilateral legs: Secondary | ICD-10-CM | POA: Diagnosis not present

## 2016-04-17 DIAGNOSIS — M86472 Chronic osteomyelitis with draining sinus, left ankle and foot: Secondary | ICD-10-CM | POA: Diagnosis not present

## 2016-04-17 DIAGNOSIS — L97311 Non-pressure chronic ulcer of right ankle limited to breakdown of skin: Secondary | ICD-10-CM | POA: Diagnosis not present

## 2016-04-17 DIAGNOSIS — L97221 Non-pressure chronic ulcer of left calf limited to breakdown of skin: Secondary | ICD-10-CM | POA: Diagnosis not present

## 2016-04-17 DIAGNOSIS — I120 Hypertensive chronic kidney disease with stage 5 chronic kidney disease or end stage renal disease: Secondary | ICD-10-CM | POA: Diagnosis not present

## 2016-04-17 DIAGNOSIS — T8753 Necrosis of amputation stump, right lower extremity: Secondary | ICD-10-CM | POA: Diagnosis not present

## 2016-04-18 DIAGNOSIS — M86472 Chronic osteomyelitis with draining sinus, left ankle and foot: Secondary | ICD-10-CM | POA: Diagnosis not present

## 2016-04-18 DIAGNOSIS — I70263 Atherosclerosis of native arteries of extremities with gangrene, bilateral legs: Secondary | ICD-10-CM | POA: Diagnosis not present

## 2016-04-18 DIAGNOSIS — I120 Hypertensive chronic kidney disease with stage 5 chronic kidney disease or end stage renal disease: Secondary | ICD-10-CM | POA: Diagnosis not present

## 2016-04-18 DIAGNOSIS — L97311 Non-pressure chronic ulcer of right ankle limited to breakdown of skin: Secondary | ICD-10-CM | POA: Diagnosis not present

## 2016-04-18 DIAGNOSIS — L97221 Non-pressure chronic ulcer of left calf limited to breakdown of skin: Secondary | ICD-10-CM | POA: Diagnosis not present

## 2016-04-18 DIAGNOSIS — T8753 Necrosis of amputation stump, right lower extremity: Secondary | ICD-10-CM | POA: Diagnosis not present

## 2016-04-20 ENCOUNTER — Telehealth: Payer: Self-pay

## 2016-04-20 DIAGNOSIS — L97221 Non-pressure chronic ulcer of left calf limited to breakdown of skin: Secondary | ICD-10-CM | POA: Diagnosis not present

## 2016-04-20 DIAGNOSIS — I120 Hypertensive chronic kidney disease with stage 5 chronic kidney disease or end stage renal disease: Secondary | ICD-10-CM | POA: Diagnosis not present

## 2016-04-20 DIAGNOSIS — L97311 Non-pressure chronic ulcer of right ankle limited to breakdown of skin: Secondary | ICD-10-CM | POA: Diagnosis not present

## 2016-04-20 DIAGNOSIS — M86472 Chronic osteomyelitis with draining sinus, left ankle and foot: Secondary | ICD-10-CM | POA: Diagnosis not present

## 2016-04-20 DIAGNOSIS — I70263 Atherosclerosis of native arteries of extremities with gangrene, bilateral legs: Secondary | ICD-10-CM | POA: Diagnosis not present

## 2016-04-20 DIAGNOSIS — T8753 Necrosis of amputation stump, right lower extremity: Secondary | ICD-10-CM | POA: Diagnosis not present

## 2016-04-20 NOTE — Telephone Encounter (Signed)
Spoke with Selena BattenKim, patients wounds have deteriorated in less than a weeks time.  Patient also has a new wound that surfaced on Monday this week.  [facility] that patient "not acting right," no specific complaints and RN says no real change to mental status.  Patient vitals are stable other than BP of 98/52, RN does report LE swelling more than normal.  [facility] to evaluate patient from home.  I have scheduled them for evaluation tomorrow afternoon.

## 2016-04-20 NOTE — Telephone Encounter (Signed)
Thank you Leigh.. If he worsens I would recommend going to the ED for further evaluation

## 2016-04-20 NOTE — Telephone Encounter (Signed)
[name] nurse to call back regarding pt assessments.

## 2016-04-21 ENCOUNTER — Ambulatory Visit: Payer: Medicare Other | Admitting: Pulmonary Disease

## 2016-04-21 NOTE — Telephone Encounter (Signed)
Patients wife called adamant that patient does not need to be seen.  She is requesting that we order labwork (I assume to check WBC for infection) be done at home.  She repeated multiple times that it is difficult on both her and the patient to come here and that during last OV [facility] without any issue.  I explained to her that assessing infection for patient with chronic wounds includes more than labwork and physician would need to evaluate wounds to decide next steps, not just a WBC.  She reports patient feels fine, his BP and HR were 113/72 & 81 this morning and he does not want to be seen.    After my attempts to educate them on avoiding waiting too long to be seen, what the deteriorating wounds could mean, and the benefit of coming to clinic rather than waiting until more urgent need they still request that I ask [facility] draw labs.

## 2016-04-21 NOTE — Telephone Encounter (Signed)
Thank you Leigh... The wounds will need to be evaluated irrespective of what the white count is. I still recommend he follow up in the clinic. I would forward this to Dr. Earlene PlaterWallace as well so he is aware.

## 2016-04-21 NOTE — Telephone Encounter (Signed)
Called back to speak with patients wife, to confirm appointment.  She has gone to work and cancels today's appointment.  Still insistent on "simple blood work drawn by the home health nurse."  She will call back next week to schedule "if she can convince patient to come in.  She is encouraged not to delay having his wounds evaluated and cautioned with s/s of systemic infection that would indicate the need to have patient seen.

## 2016-04-25 DIAGNOSIS — L97311 Non-pressure chronic ulcer of right ankle limited to breakdown of skin: Secondary | ICD-10-CM | POA: Diagnosis not present

## 2016-04-25 DIAGNOSIS — M86472 Chronic osteomyelitis with draining sinus, left ankle and foot: Secondary | ICD-10-CM | POA: Diagnosis not present

## 2016-04-25 DIAGNOSIS — L97221 Non-pressure chronic ulcer of left calf limited to breakdown of skin: Secondary | ICD-10-CM | POA: Diagnosis not present

## 2016-04-25 DIAGNOSIS — T8753 Necrosis of amputation stump, right lower extremity: Secondary | ICD-10-CM | POA: Diagnosis not present

## 2016-04-25 DIAGNOSIS — I120 Hypertensive chronic kidney disease with stage 5 chronic kidney disease or end stage renal disease: Secondary | ICD-10-CM | POA: Diagnosis not present

## 2016-04-25 DIAGNOSIS — I70263 Atherosclerosis of native arteries of extremities with gangrene, bilateral legs: Secondary | ICD-10-CM | POA: Diagnosis not present

## 2016-04-27 ENCOUNTER — Telehealth: Payer: Self-pay

## 2016-04-27 DIAGNOSIS — T8753 Necrosis of amputation stump, right lower extremity: Secondary | ICD-10-CM | POA: Diagnosis not present

## 2016-04-27 DIAGNOSIS — I120 Hypertensive chronic kidney disease with stage 5 chronic kidney disease or end stage renal disease: Secondary | ICD-10-CM | POA: Diagnosis not present

## 2016-04-27 DIAGNOSIS — M86472 Chronic osteomyelitis with draining sinus, left ankle and foot: Secondary | ICD-10-CM | POA: Diagnosis not present

## 2016-04-27 DIAGNOSIS — I70263 Atherosclerosis of native arteries of extremities with gangrene, bilateral legs: Secondary | ICD-10-CM | POA: Diagnosis not present

## 2016-04-27 DIAGNOSIS — L97221 Non-pressure chronic ulcer of left calf limited to breakdown of skin: Secondary | ICD-10-CM | POA: Diagnosis not present

## 2016-04-27 DIAGNOSIS — L97311 Non-pressure chronic ulcer of right ankle limited to breakdown of skin: Secondary | ICD-10-CM | POA: Diagnosis not present

## 2016-04-27 NOTE — Telephone Encounter (Signed)
[name] nurse to call back regarding wound on the both leg. Please call back.

## 2016-04-27 NOTE — Telephone Encounter (Signed)
Spoke with Vincent BattenKim who is concerned about the multiple bilateral wounds on Vincent Miranda's feet. Reports some of the healed wounds have opened back up. Spoke with patient who is refusing to come in for appointment due to lack of transportation. Will call patient tomorrow after he has a chance to speak with wife.

## 2016-04-28 NOTE — Telephone Encounter (Signed)
Thank you Morrie SheldonAshley. I know Vincent Miranda well. He has severe peripheral artery disease and wounds that are essentially beyond conservative treatment. His best option is amputation which he has declined in the past. We have done antibiotics as a temporizing measure, but I agree that we need to assess him in clinic first. Likely his infections will progress to cause more pain, more somnolence, and poor functioning; at that time he will need admission.

## 2016-04-28 NOTE — Telephone Encounter (Signed)
Spoke with patient again who said that he did not have a chance to speak with his wife yesterday about transportation for an appointment. Patient gave me permission to call his wife and discuss this.  Spoke with the wife who was adamant that she was not going to bring in the patient. She does not want to take the day off of work. States "If I am concerned about his feet, I will bring him in". Discussed the concern about the decline of the wounds and wife adamant that she did not think it was bad enough to bring him. States "I don't know why they wont do blood work at home to see if he has an infection and give him antibiotics". Discussed the need to visually assess the wound progression and the possibility of the needed to change the type of wound care. Wife still refused an appointment. Advised to take patient to ED for any fever, worsening wound appearance, or any other concerns.

## 2016-05-02 DIAGNOSIS — L97221 Non-pressure chronic ulcer of left calf limited to breakdown of skin: Secondary | ICD-10-CM | POA: Diagnosis not present

## 2016-05-02 DIAGNOSIS — M86472 Chronic osteomyelitis with draining sinus, left ankle and foot: Secondary | ICD-10-CM | POA: Diagnosis not present

## 2016-05-02 DIAGNOSIS — L97311 Non-pressure chronic ulcer of right ankle limited to breakdown of skin: Secondary | ICD-10-CM | POA: Diagnosis not present

## 2016-05-02 DIAGNOSIS — I120 Hypertensive chronic kidney disease with stage 5 chronic kidney disease or end stage renal disease: Secondary | ICD-10-CM | POA: Diagnosis not present

## 2016-05-02 DIAGNOSIS — I70263 Atherosclerosis of native arteries of extremities with gangrene, bilateral legs: Secondary | ICD-10-CM | POA: Diagnosis not present

## 2016-05-02 DIAGNOSIS — T8753 Necrosis of amputation stump, right lower extremity: Secondary | ICD-10-CM | POA: Diagnosis not present

## 2016-05-05 DIAGNOSIS — I70263 Atherosclerosis of native arteries of extremities with gangrene, bilateral legs: Secondary | ICD-10-CM | POA: Diagnosis not present

## 2016-05-05 DIAGNOSIS — L97311 Non-pressure chronic ulcer of right ankle limited to breakdown of skin: Secondary | ICD-10-CM | POA: Diagnosis not present

## 2016-05-05 DIAGNOSIS — M86472 Chronic osteomyelitis with draining sinus, left ankle and foot: Secondary | ICD-10-CM | POA: Diagnosis not present

## 2016-05-05 DIAGNOSIS — T8753 Necrosis of amputation stump, right lower extremity: Secondary | ICD-10-CM | POA: Diagnosis not present

## 2016-05-05 DIAGNOSIS — I120 Hypertensive chronic kidney disease with stage 5 chronic kidney disease or end stage renal disease: Secondary | ICD-10-CM | POA: Diagnosis not present

## 2016-05-05 DIAGNOSIS — L97221 Non-pressure chronic ulcer of left calf limited to breakdown of skin: Secondary | ICD-10-CM | POA: Diagnosis not present

## 2016-05-09 DIAGNOSIS — L97311 Non-pressure chronic ulcer of right ankle limited to breakdown of skin: Secondary | ICD-10-CM | POA: Diagnosis not present

## 2016-05-09 DIAGNOSIS — L97221 Non-pressure chronic ulcer of left calf limited to breakdown of skin: Secondary | ICD-10-CM | POA: Diagnosis not present

## 2016-05-09 DIAGNOSIS — T8753 Necrosis of amputation stump, right lower extremity: Secondary | ICD-10-CM | POA: Diagnosis not present

## 2016-05-09 DIAGNOSIS — I120 Hypertensive chronic kidney disease with stage 5 chronic kidney disease or end stage renal disease: Secondary | ICD-10-CM | POA: Diagnosis not present

## 2016-05-09 DIAGNOSIS — M86472 Chronic osteomyelitis with draining sinus, left ankle and foot: Secondary | ICD-10-CM | POA: Diagnosis not present

## 2016-05-09 DIAGNOSIS — I70263 Atherosclerosis of native arteries of extremities with gangrene, bilateral legs: Secondary | ICD-10-CM | POA: Diagnosis not present

## 2016-05-11 DIAGNOSIS — L97311 Non-pressure chronic ulcer of right ankle limited to breakdown of skin: Secondary | ICD-10-CM | POA: Diagnosis not present

## 2016-05-11 DIAGNOSIS — L97221 Non-pressure chronic ulcer of left calf limited to breakdown of skin: Secondary | ICD-10-CM | POA: Diagnosis not present

## 2016-05-11 DIAGNOSIS — I70263 Atherosclerosis of native arteries of extremities with gangrene, bilateral legs: Secondary | ICD-10-CM | POA: Diagnosis not present

## 2016-05-11 DIAGNOSIS — T8753 Necrosis of amputation stump, right lower extremity: Secondary | ICD-10-CM | POA: Diagnosis not present

## 2016-05-11 DIAGNOSIS — I120 Hypertensive chronic kidney disease with stage 5 chronic kidney disease or end stage renal disease: Secondary | ICD-10-CM | POA: Diagnosis not present

## 2016-05-11 DIAGNOSIS — M86472 Chronic osteomyelitis with draining sinus, left ankle and foot: Secondary | ICD-10-CM | POA: Diagnosis not present

## 2016-05-16 DIAGNOSIS — M86472 Chronic osteomyelitis with draining sinus, left ankle and foot: Secondary | ICD-10-CM | POA: Diagnosis not present

## 2016-05-16 DIAGNOSIS — L97311 Non-pressure chronic ulcer of right ankle limited to breakdown of skin: Secondary | ICD-10-CM | POA: Diagnosis not present

## 2016-05-16 DIAGNOSIS — T8753 Necrosis of amputation stump, right lower extremity: Secondary | ICD-10-CM | POA: Diagnosis not present

## 2016-05-16 DIAGNOSIS — I70263 Atherosclerosis of native arteries of extremities with gangrene, bilateral legs: Secondary | ICD-10-CM | POA: Diagnosis not present

## 2016-05-16 DIAGNOSIS — I120 Hypertensive chronic kidney disease with stage 5 chronic kidney disease or end stage renal disease: Secondary | ICD-10-CM | POA: Diagnosis not present

## 2016-05-16 DIAGNOSIS — L97221 Non-pressure chronic ulcer of left calf limited to breakdown of skin: Secondary | ICD-10-CM | POA: Diagnosis not present

## 2016-05-18 DIAGNOSIS — L97221 Non-pressure chronic ulcer of left calf limited to breakdown of skin: Secondary | ICD-10-CM | POA: Diagnosis not present

## 2016-05-18 DIAGNOSIS — I120 Hypertensive chronic kidney disease with stage 5 chronic kidney disease or end stage renal disease: Secondary | ICD-10-CM | POA: Diagnosis not present

## 2016-05-18 DIAGNOSIS — L97311 Non-pressure chronic ulcer of right ankle limited to breakdown of skin: Secondary | ICD-10-CM | POA: Diagnosis not present

## 2016-05-18 DIAGNOSIS — T8753 Necrosis of amputation stump, right lower extremity: Secondary | ICD-10-CM | POA: Diagnosis not present

## 2016-05-18 DIAGNOSIS — I70263 Atherosclerosis of native arteries of extremities with gangrene, bilateral legs: Secondary | ICD-10-CM | POA: Diagnosis not present

## 2016-05-18 DIAGNOSIS — M86472 Chronic osteomyelitis with draining sinus, left ankle and foot: Secondary | ICD-10-CM | POA: Diagnosis not present

## 2016-05-23 DIAGNOSIS — I120 Hypertensive chronic kidney disease with stage 5 chronic kidney disease or end stage renal disease: Secondary | ICD-10-CM | POA: Diagnosis not present

## 2016-05-23 DIAGNOSIS — M86472 Chronic osteomyelitis with draining sinus, left ankle and foot: Secondary | ICD-10-CM | POA: Diagnosis not present

## 2016-05-23 DIAGNOSIS — I70263 Atherosclerosis of native arteries of extremities with gangrene, bilateral legs: Secondary | ICD-10-CM | POA: Diagnosis not present

## 2016-05-23 DIAGNOSIS — T8753 Necrosis of amputation stump, right lower extremity: Secondary | ICD-10-CM | POA: Diagnosis not present

## 2016-05-23 DIAGNOSIS — L97311 Non-pressure chronic ulcer of right ankle limited to breakdown of skin: Secondary | ICD-10-CM | POA: Diagnosis not present

## 2016-05-23 DIAGNOSIS — L97221 Non-pressure chronic ulcer of left calf limited to breakdown of skin: Secondary | ICD-10-CM | POA: Diagnosis not present

## 2016-05-25 DIAGNOSIS — I120 Hypertensive chronic kidney disease with stage 5 chronic kidney disease or end stage renal disease: Secondary | ICD-10-CM | POA: Diagnosis not present

## 2016-05-25 DIAGNOSIS — L97311 Non-pressure chronic ulcer of right ankle limited to breakdown of skin: Secondary | ICD-10-CM | POA: Diagnosis not present

## 2016-05-25 DIAGNOSIS — L97221 Non-pressure chronic ulcer of left calf limited to breakdown of skin: Secondary | ICD-10-CM | POA: Diagnosis not present

## 2016-05-25 DIAGNOSIS — M86472 Chronic osteomyelitis with draining sinus, left ankle and foot: Secondary | ICD-10-CM | POA: Diagnosis not present

## 2016-05-25 DIAGNOSIS — I70263 Atherosclerosis of native arteries of extremities with gangrene, bilateral legs: Secondary | ICD-10-CM | POA: Diagnosis not present

## 2016-05-25 DIAGNOSIS — T8753 Necrosis of amputation stump, right lower extremity: Secondary | ICD-10-CM | POA: Diagnosis not present

## 2016-05-30 DIAGNOSIS — T8753 Necrosis of amputation stump, right lower extremity: Secondary | ICD-10-CM | POA: Diagnosis not present

## 2016-05-30 DIAGNOSIS — L97311 Non-pressure chronic ulcer of right ankle limited to breakdown of skin: Secondary | ICD-10-CM | POA: Diagnosis not present

## 2016-05-30 DIAGNOSIS — L97221 Non-pressure chronic ulcer of left calf limited to breakdown of skin: Secondary | ICD-10-CM | POA: Diagnosis not present

## 2016-05-30 DIAGNOSIS — I120 Hypertensive chronic kidney disease with stage 5 chronic kidney disease or end stage renal disease: Secondary | ICD-10-CM | POA: Diagnosis not present

## 2016-05-30 DIAGNOSIS — I70263 Atherosclerosis of native arteries of extremities with gangrene, bilateral legs: Secondary | ICD-10-CM | POA: Diagnosis not present

## 2016-05-30 DIAGNOSIS — M86472 Chronic osteomyelitis with draining sinus, left ankle and foot: Secondary | ICD-10-CM | POA: Diagnosis not present

## 2016-06-02 DIAGNOSIS — T8753 Necrosis of amputation stump, right lower extremity: Secondary | ICD-10-CM | POA: Diagnosis not present

## 2016-06-02 DIAGNOSIS — I70263 Atherosclerosis of native arteries of extremities with gangrene, bilateral legs: Secondary | ICD-10-CM | POA: Diagnosis not present

## 2016-06-02 DIAGNOSIS — L97221 Non-pressure chronic ulcer of left calf limited to breakdown of skin: Secondary | ICD-10-CM | POA: Diagnosis not present

## 2016-06-02 DIAGNOSIS — I120 Hypertensive chronic kidney disease with stage 5 chronic kidney disease or end stage renal disease: Secondary | ICD-10-CM | POA: Diagnosis not present

## 2016-06-02 DIAGNOSIS — L97311 Non-pressure chronic ulcer of right ankle limited to breakdown of skin: Secondary | ICD-10-CM | POA: Diagnosis not present

## 2016-06-02 DIAGNOSIS — M86472 Chronic osteomyelitis with draining sinus, left ankle and foot: Secondary | ICD-10-CM | POA: Diagnosis not present

## 2016-06-05 ENCOUNTER — Telehealth: Payer: Self-pay | Admitting: Internal Medicine

## 2016-06-05 DIAGNOSIS — L97221 Non-pressure chronic ulcer of left calf limited to breakdown of skin: Secondary | ICD-10-CM | POA: Diagnosis not present

## 2016-06-05 DIAGNOSIS — L97311 Non-pressure chronic ulcer of right ankle limited to breakdown of skin: Secondary | ICD-10-CM | POA: Diagnosis not present

## 2016-06-05 DIAGNOSIS — T8753 Necrosis of amputation stump, right lower extremity: Secondary | ICD-10-CM | POA: Diagnosis not present

## 2016-06-05 DIAGNOSIS — I70263 Atherosclerosis of native arteries of extremities with gangrene, bilateral legs: Secondary | ICD-10-CM | POA: Diagnosis not present

## 2016-06-05 DIAGNOSIS — I120 Hypertensive chronic kidney disease with stage 5 chronic kidney disease or end stage renal disease: Secondary | ICD-10-CM | POA: Diagnosis not present

## 2016-06-05 DIAGNOSIS — M86472 Chronic osteomyelitis with draining sinus, left ankle and foot: Secondary | ICD-10-CM | POA: Diagnosis not present

## 2016-06-05 NOTE — Telephone Encounter (Signed)
Calling for recert orders

## 2016-06-06 NOTE — Telephone Encounter (Signed)
Yes, thank you.

## 2016-06-06 NOTE — Telephone Encounter (Signed)
Gave VO for recert only until 8/4, HHN states she is not making a improvements in pt's condition and has considered dropping pt but will continue care [facility], VO given, do you agree?

## 2016-06-07 DIAGNOSIS — M86472 Chronic osteomyelitis with draining sinus, left ankle and foot: Secondary | ICD-10-CM | POA: Diagnosis not present

## 2016-06-07 DIAGNOSIS — L97221 Non-pressure chronic ulcer of left calf limited to breakdown of skin: Secondary | ICD-10-CM | POA: Diagnosis not present

## 2016-06-07 DIAGNOSIS — I120 Hypertensive chronic kidney disease with stage 5 chronic kidney disease or end stage renal disease: Secondary | ICD-10-CM | POA: Diagnosis not present

## 2016-06-07 DIAGNOSIS — I70263 Atherosclerosis of native arteries of extremities with gangrene, bilateral legs: Secondary | ICD-10-CM | POA: Diagnosis not present

## 2016-06-07 DIAGNOSIS — L97311 Non-pressure chronic ulcer of right ankle limited to breakdown of skin: Secondary | ICD-10-CM | POA: Diagnosis not present

## 2016-06-07 DIAGNOSIS — T8753 Necrosis of amputation stump, right lower extremity: Secondary | ICD-10-CM | POA: Diagnosis not present

## 2016-06-08 DIAGNOSIS — I70263 Atherosclerosis of native arteries of extremities with gangrene, bilateral legs: Secondary | ICD-10-CM | POA: Diagnosis not present

## 2016-06-08 DIAGNOSIS — E43 Unspecified severe protein-calorie malnutrition: Secondary | ICD-10-CM | POA: Diagnosis not present

## 2016-06-08 DIAGNOSIS — D649 Anemia, unspecified: Secondary | ICD-10-CM | POA: Diagnosis not present

## 2016-06-08 DIAGNOSIS — I4891 Unspecified atrial fibrillation: Secondary | ICD-10-CM | POA: Diagnosis not present

## 2016-06-08 DIAGNOSIS — M86472 Chronic osteomyelitis with draining sinus, left ankle and foot: Secondary | ICD-10-CM | POA: Diagnosis not present

## 2016-06-08 DIAGNOSIS — L97311 Non-pressure chronic ulcer of right ankle limited to breakdown of skin: Secondary | ICD-10-CM | POA: Diagnosis not present

## 2016-06-08 DIAGNOSIS — T8753 Necrosis of amputation stump, right lower extremity: Secondary | ICD-10-CM | POA: Diagnosis not present

## 2016-06-08 DIAGNOSIS — I120 Hypertensive chronic kidney disease with stage 5 chronic kidney disease or end stage renal disease: Secondary | ICD-10-CM | POA: Diagnosis not present

## 2016-06-08 DIAGNOSIS — Z89421 Acquired absence of other right toe(s): Secondary | ICD-10-CM | POA: Diagnosis not present

## 2016-06-08 DIAGNOSIS — M1040 Other secondary gout, unspecified site: Secondary | ICD-10-CM | POA: Diagnosis not present

## 2016-06-08 DIAGNOSIS — N183 Chronic kidney disease, stage 3 (moderate): Secondary | ICD-10-CM | POA: Diagnosis not present

## 2016-06-08 DIAGNOSIS — Z89412 Acquired absence of left great toe: Secondary | ICD-10-CM | POA: Diagnosis not present

## 2016-06-15 DIAGNOSIS — I70263 Atherosclerosis of native arteries of extremities with gangrene, bilateral legs: Secondary | ICD-10-CM | POA: Diagnosis not present

## 2016-06-15 DIAGNOSIS — M86472 Chronic osteomyelitis with draining sinus, left ankle and foot: Secondary | ICD-10-CM | POA: Diagnosis not present

## 2016-06-15 DIAGNOSIS — T8753 Necrosis of amputation stump, right lower extremity: Secondary | ICD-10-CM | POA: Diagnosis not present

## 2016-06-15 DIAGNOSIS — L97311 Non-pressure chronic ulcer of right ankle limited to breakdown of skin: Secondary | ICD-10-CM | POA: Diagnosis not present

## 2016-06-15 DIAGNOSIS — N183 Chronic kidney disease, stage 3 (moderate): Secondary | ICD-10-CM | POA: Diagnosis not present

## 2016-06-15 DIAGNOSIS — I120 Hypertensive chronic kidney disease with stage 5 chronic kidney disease or end stage renal disease: Secondary | ICD-10-CM | POA: Diagnosis not present

## 2016-06-16 DIAGNOSIS — M86472 Chronic osteomyelitis with draining sinus, left ankle and foot: Secondary | ICD-10-CM | POA: Diagnosis not present

## 2016-06-16 DIAGNOSIS — T8753 Necrosis of amputation stump, right lower extremity: Secondary | ICD-10-CM | POA: Diagnosis not present

## 2016-06-16 DIAGNOSIS — I70263 Atherosclerosis of native arteries of extremities with gangrene, bilateral legs: Secondary | ICD-10-CM | POA: Diagnosis not present

## 2016-06-16 DIAGNOSIS — N183 Chronic kidney disease, stage 3 (moderate): Secondary | ICD-10-CM | POA: Diagnosis not present

## 2016-06-16 DIAGNOSIS — L97311 Non-pressure chronic ulcer of right ankle limited to breakdown of skin: Secondary | ICD-10-CM | POA: Diagnosis not present

## 2016-06-16 DIAGNOSIS — I120 Hypertensive chronic kidney disease with stage 5 chronic kidney disease or end stage renal disease: Secondary | ICD-10-CM | POA: Diagnosis not present

## 2016-06-22 ENCOUNTER — Ambulatory Visit (INDEPENDENT_AMBULATORY_CARE_PROVIDER_SITE_OTHER): Payer: Medicare Other | Admitting: Internal Medicine

## 2016-06-22 VITALS — BP 149/73 | HR 78 | Temp 98.6°F | Ht 70.0 in | Wt 164.4 lb

## 2016-06-22 DIAGNOSIS — I70263 Atherosclerosis of native arteries of extremities with gangrene, bilateral legs: Secondary | ICD-10-CM | POA: Diagnosis not present

## 2016-06-22 DIAGNOSIS — R339 Retention of urine, unspecified: Secondary | ICD-10-CM

## 2016-06-22 DIAGNOSIS — Z87891 Personal history of nicotine dependence: Secondary | ICD-10-CM

## 2016-06-22 DIAGNOSIS — M109 Gout, unspecified: Secondary | ICD-10-CM | POA: Diagnosis not present

## 2016-06-22 DIAGNOSIS — Z7982 Long term (current) use of aspirin: Secondary | ICD-10-CM

## 2016-06-22 DIAGNOSIS — Z79899 Other long term (current) drug therapy: Secondary | ICD-10-CM | POA: Diagnosis not present

## 2016-06-22 DIAGNOSIS — I480 Paroxysmal atrial fibrillation: Secondary | ICD-10-CM | POA: Diagnosis not present

## 2016-06-22 DIAGNOSIS — M1000 Idiopathic gout, unspecified site: Secondary | ICD-10-CM

## 2016-06-22 DIAGNOSIS — I70269 Atherosclerosis of native arteries of extremities with gangrene, unspecified extremity: Secondary | ICD-10-CM

## 2016-06-22 DIAGNOSIS — N183 Chronic kidney disease, stage 3 unspecified: Secondary | ICD-10-CM

## 2016-06-22 MED ORDER — FINASTERIDE 5 MG PO TABS: 5.0000 mg | ORAL_TABLET | Freq: Every day | ORAL | 2 refills | Status: AC

## 2016-06-22 MED ORDER — FUROSEMIDE 40 MG PO TABS: 40.0000 mg | ORAL_TABLET | Freq: Two times a day (BID) | ORAL | 3 refills | Status: DC

## 2016-06-22 NOTE — Progress Notes (Addendum)
CC: here for follow up for CKD, a-fib, gout  HPI:  Vincent Miranda is a 82 y.o. man with a past medical history listed below here today for follow up of his CKD, a-fib, and gout.   For details of today's visit and the status of his chronic medical issues please refer to the assessment and plan.   Past Medical History:  Diagnosis Date  . Acute blood loss anemia 10/18/2013  . Anemia of chronic disease   . Chronic kidney disease   . Gout   . History of blood transfusion   . Hypertension     Review of Systems:   Review of Systems  Constitutional: Negative for chills and fever.  Respiratory: Negative for cough and shortness of breath.   Cardiovascular: Positive for leg swelling. Negative for chest pain.  Genitourinary: Negative for dysuria.  Skin:       Chronic wounds     Physical Exam:  Vitals:   06/22/16 1349  BP: (!) 149/73  Pulse: 78  Temp: 98.6 F (37 C)  TempSrc: Oral  SpO2: 96%  Weight: 164 lb 6.4 oz (74.6 kg)  Height: 5\' 10"  (1.778 m)   Physical Exam  Constitutional: He is oriented to person, place, and time.  Elderly, chronically ill-appearing man, ambulates with walker, no distress  HENT:  Head: Normocephalic and atraumatic.  Cardiovascular:  Irregular rhythm, normal rate.  Pulmonary/Chest: Effort normal and breath sounds normal.  Abdominal: Soft. He exhibits no distension. There is no tenderness.  Neurological: He is alert and oriented to person, place, and time.  Skin:  2+ woody edema bilaterally with evidence of venous stasis.  Feet are wrapped in dry gauze that are draining some yellow fluid through the gauze that was changed this morning per his wife.  There are superficial wounds visible above the bandages, however, rest of bandages not taken down.  Psychiatric: Mood and affect normal.    Assessment & Plan:   See Encounters Tab for problem based charting.  Patient seen with Dr. Josem Kaufmann   Gout A: His uric acid last visit was 8.1 on  allopurinol 100mg  daily.  His dose was increased to 200mg  daily.  He has been adherent to this dose.  P: - uric acid level checked and found to be 4.1 - continue current management as uric acid at therapeutic level.  Urinary retention A: Patient has followed up with urology and no longer has Foley catheter.  He is not having any urinary symptoms at this point.  His alfuzosin was discontinued by Urology and he is now on Finasteride 5mg  daily.  P: - continue Finasteride - renal ultrasound ordered to ensure no obstructive process contributing to his worsening CKD.  Paroxysmal atrial fibrillation (HCC) A: Rate is regular today, rhythm irregular.  Denies any chest pain.  His wife reiterates that she does not want him to be on anticoagulation.  She also does not want him to be on a rate controlling medication such as beta blocker.  He continues to take aspirin 81mg  daily, which he has been on for many years, per his wife.  P: - continue with Aspirin 81mg  daily.  Atherosclerotic peripheral vascular disease with gangrene [facility]) A: Patient was hospitalized in May 2016.  Seen in consult at that time by Dr. Lajoyce Corners who recommended transtibial amputations, however, patient and wife would not consent.  ABIs performed showing mild reduction in arterial flow on the right and moderate reduction in arterial flow on the left.  He  was hospitalized again earlier this year and was evaluated by Dr. Lajoyce Corners and Dr. Magnus Ivan as second opinion for the extensive ulceration of his left foot. They both recommended amputation. Patient and his wife again did not want to pursue that option. Our inpatient team had exntensive discussion with them and recommended surgery as the appropriate treatment for this extensive gangrenes.  Patient opted to try IV antibiotic instead of amputation.  His wife today reports that wounds had been doing better until recently when she feels they have "opened back up" again and she would like to check  some labs to make sure he does not have an infection.  He has a home health nurse who comes out a couple times per week to assess wounds and she does dressing changes on other days.  He has not had any systemic symptoms of infection.  No fever, chills, nausea, vomiting, altered mental status.    P: - see pictures from office visit 02/15/16 to get a sense of how extensive his wounds are.  This is a difficult situation as he and his wife are declining surgery which is likely the only way to appropriately deal with his wounds.   - CBC did not indicate infection. - his worsening lower extremity edema and chronic venous stasis are hindering his wound healing as is his protein-malnutrition. - our initial plan was to increase his Lasix and reassess in 1 month to see if his edema was improving, however, given his worsening renal function on BMET, I do not think increasing his Lasix is an option right now and may not be effective in taking off this edema that has obviously been present for a long time. - I will attempt to send patient for Unna boots to see if this will help his edema and promote some wound healing.  I will also refer [facility] for more appropriate treatment.  In the past, his wife has declined this but I am hopeful she will be willing to take him there. - Will try to see patient back within the next couple weeks.  I have asked our front office staff to attempt to schedule an appointment. - I attempted to call patient and discuss the plan on 06/24/16 at 1pm but had to leave a voice message.  Will attempt to contact again on Monday 06/26/16.  CKD (chronic kidney disease), stage III A: He is having worsening lower extremity edema per his wife which is negatively affecting his chronic feet wounds.    P: - initial plan was to increase Lasix to  BID from daily.  However, given his worsening renal function, we will continue with Lasix  daily.  Will also discontinue his  potassium supplementation given high-normal on BMET. - given his history of urinary retention, will order a renal ultrasound to ensure no obstructive process.  Can also consider bladder scan at follow up visit if patient has not done ultrasound by that time. - attempted to call patient and let him know of changes but had to leave voice message.  Will attempt to re-contact on Monday 8/7. - will see patient back in 2 weeks for repeat BMET.

## 2016-06-22 NOTE — Patient Instructions (Addendum)
I will let you know if anything is abnormal with your blood work.  Increasee the Lasix to 40mg  twice per day.  I will let you know about potassium and whether to increase or not.

## 2016-06-23 DIAGNOSIS — N183 Chronic kidney disease, stage 3 (moderate): Secondary | ICD-10-CM | POA: Diagnosis not present

## 2016-06-23 DIAGNOSIS — M86472 Chronic osteomyelitis with draining sinus, left ankle and foot: Secondary | ICD-10-CM | POA: Diagnosis not present

## 2016-06-23 DIAGNOSIS — T8753 Necrosis of amputation stump, right lower extremity: Secondary | ICD-10-CM | POA: Diagnosis not present

## 2016-06-23 DIAGNOSIS — I70263 Atherosclerosis of native arteries of extremities with gangrene, bilateral legs: Secondary | ICD-10-CM | POA: Diagnosis not present

## 2016-06-23 DIAGNOSIS — I120 Hypertensive chronic kidney disease with stage 5 chronic kidney disease or end stage renal disease: Secondary | ICD-10-CM | POA: Diagnosis not present

## 2016-06-23 DIAGNOSIS — L97311 Non-pressure chronic ulcer of right ankle limited to breakdown of skin: Secondary | ICD-10-CM | POA: Diagnosis not present

## 2016-06-23 LAB — BMP8+ANION GAP
ANION GAP: 15 mmol/L (ref 10.0–18.0)
BUN/Creatinine Ratio: 11 (ref 10–24)
BUN: 24 mg/dL (ref 8–27)
CALCIUM: 9.6 mg/dL (ref 8.6–10.2)
CO2: 23 mmol/L (ref 18–29)
Chloride: 104 mmol/L (ref 96–106)
Creatinine, Ser: 2.13 mg/dL — ABNORMAL HIGH (ref 0.76–1.27)
GFR calc Af Amer: 31 mL/min/{1.73_m2} — ABNORMAL LOW (ref >59)
GFR calc non Af Amer: 27 mL/min/{1.73_m2} — ABNORMAL LOW (ref >59)
GLUCOSE: 82 mg/dL (ref 65–99)
Potassium: 5.2 mmol/L (ref 3.5–5.2)
SODIUM: 142 mmol/L (ref 134–144)

## 2016-06-23 LAB — CBC
HEMATOCRIT: 29.5 % — AB (ref 37.5–51.0)
Hemoglobin: 9.4 g/dL — ABNORMAL LOW (ref 12.6–17.7)
MCH: 26.4 pg — ABNORMAL LOW (ref 26.6–33.0)
MCHC: 31.9 g/dL (ref 31.5–35.7)
MCV: 83 fL (ref 79–97)
PLATELETS: 341 10*3/uL (ref 150–379)
RBC: 3.56 x10E6/uL — ABNORMAL LOW (ref 4.14–5.80)
RDW: 16.3 % — AB (ref 12.3–15.4)
WBC: 10.2 10*3/uL (ref 3.4–10.8)

## 2016-06-23 LAB — URIC ACID: URIC ACID: 4.1 mg/dL (ref 3.7–8.6)

## 2016-06-24 MED ORDER — FUROSEMIDE 40 MG PO TABS: 40.0000 mg | ORAL_TABLET | Freq: Every day | ORAL | 3 refills | Status: DC

## 2016-06-24 NOTE — Assessment & Plan Note (Signed)
A: Patient has followed up with urology and no longer has Foley catheter.  He is not having any urinary symptoms at this point.  His alfuzosin was discontinued by Urology and he is now on Finasteride 5mg  daily.  P: - continue Finasteride - renal ultrasound ordered to ensure no obstructive process contributing to his worsening CKD.

## 2016-06-24 NOTE — Assessment & Plan Note (Signed)
A: He is having worsening lower extremity edema per his wife which is negatively affecting his chronic feet wounds.    P: - initial plan was to increase Lasix to 40mg  BID from daily.  However, given his worsening renal function, we will continue with Lasix 40mg  daily.  Will also discontinue his potassium supplementation given high-normal on BMET. - given his history of urinary retention, will order a renal ultrasound to ensure no obstructive process.  Can also consider bladder scan at follow up visit if patient has not done ultrasound by that time. - attempted to call patient and let him know of changes but had to leave voice message.  Will attempt to re-contact on Monday 8/7. - will see patient back in 2 weeks for repeat BMET.

## 2016-06-24 NOTE — Assessment & Plan Note (Addendum)
A: Patient was hospitalized in May 2016.  Seen in consult at that time by Dr. Lajoyce Corners who recommended transtibial amputations, however, patient and wife would not consent.  ABIs performed showing mild reduction in arterial flow on the right and moderate reduction in arterial flow on the left.  He was hospitalized again earlier this year and was evaluated by Dr. Lajoyce Corners and Dr. Magnus Ivan as second opinion for the extensive ulceration of his left foot. They both recommended amputation. Patient and his wife again did not want to pursue that option. Our inpatient team had exntensive discussion with them and recommended surgery as the appropriate treatment for this extensive gangrenes.  Patient opted to try IV antibiotic instead of amputation.  His wife today reports that wounds had been doing better until recently when she feels they have "opened back up" again and she would like to check some labs to make sure he does not have an infection.  He has a home health nurse who comes out a couple times per week to assess wounds and she does dressing changes on other days.  He has not had any systemic symptoms of infection.  No fever, chills, nausea, vomiting, altered mental status.    P: - see pictures from office visit 02/15/16 to get a sense of how extensive his wounds are.  This is a difficult situation as he and his wife are declining surgery which is likely the only way to appropriately deal with his wounds.   - CBC did not indicate infection. - his worsening lower extremity edema and chronic venous stasis are hindering his wound healing as is his protein-malnutrition. - our initial plan was to increase his Lasix and reassess in 1 month to see if his edema was improving, however, given his worsening renal function on BMET, I do not think increasing his Lasix is an option right now and may not be effective in taking off this edema that has obviously been present for a long time. - I will attempt to send patient for Unna  boots to see if this will help his edema and promote some wound healing.  I will also refer him to the [facility] for more appropriate treatment.  In the past, his wife has declined this but I am hopeful she will be willing to take him there. - Will try to see patient back within the next couple weeks.  I have asked our front office staff to attempt to schedule an appointment. - I attempted to call patient and discuss the plan on 06/24/16 at 1pm but had to leave a voice message.  Will attempt to contact again on Monday 06/26/16.

## 2016-06-24 NOTE — Assessment & Plan Note (Signed)
A: Rate is regular today, rhythm irregular.  Denies any chest pain.  His wife reiterates that she does not want him to be on anticoagulation.  She also does not want him to be on a rate controlling medication such as beta blocker.  He continues to take aspirin 81mg  daily, which he has been on for many years, per his wife.  P: - continue with Aspirin 81mg  daily.

## 2016-06-24 NOTE — Assessment & Plan Note (Signed)
A: His uric acid last visit was 8.1 on allopurinol 100mg  daily.  His dose was increased to 200mg  daily.  He has been adherent to this dose.  P: - uric acid level checked and found to be 4.1 - continue current management as uric acid at therapeutic level.

## 2016-06-26 ENCOUNTER — Telehealth: Payer: Self-pay | Admitting: *Deleted

## 2016-06-26 NOTE — Telephone Encounter (Signed)
Wife, Okey RegalCarol, states she was told to call Dr. Earlene PlaterWallace for lab results and change in medication. Please call Okey RegalCarol at her office number after 10:00 [phone]. Thanks!

## 2016-06-30 DIAGNOSIS — I70263 Atherosclerosis of native arteries of extremities with gangrene, bilateral legs: Secondary | ICD-10-CM | POA: Diagnosis not present

## 2016-06-30 DIAGNOSIS — M86472 Chronic osteomyelitis with draining sinus, left ankle and foot: Secondary | ICD-10-CM | POA: Diagnosis not present

## 2016-06-30 DIAGNOSIS — T8753 Necrosis of amputation stump, right lower extremity: Secondary | ICD-10-CM | POA: Diagnosis not present

## 2016-06-30 DIAGNOSIS — I120 Hypertensive chronic kidney disease with stage 5 chronic kidney disease or end stage renal disease: Secondary | ICD-10-CM | POA: Diagnosis not present

## 2016-06-30 DIAGNOSIS — L97311 Non-pressure chronic ulcer of right ankle limited to breakdown of skin: Secondary | ICD-10-CM | POA: Diagnosis not present

## 2016-06-30 DIAGNOSIS — N183 Chronic kidney disease, stage 3 (moderate): Secondary | ICD-10-CM | POA: Diagnosis not present

## 2016-07-03 ENCOUNTER — Ambulatory Visit (HOSPITAL_COMMUNITY): Payer: Medicare Other

## 2016-07-03 NOTE — Progress Notes (Signed)
I saw and evaluated the patient.  I personally confirmed the key portions of Dr. Wallace's history and exam and reviewed pertinent patient test results.  The assessment, diagnosis, and plan were formulated together and I agree with the documentation in the resident's note. 

## 2016-07-07 DIAGNOSIS — T8753 Necrosis of amputation stump, right lower extremity: Secondary | ICD-10-CM | POA: Diagnosis not present

## 2016-07-07 DIAGNOSIS — I70263 Atherosclerosis of native arteries of extremities with gangrene, bilateral legs: Secondary | ICD-10-CM | POA: Diagnosis not present

## 2016-07-07 DIAGNOSIS — I120 Hypertensive chronic kidney disease with stage 5 chronic kidney disease or end stage renal disease: Secondary | ICD-10-CM | POA: Diagnosis not present

## 2016-07-07 DIAGNOSIS — N183 Chronic kidney disease, stage 3 (moderate): Secondary | ICD-10-CM | POA: Diagnosis not present

## 2016-07-07 DIAGNOSIS — M86472 Chronic osteomyelitis with draining sinus, left ankle and foot: Secondary | ICD-10-CM | POA: Diagnosis not present

## 2016-07-07 DIAGNOSIS — L97311 Non-pressure chronic ulcer of right ankle limited to breakdown of skin: Secondary | ICD-10-CM | POA: Diagnosis not present

## 2016-07-13 ENCOUNTER — Encounter: Payer: Self-pay | Admitting: Internal Medicine

## 2016-07-13 ENCOUNTER — Ambulatory Visit (INDEPENDENT_AMBULATORY_CARE_PROVIDER_SITE_OTHER): Payer: Medicare Other | Admitting: Internal Medicine

## 2016-07-13 VITALS — BP 176/76 | HR 76 | Temp 98.2°F | Ht 71.5 in | Wt 173.0 lb

## 2016-07-13 DIAGNOSIS — N183 Chronic kidney disease, stage 3 unspecified: Secondary | ICD-10-CM

## 2016-07-13 DIAGNOSIS — I70263 Atherosclerosis of native arteries of extremities with gangrene, bilateral legs: Secondary | ICD-10-CM | POA: Diagnosis not present

## 2016-07-13 DIAGNOSIS — Z87891 Personal history of nicotine dependence: Secondary | ICD-10-CM | POA: Diagnosis not present

## 2016-07-13 DIAGNOSIS — I70269 Atherosclerosis of native arteries of extremities with gangrene, unspecified extremity: Secondary | ICD-10-CM

## 2016-07-13 NOTE — Patient Instructions (Signed)
Thank you for coming to see me today. It was a pleasure. Today we talked about:   Lasix dosing: continue to take 40mg  daily.  You can also take an extra dose as needed if you notice worsening swelling.  Please call us if you notice any changes to the wounds in terms of more drainage or odor, fevers, chills, nausea or vomiting.  I will re-order your home health nursing.  Please follow-up with me in 3 months.  If you have any questions or concerns, please do not hesitate to call the office at [phone].  Take Care,   Gwynn BurlyAndrew Tarshia Kot, DO

## 2016-07-13 NOTE — Progress Notes (Signed)
CC: here for follow up of chronic wounds and chronic kidney disease  HPI:  [name] Miranda is a 82 y.o. man with a past medical history listed below here today for follow up of his chronic wounds and CKD.   For details of today's visit and the status of his chronic medical issues please refer to the assessment and plan.   Past Medical History:  Diagnosis Date  . Acute blood loss anemia 10/18/2013  . Anemia of chronic disease   . Chronic kidney disease   . Gout   . History of blood transfusion   . Hypertension     Review of Systems:   Review of Systems  Constitutional: Negative for chills and fever.  Cardiovascular: Positive for leg swelling.  Genitourinary: Negative for dysuria and urgency.       Negative for retention.  Musculoskeletal: Negative for falls.  Skin:       Positive for chronic wounds.     Physical Exam:  Vitals:   07/13/16 1502  BP: (!) 176/76  Pulse: 76  Temp: 98.2 F (36.8 C)  TempSrc: Oral  SpO2: 92%  Weight: 173 lb (78.5 kg)  Height: 5' 11.5" (1.816 m)   Physical Exam  Constitutional: He is oriented to person, place, and time.  Elderly appearing man, accompanied by his wife.  HENT:  Head: Normocephalic and atraumatic.  Musculoskeletal: He exhibits edema.  Neurological: He is alert and oriented to person, place, and time.  Skin:  No epibole on any wounds noted Left inner foot 14cm wide x5.5cm long by 1cm deep wound, pink edges, pink bed areas approx 60% covered with slough-like material Left outer foot 7 cm wide x 4.5 cm long with same pink edges, pink bed also approx 60 % covered in slough-like material Left inner ankle: 2cmx2cm area, pink edges, pink bed 50% covered with slough-like material Right inner ankle 9cm x 9cm, pink edges, pink bed- 60% covered in slough-like material Right outer foot, 7 cm wide x 6 cm long x 0.5 cm deep at distal end - pink edges, pink bed, approx 50% covered with slough-like material Right foot right  top aspect - 4 cm long x 3cm wide, 100% covered in slough-like material, pink edges Right foot left top aspect - 2 cm wide by 1 cm long - pink edges, pink bed 50 % covered in slough-like material  Psychiatric: Mood and affect normal.        Assessment & Plan:   See Encounters Tab for problem based charting.  Patient seen with Dr. Oswaldo DoneVincent   CKD (chronic kidney disease), stage III A: His wife reports that his volume has been better recently with improved lower extremity edema on continued Lasix 40mg  daily.  His renal function had deteriorated at last visit from April 2017 and so we kept Lasix at the current dose of 40mg  daily as opposed to increasing to BID dosing.   P: - BMET today shows stable and improved kidney function with stable potassium off his supplementation. - will continue with current Lasix dosing of 40mg  daily and instructed patient that he can take an extra dose as needed for symptomatic edema. - patient yet to complete renal ultrasound that was ordered last visit, but with improved renal function, likely can cancel this.  Wife also reports no issues with urination at this time.  Atherosclerotic peripheral vascular disease with gangrene (HCC) A: Patient continues to deal with chronic wounds as a result of his PVD.  Patient feels  hopeful that the wounds will heal, however, I think at this point we are providing more palliative care of his chronic wounds.  I do not foresee his wounds completely healing from a multifactorial standpoint of his PVD, venous stasis, edema, and protein-calorie malnutrition.  Patient wife has refused Wound Care appointment scheduled after last visit and it is unlikely that he will benefit from Unna boots anyhow at this point.  He and his wife would like to continue with home health services.  P: - continue Lasix for edema - Vincent Miranda Rank, RN provided wound care and recommendations today - will re-order home health services to provide wound care  for patient

## 2016-07-13 NOTE — Progress Notes (Signed)
Wound care: No epibole on any wounds noted Left inner foot 14cm wide x5.5cm long by 1cm deep wound, pink edges, pink bed areas approx 60% covered with slough-like material Left outer foot 7 cm wide x 4.5 cm long with same pink edges, pink bed also approx 60 % covered in slough-like material Left inner ankle: 2cmx2cm area, pink edges, pink bed 50% covered with slough-like material Right inner ankle 9cm x 9cm, pink edges, pink bed- 60% covered in slough-like material Right outer foot, 7 cm wide x 6 cm long x 0.5 cm deep at distal end - pink edges, pink bed, approx 50% covered with slough-like material Right foot right top aspect - 4 cm long x 3cm wide, 100% covered in slough-like material, pink edges Right foot left top aspect - 2 cm wide by 1 cm long - pink edges, pink bed 50 % covered in slough-like material All areas cleansed with Normal saline, dried, and Calcium alginate applied next to wounds which were all actively draining except the small area covered by 100% slough. Silicone gel adhesive hydrocellular foam dressings used to cover all calcium alginate dressings, 3" kling stretch gauze used to wrap from toes to area above the ankles to hold hydrocellular foam dressings in place and reinforce dressing protection. Pt and wife instructed outer wrap could be changed if needed and extra stretch gauze provided but to leave the calcium alginate and hydrocellular foam dressings in place for 7 days. Dr. Earlene PlaterWallace to send similar instructions to home health for follow up wound care. He noted no evidence of infection or need for antibiotic treated dressings at this time. Dorie RankSharon Geordan Xu, RN, 07/13/16, 5;28 pm

## 2016-07-14 LAB — BMP8+ANION GAP
ANION GAP: 13 mmol/L (ref 10.0–18.0)
BUN / CREAT RATIO: 12 (ref 10–24)
BUN: 20 mg/dL (ref 8–27)
CHLORIDE: 103 mmol/L (ref 96–106)
CO2: 25 mmol/L (ref 18–29)
CREATININE: 1.69 mg/dL — AB (ref 0.76–1.27)
Calcium: 9.6 mg/dL (ref 8.6–10.2)
GFR calc Af Amer: 41 mL/min/{1.73_m2} — ABNORMAL LOW (ref >59)
GFR calc non Af Amer: 35 mL/min/{1.73_m2} — ABNORMAL LOW (ref >59)
GLUCOSE: 104 mg/dL — AB (ref 65–99)
Potassium: 4.3 mmol/L (ref 3.5–5.2)
SODIUM: 141 mmol/L (ref 134–144)

## 2016-07-14 NOTE — Assessment & Plan Note (Signed)
A: Patient continues to deal with chronic wounds as a result of his PVD.  Patient feels hopeful that the wounds will heal, however, I think at this point we are providing more palliative care of his chronic wounds.  I do not foresee his wounds completely healing from a multifactorial standpoint of his PVD, venous stasis, edema, and protein-calorie malnutrition.  Patient wife has refused Wound Care appointment scheduled after last visit and it is unlikely that he will benefit from Unna boots anyhow at this point.  He and his wife would like to continue with home health services.  P: - continue Lasix for edema - Dorie RankSharon Powers, RN provided wound care and recommendations today - will re-order home health services to provide wound care for patient

## 2016-07-14 NOTE — Addendum Note (Signed)
Addended by: Kathlynn GrateWALLACE, Jannae Fagerstrom N on: 07/14/2016 02:15 PM   Modules accepted: Orders

## 2016-07-14 NOTE — Assessment & Plan Note (Signed)
A: His wife reports that his volume has been better recently with improved lower extremity edema on continued Lasix 40mg  daily.  His renal function had deteriorated at last visit from April 2017 and so we kept Lasix at the current dose of 40mg  daily as opposed to increasing to BID dosing.   P: - BMET today shows stable and improved kidney function with stable potassium off his supplementation. - will continue with current Lasix dosing of 40mg  daily and instructed patient that he can take an extra dose as needed for symptomatic edema. - patient yet to complete renal ultrasound that was ordered last visit, but with improved renal function, likely can cancel this.  Wife also reports no issues with urination at this time.

## 2016-07-17 ENCOUNTER — Ambulatory Visit (HOSPITAL_BASED_OUTPATIENT_CLINIC_OR_DEPARTMENT_OTHER): Payer: Medicare Other

## 2016-07-17 NOTE — Progress Notes (Signed)
Internal Medicine Clinic Attending  I saw and evaluated the patient.  I personally confirmed the key portions of the history and exam documented by Dr. Earlene PlaterWallace and I reviewed pertinent patient test results.  The assessment, diagnosis, and plan were formulated together and I agree with the documentation in the resident's note.  We are treating his extremity wounds paliatively. He has received at least two opinions from ortho that the feet are non-viable due to PAD, that the wounds will not heal, that the wounds track down to the bones. The patient is happy at home, wife is satisfied with current state of the wounds, and they continue to decline amputation. We will continue to order home RN dressing changes, and we can see him in our clinic as frequently as needed. These will inevitably become re-infected and we can try broad oral antibiotics to maximize his time at home, but I anticipate he will always be high risk for needing inpatient admission.

## 2016-07-18 ENCOUNTER — Telehealth: Payer: Self-pay | Admitting: Licensed Clinical Social Worker

## 2016-07-18 NOTE — Telephone Encounter (Signed)
[name] [facility]  CSW placed call to pt to obtain agency of choice.  Pt states he has had HH in the past.  [facility], [facility] confirms and [facility]  [name] states he has recently had a death in the family and is unsure about his schedule.  CSW informed pt agency will contact prior to coming to the home.  [name] prefers agency to contact his spouse for scheduling.  Pt states spouses can be contacted at work phone number.  [facility]

## 2016-07-19 ENCOUNTER — Telehealth: Payer: Self-pay | Admitting: Internal Medicine

## 2016-07-19 DIAGNOSIS — I129 Hypertensive chronic kidney disease with stage 1 through stage 4 chronic kidney disease, or unspecified chronic kidney disease: Secondary | ICD-10-CM | POA: Diagnosis not present

## 2016-07-19 DIAGNOSIS — M109 Gout, unspecified: Secondary | ICD-10-CM | POA: Diagnosis not present

## 2016-07-19 DIAGNOSIS — L97321 Non-pressure chronic ulcer of left ankle limited to breakdown of skin: Secondary | ICD-10-CM | POA: Diagnosis not present

## 2016-07-19 DIAGNOSIS — N189 Chronic kidney disease, unspecified: Secondary | ICD-10-CM | POA: Diagnosis not present

## 2016-07-19 DIAGNOSIS — I739 Peripheral vascular disease, unspecified: Secondary | ICD-10-CM | POA: Diagnosis not present

## 2016-07-19 DIAGNOSIS — Z7982 Long term (current) use of aspirin: Secondary | ICD-10-CM | POA: Diagnosis not present

## 2016-07-19 DIAGNOSIS — L97421 Non-pressure chronic ulcer of left heel and midfoot limited to breakdown of skin: Secondary | ICD-10-CM | POA: Diagnosis not present

## 2016-07-19 DIAGNOSIS — L97411 Non-pressure chronic ulcer of right heel and midfoot limited to breakdown of skin: Secondary | ICD-10-CM | POA: Diagnosis not present

## 2016-07-19 DIAGNOSIS — L97311 Non-pressure chronic ulcer of right ankle limited to breakdown of skin: Secondary | ICD-10-CM | POA: Diagnosis not present

## 2016-07-19 NOTE — Telephone Encounter (Signed)
Calling to get clarification on wound care orders

## 2016-07-19 NOTE — Telephone Encounter (Signed)
Call return- no answer; message left.

## 2016-07-20 ENCOUNTER — Telehealth: Payer: Self-pay | Admitting: Internal Medicine

## 2016-07-20 NOTE — Telephone Encounter (Signed)
Sounds good to me. Thanks!

## 2016-07-20 NOTE — Telephone Encounter (Signed)
Would like to change wound care orders

## 2016-07-20 NOTE — Telephone Encounter (Signed)
Return call to [place], [facility] - Wants to change wound care orders if ok with the doctor to: Silver Nitrate to hypergranulation and rolled edges; Alginate to open areas; and Wrap with a light 4 layer wrap w/foam 4 times a week. Verbal ok given if not appropriate, let me know.

## 2016-07-21 DIAGNOSIS — L97411 Non-pressure chronic ulcer of right heel and midfoot limited to breakdown of skin: Secondary | ICD-10-CM | POA: Diagnosis not present

## 2016-07-21 DIAGNOSIS — I129 Hypertensive chronic kidney disease with stage 1 through stage 4 chronic kidney disease, or unspecified chronic kidney disease: Secondary | ICD-10-CM | POA: Diagnosis not present

## 2016-07-21 DIAGNOSIS — L97421 Non-pressure chronic ulcer of left heel and midfoot limited to breakdown of skin: Secondary | ICD-10-CM | POA: Diagnosis not present

## 2016-07-21 DIAGNOSIS — I739 Peripheral vascular disease, unspecified: Secondary | ICD-10-CM | POA: Diagnosis not present

## 2016-07-21 DIAGNOSIS — L97321 Non-pressure chronic ulcer of left ankle limited to breakdown of skin: Secondary | ICD-10-CM | POA: Diagnosis not present

## 2016-07-21 DIAGNOSIS — L97311 Non-pressure chronic ulcer of right ankle limited to breakdown of skin: Secondary | ICD-10-CM | POA: Diagnosis not present

## 2016-07-24 DIAGNOSIS — I129 Hypertensive chronic kidney disease with stage 1 through stage 4 chronic kidney disease, or unspecified chronic kidney disease: Secondary | ICD-10-CM | POA: Diagnosis not present

## 2016-07-24 DIAGNOSIS — L97421 Non-pressure chronic ulcer of left heel and midfoot limited to breakdown of skin: Secondary | ICD-10-CM | POA: Diagnosis not present

## 2016-07-24 DIAGNOSIS — L97321 Non-pressure chronic ulcer of left ankle limited to breakdown of skin: Secondary | ICD-10-CM | POA: Diagnosis not present

## 2016-07-24 DIAGNOSIS — L97311 Non-pressure chronic ulcer of right ankle limited to breakdown of skin: Secondary | ICD-10-CM | POA: Diagnosis not present

## 2016-07-24 DIAGNOSIS — L97411 Non-pressure chronic ulcer of right heel and midfoot limited to breakdown of skin: Secondary | ICD-10-CM | POA: Diagnosis not present

## 2016-07-24 DIAGNOSIS — I739 Peripheral vascular disease, unspecified: Secondary | ICD-10-CM | POA: Diagnosis not present

## 2016-07-27 ENCOUNTER — Encounter: Payer: Medicare Other | Admitting: Internal Medicine

## 2016-07-27 DIAGNOSIS — I739 Peripheral vascular disease, unspecified: Secondary | ICD-10-CM | POA: Diagnosis not present

## 2016-07-27 DIAGNOSIS — L97421 Non-pressure chronic ulcer of left heel and midfoot limited to breakdown of skin: Secondary | ICD-10-CM | POA: Diagnosis not present

## 2016-07-27 DIAGNOSIS — L97321 Non-pressure chronic ulcer of left ankle limited to breakdown of skin: Secondary | ICD-10-CM | POA: Diagnosis not present

## 2016-07-27 DIAGNOSIS — L97411 Non-pressure chronic ulcer of right heel and midfoot limited to breakdown of skin: Secondary | ICD-10-CM | POA: Diagnosis not present

## 2016-07-27 DIAGNOSIS — I129 Hypertensive chronic kidney disease with stage 1 through stage 4 chronic kidney disease, or unspecified chronic kidney disease: Secondary | ICD-10-CM | POA: Diagnosis not present

## 2016-07-27 DIAGNOSIS — L97311 Non-pressure chronic ulcer of right ankle limited to breakdown of skin: Secondary | ICD-10-CM | POA: Diagnosis not present

## 2016-07-31 DIAGNOSIS — L97411 Non-pressure chronic ulcer of right heel and midfoot limited to breakdown of skin: Secondary | ICD-10-CM | POA: Diagnosis not present

## 2016-07-31 DIAGNOSIS — L97321 Non-pressure chronic ulcer of left ankle limited to breakdown of skin: Secondary | ICD-10-CM | POA: Diagnosis not present

## 2016-07-31 DIAGNOSIS — L97421 Non-pressure chronic ulcer of left heel and midfoot limited to breakdown of skin: Secondary | ICD-10-CM | POA: Diagnosis not present

## 2016-07-31 DIAGNOSIS — I739 Peripheral vascular disease, unspecified: Secondary | ICD-10-CM | POA: Diagnosis not present

## 2016-07-31 DIAGNOSIS — L97311 Non-pressure chronic ulcer of right ankle limited to breakdown of skin: Secondary | ICD-10-CM | POA: Diagnosis not present

## 2016-07-31 DIAGNOSIS — I129 Hypertensive chronic kidney disease with stage 1 through stage 4 chronic kidney disease, or unspecified chronic kidney disease: Secondary | ICD-10-CM | POA: Diagnosis not present

## 2016-08-03 DIAGNOSIS — L97311 Non-pressure chronic ulcer of right ankle limited to breakdown of skin: Secondary | ICD-10-CM | POA: Diagnosis not present

## 2016-08-03 DIAGNOSIS — L97421 Non-pressure chronic ulcer of left heel and midfoot limited to breakdown of skin: Secondary | ICD-10-CM | POA: Diagnosis not present

## 2016-08-03 DIAGNOSIS — L97411 Non-pressure chronic ulcer of right heel and midfoot limited to breakdown of skin: Secondary | ICD-10-CM | POA: Diagnosis not present

## 2016-08-03 DIAGNOSIS — L97321 Non-pressure chronic ulcer of left ankle limited to breakdown of skin: Secondary | ICD-10-CM | POA: Diagnosis not present

## 2016-08-03 DIAGNOSIS — I129 Hypertensive chronic kidney disease with stage 1 through stage 4 chronic kidney disease, or unspecified chronic kidney disease: Secondary | ICD-10-CM | POA: Diagnosis not present

## 2016-08-03 DIAGNOSIS — I739 Peripheral vascular disease, unspecified: Secondary | ICD-10-CM | POA: Diagnosis not present

## 2016-08-07 ENCOUNTER — Telehealth: Payer: Self-pay | Admitting: Internal Medicine

## 2016-08-07 DIAGNOSIS — I129 Hypertensive chronic kidney disease with stage 1 through stage 4 chronic kidney disease, or unspecified chronic kidney disease: Secondary | ICD-10-CM | POA: Diagnosis not present

## 2016-08-07 DIAGNOSIS — L97411 Non-pressure chronic ulcer of right heel and midfoot limited to breakdown of skin: Secondary | ICD-10-CM | POA: Diagnosis not present

## 2016-08-07 DIAGNOSIS — L97321 Non-pressure chronic ulcer of left ankle limited to breakdown of skin: Secondary | ICD-10-CM | POA: Diagnosis not present

## 2016-08-07 DIAGNOSIS — I739 Peripheral vascular disease, unspecified: Secondary | ICD-10-CM | POA: Diagnosis not present

## 2016-08-07 DIAGNOSIS — L97421 Non-pressure chronic ulcer of left heel and midfoot limited to breakdown of skin: Secondary | ICD-10-CM | POA: Diagnosis not present

## 2016-08-07 DIAGNOSIS — L97311 Non-pressure chronic ulcer of right ankle limited to breakdown of skin: Secondary | ICD-10-CM | POA: Diagnosis not present

## 2016-08-07 NOTE — Telephone Encounter (Signed)
Needs verbal order to say its okay to continue for 2x week wound care

## 2016-08-07 NOTE — Telephone Encounter (Signed)
Return [name]'s call - no answer; left message.

## 2016-08-09 NOTE — Telephone Encounter (Signed)
Returned [name] - states pt's wounds are not healing; needs order for "Wound care twice a week".  Verbal order given - if not appropriate, let me know. Thanks

## 2016-08-09 NOTE — Telephone Encounter (Signed)
Yes, thank you.  His wounds are incredibly difficult to manage.

## 2016-08-10 DIAGNOSIS — L97421 Non-pressure chronic ulcer of left heel and midfoot limited to breakdown of skin: Secondary | ICD-10-CM | POA: Diagnosis not present

## 2016-08-10 DIAGNOSIS — I739 Peripheral vascular disease, unspecified: Secondary | ICD-10-CM | POA: Diagnosis not present

## 2016-08-10 DIAGNOSIS — L97321 Non-pressure chronic ulcer of left ankle limited to breakdown of skin: Secondary | ICD-10-CM | POA: Diagnosis not present

## 2016-08-10 DIAGNOSIS — L97311 Non-pressure chronic ulcer of right ankle limited to breakdown of skin: Secondary | ICD-10-CM | POA: Diagnosis not present

## 2016-08-10 DIAGNOSIS — L97411 Non-pressure chronic ulcer of right heel and midfoot limited to breakdown of skin: Secondary | ICD-10-CM | POA: Diagnosis not present

## 2016-08-10 DIAGNOSIS — I129 Hypertensive chronic kidney disease with stage 1 through stage 4 chronic kidney disease, or unspecified chronic kidney disease: Secondary | ICD-10-CM | POA: Diagnosis not present

## 2016-08-14 DIAGNOSIS — L97321 Non-pressure chronic ulcer of left ankle limited to breakdown of skin: Secondary | ICD-10-CM | POA: Diagnosis not present

## 2016-08-14 DIAGNOSIS — I739 Peripheral vascular disease, unspecified: Secondary | ICD-10-CM | POA: Diagnosis not present

## 2016-08-14 DIAGNOSIS — L97411 Non-pressure chronic ulcer of right heel and midfoot limited to breakdown of skin: Secondary | ICD-10-CM | POA: Diagnosis not present

## 2016-08-14 DIAGNOSIS — L97311 Non-pressure chronic ulcer of right ankle limited to breakdown of skin: Secondary | ICD-10-CM | POA: Diagnosis not present

## 2016-08-14 DIAGNOSIS — L97421 Non-pressure chronic ulcer of left heel and midfoot limited to breakdown of skin: Secondary | ICD-10-CM | POA: Diagnosis not present

## 2016-08-14 DIAGNOSIS — I129 Hypertensive chronic kidney disease with stage 1 through stage 4 chronic kidney disease, or unspecified chronic kidney disease: Secondary | ICD-10-CM | POA: Diagnosis not present

## 2016-08-17 DIAGNOSIS — I129 Hypertensive chronic kidney disease with stage 1 through stage 4 chronic kidney disease, or unspecified chronic kidney disease: Secondary | ICD-10-CM | POA: Diagnosis not present

## 2016-08-17 DIAGNOSIS — I739 Peripheral vascular disease, unspecified: Secondary | ICD-10-CM | POA: Diagnosis not present

## 2016-08-17 DIAGNOSIS — L97411 Non-pressure chronic ulcer of right heel and midfoot limited to breakdown of skin: Secondary | ICD-10-CM | POA: Diagnosis not present

## 2016-08-17 DIAGNOSIS — L97311 Non-pressure chronic ulcer of right ankle limited to breakdown of skin: Secondary | ICD-10-CM | POA: Diagnosis not present

## 2016-08-17 DIAGNOSIS — L97421 Non-pressure chronic ulcer of left heel and midfoot limited to breakdown of skin: Secondary | ICD-10-CM | POA: Diagnosis not present

## 2016-08-17 DIAGNOSIS — L97321 Non-pressure chronic ulcer of left ankle limited to breakdown of skin: Secondary | ICD-10-CM | POA: Diagnosis not present

## 2016-08-21 DIAGNOSIS — L97321 Non-pressure chronic ulcer of left ankle limited to breakdown of skin: Secondary | ICD-10-CM | POA: Diagnosis not present

## 2016-08-21 DIAGNOSIS — L97311 Non-pressure chronic ulcer of right ankle limited to breakdown of skin: Secondary | ICD-10-CM | POA: Diagnosis not present

## 2016-08-21 DIAGNOSIS — L97421 Non-pressure chronic ulcer of left heel and midfoot limited to breakdown of skin: Secondary | ICD-10-CM | POA: Diagnosis not present

## 2016-08-21 DIAGNOSIS — I739 Peripheral vascular disease, unspecified: Secondary | ICD-10-CM | POA: Diagnosis not present

## 2016-08-21 DIAGNOSIS — I129 Hypertensive chronic kidney disease with stage 1 through stage 4 chronic kidney disease, or unspecified chronic kidney disease: Secondary | ICD-10-CM | POA: Diagnosis not present

## 2016-08-21 DIAGNOSIS — L97411 Non-pressure chronic ulcer of right heel and midfoot limited to breakdown of skin: Secondary | ICD-10-CM | POA: Diagnosis not present

## 2016-08-24 DIAGNOSIS — I739 Peripheral vascular disease, unspecified: Secondary | ICD-10-CM | POA: Diagnosis not present

## 2016-08-24 DIAGNOSIS — L97421 Non-pressure chronic ulcer of left heel and midfoot limited to breakdown of skin: Secondary | ICD-10-CM | POA: Diagnosis not present

## 2016-08-24 DIAGNOSIS — I129 Hypertensive chronic kidney disease with stage 1 through stage 4 chronic kidney disease, or unspecified chronic kidney disease: Secondary | ICD-10-CM | POA: Diagnosis not present

## 2016-08-24 DIAGNOSIS — L97311 Non-pressure chronic ulcer of right ankle limited to breakdown of skin: Secondary | ICD-10-CM | POA: Diagnosis not present

## 2016-08-24 DIAGNOSIS — L97321 Non-pressure chronic ulcer of left ankle limited to breakdown of skin: Secondary | ICD-10-CM | POA: Diagnosis not present

## 2016-08-24 DIAGNOSIS — L97411 Non-pressure chronic ulcer of right heel and midfoot limited to breakdown of skin: Secondary | ICD-10-CM | POA: Diagnosis not present

## 2016-08-28 DIAGNOSIS — I739 Peripheral vascular disease, unspecified: Secondary | ICD-10-CM | POA: Diagnosis not present

## 2016-08-28 DIAGNOSIS — L97421 Non-pressure chronic ulcer of left heel and midfoot limited to breakdown of skin: Secondary | ICD-10-CM | POA: Diagnosis not present

## 2016-08-28 DIAGNOSIS — L97321 Non-pressure chronic ulcer of left ankle limited to breakdown of skin: Secondary | ICD-10-CM | POA: Diagnosis not present

## 2016-08-28 DIAGNOSIS — L97411 Non-pressure chronic ulcer of right heel and midfoot limited to breakdown of skin: Secondary | ICD-10-CM | POA: Diagnosis not present

## 2016-08-28 DIAGNOSIS — L97311 Non-pressure chronic ulcer of right ankle limited to breakdown of skin: Secondary | ICD-10-CM | POA: Diagnosis not present

## 2016-08-28 DIAGNOSIS — I129 Hypertensive chronic kidney disease with stage 1 through stage 4 chronic kidney disease, or unspecified chronic kidney disease: Secondary | ICD-10-CM | POA: Diagnosis not present

## 2016-08-28 NOTE — Addendum Note (Signed)
Addended by: Neomia DearPOWERS, Hadden Steig E on: 08/28/2016 09:45 PM   Modules accepted: Orders

## 2016-08-31 DIAGNOSIS — L97321 Non-pressure chronic ulcer of left ankle limited to breakdown of skin: Secondary | ICD-10-CM | POA: Diagnosis not present

## 2016-08-31 DIAGNOSIS — I129 Hypertensive chronic kidney disease with stage 1 through stage 4 chronic kidney disease, or unspecified chronic kidney disease: Secondary | ICD-10-CM | POA: Diagnosis not present

## 2016-08-31 DIAGNOSIS — I739 Peripheral vascular disease, unspecified: Secondary | ICD-10-CM | POA: Diagnosis not present

## 2016-08-31 DIAGNOSIS — L97421 Non-pressure chronic ulcer of left heel and midfoot limited to breakdown of skin: Secondary | ICD-10-CM | POA: Diagnosis not present

## 2016-08-31 DIAGNOSIS — L97311 Non-pressure chronic ulcer of right ankle limited to breakdown of skin: Secondary | ICD-10-CM | POA: Diagnosis not present

## 2016-08-31 DIAGNOSIS — L97411 Non-pressure chronic ulcer of right heel and midfoot limited to breakdown of skin: Secondary | ICD-10-CM | POA: Diagnosis not present

## 2016-09-04 DIAGNOSIS — L97421 Non-pressure chronic ulcer of left heel and midfoot limited to breakdown of skin: Secondary | ICD-10-CM | POA: Diagnosis not present

## 2016-09-04 DIAGNOSIS — I129 Hypertensive chronic kidney disease with stage 1 through stage 4 chronic kidney disease, or unspecified chronic kidney disease: Secondary | ICD-10-CM | POA: Diagnosis not present

## 2016-09-04 DIAGNOSIS — L97311 Non-pressure chronic ulcer of right ankle limited to breakdown of skin: Secondary | ICD-10-CM | POA: Diagnosis not present

## 2016-09-04 DIAGNOSIS — I739 Peripheral vascular disease, unspecified: Secondary | ICD-10-CM | POA: Diagnosis not present

## 2016-09-04 DIAGNOSIS — L97411 Non-pressure chronic ulcer of right heel and midfoot limited to breakdown of skin: Secondary | ICD-10-CM | POA: Diagnosis not present

## 2016-09-04 DIAGNOSIS — L97321 Non-pressure chronic ulcer of left ankle limited to breakdown of skin: Secondary | ICD-10-CM | POA: Diagnosis not present

## 2016-09-07 ENCOUNTER — Encounter (HOSPITAL_COMMUNITY): Payer: Self-pay | Admitting: Emergency Medicine

## 2016-09-07 ENCOUNTER — Inpatient Hospital Stay (HOSPITAL_COMMUNITY)
Admission: EM | Admit: 2016-09-07 | Discharge: 2016-09-10 | DRG: 872 | Disposition: A | Discharge status: Home / Self Care | Payer: Medicare Other | Attending: Internal Medicine | Admitting: Internal Medicine

## 2016-09-07 ENCOUNTER — Telehealth: Payer: Self-pay | Admitting: *Deleted

## 2016-09-07 ENCOUNTER — Emergency Department (HOSPITAL_COMMUNITY): Payer: Medicare Other

## 2016-09-07 DIAGNOSIS — R441 Visual hallucinations: Secondary | ICD-10-CM | POA: Diagnosis not present

## 2016-09-07 DIAGNOSIS — M86661 Other chronic osteomyelitis, right tibia and fibula: Secondary | ICD-10-CM | POA: Diagnosis present

## 2016-09-07 DIAGNOSIS — R21 Rash and other nonspecific skin eruption: Secondary | ICD-10-CM

## 2016-09-07 DIAGNOSIS — N400 Enlarged prostate without lower urinary tract symptoms: Secondary | ICD-10-CM | POA: Diagnosis present

## 2016-09-07 DIAGNOSIS — I878 Other specified disorders of veins: Secondary | ICD-10-CM | POA: Diagnosis present

## 2016-09-07 DIAGNOSIS — R062 Wheezing: Secondary | ICD-10-CM

## 2016-09-07 DIAGNOSIS — I70249 Atherosclerosis of native arteries of left leg with ulceration of unspecified site: Secondary | ICD-10-CM | POA: Diagnosis not present

## 2016-09-07 DIAGNOSIS — E872 Acidosis: Secondary | ICD-10-CM | POA: Diagnosis present

## 2016-09-07 DIAGNOSIS — K219 Gastro-esophageal reflux disease without esophagitis: Secondary | ICD-10-CM | POA: Diagnosis present

## 2016-09-07 DIAGNOSIS — I96 Gangrene, not elsewhere classified: Secondary | ICD-10-CM | POA: Diagnosis present

## 2016-09-07 DIAGNOSIS — N183 Chronic kidney disease, stage 3 unspecified: Secondary | ICD-10-CM | POA: Diagnosis present

## 2016-09-07 DIAGNOSIS — M86662 Other chronic osteomyelitis, left tibia and fibula: Secondary | ICD-10-CM | POA: Diagnosis present

## 2016-09-07 DIAGNOSIS — J449 Chronic obstructive pulmonary disease, unspecified: Secondary | ICD-10-CM | POA: Diagnosis present

## 2016-09-07 DIAGNOSIS — D638 Anemia in other chronic diseases classified elsewhere: Secondary | ICD-10-CM | POA: Diagnosis present

## 2016-09-07 DIAGNOSIS — R0602 Shortness of breath: Secondary | ICD-10-CM

## 2016-09-07 DIAGNOSIS — I129 Hypertensive chronic kidney disease with stage 1 through stage 4 chronic kidney disease, or unspecified chronic kidney disease: Secondary | ICD-10-CM | POA: Diagnosis present

## 2016-09-07 DIAGNOSIS — N179 Acute kidney failure, unspecified: Secondary | ICD-10-CM | POA: Diagnosis present

## 2016-09-07 DIAGNOSIS — I480 Paroxysmal atrial fibrillation: Secondary | ICD-10-CM | POA: Diagnosis present

## 2016-09-07 DIAGNOSIS — L03116 Cellulitis of left lower limb: Secondary | ICD-10-CM

## 2016-09-07 DIAGNOSIS — R509 Fever, unspecified: Secondary | ICD-10-CM | POA: Diagnosis not present

## 2016-09-07 DIAGNOSIS — L97311 Non-pressure chronic ulcer of right ankle limited to breakdown of skin: Secondary | ICD-10-CM | POA: Diagnosis not present

## 2016-09-07 DIAGNOSIS — A419 Sepsis, unspecified organism: Principal | ICD-10-CM | POA: Diagnosis present

## 2016-09-07 DIAGNOSIS — Z87891 Personal history of nicotine dependence: Secondary | ICD-10-CM

## 2016-09-07 DIAGNOSIS — E870 Hyperosmolality and hypernatremia: Secondary | ICD-10-CM | POA: Diagnosis present

## 2016-09-07 DIAGNOSIS — Z79899 Other long term (current) drug therapy: Secondary | ICD-10-CM

## 2016-09-07 DIAGNOSIS — L97411 Non-pressure chronic ulcer of right heel and midfoot limited to breakdown of skin: Secondary | ICD-10-CM | POA: Diagnosis not present

## 2016-09-07 DIAGNOSIS — J9811 Atelectasis: Secondary | ICD-10-CM | POA: Diagnosis present

## 2016-09-07 DIAGNOSIS — L97321 Non-pressure chronic ulcer of left ankle limited to breakdown of skin: Secondary | ICD-10-CM | POA: Diagnosis not present

## 2016-09-07 DIAGNOSIS — Z7982 Long term (current) use of aspirin: Secondary | ICD-10-CM

## 2016-09-07 DIAGNOSIS — L97926 Non-pressure chronic ulcer of unspecified part of left lower leg with bone involvement without evidence of necrosis: Secondary | ICD-10-CM | POA: Diagnosis not present

## 2016-09-07 DIAGNOSIS — I872 Venous insufficiency (chronic) (peripheral): Secondary | ICD-10-CM | POA: Diagnosis present

## 2016-09-07 DIAGNOSIS — M109 Gout, unspecified: Secondary | ICD-10-CM | POA: Diagnosis present

## 2016-09-07 DIAGNOSIS — I10 Essential (primary) hypertension: Secondary | ICD-10-CM | POA: Diagnosis present

## 2016-09-07 DIAGNOSIS — I739 Peripheral vascular disease, unspecified: Secondary | ICD-10-CM | POA: Diagnosis present

## 2016-09-07 DIAGNOSIS — R8290 Unspecified abnormal findings in urine: Secondary | ICD-10-CM | POA: Diagnosis present

## 2016-09-07 DIAGNOSIS — I70239 Atherosclerosis of native arteries of right leg with ulceration of unspecified site: Secondary | ICD-10-CM | POA: Diagnosis not present

## 2016-09-07 DIAGNOSIS — I70209 Unspecified atherosclerosis of native arteries of extremities, unspecified extremity: Secondary | ICD-10-CM | POA: Diagnosis present

## 2016-09-07 DIAGNOSIS — E876 Hypokalemia: Secondary | ICD-10-CM | POA: Diagnosis present

## 2016-09-07 DIAGNOSIS — L97421 Non-pressure chronic ulcer of left heel and midfoot limited to breakdown of skin: Secondary | ICD-10-CM | POA: Diagnosis not present

## 2016-09-07 DIAGNOSIS — L97529 Non-pressure chronic ulcer of other part of left foot with unspecified severity: Secondary | ICD-10-CM | POA: Diagnosis present

## 2016-09-07 LAB — URINALYSIS, ROUTINE W REFLEX MICROSCOPIC
Bilirubin Urine: NEGATIVE
GLUCOSE, UA: NEGATIVE mg/dL
KETONES UR: NEGATIVE mg/dL
NITRITE: NEGATIVE
PROTEIN: 100 mg/dL — AB
Specific Gravity, Urine: 1.012 (ref 1.005–1.030)
pH: 6 (ref 5.0–8.0)

## 2016-09-07 LAB — CBC WITH DIFFERENTIAL/PLATELET
BASOS ABS: 0 10*3/uL (ref 0.0–0.1)
BASOS PCT: 0 %
EOS ABS: 0 10*3/uL (ref 0.0–0.7)
EOS PCT: 0 %
HCT: 32.8 % — ABNORMAL LOW (ref 39.0–52.0)
HEMOGLOBIN: 10.6 g/dL — AB (ref 13.0–17.0)
Lymphocytes Relative: 3 %
Lymphs Abs: 0.7 10*3/uL (ref 0.7–4.0)
MCH: 27 pg (ref 26.0–34.0)
MCHC: 32.3 g/dL (ref 30.0–36.0)
MCV: 83.5 fL (ref 78.0–100.0)
Monocytes Absolute: 1.6 10*3/uL — ABNORMAL HIGH (ref 0.1–1.0)
Monocytes Relative: 7 %
NEUTROS PCT: 90 %
Neutro Abs: 19.7 10*3/uL — ABNORMAL HIGH (ref 1.7–7.7)
PLATELETS: 259 10*3/uL (ref 150–400)
RBC: 3.93 MIL/uL — AB (ref 4.22–5.81)
RDW: 15 % (ref 11.5–15.5)
WBC: 22 10*3/uL — AB (ref 4.0–10.5)

## 2016-09-07 LAB — C-REACTIVE PROTEIN: CRP: 20.3 mg/dL — AB (ref <1.0)

## 2016-09-07 LAB — CREATININE, SERUM
Creatinine, Ser: 2.13 mg/dL — ABNORMAL HIGH (ref 0.61–1.24)
GFR calc Af Amer: 30 mL/min — ABNORMAL LOW (ref >60)
GFR calc non Af Amer: 26 mL/min — ABNORMAL LOW (ref >60)

## 2016-09-07 LAB — SEDIMENTATION RATE: SED RATE: 101 mm/h — AB (ref 0–16)

## 2016-09-07 LAB — URINE MICROSCOPIC-ADD ON

## 2016-09-07 LAB — COMPREHENSIVE METABOLIC PANEL
ALT: 13 U/L — AB (ref 17–63)
ANION GAP: 12 (ref 5–15)
AST: 25 U/L (ref 15–41)
Albumin: 2.6 g/dL — ABNORMAL LOW (ref 3.5–5.0)
Alkaline Phosphatase: 71 U/L (ref 38–126)
BILIRUBIN TOTAL: 0.3 mg/dL (ref 0.3–1.2)
BUN: 38 mg/dL — AB (ref 6–20)
CALCIUM: 9.7 mg/dL (ref 8.9–10.3)
CO2: 19 mmol/L — ABNORMAL LOW (ref 22–32)
CREATININE: 2.22 mg/dL — AB (ref 0.61–1.24)
Chloride: 110 mmol/L (ref 101–111)
GFR calc Af Amer: 29 mL/min — ABNORMAL LOW (ref >60)
GFR calc non Af Amer: 25 mL/min — ABNORMAL LOW (ref >60)
GLUCOSE: 157 mg/dL — AB (ref 65–99)
Potassium: 3.8 mmol/L (ref 3.5–5.1)
SODIUM: 141 mmol/L (ref 135–145)
TOTAL PROTEIN: 8.2 g/dL — AB (ref 6.5–8.1)

## 2016-09-07 LAB — LACTIC ACID, PLASMA: Lactic Acid, Venous: 2.4 mmol/L (ref 0.5–1.9)

## 2016-09-07 LAB — I-STAT CG4 LACTIC ACID, ED
LACTIC ACID, VENOUS: 3.23 mmol/L — AB (ref 0.5–1.9)
LACTIC ACID, VENOUS: 2.72 mmol/L — AB (ref 0.5–1.9)
LACTIC ACID, VENOUS: 2.72 mmol/L — AB (ref 0.5–1.9)

## 2016-09-07 MED ORDER — PIPERACILLIN-TAZOBACTAM 3.375 G IVPB
3.3750 g | Freq: Three times a day (TID) | INTRAVENOUS | Status: DC
  Administered 2016-09-08: 3.375 g via INTRAVENOUS
  Filled 2016-09-07 (×3): qty 50

## 2016-09-07 MED ORDER — VANCOMYCIN HCL IN DEXTROSE 1-5 GM/200ML-% IV SOLN
1000.0000 mg | Freq: Once | INTRAVENOUS | Status: DC
  Filled 2016-09-07: qty 200

## 2016-09-07 MED ORDER — PANTOPRAZOLE SODIUM 40 MG PO TBEC
40.0000 mg | DELAYED_RELEASE_TABLET | Freq: Every day | ORAL | Status: DC
  Administered 2016-09-08: 40 mg via ORAL
  Filled 2016-09-07: qty 1

## 2016-09-07 MED ORDER — ACETAMINOPHEN 650 MG RE SUPP: 650.0000 mg | Freq: Four times a day (QID) | RECTAL | Status: DC | PRN

## 2016-09-07 MED ORDER — SODIUM CHLORIDE 0.9 % IV BOLUS (SEPSIS)
1000.0000 mL | Freq: Once | INTRAVENOUS | Status: AC
  Administered 2016-09-07: 1000 mL via INTRAVENOUS

## 2016-09-07 MED ORDER — SODIUM CHLORIDE 0.9% FLUSH
3.0000 mL | Freq: Two times a day (BID) | INTRAVENOUS | Status: DC
  Administered 2016-09-07 – 2016-09-09 (×5): 3 mL via INTRAVENOUS

## 2016-09-07 MED ORDER — ALBUTEROL SULFATE (2.5 MG/3ML) 0.083% IN NEBU
5.0000 mg | INHALATION_SOLUTION | Freq: Once | RESPIRATORY_TRACT | Status: AC
  Administered 2016-09-07: 5 mg via RESPIRATORY_TRACT
  Filled 2016-09-07: qty 6

## 2016-09-07 MED ORDER — ALBUTEROL SULFATE (2.5 MG/3ML) 0.083% IN NEBU
2.5000 mg | INHALATION_SOLUTION | Freq: Four times a day (QID) | RESPIRATORY_TRACT | Status: DC
Start: 2016-09-07 — End: 2016-09-07

## 2016-09-07 MED ORDER — ALLOPURINOL 100 MG PO TABS
100.0000 mg | ORAL_TABLET | Freq: Two times a day (BID) | ORAL | Status: DC
  Administered 2016-09-07: 100 mg via ORAL

## 2016-09-07 MED ORDER — PIPERACILLIN-TAZOBACTAM 3.375 G IVPB 30 MIN
3.3750 g | Freq: Once | INTRAVENOUS | Status: AC
  Administered 2016-09-07: 3.375 g via INTRAVENOUS
  Filled 2016-09-07: qty 50

## 2016-09-07 MED ORDER — ACETAMINOPHEN 325 MG PO TABS: 650.0000 mg | ORAL_TABLET | Freq: Four times a day (QID) | ORAL | Status: DC | PRN

## 2016-09-07 MED ORDER — IPRATROPIUM-ALBUTEROL 0.5-2.5 (3) MG/3ML IN SOLN
3.0000 mL | Freq: Four times a day (QID) | RESPIRATORY_TRACT | Status: DC
  Administered 2016-09-07 – 2016-09-08 (×2): 3 mL via RESPIRATORY_TRACT
  Filled 2016-09-07 (×2): qty 3

## 2016-09-07 MED ORDER — VANCOMYCIN HCL 10 G IV SOLR
1250.0000 mg | Freq: Once | INTRAVENOUS | Status: AC
  Administered 2016-09-07: 1250 mg via INTRAVENOUS
  Filled 2016-09-07: qty 1250

## 2016-09-07 MED ORDER — IPRATROPIUM BROMIDE 0.02 % IN SOLN
0.5000 mg | Freq: Once | RESPIRATORY_TRACT | Status: AC
  Administered 2016-09-07: 0.5 mg via RESPIRATORY_TRACT
  Filled 2016-09-07: qty 2.5

## 2016-09-07 MED ORDER — ALLOPURINOL 100 MG PO TABS
200.0000 mg | ORAL_TABLET | Freq: Every day | ORAL | Status: DC
  Administered 2016-09-07: 200 mg via ORAL
  Filled 2016-09-07: qty 2

## 2016-09-07 MED ORDER — SODIUM CHLORIDE 0.9 % IV SOLN
INTRAVENOUS | Status: DC
  Administered 2016-09-07 – 2016-09-08 (×2): via INTRAVENOUS

## 2016-09-07 MED ORDER — ASPIRIN EC 81 MG PO TBEC
81.0000 mg | DELAYED_RELEASE_TABLET | Freq: Every day | ORAL | Status: DC
  Administered 2016-09-08 – 2016-09-10 (×3): 81 mg via ORAL
  Filled 2016-09-07 (×3): qty 1

## 2016-09-07 MED ORDER — VALACYCLOVIR HCL 500 MG PO TABS
1000.0000 mg | ORAL_TABLET | Freq: Three times a day (TID) | ORAL | Status: DC
  Administered 2016-09-07: 1000 mg via ORAL
  Filled 2016-09-07 (×2): qty 2

## 2016-09-07 MED ORDER — VANCOMYCIN HCL IN DEXTROSE 750-5 MG/150ML-% IV SOLN
750.0000 mg | INTRAVENOUS | Status: DC
  Administered 2016-09-08: 750 mg via INTRAVENOUS
  Filled 2016-09-07 (×2): qty 150

## 2016-09-07 MED ORDER — SODIUM CHLORIDE 0.9 % IV BOLUS (SEPSIS): 250.0000 mL | Freq: Once | INTRAVENOUS | Status: DC

## 2016-09-07 MED ORDER — HEPARIN SODIUM (PORCINE) 5000 UNIT/ML IJ SOLN
5000.0000 [IU] | Freq: Three times a day (TID) | INTRAMUSCULAR | Status: DC
  Administered 2016-09-08: 5000 [IU] via SUBCUTANEOUS
  Filled 2016-09-07 (×3): qty 1

## 2016-09-07 NOTE — ED Notes (Signed)
CRITICAL VALUE ALERT  Critical value received:  Lactic acid 3.23  Date of notification:  09/07/2016  Time of notification:  1326  Critical value read back:Yes.    Nurse who received alert: Heide GuileHope Wyona Neils  MD notified (1st page):  Dr. Corlis LeakMackuen. Changed pt to acuity 2. Will get pt back ASAP

## 2016-09-07 NOTE — ED Notes (Signed)
Both saline boluses stopped per Cammy CopaAbigail EDPA

## 2016-09-07 NOTE — Progress Notes (Signed)
Pharmacy Antibiotic Note  Vincent Miranda is a 82 y.o. male admitted on 09/07/2016 with increasing SOB, fever and chills. Pharmacy has been consulted for vancomycin and Zosyn dosing for cellulitis.  Noted patient has a history of chronic osteomyelitis of both legs and he refused amputations in the past.  He also has a rash on the left leg.  SCr 2.22 with history of CKD3.  Tmax 98.4, WBC 22, LA 3.23.  Plan: - Vanc 1250mg  IV x 1 then 750mg  IV Q24H for goal trough 15-20 mcg/mL - Zosyn 3.375gm IV Q8H, 4 hr infusion - Monitor renal fxn, clinical progress, vanc trough as indicated  Height: 5' 10.5" (179.1 cm) Weight: 165 lb (74.8 kg) IBW/kg (Calculated) : 74.15  Temp (24hrs), Avg:98.4 F (36.9 C), Min:98.4 F (36.9 C), Max:98.4 F (36.9 C)   Recent Labs Lab 09/07/16 1305 09/07/16 1318  WBC 22.0*  --   CREATININE 2.22*  --   LATICACIDVEN  --  3.23*    Estimated Creatinine Clearance: 23.7 mL/min (by C-G formula based on SCr of 2.22 mg/dL (H)).    No Known Allergies  Antimicrobials this admission:  Vanc 10/19 >> Zosyn 10/19 >>  Dose adjustments this admission:  N/A  Microbiology results:  10/19 BCx x2 -  10/19 UCx -    Chelsea Aushuy D. Laney Potashang, PharmD, BCPS Pager:  [phone] 09/07/2016, 2:38 PM

## 2016-09-07 NOTE — Telephone Encounter (Signed)
I agree. Vincent MaduroRobert has severe wounds and chronic osteomyelitis of both his legs. The only definitive treatment has been bilateral amputations, but he has declined in the past. We have been palliating him at home for about a year, keeping him comfortable. It is only a matter of time before his legs cause systemic infection again requiring admission and IV antibiotics, or transition to full comfort care but I doubt he and the wife are ready for this.

## 2016-09-07 NOTE — ED Notes (Signed)
Second set of blood cx obtained 

## 2016-09-07 NOTE — ED Notes (Signed)
Pt's wife reports BLE redness, swelling and warmth.  States 2 days ago, pt had a fever, but it came down.  Pt presents with bila feet wound from diabetic ulcers.  Has wound care at home.  Red, streaking noted to L leg all the way up to his thigh.  Pain around his L knee when palpated.

## 2016-09-07 NOTE — ED Notes (Signed)
MD at bedside.  hospitalist 

## 2016-09-07 NOTE — Telephone Encounter (Signed)
Call from pt's wife stating that patient has been sick for the last few days-c/o increased shortness of breath, fever (which has come down from 101.1), and chills.   States pt's stayed in the bed all day yesterday and there is also a "red streak" "rash" on pt's leg that started on lower leg and is now up to thigh.  Pt's wife advised to take patient to ED now or call EMS for transport.   She reluctantly agreed to have patient evaluated in ED.Criss Alvine.Eternity Dexter Cassady10/19/20179:15 AM  Patient Active Problem List   Diagnosis Date Noted  . Paroxysmal atrial fibrillation (HCC) 03/01/2016  . Urinary retention 02/15/2016  . Gout 02/03/2016  . Atherosclerotic peripheral vascular disease with gangrene (HCC) 03/28/2015  . Osteomyelitis of left foot (HCC) 03/26/2015  . Anemia of chronic disease   . CKD (chronic kidney disease), stage III 10/18/2013  . Protein-calorie malnutrition, severe (HCC) 08/07/2013

## 2016-09-07 NOTE — ED Notes (Signed)
Armen PickupEric Brewer Charge RN aware this nurse attempted report 4 times and sat on hold for at least 5 minutes. Shelly AC also aware.

## 2016-09-07 NOTE — ED Notes (Signed)
Attempted to start another IV site without success-zosyn started.  Abigail EDPA made aware.

## 2016-09-07 NOTE — H&P (Signed)
Date: 09/07/2016               Patient Name:  Vincent Miranda MRN: [id number]  DOB: 1924-12-20 Age / Sex: 82 y.o., male   PCP: Gwynn Burly, DO         Medical Service: Internal Medicine Teaching Service         Attending Physician: Dr. Earl Lagos, MD    First Contact: Dr. Eulah Pont  Pager: [phone]  Second Contact: Dr. Valentino Nose Pager: [phone]       After Hours (After 5p/  First Contact Pager: [phone]  weekends / holidays): Second Contact Pager: [phone]   Chief Complaint: leg rash   History of Present Illness: [name] is a 82 y.o. male with a PMH of gout, chronic wounds secondary to peripheral vascular disease, paroxysmal atrial fibrillation and BPH who presents with a rash over his left leg. On Tuesday of this week he developed a rash that started at his ankles then spread up past his knees. The rash looks like a collection of red dots, it is nonpainful and not itchy. On Tuesday he had an episode of night sweats and had a temperature of 101 which resolved Wednesday. He denies sick contacts, congestion, blurred vision, headache, dizziness, shortness of breath, orthopnea, chest pain, chest tightness, decreased appetite, or dysuria. He states that he had chickenpocks as a child, denies shingles vaccination or history of shingles.  Of note he has a history of chronic foot and lower leg ulcers secondary to peripheral vascular disease, venous stasis, and edema. He has extensive ulcerations and has had recommendations for amputation by orthopedic surgery.  In the ED he was afebrile, tachycardic (HR 90s) and hypertensive, and sating at 97% on room air. WBC 22 with left shift, Crt 2.2, lactic acid 3.23 urinalysis showed moderate leukocytes but was negative for nitrites. He is being given by mouth meds he started coughing and his breathing became labored. His breathing improved after he was given an albuterol and ipratropium nebulizer. Code sepsis was called and  he was given one dose of zosyn, vanc, valacyclovir, 2 liters NS and blood cultures were obtained.   Meds:   (Not in a hospital admission)   Allergies: Allergies as of 09/07/2016  . (No Known Allergies)   Past Medical History:  Diagnosis Date  . Acute blood loss anemia 10/18/2013  . Anemia of chronic disease   . Chronic kidney disease   . Gout   . History of blood transfusion   . Hypertension     Family History:  History reviewed. No pertinent family history.   Social History:  He has a 40-pack-year smoking history, he quit smoking 40 years ago, denies alcohol or drug use.  Review of Systems: A complete ROS was negative except as per HPI.   Physical Exam: Vitals:   09/07/16 1900 09/07/16 1930 09/07/16 2000 09/07/16 2030  BP: 167/89 161/88 159/83 162/85  Pulse: 98 94 98 102  Resp: 20 15 24 21   Temp:      TempSrc:      SpO2: 96% 98% 96% 95%  Weight:      Height:       Physical Exam  Constitutional: He appears well-developed and well-nourished. No distress.  HENT:  Head: Normocephalic and atraumatic.  Right pupil irregular s/p lense replacement   Eyes: EOM are normal.  Neck: No tracheal deviation present. No thyromegaly present.  Cardiovascular: An irregular rhythm present. Tachycardia present.  No murmur heard. Pulmonary/Chest: He has wheezes.  Abdominal: Soft. Bowel sounds are normal. There is no tenderness. There is no guarding.  Lymphadenopathy:    He has no cervical adenopathy.  Neurological: He is alert.  Skin: Skin is warm and dry.  Multiple amputated toes bilateral, chronic ulcers bilateral, macular non blanching rash, right leg swollen and warm to touch   Psychiatric: He has a normal mood and affect. His behavior is normal.         Labs: CBC:  Recent Labs Lab 09/07/16 1305  WBC 22.0*  NEUTROABS 19.7*  HGB 10.6*  HCT 32.8*  MCV 83.5  PLT 259   Basic Metabolic Panel:  Recent Labs Lab 09/07/16 1305  NA 141  K 3.8  CL 110  CO2  19*  GLUCOSE 157*  BUN 38*  CREATININE 2.22*  CALCIUM 9.7   Liver Function Tests:  Recent Labs Lab 09/07/16 1305  AST 25  ALT 13*  ALKPHOS 71  BILITOT 0.3  PROT 8.2*  ALBUMIN 2.6*   Inflammatory Markers: Recent Labs     09/07/16  1654  CRP  20.3*  ESRSEDRATE  101*   CBG: Lab Results  Component Value Date   HGBA1C 5.6 03/29/2015   Urine Studies: Urinalysis    Component Value Date/Time   COLORURINE YELLOW 09/07/2016 1608   APPEARANCEUR CLEAR 09/07/2016 1608   APPEARANCEUR Cloudy (A) 02/15/2016 1705   LABSPEC 1.012 09/07/2016 1608   PHURINE 6.0 09/07/2016 1608   GLUCOSEU NEGATIVE 09/07/2016 1608   HGBUR MODERATE (A) 09/07/2016 1608   BILIRUBINUR NEGATIVE 09/07/2016 1608   BILIRUBINUR Negative 02/15/2016 1705   KETONESUR NEGATIVE 09/07/2016 1608   PROTEINUR 100 (A) 09/07/2016 1608   UROBILINOGEN 0.2 03/26/2015 1400   NITRITE NEGATIVE 09/07/2016 1608   LEUKOCYTESUR MODERATE (A) 09/07/2016 1608   LEUKOCYTESUR Negative 02/15/2016 1705   EKG: EKG: right bundle branch block   Imaging: Chest xray- hyperinflation with flattened diaphragm , cardiomegaly   Assessment & Plan by Problem: [name] is a 82 y.o. male with PMH gout, chronic wounds secondary to peripheral vascular disease, paroxysmal atrial fibrillation and BPH. Presented today with a left leg rash   Rash  The cause of this rash is unclear. It does not follow a dermatomal distribution which would be atypical for shingles. He is afebrile but has tachycardia, leukocytosis, and an elevated lactic acid. These finding may be related to chronic inflammation of his severe chronic lower extremity ulcers. This may be cellulitis extending from his severe chronic ulcers. He has a history of osteomyelitis but did not want surgical intervention and was treated with 6 weeks of IV antibiotics. We wonder if this may be related and will continue broad spectrum antibiotics for now  Continue vancomycin, and  zosyn.  Discontinue valacyclovir  Follow up blood cultures  Trending lactic acid   Abnormal urinalysis  He denies symptoms of UTI but does have a history of BPH. Urinalysis had leukocytes.  Follow up urine culture     CKD (chronic kidney disease), stage III On admission creatinine 2.2 with baseline 2.1-2.8.    Atherosclerotic peripheral vascular disease with gangrene [facility]) He is on lasix 40 mg daily at home, we will hold this for now     Paroxysmal atrial fibrillation (HCC) On admission he has a increased rate and irregular rhythm. He denies chest pain or palpitations. His home medications include aspirin 81 mg daily.   GERD  Continue home medication pantoprazole 40 mg daily.  Gout  We will continue his home medication allopurinol 200 mg daily.   BPH  Home medication is finasteride 5 mg daily, we will hold this for his hospitalization.   F 2 L NS bolus in the ED  E Hypernatremia  N will be started after repeat swallow evaluation  DVT Ppx heparin  Code Status full   Dispo: Admit patient to Inpatient with expected length of stay greater than 2 midnights.  Signed: Eulah PontNina Keniah Klemmer, MD 09/07/2016, 8:38 PM  Pager: [phone]

## 2016-09-07 NOTE — ED Notes (Signed)
First set of blood cx obtained 

## 2016-09-07 NOTE — ED Provider Notes (Signed)
[facility]-EMERGENCY DEPT Provider Note   CSN: [id number] Arrival date & time: 09/07/16  1240     History   Chief Complaint Chief Complaint  Patient presents with  . Rash  . Fever  . Cellulitis  . Herpes Zoster    HPI Vincent Miranda is a 82 y.o. male who presents the emergency department, chief complaint of rash and fever.  He has a past medical history of paroxysmal atrial fibrillation, atherosclerotic peripheral vascular disease with history of osteomyelitis and Ray amputation. He has been having wound care for the past several years and his wife reports that his feet are well healing. He has wound care nurses who come and change his dressings. He has chronic peripheral edema from venous insufficiency. The patient has been using Unna boots. His wife states that 2 days ago. The patient was complaining of being very hot. She took his temperature and he had a fever of 102 at home. He had another day of myalgia and malaise, some fever up to 101. Last night she noticed a rash that began developing on the patient's left leg. It has progressively worsened and is traveling up From the knee and around the back of the leg. The patient denies pain except for when the rash is touched. He denies a history of shingles. He has no changes in medications, lotions or soaps.   HPI  Past Medical History:  Diagnosis Date  . Acute blood loss anemia 10/18/2013  . Anemia of chronic disease   . Chronic kidney disease   . Gout   . History of blood transfusion   . Hypertension     Patient Active Problem List   Diagnosis Date Noted  . Paroxysmal atrial fibrillation (HCC) 03/01/2016  . Urinary retention 02/15/2016  . Gout 02/03/2016  . Atherosclerotic peripheral vascular disease with gangrene (HCC) 03/28/2015  . Osteomyelitis of left foot (HCC) 03/26/2015  . Anemia of chronic disease   . CKD (chronic kidney disease), stage III 10/18/2013  . Protein-calorie malnutrition, severe (HCC) 08/07/2013     Past Surgical History:  Procedure Laterality Date  . AMPUTATION Left 10/19/2013   Procedure: AMPUTATION RAY  LEFT GREAT TOE;  Surgeon: Nadara Mustard, MD;  Location: [facility] OR;  Service: Orthopedics;  Laterality: Left;  . EYE SURGERY Right    "had accident; had to replace lens in eye"       Home Medications    Prior to Admission medications   Medication Sig Start Date End Date Taking? Authorizing Provider  allopurinol (ZYLOPRIM) 100 MG tablet Take 2 tablets (200 mg total) by mouth daily. 03/21/16   Selina Cooley, MD  aspirin EC 81 MG tablet Take 81 mg by mouth daily.     Historical Provider, MD  finasteride (PROSCAR) 5 MG tablet Take 1 tablet (5 mg total) by mouth daily. 06/22/16 06/22/17  Gwynn Burly, DO  furosemide (LASIX) 40 MG tablet Take 1 tablet (40 mg total) by mouth daily. 06/24/16   Gwynn Burly, DO  pantoprazole (PROTONIX) 40 MG tablet Take 1 tablet (40 mg total) by mouth daily. 02/28/16   Jana Half, MD  Zinc 50 MG TABS Take 50 mg by mouth daily.    Historical Provider, MD    Family History History reviewed. No pertinent family history.  Social History Social History  Substance Use Topics  . Smoking status: Former Smoker    Packs/day: 1.00    Years: 43.00    Types: Cigarettes  . Smokeless tobacco: Never Used  Comment: "quit smoking cigarettes in the 1980s"  . Alcohol use Yes     Comment: "nothing anymore" (02/03/2016)     Allergies   Review of patient's allergies indicates no known allergies.   Review of Systems Review of Systems Ten systems reviewed and are negative for acute change, except as noted in the HPI.    Physical Exam Updated Vital Signs BP 174/91 (BP Location: Left Arm)   Pulse 95   Temp 98.4 F (36.9 C)   Resp 15   Ht 5' 10.5" (1.791 m)   Wt 74.8 kg   SpO2 97%   BMI 23.34 kg/m   Physical Exam  Constitutional: He is oriented to person, place, and time. He appears well-developed and well-nourished. No distress.  HENT:  Head:  Normocephalic and atraumatic.  Eyes: EOM are normal. Pupils are equal, round, and reactive to light.  Neck: Normal range of motion. Neck supple.  Cardiovascular: Normal rate.   DP and PT pulses auscultated by Doppler bilaterally.  Pulmonary/Chest: Effort normal and breath sounds normal.  Abdominal: Soft. Bowel sounds are normal. He exhibits no distension. There is no tenderness.  Musculoskeletal: Normal range of motion.  Neurological: He is alert and oriented to person, place, and time.  Skin:  Violaceous and erythematous patches varying size from the medial knee around the front of the thigh and covering the back of the hamstring. Crossing multiple dermatomes  Nursing note and vitals reviewed.      ED Treatments / Results  Labs (all labs ordered are listed, but only abnormal results are displayed) Labs Reviewed  COMPREHENSIVE METABOLIC PANEL - Abnormal; Notable for the following:       Result Value   CO2 19 (*)    Glucose, Bld 157 (*)    BUN 38 (*)    Creatinine, Ser 2.22 (*)    Total Protein 8.2 (*)    Albumin 2.6 (*)    ALT 13 (*)    GFR calc non Af Amer 25 (*)    GFR calc Af Amer 29 (*)    All other components within normal limits  CBC WITH DIFFERENTIAL/PLATELET - Abnormal; Notable for the following:    WBC 22.0 (*)    RBC 3.93 (*)    Hemoglobin 10.6 (*)    HCT 32.8 (*)    Neutro Abs 19.7 (*)    Monocytes Absolute 1.6 (*)    All other components within normal limits  I-STAT CG4 LACTIC ACID, ED - Abnormal; Notable for the following:    Lactic Acid, Venous 3.23 (*)    All other components within normal limits  I-STAT CG4 LACTIC ACID, ED - Abnormal; Notable for the following:    Lactic Acid, Venous 2.72 (*)    All other components within normal limits  CULTURE, BLOOD (ROUTINE X 2)  CULTURE, BLOOD (ROUTINE X 2)  URINE CULTURE  URINALYSIS, ROUTINE W REFLEX MICROSCOPIC (NOT AT [facility])  SEDIMENTATION RATE  C-REACTIVE PROTEIN    EKG  EKG Interpretation None        Radiology Dg Chest 2 View  Result Date: 09/07/2016 CLINICAL DATA:  Rash EXAM: CHEST  2 VIEW COMPARISON:  02/24/2016 FINDINGS: Chronic cardiomegaly and aortic tortuosity. Mild reticular markings at the bases is favored to reflect mild atelectasis. There is no edema, consolidation, effusion, or pneumothorax. Hyperinflation with flat diaphragm. IMPRESSION: Probable COPD. Chronic cardiomegaly. No acute superimposed finding. Electronically Signed   By: Marnee Spring M.D.   On: 09/07/2016 13:38    Procedures  Procedures (including critical care time)  Medications Ordered in ED Medications  sodium chloride 0.9 % bolus 1,000 mL (not administered)    And  sodium chloride 0.9 % bolus 1,000 mL (not administered)    And  sodium chloride 0.9 % bolus 250 mL (not administered)  piperacillin-tazobactam (ZOSYN) IVPB 3.375 g (not administered)  vancomycin (VANCOCIN) 1,250 mg in sodium chloride 0.9 % 250 mL IVPB (not administered)  vancomycin (VANCOCIN) IVPB 750 mg/150 ml premix (not administered)  piperacillin-tazobactam (ZOSYN) IVPB 3.375 g (not administered)  valACYclovir (VALTREX) tablet 1,000 mg (not administered)     Initial Impression / Assessment and Plan / ED Course  I have reviewed the triage vital signs and the nursing notes.  Pertinent labs & imaging results that were available during my care of the patient were reviewed by me and considered in my medical decision making (see chart for details).  Clinical Course  Comment By Time  I went to check the patient and he was  having some wheezing with accessory muscle use.  Nurse reports that the patient coughed very hard after trying to swallow the Valtrex pill. She suspects some aspiration. I have asked for a swallow screen on the patient. The patient has only received 250 mL of fluid and has no history of CHF or allergic reaction to medication. No itching or hives. No oral swelling. Patient's chest x-ray shows COPD. He does not use  inhalers at home. I have asked Dr. Anitra LauthPlunkett to see the patient. She feels that this is reactive airway. Fluids have been stopped for now. Have ordered a albuterol and Atrovent treatment Arthor Captainbigail Baani Bober, PA-C 10/19 1551    Patient will be admitted to the SDU by the internal medicine teaching service. He is in stable condition at this time.  Final Clinical Impressions(s) / ED Diagnoses   Final diagnoses:  None    New Prescriptions New Prescriptions   No medications on file     Arthor Captainbigail Allene Furuya, PA-C 09/07/16 1630    Gwyneth SproutWhitney Plunkett, MD 09/07/16 2149

## 2016-09-07 NOTE — ED Triage Notes (Signed)
Pt's wife states that she saw rash on left leg on side pass knee.  Fever two days ago 101.1. Today patient does not have fever.  By yesterday morning, no more fever.  Pt denies having shingles prior.  Rash has now moved up leg with bumps. Patient denies pain or itching.

## 2016-09-07 NOTE — ED Notes (Addendum)
After giving PO med as ordered, pt started to cough, and breathing is more labored.  EDPA aware.  Sats WNL

## 2016-09-08 DIAGNOSIS — R8290 Unspecified abnormal findings in urine: Secondary | ICD-10-CM

## 2016-09-08 DIAGNOSIS — Z79899 Other long term (current) drug therapy: Secondary | ICD-10-CM

## 2016-09-08 DIAGNOSIS — I70239 Atherosclerosis of native arteries of right leg with ulceration of unspecified site: Secondary | ICD-10-CM

## 2016-09-08 DIAGNOSIS — E876 Hypokalemia: Secondary | ICD-10-CM

## 2016-09-08 DIAGNOSIS — R8299 Other abnormal findings in urine: Secondary | ICD-10-CM

## 2016-09-08 DIAGNOSIS — L97926 Non-pressure chronic ulcer of unspecified part of left lower leg with bone involvement without evidence of necrosis: Secondary | ICD-10-CM

## 2016-09-08 DIAGNOSIS — I70249 Atherosclerosis of native arteries of left leg with ulceration of unspecified site: Secondary | ICD-10-CM

## 2016-09-08 DIAGNOSIS — N400 Enlarged prostate without lower urinary tract symptoms: Secondary | ICD-10-CM

## 2016-09-08 DIAGNOSIS — R21 Rash and other nonspecific skin eruption: Secondary | ICD-10-CM

## 2016-09-08 DIAGNOSIS — I480 Paroxysmal atrial fibrillation: Secondary | ICD-10-CM

## 2016-09-08 DIAGNOSIS — A419 Sepsis, unspecified organism: Principal | ICD-10-CM

## 2016-09-08 DIAGNOSIS — N179 Acute kidney failure, unspecified: Secondary | ICD-10-CM

## 2016-09-08 DIAGNOSIS — L97916 Non-pressure chronic ulcer of unspecified part of right lower leg with bone involvement without evidence of necrosis: Secondary | ICD-10-CM

## 2016-09-08 DIAGNOSIS — Z7982 Long term (current) use of aspirin: Secondary | ICD-10-CM

## 2016-09-08 DIAGNOSIS — N183 Chronic kidney disease, stage 3 (moderate): Secondary | ICD-10-CM

## 2016-09-08 LAB — BASIC METABOLIC PANEL
ANION GAP: 6 (ref 5–15)
BUN: 36 mg/dL — ABNORMAL HIGH (ref 6–20)
CHLORIDE: 112 mmol/L — AB (ref 101–111)
CO2: 24 mmol/L (ref 22–32)
Calcium: 8.9 mg/dL (ref 8.9–10.3)
Creatinine, Ser: 2.19 mg/dL — ABNORMAL HIGH (ref 0.61–1.24)
GFR calc Af Amer: 29 mL/min — ABNORMAL LOW (ref >60)
GFR, EST NON AFRICAN AMERICAN: 25 mL/min — AB (ref >60)
GLUCOSE: 106 mg/dL — AB (ref 65–99)
POTASSIUM: 3 mmol/L — AB (ref 3.5–5.1)
Sodium: 142 mmol/L (ref 135–145)

## 2016-09-08 LAB — CBC
HCT: 26.3 % — ABNORMAL LOW (ref 39.0–52.0)
HEMOGLOBIN: 8.6 g/dL — AB (ref 13.0–17.0)
MCH: 26.9 pg (ref 26.0–34.0)
MCHC: 32.7 g/dL (ref 30.0–36.0)
MCV: 82.2 fL (ref 78.0–100.0)
PLATELETS: 201 10*3/uL (ref 150–400)
RBC: 3.2 MIL/uL — AB (ref 4.22–5.81)
RDW: 14.9 % (ref 11.5–15.5)
WBC: 14 10*3/uL — AB (ref 4.0–10.5)

## 2016-09-08 LAB — MRSA PCR SCREENING: MRSA BY PCR: NEGATIVE

## 2016-09-08 LAB — LACTIC ACID, PLASMA: LACTIC ACID, VENOUS: 1 mmol/L (ref 0.5–1.9)

## 2016-09-08 MED ORDER — ALLOPURINOL 100 MG PO TABS
200.0000 mg | ORAL_TABLET | Freq: Every day | ORAL | Status: DC
  Administered 2016-09-08: 100 mg via ORAL
  Filled 2016-09-08: qty 2

## 2016-09-08 MED ORDER — CEFTRIAXONE SODIUM 1 G IJ SOLR
1.0000 g | INTRAMUSCULAR | Status: DC
  Administered 2016-09-08 – 2016-09-09 (×2): 1 g via INTRAVENOUS
  Filled 2016-09-08 (×2): qty 10

## 2016-09-08 MED ORDER — SILVER SULFADIAZINE 1 % EX CREA
TOPICAL_CREAM | Freq: Every day | CUTANEOUS | Status: DC
  Administered 2016-09-08 – 2016-09-09 (×2): via TOPICAL
  Filled 2016-09-08: qty 85

## 2016-09-08 MED ORDER — IPRATROPIUM-ALBUTEROL 0.5-2.5 (3) MG/3ML IN SOLN: 3.0000 mL | RESPIRATORY_TRACT | Status: DC | PRN

## 2016-09-08 MED ORDER — POTASSIUM CHLORIDE CRYS ER 20 MEQ PO TBCR
40.0000 meq | EXTENDED_RELEASE_TABLET | Freq: Two times a day (BID) | ORAL | Status: DC
  Administered 2016-09-08 (×2): 40 meq via ORAL
  Filled 2016-09-08 (×2): qty 2

## 2016-09-08 NOTE — Progress Notes (Signed)
New Admission Note:   Arrival Method: Stretcher from ED Mental Orientation: A&O x4 Telemetry: Box 23, CCMD notified Assessment: Completed Skin: Assessed with Jacklyn ShellAnne Lowe, RN, check flowsheets IV: L FA, R AC, R Hand Pain: 0/10 Tubes: N/A Safety Measures: Safety Fall Prevention Plan discussed with patient and wife Admission: Completed [address] Orientation: Patient has been orientated to the room, unit and the staff. Family: Wife at the bedside  Orders have been reviewed and implemented. Will continue to monitor the patient. Call light has been placed within reach and bed alarm has been activated.   Rivka BarbaraZenab Thornton Dohrmann BSN, RN  Phone Number: [phone]

## 2016-09-08 NOTE — Progress Notes (Signed)
  Date: 09/08/2016  Patient name: Vincent PigeonRobert T Miranda  Medical record number: [id number]  Date of birth: 12/15/1924   I have seen and evaluated Vincent Miranda and discussed their care with the Residency Team. In brief, patient is a 82 y/o male with PMH of gout, chronic wounds secondary to vascular disease, pAfib who p/w rash over left leg. Patient states that he has chronic b/l LE ulcers and is being treated with dressings at home. [facility] patient has received atleast 2 opinions form ortho that feet are non viable due to PAD and that the wounds will not heal and track down to the bone. He has declined amputation multiple times and on his last visit had declined it again. He presented yesterday with an erythematous rash that began on Tuesday initially over his ankles and now extending to his thigh. No pruritis or pain. He did complain of assoc fevers up to 101 F and night sweats. In ED he was found to have a WBC of 22 with an elevated creatinine of 2.2 and an elevated lactic acid. He was admitted for further work up.  PMHx, Fam Hx, and/or Soc Hx : as per resident admit note  Vitals:   09/08/16 1200 09/08/16 1234  BP: (!) 172/84 (!) 145/93  Pulse: 85 83  Resp: 18 18  Temp: 98.3 F (36.8 C)    Gen: AAO*3, NAD CVS: irregular, normal heart sounds Lungs: CTA b/l Abd: soft, non tender, BS + Ext: b/l LE dressings intact, erythematous, maculopapular rash over left leg extending up to left thigh with some coalescing.   Assessment and Plan: I have seen and evaluated the patient as outlined above. I agree with the formulated Assessment and Plan as detailed in the residents' note, with the following changes:   1. Sepsis likely secondary to chronic LE wounds: - Patient presented with a left LE rash of uncertain etiology. He was found to have an elevated WBC with lactic acidosis and fevers. This is likely secondary to re infection of his chronic LE wounds. Patient had refused amputations  multiple times in the past and is getting dressings done at home. These wounds are unlikely to heal given severe PVD and he remains at high risk for reinfection.  - There is a question of possible cystitis as well given u/a with moderate leukocytes but patient is asymptomatic and urine cx with only 20,000 colonies of gram negative rods - Will continue with ceftriaxone and vancomycin for now. Blood cx with NGTD - Leukocytosis is improving and lactic acidosis has resolved. -  It is possible that his rash demonstrates localized cellulitis but unable to determine at this point. Will monitor on abx. Patient refuses biopsy of the rash at this time.  2. AKI on CKD: -Patient with baseline Cr of approx 1.6 - 1.8 - Mildly worsened to 2.1 on current admission - Likely secondary to sepsis. Will continue to monitor for now. - If no improvement would consider renal sono and calculating Fe urea     Earl LagosNischal Adithya Difrancesco, MD 10/20/20173:25 PM

## 2016-09-08 NOTE — Consult Note (Signed)
WOC Nurse wound consult note It is noted that this patient has a dark purplish rash on the left pretibial region that is 19cm x 7cm x 0.  It is unclear the etiology of this may be localized cellulitis, there is erythema but I am unable to get clear history from the patient, if persistent with IV antibiotics may warrant dermatology consult. Has been treated for herpes zoster with acyclovir  Reason for Consult: bilateral foot wounds.  Wound type: arterial ulcerations bilateral feet Has been followed in the past by orthopedics and currently has HHRN. Patient does not have any grossly infected wounds they all appear chronic and stable. Pressure Ulcer POA: No Measurement: L pretibial: 5cm x 3cm x 0.2cm; clean, dry, non granular Left medial foot: 9cm x 2.5cm x 0.2; clean, some 5% pink, but dry, 70% fibrin; 25% black eschar; non granular Left medial malleolus: 1.5cm x 1.0cm x 0.2cm; fibrinous, dry, non granular Right dorsal foot: 3.5cm x 3.0cm x 0.3cm; clean, fibrinous, dry Right lateral: 4.5cm x 5cm x 0.2cm; yellow/fibrinous 10% eschar Right medial malleolus: 9cm x 7cm x 0.2cm; mostly pink, but dry, non granular Wound bed: see above  Drainage (amount, consistency, odor) minimal, yellow-brown, no odor Periwound: intact with reepithelialization, evidence of healing of some larger foot wounds.  Dressing procedure/placement/frequency: I will order silvadene to provide some moisture and antimicrobial effect. Cover with nonadherent.  Patient reports some type [facility] wraps [facility], however with arterial dx hopefully these were just dressings. It is hard to tell if he is currently followed by anyone for these wounds and it does sound as if he and his wife have been doing some self-treating of the wounds along the way.  Resume HHRN for wound care at the time of DC.  Discussed POC with patient and bedside nurse.  Re consult if needed, will not follow at this time. Thanks  Kaeli Nichelson M.D.[employer] MSN, RN,CNS,  CWOCN [phone])

## 2016-09-08 NOTE — Progress Notes (Signed)
   Subjective: Vincent Miranda still has a rash, it has spread somewhat up his leg. He denies any new concerns or complaints.   Objective:  Vital signs in last 24 hours: Vitals:   09/07/16 2000 09/07/16 2030 09/07/16 2118 09/08/16 0556  BP: 159/83 162/85 (!) 157/82 (!) 147/69  Pulse: 98 102 (!) 107 (!) 108  Resp: '24 21 20 19  '$ Temp:   98.7 F (37.1 C) 98.6 F (37 C)  TempSrc:   Oral Oral  SpO2: 96% 95% 91% 93%  Weight:   164 lb 10.9 oz (74.7 kg)   Height:   '5\' 11"'$  (1.803 m)    Physical Exam  Constitutional: He appears well-developed and well-nourished. No distress.  Cardiovascular: Normal rate.  An irregular rhythm present.  No murmur heard. Pulmonary/Chest: He has wheezes. He has no rales.  Abdominal: Soft. Bowel sounds are normal. He exhibits no distension. There is no tenderness.  Neurological: He is alert.  Extremities: no calf tenderness, Raised macular rash with spread halfway up his thigh  Medications: Infusions: . sodium chloride 100 mL/hr at 09/08/16 0121   Scheduled Medications: . allopurinol  200 mg Oral Daily  . aspirin EC  81 mg Oral Daily  . cefTRIAXone (ROCEPHIN)  IV  1 g Intravenous Q24H  . heparin  5,000 Units Subcutaneous Q8H  . ipratropium-albuterol  3 mL Nebulization Q6H WA  . pantoprazole  40 mg Oral Daily  . sodium chloride flush  3 mL Intravenous Q12H  . vancomycin  750 mg Intravenous Q24H   PRN Medications: acetaminophen **OR** acetaminophen  Assessment/Plan: Active Problems:   CKD (chronic kidney disease), stage III   Atherosclerotic peripheral vascular disease with gangrene (HCC)   Paroxysmal atrial fibrillation (HCC)  Rash  Pt is a 82 y.o. yo male with a PMHx of gout, chronic wounds secondary to peripheral vascular disease, paroxysmal atrial fibrillation and BPH who was admitted on 09/07/2016 with symptoms of a new leg rash. Leukocytosis is improving and he remains afebrile. We would like to biopsy this rash however he is hesitant  about letting us do anything more than using blood draw and inspection for diagnosis. We cannot exclude vasculitis. He does have elevated ESR but no overt signs of infection. We will continue vancomycin and keep monitoring the rash.   Abnormal urinalysis  He denies symptoms of UTI but urinalysis had leukocytes. He has a history of BPH. We have continued his home medication finasteride and will start ceftriaxone today.   CKD  His baseline creatinine is 2.1-2.8. SCrt during this admission has remained within this range.   Paroxysmal atrial fibrillation  He still has an irregular rhythm on exam today. We will continue his home medication aspirin 81 mg daily.   Hypokalemia  K 3.0 today. Ordered K-Dur 40 meq.  Follow up BMET tomorrow   Dispo: Anticipated discharge in approximately 1-2 day(s).   LOS: 1 day   Vincent Noss, MD 09/08/2016, 10:50 AM Pager: [phone]

## 2016-09-08 NOTE — Progress Notes (Signed)
[facility]  Patient Status: Active (receiving services up to time of hospitalization)  [facility] is providing the following services: RN  If patient discharges after hours, please call [phone].   Kizzie FurnishDonna Fellmy 09/08/2016, 11:02 AM

## 2016-09-09 ENCOUNTER — Inpatient Hospital Stay (HOSPITAL_COMMUNITY): Payer: Medicare Other

## 2016-09-09 DIAGNOSIS — R062 Wheezing: Secondary | ICD-10-CM

## 2016-09-09 DIAGNOSIS — Z87891 Personal history of nicotine dependence: Secondary | ICD-10-CM

## 2016-09-09 DIAGNOSIS — R0602 Shortness of breath: Secondary | ICD-10-CM

## 2016-09-09 DIAGNOSIS — I129 Hypertensive chronic kidney disease with stage 1 through stage 4 chronic kidney disease, or unspecified chronic kidney disease: Secondary | ICD-10-CM

## 2016-09-09 LAB — BASIC METABOLIC PANEL
ANION GAP: 5 (ref 5–15)
BUN: 27 mg/dL — ABNORMAL HIGH (ref 6–20)
CHLORIDE: 114 mmol/L — AB (ref 101–111)
CO2: 24 mmol/L (ref 22–32)
Calcium: 9 mg/dL (ref 8.9–10.3)
Creatinine, Ser: 2.04 mg/dL — ABNORMAL HIGH (ref 0.61–1.24)
GFR calc non Af Amer: 27 mL/min — ABNORMAL LOW (ref >60)
GFR, EST AFRICAN AMERICAN: 32 mL/min — AB (ref >60)
Glucose, Bld: 115 mg/dL — ABNORMAL HIGH (ref 65–99)
POTASSIUM: 3.4 mmol/L — AB (ref 3.5–5.1)
SODIUM: 143 mmol/L (ref 135–145)

## 2016-09-09 LAB — CBC
HEMATOCRIT: 28.7 % — AB (ref 39.0–52.0)
Hemoglobin: 9.4 g/dL — ABNORMAL LOW (ref 13.0–17.0)
MCH: 27.1 pg (ref 26.0–34.0)
MCHC: 32.8 g/dL (ref 30.0–36.0)
MCV: 82.7 fL (ref 78.0–100.0)
PLATELETS: 242 10*3/uL (ref 150–400)
RBC: 3.47 MIL/uL — AB (ref 4.22–5.81)
RDW: 15.1 % (ref 11.5–15.5)
WBC: 13 10*3/uL — AB (ref 4.0–10.5)

## 2016-09-09 LAB — URINE CULTURE: Culture: 20000 — AB

## 2016-09-09 MED ORDER — HYDRALAZINE HCL 20 MG/ML IJ SOLN: 10.0000 mg | Freq: Three times a day (TID) | INTRAMUSCULAR | Status: DC | PRN

## 2016-09-09 MED ORDER — IPRATROPIUM-ALBUTEROL 0.5-2.5 (3) MG/3ML IN SOLN: 3.0000 mL | RESPIRATORY_TRACT | Status: DC | PRN

## 2016-09-09 MED ORDER — LABETALOL HCL 5 MG/ML IV SOLN
10.0000 mg | Freq: Once | INTRAVENOUS | Status: AC
  Administered 2016-09-09: 10 mg via INTRAVENOUS
  Filled 2016-09-09: qty 4

## 2016-09-09 MED ORDER — IPRATROPIUM-ALBUTEROL 0.5-2.5 (3) MG/3ML IN SOLN: 3.0000 mL | Freq: Three times a day (TID) | RESPIRATORY_TRACT | Status: DC

## 2016-09-09 MED ORDER — PREDNISONE 20 MG PO TABS: 40.0000 mg | ORAL_TABLET | Freq: Every day | ORAL | Status: DC

## 2016-09-09 MED ORDER — METHYLPREDNISOLONE SODIUM SUCC 125 MG IJ SOLR
60.0000 mg | Freq: Once | INTRAMUSCULAR | Status: AC
  Administered 2016-09-09: 60 mg via INTRAVENOUS
  Filled 2016-09-09: qty 2

## 2016-09-09 MED ORDER — POTASSIUM CHLORIDE CRYS ER 20 MEQ PO TBCR
40.0000 meq | EXTENDED_RELEASE_TABLET | Freq: Every day | ORAL | Status: DC
  Administered 2016-09-09 – 2016-09-10 (×2): 40 meq via ORAL
  Filled 2016-09-09 (×2): qty 2

## 2016-09-09 MED ORDER — ALLOPURINOL 100 MG PO TABS
100.0000 mg | ORAL_TABLET | Freq: Two times a day (BID) | ORAL | Status: DC
  Administered 2016-09-09 – 2016-09-10 (×2): 100 mg via ORAL
  Filled 2016-09-09 (×3): qty 1

## 2016-09-09 MED ORDER — IPRATROPIUM-ALBUTEROL 0.5-2.5 (3) MG/3ML IN SOLN
3.0000 mL | RESPIRATORY_TRACT | Status: DC
  Administered 2016-09-09: 3 mL via RESPIRATORY_TRACT
  Filled 2016-09-09: qty 3

## 2016-09-09 MED ORDER — AMLODIPINE BESYLATE 5 MG PO TABS
5.0000 mg | ORAL_TABLET | Freq: Every day | ORAL | Status: DC
  Administered 2016-09-09 – 2016-09-10 (×2): 5 mg via ORAL
  Filled 2016-09-09 (×2): qty 1

## 2016-09-09 MED ORDER — AMOXICILLIN-POT CLAVULANATE 875-125 MG PO TABS
1.0000 | ORAL_TABLET | Freq: Two times a day (BID) | ORAL | Status: DC
Start: 2016-09-09 — End: 2016-09-10
  Administered 2016-09-10: 1 via ORAL
  Filled 2016-09-09 (×2): qty 1

## 2016-09-09 NOTE — Progress Notes (Signed)
Spoke with wife last night. Patient and wife are eager to get discharge this Saturday. Wife is somewhat irritated about patients diagnosis. Told wife that she needed to speak with the MD.

## 2016-09-09 NOTE — Progress Notes (Signed)
Neb treatments ordered by MD. Pt has clear/diminished BBS with some fine crackles. Pt does have some wheezing, but seems to only be upper airway. Per pt's spouse, pt has not had his scheduled Lasix in a few days. RT will continue to monitor.

## 2016-09-09 NOTE — Progress Notes (Signed)
Elevated BP and Respirations. [phone]. Doctor notified.

## 2016-09-09 NOTE — Progress Notes (Signed)
Subjective:  No events overnight. He tells me that overnight he developed some shortness of breath. Does report a cough but with no sputum production. Some wheezing as well. No fevers overnight. Not requiring any supplemental O2. He denies any history of asthma or COPD. He is a former smoker. No inhalers at home. Denies any headaches. No pain in his leg.   Objective:  Vital signs in last 24 hours: Vitals:   09/09/16 0809 09/09/16 0836 09/09/16 0905 09/09/16 0948  BP: (!) 191/96 (!) 190/96 (!) 175/92   Pulse: 98 96 86   Resp: (!) 32 (!) 36 19   Temp:   98.4 F (36.9 C)   TempSrc:   Oral   SpO2: 92% 92% 96% 94%  Weight:      Height:       Physical Exam  Constitutional: He is sitting up in bed. Talking in short sentences, unable to catch breath fully. Mild respiratory distress.  Cardiovascular: Tachycardic and irregularly irregular. No murmur heard. Pulmonary/Chest: Diminished breath sounds throughout with decreased air movement. Scattered wheezing, mostly in upper lung fields. Bibasilar crackles present.  Abdominal: Soft. Bowel sounds are normal. He exhibits no distension. There is no tenderness.  Neurological: He is alert.  Extremities: no calf tenderness, both lower extremities with warmth L>R, LLE with clearing erythematous, maculopapular rash and only scattered areas on medial knee, shin and lateral thigh  Assessment/Plan:  Sepsis likely secondary to underlying chronic LE wounds with likely chronic osteomyelitis His leukocytosis is resolving since admission, 22>14 today, has been afebrile during admission, lactate is resolved. His LLE rash is resolving today. Very atypical appearance but given improvement with ABX may have been an atypical cellulitis though remains unclear. The most likely source of his infection is his chronic wounds on his bilateral feet with likely underlying osteomyelitis. ESR and CRP were both elevated on admission. He was evaluated by both Dr. Sharol Given and Dr.  Ninfa Linden at previous hospitalizations for the extensive ulceration of his left foot. They both recommended amputation. Patient and his wife does not want to pursue that option. Discussed case with Dr. Tommy Medal who recommended Augmentin therapy for coverage of typical bacteria and minimizing C. Diff risk.  -Will D/C vancomycin and ceftriaxone -Start Augmentin 875 q12 hours x 14 days total (end date 11/2) -Blood cultures NGTD  Shortness of breath with wheezing Developed shortness of breath and wheezing overnight. Non-productive cough. No known history of COPD or asthma. CXR on admission with some bibasilar atelectasis and hyperinflation. This morning on exam he had diminished breath sounds throughout with decreased air movement. Scattered wheezing, mostly in upper lung fields. Bibasilar crackles present. Ordered for Duonebs treatment and solumedrol. Minimal subjective improvement per patient, however on exam lung with better air movement but still scattered wheezing.  He is a former smoker and CXR with hyperinflated lungs. Suspect underlying COPD. Received labetalol this morning prior to being aware of wheezing which may have contributed to his lung tightness on exam but he reports symptoms began over night. Will avoid BB in the future.  -Duonebs q4hr while awake -Solumedrol 60 x 1, prednisone 40 daily x 4 days thereafter -Repeat CXR with bibasilar atelectases but no acute changes or volume overload -Incentive spirometery   HTN Not on any home medications for HTN. BP have been 150-160 since admission. This morning, BP was elevated to the 086V systolic. Received Labetalol 10 mg IV with improvement to 175/92. Given CKD III would avoid ACE-I/ARB. Will start Amlodipine today and have  hydralazine 10 mg IV prn SBP > 180. -Amlodipine 5 mg daily -Hydral prn SBP > 180  AKI on CKD stage III Patient with baseline Cr of approx 1.6 - 1.8. Cr 2.2 >2.03 today.  - Likely secondary to sepsis. Will continue to  monitor for now. - If no improvement would consider renal sono and calculating Fe urea  Hypokalemia  K 3.4 this morning. KDUR 40 PO.  Dispo: Anticipated discharge in approximately 1 day(s).   Maryellen Pile, MD 09/09/2016, 12:01 PM Pager: [phone]

## 2016-09-10 DIAGNOSIS — I10 Essential (primary) hypertension: Secondary | ICD-10-CM | POA: Diagnosis present

## 2016-09-10 DIAGNOSIS — R441 Visual hallucinations: Secondary | ICD-10-CM

## 2016-09-10 LAB — BASIC METABOLIC PANEL
Anion gap: 6 (ref 5–15)
BUN: 23 mg/dL — AB (ref 6–20)
CALCIUM: 9.3 mg/dL (ref 8.9–10.3)
CO2: 23 mmol/L (ref 22–32)
CREATININE: 1.85 mg/dL — AB (ref 0.61–1.24)
Chloride: 114 mmol/L — ABNORMAL HIGH (ref 101–111)
GFR calc Af Amer: 36 mL/min — ABNORMAL LOW (ref >60)
GFR, EST NON AFRICAN AMERICAN: 31 mL/min — AB (ref >60)
Glucose, Bld: 136 mg/dL — ABNORMAL HIGH (ref 65–99)
POTASSIUM: 4.2 mmol/L (ref 3.5–5.1)
SODIUM: 143 mmol/L (ref 135–145)

## 2016-09-10 MED ORDER — ALBUTEROL SULFATE HFA 108 (90 BASE) MCG/ACT IN AERS: 2.0000 | INHALATION_SPRAY | Freq: Four times a day (QID) | RESPIRATORY_TRACT | 2 refills | Status: DC | PRN

## 2016-09-10 MED ORDER — AMLODIPINE BESYLATE 5 MG PO TABS: 5.0000 mg | ORAL_TABLET | Freq: Every day | ORAL | 0 refills | Status: DC

## 2016-09-10 MED ORDER — SILVER SULFADIAZINE 1 % EX CREA: TOPICAL_CREAM | Freq: Every day | CUTANEOUS | 0 refills | Status: DC

## 2016-09-10 MED ORDER — AMOXICILLIN-POT CLAVULANATE 875-125 MG PO TABS: 1.0000 | ORAL_TABLET | Freq: Two times a day (BID) | ORAL | 0 refills | Status: DC

## 2016-09-10 NOTE — Progress Notes (Signed)
Patient discharge teaching given, including activity, diet, follow-up appoints, and medications. Patient verbalized understanding of all discharge instructions. IV access was d/c'd. Vitals are stable. Skin is intact except as charted in most recent assessments. Pt to be escorted out by NT, to be driven home by family.  Madge Therrien, MBA, BSN, RN 

## 2016-09-10 NOTE — Discharge Summary (Signed)
Name: Vincent PigeonRobert T Miranda MRN: [id number] DOB: March 18, 1925 82 y.o. PCP: Gwynn BurlyAndrew Wallace, DO  Date of Admission: 09/07/2016  2:07 PM Date of Discharge: 09/10/2016 Attending Physician: Earl LagosNischal Narendra, MD Discharge Diagnosis: Principal Problem:   Sepsis (HCC) Active Problems:   CKD (chronic kidney disease), stage III   Atherosclerotic peripheral vascular disease with gangrene (HCC)   Paroxysmal atrial fibrillation (HCC)   Rash and nonspecific skin eruption   Abnormal urinalysis   Wheezing   Shortness of breath   Hypertension   Visual hallucinations   Discharge Medications:   Medication List    TAKE these medications   albuterol 108 (90 Base) MCG/ACT inhaler Commonly known as:  PROVENTIL HFA;VENTOLIN HFA Inhale 2 puffs into the lungs every 6 (six) hours as needed for wheezing or shortness of breath.   allopurinol 100 MG tablet Commonly known as:  ZYLOPRIM Take 2 tablets (200 mg total) by mouth daily.   amLODipine 5 MG tablet Commonly known as:  NORVASC Take 1 tablet (5 mg total) by mouth daily.   amoxicillin-clavulanate 875-125 MG tablet Commonly known as:  AUGMENTIN Take 1 tablet by mouth every 12 (twelve) hours.   aspirin EC 81 MG tablet Take 81 mg by mouth daily.   finasteride 5 MG tablet Commonly known as:  PROSCAR Take 1 tablet (5 mg total) by mouth daily.   furosemide 40 MG tablet Commonly known as:  LASIX Take 1 tablet (40 mg total) by mouth daily.   pantoprazole 40 MG tablet Commonly known as:  PROTONIX Take 1 tablet (40 mg total) by mouth daily.   silver sulfADIAZINE 1 % cream Commonly known as:  SILVADENE Apply topically daily.   Zinc 50 MG Tabs Take 50 mg by mouth daily.       Disposition and follow-up:   [facility]  At the hospital follow up visit please address:  1. Chronic Leg Wounds and Osteomyelitis - Most likely source of infection. Discharged on  Augment x 2 weeks.  2. HTN - Started on Amlodipine 5 mg. Please address and titrate as needed.  3. SOB/Wheezing - suspect underlying COPD. Would recommend outpatient PFTs. Developed visual hallucinations with steroids, would avoid in the future if possible.  Labs / imaging needed at time of follow-up: none Pending labs/ test needing follow-up: none  Follow-up Appointments: Follow-up Information    [facility]. Schedule an appointment as soon as possible for a visit in 1 week(s).   Why:  If you do not hear from us tomorrow, please call and schedule an appointment for Thursday or Friday of this week. Contact information: [address] [place] [id number] [phone]       [facility] .   Why:  Home Health Resumption HHRN Contact information: [address] [place] [id number] [phone]           Hospital Course by problem list: Principal Problem:   Sepsis (HCC) Active Problems:   CKD (chronic kidney disease), stage III   Atherosclerotic peripheral vascular disease with gangrene (HCC)   Paroxysmal atrial fibrillation (HCC)   Rash and nonspecific skin eruption   Abnormal urinalysis   Wheezing   Shortness of breath   Hypertension   Visual hallucinations   Sepsis likely secondary to underlying chronic LE wounds with likely chronic osteomyelitis Initially presented with reported fevers at home and found to have tachycardia, leukocytosis, lactic acidosis on arrival. Remained  afebrile during hospitalization. He also had a rash across LLE that resolved following ABX. Did not have the appearance of classic cellulitis and he refused skin biopsy. See media tab for pictures. He was started on Vanc and Zosyn for sepsis.CXR and UA were not impressive for infection. Felt to be the most likely source his chronic wounds with underlying chronic osteomyelitis. Discussed case briefly with Dr. Daiva Eves  who recommended  Augmentin therapy for coverage of typical bacteria and minimizing C. Diff risk. Urine culture on admission growing 20,000 bacteria. Culture showing proteus that is pan-sensitive. Denied any urinary symptoms. Blood cultures with no growth, final. Discharged on Augmentin for 2 week weeks.  This is likely going to continue to be recurrent with frequent hospitalizations until the source is controlled but patient and wife have been adamant about not pursuing surgical intervention.   Shortness of breath with wheezing Developed shortness of breath and wheezing after admission. Received a dose of labetalol for HTN and symptoms appeared to worsen. Received duonebs and steroids. Began having hallucinations overnight after receiving steroids and they were discontinued. Suspect underlying COPD given history of smoking and CXR. Discharged with albuterol inhaler. Would benefit from outpatient PFTs. Would avoid steroids if possible in the future.   Visual Hallucinations Received Solumedrol 60 mg for above. The following morning he reported seeing black snakes moving in the chair across from his bed. Suspect this was steroid induced. Stopped prednisone. Would try to avoid in the future if possible.   HTN SBP were in the 150-160s initially. Climbed to the 190s. Given CKD III would avoid ACE-I/ARB. Likely has underlying COPD, would also avoid BBs. Started on Amlodipine 5 mg. BPs were somewhat improved after starting this. Will need further titration outpatient.   AKI on CKD stage III Cr initially 2.2 on admission. Cr returned to baseline 1.8 prior to discharge.   Discharge Vitals:   BP (!) 166/109 (BP Location: Left Arm) Comment: nurse notified  Pulse (!) 104   Temp 99.3 F (37.4 C) (Oral)   Resp (!) 21   Ht 5\' 11"  (1.803 m)   Wt 164 lb 14.5 oz (74.8 kg)   SpO2 99%   BMI 23.00 kg/m   Discharge Instructions: Discharge Instructions    Call MD for:  redness, tenderness, or signs of infection (pain,  swelling, redness, odor or green/yellow discharge around incision site)    Complete by:  As directed    Call MD for:  severe uncontrolled pain    Complete by:  As directed    Call MD for:  temperature >100.4    Complete by:  As directed    Diet - low sodium heart healthy    Complete by:  As directed    Increase activity slowly    Complete by:  As directed       Signed: Valentino Nose, MD 09/12/2016, 9:01 PM   Pager: [phone]

## 2016-09-10 NOTE — Progress Notes (Signed)
   Subjective:  Patient began refusing medications last night. This morning, he is very calm but tells me he is seeing black snakes in the room. He does not appear to be concerned over the snakes but rather concerned about me because I cannot see them. He then tells me that he has gotten a bad report that he is not taking his medications and that is not true. Did receive prednisone yesterday with his wheezing. Reports his breathing is minimally changed.   Objective:  Vital signs in last 24 hours: Vitals:   09/09/16 1325 09/09/16 1804 09/09/16 2046 09/10/16 0944  BP: (!) 183/101 (!) 159/81 (!) 194/96 (!) 166/109  Pulse: 88 90 91 (!) 104  Resp:  18 18 (!) 21  Temp:  98.7 F (37.1 C) 98.6 F (37 C) 99.3 F (37.4 C)  TempSrc:  Oral Oral Oral  SpO2: 93% 97% 97% 99%  Weight:      Height:       Physical Exam Constitutional: He is sitting up in bed. Reporting black snakes moving in the chair across the room. Cardiovascular: Tachycardic. No murmurheard. Pulmonary/Chest: Diminished breath sounds at lung bases. Moving adequate air. Scattered wheezing, mostly in upper lung fields. Bibasilar crackles present.  Abdominal: Soft. Bowel sounds are normal. He exhibits no distension. There is no tenderness.  Neurological: He is alert.  Extremities: no calf tenderness, both lower extremities with warmth L>R, LLE rash resolving   Assessment/Plan:  Sepsis likely secondary to underlying chronic LE wounds with likely chronic osteomyelitis No further fevers. Rash is resolving.  Discussed case with Dr. Daiva [place]vesVan Dam yesterday who recommended Augmentin therapy for coverage of typical bacteria and minimizing C. Diff risk. This is likely going to continue to be recurrent with frequent hospitalizations until the source is controlled but patient and wife have been adamant about not pursuing surgical intervention. Urine culture on admission growing 20,000 bacteria. Culture showing proteus that is pan-sensitive. Denies  any urinary symptoms.  -Continue Augmentin 875 q12 hours x 14 days total (end date 11/2) -Blood cultures NG 2 days  Shortness of breath with wheezing Continues to have some wheezing and airway tightness on exam this morning. Improving from yesterday. Suspect underlying COPD given history of smoking and CXR. Developed visual hallucinations after receiving steroids yesterday. Will stop steroids and continue nebulizer treatments today.  -Duonebs q4hr while awake -D/C steroids -Incentive spirometery   Visual Hallucinations Received Solumedrol 60 mg yesterday for above. This morning he reports seeing black snakes moving in the chair across from his bed. Suspect this is steroid induced. Will stop steroid today (none received yet today) and monitor. No auditory hallucinations. He seems undisturbed by the hallucinations and is not violent. He is somewhat confused and only alert to person this morning.  -D/C prednisone   HTN SBP was 190 last night. He refused any blood pressure medications at that time. Refused vital checks until this morning. BP 166/109 this morning. Given CKD III would avoid ACE-I/ARB. Likely has underlying COPD, will avoid BBs. Will continue Amlodipine today and have hydralazine 10 mg IV prn SBP > 180. -Amlodipine 5 mg daily -Hydral prn SBP > 180  AKI on CKD stage III Patient with baseline Cr of approx 1.6 - 1.8. Cr back to baseline 1.8 today.   Hypokalemia  Resolved   Dispo: Anticipated discharge in approximately 1-2 day(s).   Vincent NoseNathan Kionte Baumgardner, MD 09/10/2016, 12:44 PM Pager: [phone]

## 2016-09-10 NOTE — Progress Notes (Signed)
Patient blood pressure  Elevated at 198/90 manually. standing iv medication for hydralazine offered patient continues to refuses to  take any of his medications, treating physician paged to relay BP values at this time. RN awaiting  return call

## 2016-09-10 NOTE — Discharge Instructions (Signed)
I am prescribing you an antibiotic called Augmentin. It is twice a day for the next 2 weeks. Please take it starting tonight until you are out of pills. I am also starting you on a new blood pressure medication called Amlodipine. It is once day. We will re-check your blood pressures at follow up and adjust as needed. I am also going to give you a inhaler. If you have shortness of breath or wheezing, take 2 puffs.    How to Use an Inhaler Proper inhaler technique is very important. Good technique ensures that the medicine reaches the lungs. Poor technique results in depositing the medicine on the tongue and back of the throat rather than in the airways. If you do not use the inhaler with good technique, the medicine will not help you. STEPS TO FOLLOW IF USING AN INHALER WITHOUT AN EXTENSION TUBE 1. Remove the cap from the inhaler. 2. If you are using the inhaler for the first time, you will need to prime it. Shake the inhaler for 5 seconds and release four puffs into the air, away from your face. Ask your health care provider or pharmacist if you have questions about priming your inhaler. 3. Shake the inhaler for 5 seconds before each breath in (inhalation). 4. Position the inhaler so that the top of the canister faces up. 5. Put your index finger on the top of the medicine canister. Your thumb supports the bottom of the inhaler. 6. Open your mouth. 7. Either place the inhaler between your teeth and place your lips tightly around the mouthpiece, or hold the inhaler 1-2 inches away from your open mouth. If you are unsure of which technique to use, ask your health care provider. 8. Breathe out (exhale) normally and as completely as possible. 9. Press the canister down with your index finger to release the medicine. 10. At the same time as the canister is pressed, inhale deeply and slowly until your lungs are completely filled. This should take 4-6 seconds. Keep your tongue down. 11. Hold the medicine  in your lungs for 5-10 seconds (10 seconds is best). This helps the medicine get into the small airways of your lungs. 12. Breathe out slowly, through pursed lips. Whistling is an example of pursed lips. 13. Wait at least 15-30 seconds between puffs. Continue with the above steps until you have taken the number of puffs your health care provider has ordered. Do not use the inhaler more than your health care provider tells you. 14. Replace the cap on the inhaler. 15. Follow the directions from your health care provider or the inhaler insert for cleaning the inhaler. STEPS TO FOLLOW IF USING AN INHALER WITH AN EXTENSION (SPACER) 1. Remove the cap from the inhaler. 2. If you are using the inhaler for the first time, you will need to prime it. Shake the inhaler for 5 seconds and release four puffs into the air, away from your face. Ask your health care provider or pharmacist if you have questions about priming your inhaler. 3. Shake the inhaler for 5 seconds before each breath in (inhalation). 4. Place the open end of the spacer onto the mouthpiece of the inhaler. 5. Position the inhaler so that the top of the canister faces up and the spacer mouthpiece faces you. 6. Put your index finger on the top of the medicine canister. Your thumb supports the bottom of the inhaler and the spacer. 7. Breathe out (exhale) normally and as completely as possible. 8. Immediately after  exhaling, place the spacer between your teeth and into your mouth. Close your lips tightly around the spacer. 9. Press the canister down with your index finger to release the medicine. 10. At the same time as the canister is pressed, inhale deeply and slowly until your lungs are completely filled. This should take 4-6 seconds. Keep your tongue down and out of the way. 11. Hold the medicine in your lungs for 5-10 seconds (10 seconds is best). This helps the medicine get into the small airways of your lungs. Exhale. 12. Repeat inhaling  deeply through the spacer mouthpiece. Again hold that breath for up to 10 seconds (10 seconds is best). Exhale slowly. If it is difficult to take this second deep breath through the spacer, breathe normally several times through the spacer. Remove the spacer from your mouth. 13. Wait at least 15-30 seconds between puffs. Continue with the above steps until you have taken the number of puffs your health care provider has ordered. Do not use the inhaler more than your health care provider tells you. 14. Remove the spacer from the inhaler, and place the cap on the inhaler. 15. Follow the directions from your health care provider or the inhaler insert for cleaning the inhaler and spacer. If you are using different kinds of inhalers, use your quick relief medicine to open the airways 10-15 minutes before using a steroid if instructed to do so by your health care provider. If you are unsure which inhalers to use and the order of using them, ask your health care provider, nurse, or respiratory therapist. If you are using a steroid inhaler, always rinse your mouth with water after your last puff, then gargle and spit out the water. Do not swallow the water. AVOID:  Inhaling before or after starting the spray of medicine. It takes practice to coordinate your breathing with triggering the spray.  Inhaling through the nose (rather than the mouth) when triggering the spray. HOW TO DETERMINE IF YOUR INHALER IS FULL OR NEARLY EMPTY You cannot know when an inhaler is empty by shaking it. A few inhalers are now being made with dose counters. Ask your health care provider for a prescription that has a dose counter if you feel you need that extra help. If your inhaler does not have a counter, ask your health care provider to help you determine the date you need to refill your inhaler. Write the refill date on a calendar or your inhaler canister. Refill your inhaler 7-10 days before it runs out. Be sure to keep an adequate  supply of medicine. This includes making sure it is not expired, and that you have a spare inhaler.  SEEK MEDICAL CARE IF:   Your symptoms are only partially relieved with your inhaler.  You are having trouble using your inhaler.  You have some increase in phlegm. SEEK IMMEDIATE MEDICAL CARE IF:   You feel little or no relief with your inhalers. You are still wheezing and are feeling shortness of breath or tightness in your chest or both.  You have dizziness, headaches, or a fast heart rate.  You have chills, fever, or night sweats.  You have a noticeable increase in phlegm production, or there is blood in the phlegm. MAKE SURE YOU:   Understand these instructions.  Will watch your condition.  Will get help right away if you are not doing well or get worse.   This information is not intended to replace advice given to you by your health  care provider. Make sure you discuss any questions you have with your health care provider.   Document Released: 11/03/2000 Document Revised: 08/27/2013 Document Reviewed: 06/05/2013 Elsevier Interactive Patient Education [employer]2016 [employer] Inc.

## 2016-09-11 DIAGNOSIS — L97311 Non-pressure chronic ulcer of right ankle limited to breakdown of skin: Secondary | ICD-10-CM | POA: Diagnosis not present

## 2016-09-11 DIAGNOSIS — L97321 Non-pressure chronic ulcer of left ankle limited to breakdown of skin: Secondary | ICD-10-CM | POA: Diagnosis not present

## 2016-09-11 DIAGNOSIS — L97411 Non-pressure chronic ulcer of right heel and midfoot limited to breakdown of skin: Secondary | ICD-10-CM | POA: Diagnosis not present

## 2016-09-11 DIAGNOSIS — I739 Peripheral vascular disease, unspecified: Secondary | ICD-10-CM | POA: Diagnosis not present

## 2016-09-11 DIAGNOSIS — L97421 Non-pressure chronic ulcer of left heel and midfoot limited to breakdown of skin: Secondary | ICD-10-CM | POA: Diagnosis not present

## 2016-09-11 DIAGNOSIS — I129 Hypertensive chronic kidney disease with stage 1 through stage 4 chronic kidney disease, or unspecified chronic kidney disease: Secondary | ICD-10-CM | POA: Diagnosis not present

## 2016-09-12 LAB — CULTURE, BLOOD (ROUTINE X 2)
Culture: NO GROWTH
Culture: NO GROWTH

## 2016-09-14 ENCOUNTER — Telehealth: Payer: Self-pay | Admitting: *Deleted

## 2016-09-14 DIAGNOSIS — L97321 Non-pressure chronic ulcer of left ankle limited to breakdown of skin: Secondary | ICD-10-CM | POA: Diagnosis not present

## 2016-09-14 DIAGNOSIS — I739 Peripheral vascular disease, unspecified: Secondary | ICD-10-CM | POA: Diagnosis not present

## 2016-09-14 DIAGNOSIS — L97421 Non-pressure chronic ulcer of left heel and midfoot limited to breakdown of skin: Secondary | ICD-10-CM | POA: Diagnosis not present

## 2016-09-14 DIAGNOSIS — I129 Hypertensive chronic kidney disease with stage 1 through stage 4 chronic kidney disease, or unspecified chronic kidney disease: Secondary | ICD-10-CM | POA: Diagnosis not present

## 2016-09-14 DIAGNOSIS — L97411 Non-pressure chronic ulcer of right heel and midfoot limited to breakdown of skin: Secondary | ICD-10-CM | POA: Diagnosis not present

## 2016-09-14 DIAGNOSIS — L97311 Non-pressure chronic ulcer of right ankle limited to breakdown of skin: Secondary | ICD-10-CM | POA: Diagnosis not present

## 2016-09-14 NOTE — Telephone Encounter (Signed)
Spoke w/ tracy HHRN again, she has spoken with pt's lead nurse, HH will have to end visits due to documented noncompliance of compression drsgs being removed, possibly speaking with pt about a palliative care program would be best or hospice, she was hoping there would be a way to help the pt but insurance will not approve, tomorrow she will make the last visit and encourage spouse to bring pt in next week for a HFU appt in acc since next appt w/ pcp is dec, she will also remind them of the dec appt.

## 2016-09-14 NOTE — Telephone Encounter (Signed)
HHN calls and ask about plan for pt's wounds longterm, [facility] and pt's spouse ask why compression drsgs are not being used, wife is doing well with silvadene cream and gauze wrapping. Please advise French Anaracy snider HHRN [phone]

## 2016-09-14 NOTE — Telephone Encounter (Signed)
We are essentially treating wounds palliatively at this point as he has received at least two opinions from ortho that the feet are non-viable due to PAD, that the wounds will not heal, that the wounds track down to the bones.  We can try compression dressings to see if these provide any benefit and continue home RN dressing changes if possible.  Thanks.

## 2016-09-15 NOTE — Telephone Encounter (Signed)
Thank you for the update.  If we could try to have him come in for hospital f/u next week that would be ideal.  I think he will need close f/u to assess his wounds going forward and agree with possible palliative or hospice referral.  Thanks!

## 2016-09-18 NOTE — Telephone Encounter (Signed)
I spoke w/ pt for appr 15 mins, not getting anywhere with an appt, called his spouse at work and she stated she takes good care of him, checks his vital signs, takes care of his legs and if she thinks he needs to be seen she will decide when and where, she states they will keep the dec appt but she will not take another day off work for nothing

## 2016-10-06 ENCOUNTER — Encounter: Payer: Self-pay | Admitting: *Deleted

## 2016-10-10 ENCOUNTER — Encounter: Payer: Self-pay | Admitting: Licensed Clinical Social Worker

## 2016-10-10 ENCOUNTER — Telehealth: Payer: Self-pay | Admitting: Licensed Clinical Social Worker

## 2016-10-10 NOTE — Telephone Encounter (Signed)
CSW placed call to Vincent Miranda to discuss home based palliative care services.  Pt states his wife does everything for him and things are going fine.  [facility].  Pt voiced complaints regarding the cost of his hospitalization and states he does not believe in "preventative care" or coming in for medical appointments when nothing is wrong.  CSW discuss the goals and benefits of home based palliative care services.  Pt agreeable for CSW to discuss with wife, who just left for work.  CSW attempting spouse at work.  Message left requesting return call regarding a potential services available.  CSW left message if spouse unable to return call today, CSW will place information on service in the mail.

## 2016-10-10 NOTE — Telephone Encounter (Signed)
CSW received incoming call from pt's spouse.  Spouse aware HH services have been d/c'd.  CSW discussed benefits and goal of home based palliative care.  Spouse states at this time, they are comfortable with care needs and do not feel additional services are required.  Spouse agreeable to have information mailed and is aware services are available when needs arises.

## 2016-10-10 NOTE — Telephone Encounter (Signed)
CSW will contact patient/spouse to discuss home-based palliative care.  Please see CSW telephone note 10/10/16.

## 2016-10-10 NOTE — Telephone Encounter (Signed)
Would someone be willing to let wife know we can refer to palliative without [facility]? I would explain palliative as an extra set of eyes checking in on him but without the hassle of needing to come in for appt.

## 2016-10-23 ENCOUNTER — Other Ambulatory Visit: Payer: Self-pay | Admitting: Internal Medicine

## 2016-10-26 ENCOUNTER — Encounter: Payer: Medicare Other | Admitting: Internal Medicine

## 2016-10-26 ENCOUNTER — Ambulatory Visit: Payer: Medicare Other

## 2016-11-13 ENCOUNTER — Other Ambulatory Visit: Payer: Self-pay | Admitting: Student in an Organized Health Care Education/Training Program

## 2016-12-06 ENCOUNTER — Other Ambulatory Visit: Payer: Self-pay | Admitting: Internal Medicine

## 2016-12-07 ENCOUNTER — Encounter: Payer: Medicare Other | Admitting: Internal Medicine

## 2016-12-08 ENCOUNTER — Ambulatory Visit (INDEPENDENT_AMBULATORY_CARE_PROVIDER_SITE_OTHER): Payer: Medicare Other | Admitting: Internal Medicine

## 2016-12-08 ENCOUNTER — Encounter: Payer: Self-pay | Admitting: Internal Medicine

## 2016-12-08 ENCOUNTER — Encounter (HOSPITAL_COMMUNITY): Payer: Self-pay | Admitting: *Deleted

## 2016-12-08 ENCOUNTER — Inpatient Hospital Stay (HOSPITAL_COMMUNITY)
Admission: AD | Admit: 2016-12-08 | Discharge: 2016-12-11 | DRG: 603 | Disposition: A | Discharge status: Home / Self Care | Payer: Medicare Other | Source: Ambulatory Visit | Attending: Internal Medicine | Admitting: Internal Medicine

## 2016-12-08 ENCOUNTER — Observation Stay (HOSPITAL_COMMUNITY): Payer: Medicare Other

## 2016-12-08 VITALS — BP 92/68 | HR 68 | Temp 98.0°F | Wt 169.7 lb

## 2016-12-08 DIAGNOSIS — K5900 Constipation, unspecified: Secondary | ICD-10-CM | POA: Diagnosis present

## 2016-12-08 DIAGNOSIS — L02415 Cutaneous abscess of right lower limb: Secondary | ICD-10-CM | POA: Diagnosis present

## 2016-12-08 DIAGNOSIS — M86672 Other chronic osteomyelitis, left ankle and foot: Secondary | ICD-10-CM | POA: Diagnosis not present

## 2016-12-08 DIAGNOSIS — Z7982 Long term (current) use of aspirin: Secondary | ICD-10-CM | POA: Diagnosis not present

## 2016-12-08 DIAGNOSIS — M109 Gout, unspecified: Secondary | ICD-10-CM | POA: Diagnosis present

## 2016-12-08 DIAGNOSIS — M86671 Other chronic osteomyelitis, right ankle and foot: Secondary | ICD-10-CM | POA: Diagnosis not present

## 2016-12-08 DIAGNOSIS — B9689 Other specified bacterial agents as the cause of diseases classified elsewhere: Secondary | ICD-10-CM | POA: Diagnosis not present

## 2016-12-08 DIAGNOSIS — I70269 Atherosclerosis of native arteries of extremities with gangrene, unspecified extremity: Secondary | ICD-10-CM

## 2016-12-08 DIAGNOSIS — L03119 Cellulitis of unspecified part of limb: Secondary | ICD-10-CM

## 2016-12-08 DIAGNOSIS — D638 Anemia in other chronic diseases classified elsewhere: Secondary | ICD-10-CM | POA: Diagnosis present

## 2016-12-08 DIAGNOSIS — L03115 Cellulitis of right lower limb: Principal | ICD-10-CM | POA: Diagnosis present

## 2016-12-08 DIAGNOSIS — S81801A Unspecified open wound, right lower leg, initial encounter: Secondary | ICD-10-CM | POA: Diagnosis not present

## 2016-12-08 DIAGNOSIS — Z87891 Personal history of nicotine dependence: Secondary | ICD-10-CM

## 2016-12-08 DIAGNOSIS — I480 Paroxysmal atrial fibrillation: Secondary | ICD-10-CM | POA: Diagnosis present

## 2016-12-08 DIAGNOSIS — J449 Chronic obstructive pulmonary disease, unspecified: Secondary | ICD-10-CM | POA: Diagnosis present

## 2016-12-08 DIAGNOSIS — S91302A Unspecified open wound, left foot, initial encounter: Secondary | ICD-10-CM | POA: Diagnosis not present

## 2016-12-08 DIAGNOSIS — M8660 Other chronic osteomyelitis, unspecified site: Secondary | ICD-10-CM | POA: Diagnosis present

## 2016-12-08 DIAGNOSIS — M869 Osteomyelitis, unspecified: Secondary | ICD-10-CM | POA: Diagnosis present

## 2016-12-08 DIAGNOSIS — I70261 Atherosclerosis of native arteries of extremities with gangrene, right leg: Secondary | ICD-10-CM | POA: Diagnosis present

## 2016-12-08 DIAGNOSIS — Z9119 Patient's noncompliance with other medical treatment and regimen: Secondary | ICD-10-CM

## 2016-12-08 DIAGNOSIS — R062 Wheezing: Secondary | ICD-10-CM

## 2016-12-08 DIAGNOSIS — N400 Enlarged prostate without lower urinary tract symptoms: Secondary | ICD-10-CM | POA: Diagnosis present

## 2016-12-08 DIAGNOSIS — I70263 Atherosclerosis of native arteries of extremities with gangrene, bilateral legs: Secondary | ICD-10-CM | POA: Diagnosis not present

## 2016-12-08 DIAGNOSIS — L02419 Cutaneous abscess of limb, unspecified: Secondary | ICD-10-CM

## 2016-12-08 DIAGNOSIS — S81802A Unspecified open wound, left lower leg, initial encounter: Secondary | ICD-10-CM | POA: Diagnosis not present

## 2016-12-08 DIAGNOSIS — L0390 Cellulitis, unspecified: Secondary | ICD-10-CM

## 2016-12-08 DIAGNOSIS — N183 Chronic kidney disease, stage 3 (moderate): Secondary | ICD-10-CM | POA: Diagnosis present

## 2016-12-08 DIAGNOSIS — K219 Gastro-esophageal reflux disease without esophagitis: Secondary | ICD-10-CM | POA: Diagnosis present

## 2016-12-08 DIAGNOSIS — Z79899 Other long term (current) drug therapy: Secondary | ICD-10-CM

## 2016-12-08 DIAGNOSIS — I129 Hypertensive chronic kidney disease with stage 1 through stage 4 chronic kidney disease, or unspecified chronic kidney disease: Secondary | ICD-10-CM | POA: Diagnosis present

## 2016-12-08 DIAGNOSIS — S91301A Unspecified open wound, right foot, initial encounter: Secondary | ICD-10-CM | POA: Diagnosis not present

## 2016-12-08 LAB — CBC WITH DIFFERENTIAL/PLATELET
BASOS ABS: 0 10*3/uL (ref 0.0–0.1)
BASOS PCT: 0 %
Basophils Absolute: 0 10*3/uL (ref 0.0–0.1)
Basophils Relative: 0 %
Eosinophils Absolute: 0.1 10*3/uL (ref 0.0–0.7)
Eosinophils Absolute: 0.2 10*3/uL (ref 0.0–0.7)
Eosinophils Relative: 1 %
Eosinophils Relative: 2 %
HCT: 29.2 % — ABNORMAL LOW (ref 39.0–52.0)
HEMATOCRIT: 30.7 % — AB (ref 39.0–52.0)
HEMOGLOBIN: 9.6 g/dL — AB (ref 13.0–17.0)
HEMOGLOBIN: 9.1 g/dL — AB (ref 13.0–17.0)
Lymphocytes Relative: 16 %
Lymphocytes Relative: 19 %
Lymphs Abs: 1.4 10*3/uL (ref 0.7–4.0)
Lymphs Abs: 1.4 10*3/uL (ref 0.7–4.0)
MCH: 26.4 pg (ref 26.0–34.0)
MCH: 26.5 pg (ref 26.0–34.0)
MCHC: 31.3 g/dL (ref 30.0–36.0)
MCHC: 31.2 g/dL (ref 30.0–36.0)
MCV: 84.6 fL (ref 78.0–100.0)
MCV: 85.1 fL (ref 78.0–100.0)
MONOS PCT: 9 %
MONOS PCT: 9 %
Monocytes Absolute: 0.8 10*3/uL (ref 0.1–1.0)
Monocytes Absolute: 0.7 10*3/uL (ref 0.1–1.0)
NEUTROS ABS: 6.5 10*3/uL (ref 1.7–7.7)
NEUTROS ABS: 5 10*3/uL (ref 1.7–7.7)
NEUTROS PCT: 74 %
NEUTROS PCT: 70 %
Platelets: 275 10*3/uL (ref 150–400)
Platelets: 244 10*3/uL (ref 150–400)
RBC: 3.63 MIL/uL — ABNORMAL LOW (ref 4.22–5.81)
RBC: 3.43 MIL/uL — AB (ref 4.22–5.81)
RDW: 17.4 % — ABNORMAL HIGH (ref 11.5–15.5)
RDW: 17.3 % — ABNORMAL HIGH (ref 11.5–15.5)
WBC: 8.8 10*3/uL (ref 4.0–10.5)
WBC: 7.3 10*3/uL (ref 4.0–10.5)

## 2016-12-08 LAB — COMPREHENSIVE METABOLIC PANEL
ALBUMIN: 2.2 g/dL — AB (ref 3.5–5.0)
ALK PHOS: 67 U/L (ref 38–126)
ALT: 7 U/L — AB (ref 17–63)
AST: 12 U/L — AB (ref 15–41)
Anion gap: 10 (ref 5–15)
BILIRUBIN TOTAL: 0.2 mg/dL — AB (ref 0.3–1.2)
BUN: 22 mg/dL — AB (ref 6–20)
CALCIUM: 9.7 mg/dL (ref 8.9–10.3)
CO2: 26 mmol/L (ref 22–32)
Chloride: 107 mmol/L (ref 101–111)
Creatinine, Ser: 1.75 mg/dL — ABNORMAL HIGH (ref 0.61–1.24)
GFR calc Af Amer: 38 mL/min — ABNORMAL LOW (ref >60)
GFR calc non Af Amer: 33 mL/min — ABNORMAL LOW (ref >60)
GLUCOSE: 100 mg/dL — AB (ref 65–99)
Potassium: 4.2 mmol/L (ref 3.5–5.1)
Sodium: 143 mmol/L (ref 135–145)
TOTAL PROTEIN: 7.7 g/dL (ref 6.5–8.1)

## 2016-12-08 LAB — GLUCOSE, CAPILLARY: Glucose-Capillary: 104 mg/dL — ABNORMAL HIGH (ref 65–99)

## 2016-12-08 LAB — BASIC METABOLIC PANEL
Anion gap: 6 (ref 5–15)
BUN: 21 mg/dL — ABNORMAL HIGH (ref 6–20)
CALCIUM: 10 mg/dL (ref 8.9–10.3)
CO2: 27 mmol/L (ref 22–32)
CREATININE: 1.77 mg/dL — AB (ref 0.61–1.24)
Chloride: 111 mmol/L (ref 101–111)
GFR calc Af Amer: 37 mL/min — ABNORMAL LOW (ref >60)
GFR calc non Af Amer: 32 mL/min — ABNORMAL LOW (ref >60)
GLUCOSE: 105 mg/dL — AB (ref 65–99)
Potassium: 4.3 mmol/L (ref 3.5–5.1)
Sodium: 144 mmol/L (ref 135–145)

## 2016-12-08 LAB — MRSA PCR SCREENING: MRSA BY PCR: NEGATIVE

## 2016-12-08 MED ORDER — AMLODIPINE BESYLATE 5 MG PO TABS: 5.0000 mg | ORAL_TABLET | Freq: Every day | ORAL | Status: DC

## 2016-12-08 MED ORDER — HEPARIN SODIUM (PORCINE) 5000 UNIT/ML IJ SOLN
5000.0000 [IU] | Freq: Three times a day (TID) | INTRAMUSCULAR | Status: DC
  Filled 2016-12-08 (×2): qty 1

## 2016-12-08 MED ORDER — ACETAMINOPHEN 325 MG PO TABS: 650.0000 mg | ORAL_TABLET | Freq: Four times a day (QID) | ORAL | Status: DC | PRN

## 2016-12-08 MED ORDER — PROMETHAZINE HCL 25 MG PO TABS: 12.5000 mg | ORAL_TABLET | Freq: Four times a day (QID) | ORAL | Status: DC | PRN

## 2016-12-08 MED ORDER — FUROSEMIDE 40 MG PO TABS
40.0000 mg | ORAL_TABLET | Freq: Every day | ORAL | Status: DC
  Administered 2016-12-09 – 2016-12-11 (×3): 40 mg via ORAL
  Filled 2016-12-08 (×3): qty 1

## 2016-12-08 MED ORDER — SODIUM CHLORIDE 0.9 % IV SOLN
INTRAVENOUS | Status: DC
  Administered 2016-12-08: 1000 mL via INTRAVENOUS

## 2016-12-08 MED ORDER — SENNOSIDES-DOCUSATE SODIUM 8.6-50 MG PO TABS
1.0000 | ORAL_TABLET | Freq: Every day | ORAL | Status: DC
  Filled 2016-12-08: qty 1

## 2016-12-08 MED ORDER — VANCOMYCIN HCL IN DEXTROSE 750-5 MG/150ML-% IV SOLN
750.0000 mg | INTRAVENOUS | Status: DC
  Administered 2016-12-08 – 2016-12-10 (×3): 750 mg via INTRAVENOUS
  Filled 2016-12-08 (×4): qty 150

## 2016-12-08 MED ORDER — PANTOPRAZOLE SODIUM 40 MG PO TBEC
40.0000 mg | DELAYED_RELEASE_TABLET | Freq: Every day | ORAL | Status: DC
  Filled 2016-12-08: qty 1

## 2016-12-08 MED ORDER — ASPIRIN EC 81 MG PO TBEC
81.0000 mg | DELAYED_RELEASE_TABLET | Freq: Every day | ORAL | Status: DC
  Administered 2016-12-09 – 2016-12-11 (×3): 81 mg via ORAL
  Filled 2016-12-08 (×3): qty 1

## 2016-12-08 MED ORDER — FINASTERIDE 5 MG PO TABS
5.0000 mg | ORAL_TABLET | Freq: Every day | ORAL | Status: DC
  Administered 2016-12-09 – 2016-12-11 (×3): 5 mg via ORAL
  Filled 2016-12-08 (×3): qty 1

## 2016-12-08 MED ORDER — ACETAMINOPHEN 650 MG RE SUPP
650.0000 mg | Freq: Four times a day (QID) | RECTAL | Status: DC | PRN
Start: 2016-12-08 — End: 2016-12-11

## 2016-12-08 MED ORDER — DEXTROSE 5 % IV SOLN
2.0000 g | INTRAVENOUS | Status: DC
  Administered 2016-12-08 – 2016-12-10 (×3): 2 g via INTRAVENOUS
  Filled 2016-12-08 (×4): qty 2

## 2016-12-08 MED ORDER — AMLODIPINE BESYLATE 5 MG PO TABS
5.0000 mg | ORAL_TABLET | Freq: Every day | ORAL | Status: DC
  Administered 2016-12-09 – 2016-12-11 (×3): 5 mg via ORAL
  Filled 2016-12-08 (×4): qty 1

## 2016-12-08 NOTE — Progress Notes (Signed)
Notified MD oncall that pt's BP is 180/87. MD stated ok. Will continue to monitor pt. Nelda MarseilleJenny Thacker, RN

## 2016-12-08 NOTE — Patient Instructions (Signed)
We will admit you for IV antibiotics for your leg wounds.

## 2016-12-08 NOTE — H&P (Signed)
   Date: 12/08/2016               Patient Name:  Vincent Miranda MRN: [id number]  DOB: 03/14/1925 Age / Sex: 83 y.o., male   PCP: Andrew Wallace, DO         Medical Service: Internal Medicine Teaching Service         Attending Physician: Dr. Emily Mullen, MD    First Contact: Dr. Adam Johnson Pager: [phone]  Second Contact: Dr. Parth Saraiya Pager: [phone]       After Hours (After 5p/  First Contact Pager: [phone]  weekends / holidays): Second Contact Pager: [phone]   Chief Complaint: Leg infection  History of Present Illness: Vincent Miranda is a 83 y.o. gentleman with PMH gout, chronic leg wounds secondary to PVD, PAF not on anticoagualtion, HTN, and CKD3 who presents from clinic with worsening of his chronic leg wound over 3-4 weeks with purulent drainage, foul odor, erythema, and new ulcerations. He was admitted in October 2017 with cellulitis/osteomyelitis, received IV antibiotics, and completed his course of Augmentin after discharge. Patient denies surgical options and wishes to be treated conservatively. Home health had previously been providing wound care for the patient but had to discontinue their visits due to noncompliance or dressing changes. His wife is his primary caretaker at home and initially was changing his dressings every 2 days with SSD cream but has to change them daily now due to them being soaked through with drainage. He remains ambulatory with no pain. Denies fevers, chills, dizziness, nausea/vomiting, fatigue, or appetite loss. He has 2+ pitting edema to knees bilaterally for which he takes Lasix 40 mg daily.  In clinic vitals 97.8, HR 92, RR 14, BP 171/91, SpO2 96% with malodorous draining ulcers over bilateral lower extremities. Blood cultures drawn and initial labs largely unremarkable. BMP with Cr 1.7 at baseline, CBC with WBC 8.8, Hb 9.6 (MCV 84), plts 275. Patient was started on broad spectrum antibiotics and admitted to IMTS.   Meds:  Current  Meds  Medication Sig  . albuterol (PROVENTIL HFA;VENTOLIN HFA) 108 (90 Base) MCG/ACT inhaler Inhale 2 puffs into the lungs every 6 (six) hours as needed for wheezing or shortness of breath.  . allopurinol (ZYLOPRIM) 100 MG tablet Take 2 tablets (200 mg total) by mouth daily.  Amlodipine 5 mg SSD 1% cream Lasix 40mg daily Finasteride 5mg daily Protonix 40mg daily  Allergies: Allergies as of 12/08/2016  . (No Known Allergies)   Past Medical History:  Diagnosis Date  . Acute blood loss anemia 10/18/2013  . Anemia of chronic disease   . Chronic kidney disease   . Gout   . History of blood transfusion   . Hypertension    Family History:  No family history on file.  Social History:  Social History   Social History  . Marital status: Married    Spouse name: N/A  . Number of children: N/A  . Years of education: N/A   Occupational History  . Not on file.   Social History Main Topics  . Smoking status: Former Smoker    Packs/day: 1.00    Years: 43.00    Types: Cigarettes  . Smokeless tobacco: Never Used     Comment: "quit smoking cigarettes in the 1980s"  . Alcohol use Yes     Comment: "nothing anymore" (02/03/2016)  . Drug use: No  . Sexual activity: Not Currently   Other Topics Concern  . Not on file     Social History Narrative  . No narrative on file    Review of Systems: A complete ROS was negative except as per HPI.   Physical Exam: Blood pressure (!) 171/91, pulse 92, temperature 97.8 F (36.6 C), temperature source Oral, SpO2 96 %.  General appearance: Thin elderly gentleman resting in chair, in no distress, soft spoken, wife answers most questions HENT: Normocephalic, atraumatic, moist mucous membranes Eyes: PERRL, EOM inact Cardiovascular: Regular rate and rhythm, no murmurs, rubs, gallops Respiratory: Mild expiratory wheezing bilaterally, normal work of breathing Abdomen: BS+, soft, non-tender, non-distended Extremities: Thin bulk and normal range  of motion, 2+ edema pitting edema to knees bilaterally, 1+ peripheral pulses, extremities warm, extensive erythematous ulcerations and deformity of both feet, ankles, and erythema and swelling extends 3/4 way to knees, appears dry, no apparent purulent discharge. Skin: Warm, dry Neuro: Alert and oriented, no sensation of feet and ankles bilaterally Psych: Blunted affect, clear speech  Photos:         Assessment & Plan by Problem:  Atherosclerotic peripheral vascular disease with gangrene (HCC), 3-4 weeks of progressive weeping worsening ulceration over BLEs, malodorous and concerning for infection similar to previous hospitalization in October 2017. History on recurrent cellulitis and osteomyelitis of these chronic wounds, not interested in surgical options at all, preferring conservative measures. No fevers, no leukocytosis, or vital abnormalities. No obvious purulent fluid to culture on exam. - Empiric vancomycin and cefepime per pharm for presumed osteomyelitis vs cellulitis - Wound care consult - Obtain plain films Tib/Fib and feet bilaterally to assess extent of bone involvement and severity/depth of infection. - Check ESR/CRP - mIVF 75 cc/hr overnight - Follow blood cultures - Trend CBC - Tylenol prn for pain  GERD - home protonix Gout - holding allopurinol tonight BLE edema - home Lasix 40mg HTN - home Amlodipine BPH - home Proscar  FEN/GI: HH/CM diet, replete electrolytes as needed  DVT ppx: Lovenox  Code status: Full code  Dispo: Admit patient to Observation with expected length of stay less than 2 midnights.  Signed: Adam Johnson, MD 12/08/2016, 5:13 PM  Pager: [phone]  

## 2016-12-08 NOTE — Assessment & Plan Note (Addendum)
Patient continues to deal with chronic leg wounds resulting from significant peripheral vascular disease. He was most recently hospitalized in October 2017 for cellulitis of these wounds. Patient denies surgical options and wishes to be treated conservatively. Home health had previously been providing wound care for the patient but had to discontinue their visits due to noncompliance or dressing changes. He has not been seen in our clinic again until he presents today.  Patient has been using furosemide 40 mg daily for lower extremity swelling and has been taking SSD cream for wound care. His wife is managing his wounds with daily dressing changes. He notes that over the last several weeks his wounds began to worsen and have had significant amounts of drainage.  On examination today of lower extremity is grossly infected with purulent drainage from multiple wounds on both feet. It appears that there is some bone of the metatarsal visible on the left foot. There is also significant amount of smell concerning for infection.  Plan: Patient is agreeable for admission today, definitive options for amputation were discussed with the patient is still not agreeable to this. We will plan IV antibiotics.

## 2016-12-08 NOTE — Addendum Note (Signed)
Addended by: Carolynn CommentSTRELOW, Najla Aughenbaugh A on: 12/08/2016 02:09 PM   Modules accepted: Orders

## 2016-12-08 NOTE — Addendum Note (Signed)
Addended by: Carolynn CommentSTRELOW, Irving Bloor A on: 12/08/2016 01:36 PM   Modules accepted: Orders

## 2016-12-08 NOTE — Progress Notes (Addendum)
CC: f/u chronic leg wounds  HPI: [name] is a 83 y.o. male with a h/o of CKDIII, HTN, chronic leg wounds, PAD who presents for evaluation of chronic leg wounds.  Please see Problem-based charting for HPI and the status of patient's chronic medical conditions.  Past Medical History:  Diagnosis Date  . Acute blood loss anemia 10/18/2013  . Anemia of chronic disease   . Chronic kidney disease   . Gout   . History of blood transfusion   . Hypertension    Social History  Substance Use Topics  . Smoking status: Former Smoker    Packs/day: 1.00    Years: 43.00    Types: Cigarettes  . Smokeless tobacco: Never Used     Comment: "quit smoking cigarettes in the 1980s"  . Alcohol use Yes     Comment: "nothing anymore" (02/03/2016)   Family Hx: No h/o vascular disease in the family.  Review of Systems: ROS in HPI. Otherwise: Review of Systems  Constitutional: Negative for chills, fever, malaise/fatigue and weight loss.  Respiratory: Positive for cough, sputum production, shortness of breath and wheezing.   Cardiovascular: Negative for chest pain and leg swelling.  Gastrointestinal: Negative for abdominal pain, constipation, diarrhea, nausea and vomiting.  Genitourinary: Negative for dysuria, frequency and urgency.  Neurological: Positive for tingling (BLE).   Physical Exam: Vitals:   12/08/16 1055  BP: (!) 160/82  Pulse: (!) 55  Temp: 98 F (36.7 C)  TempSrc: Oral  SpO2: (!) 88%  Weight: 169 lb 11.2 oz (77 kg)   Physical Exam  Constitutional: He is cooperative. No distress.  Cardiovascular: Normal rate, regular rhythm, normal heart sounds and normal pulses.  Exam reveals no gallop.   No murmur heard. Pulmonary/Chest: Effort normal. No respiratory distress. He has wheezes. He has no rhonchi. He has no rales.  Abdominal: Soft. Bowel sounds are normal. There is no tenderness.  Musculoskeletal: He exhibits edema (2+ BLE to knee).  Grossly infected wounds of  the BLE w/ purulent drainage. Sensation intact to light touch bilaterally. Decreased pain sensation.  Skin: Skin is intact.    Assessment & Plan:  See encounters tab for problem based medical decision making. Patient seen with Dr. Oswaldo Done  Atherosclerotic peripheral vascular disease with gangrene [facility]) Patient continues to deal with chronic leg wounds resulting from significant peripheral vascular disease. He was most recently hospitalized in October 2017 for cellulitis of these wounds. Patient denies surgical options and wishes to be treated conservatively. Home health had previously been providing wound care for the patient but had to discontinue their visits due to noncompliance or dressing changes. He has not been seen in our clinic again until he presents today.  Patient has been using furosemide 40 mg daily for lower extremity swelling and has been taking SSD cream for wound care. His wife is managing his wounds with daily dressing changes. He notes that over the last several weeks his wounds began to worsen and have had significant amounts of drainage.  On examination today of lower extremity is grossly infected with purulent drainage from multiple wounds on both feet. It appears that there is some bone of the metatarsal visible on the left foot. There is also significant amount of smell concerning for infection.  Plan: Patient is agreeable for admission today, definitive options for amputation were discussed with the patient is still not agreeable to this. We will plan IV antibiotics.  Wheezing Patient was noted have significant wheezing on examination today.  His oxygen saturation was also decreased at 88%. She reports some shortness of breath with exertion over the last several days and has developed a cough productive of clear sputum. He has no history of lung disease but has a remote smoking history of 20 pack years. Patient reports that until 3 days ago he was in his normal state of  respite her health. He denies any fevers, chills, chest pain.  Plan: Would recommend further workup and evaluation during admission.   Signed: Carolynn CommentBryan Loyola Santino, MD 12/08/2016, 12:16 PM  Pager: [phone]

## 2016-12-08 NOTE — Progress Notes (Signed)
Pharmacy Antibiotic Note  Vincent PigeonRobert T Havey is a 83 y.o. male admitted on 12/08/2016 with cellulitis/osteomyelitis.  Pharmacy has been consulted for Vancomycin / Cefepime dosing.  Plan: Cefepime 2 grams iv Q 24 hours Vancomycin 750 mg iv Q 24 hours     Temp (24hrs), Avg:97.9 F (36.6 C), Min:97.8 F (36.6 C), Max:98 F (36.7 C)   Recent Labs Lab 12/08/16 1420  WBC 8.8  CREATININE 1.77*    Estimated Creatinine Clearance: 30.1 mL/min (by C-G formula based on SCr of 1.77 mg/dL (H)).    No Known Allergies   Thank you for allowing pharmacy to be a part of this patient's care. Okey RegalLisa Jushua Waltman, PharmD [phone] 12/08/2016 5:13 PM

## 2016-12-08 NOTE — Assessment & Plan Note (Signed)
Patient was noted have significant wheezing on examination today. His oxygen saturation was also decreased at 88%. She reports some shortness of breath with exertion over the last several days and has developed a cough productive of clear sputum. He has no history of lung disease but has a remote smoking history of 20 pack years. Patient reports that until 3 days ago he was in his normal state of respite her health. He denies any fevers, chills, chest pain.  Plan: Would recommend further workup and evaluation during admission.

## 2016-12-08 NOTE — Addendum Note (Signed)
Addended by: Deneise LeverSARAIYA, Donia Yokum A on: 12/08/2016 01:12 PM   Modules accepted: Orders, SmartSet

## 2016-12-08 NOTE — Addendum Note (Signed)
Addended by: Carolynn CommentSTRELOW, Zamorah Ailes A on: 12/08/2016 12:43 PM   Modules accepted: Level of Service

## 2016-12-09 DIAGNOSIS — J449 Chronic obstructive pulmonary disease, unspecified: Secondary | ICD-10-CM | POA: Diagnosis present

## 2016-12-09 DIAGNOSIS — L02415 Cutaneous abscess of right lower limb: Secondary | ICD-10-CM | POA: Diagnosis present

## 2016-12-09 DIAGNOSIS — M86471 Chronic osteomyelitis with draining sinus, right ankle and foot: Secondary | ICD-10-CM

## 2016-12-09 DIAGNOSIS — L539 Erythematous condition, unspecified: Secondary | ICD-10-CM | POA: Diagnosis not present

## 2016-12-09 DIAGNOSIS — M8660 Other chronic osteomyelitis, unspecified site: Secondary | ICD-10-CM | POA: Diagnosis present

## 2016-12-09 DIAGNOSIS — L03119 Cellulitis of unspecified part of limb: Secondary | ICD-10-CM | POA: Diagnosis not present

## 2016-12-09 DIAGNOSIS — D638 Anemia in other chronic diseases classified elsewhere: Secondary | ICD-10-CM | POA: Diagnosis present

## 2016-12-09 DIAGNOSIS — M86671 Other chronic osteomyelitis, right ankle and foot: Secondary | ICD-10-CM | POA: Diagnosis present

## 2016-12-09 DIAGNOSIS — I480 Paroxysmal atrial fibrillation: Secondary | ICD-10-CM | POA: Diagnosis present

## 2016-12-09 DIAGNOSIS — Z9119 Patient's noncompliance with other medical treatment and regimen: Secondary | ICD-10-CM | POA: Diagnosis not present

## 2016-12-09 DIAGNOSIS — N400 Enlarged prostate without lower urinary tract symptoms: Secondary | ICD-10-CM | POA: Diagnosis present

## 2016-12-09 DIAGNOSIS — Z87891 Personal history of nicotine dependence: Secondary | ICD-10-CM | POA: Diagnosis not present

## 2016-12-09 DIAGNOSIS — L03115 Cellulitis of right lower limb: Secondary | ICD-10-CM | POA: Diagnosis present

## 2016-12-09 DIAGNOSIS — I70269 Atherosclerosis of native arteries of extremities with gangrene, unspecified extremity: Secondary | ICD-10-CM

## 2016-12-09 DIAGNOSIS — L02419 Cutaneous abscess of limb, unspecified: Secondary | ICD-10-CM | POA: Diagnosis not present

## 2016-12-09 DIAGNOSIS — Z79899 Other long term (current) drug therapy: Secondary | ICD-10-CM | POA: Diagnosis not present

## 2016-12-09 DIAGNOSIS — K219 Gastro-esophageal reflux disease without esophagitis: Secondary | ICD-10-CM | POA: Diagnosis present

## 2016-12-09 DIAGNOSIS — N183 Chronic kidney disease, stage 3 (moderate): Secondary | ICD-10-CM | POA: Diagnosis present

## 2016-12-09 DIAGNOSIS — I70261 Atherosclerosis of native arteries of extremities with gangrene, right leg: Secondary | ICD-10-CM | POA: Diagnosis present

## 2016-12-09 DIAGNOSIS — M109 Gout, unspecified: Secondary | ICD-10-CM | POA: Diagnosis present

## 2016-12-09 DIAGNOSIS — K5900 Constipation, unspecified: Secondary | ICD-10-CM | POA: Diagnosis present

## 2016-12-09 DIAGNOSIS — I129 Hypertensive chronic kidney disease with stage 1 through stage 4 chronic kidney disease, or unspecified chronic kidney disease: Secondary | ICD-10-CM | POA: Diagnosis present

## 2016-12-09 LAB — CBC
HCT: 29.2 % — ABNORMAL LOW (ref 39.0–52.0)
Hemoglobin: 9.1 g/dL — ABNORMAL LOW (ref 13.0–17.0)
MCH: 26.7 pg (ref 26.0–34.0)
MCHC: 31.2 g/dL (ref 30.0–36.0)
MCV: 85.6 fL (ref 78.0–100.0)
Platelets: 255 10*3/uL (ref 150–400)
RBC: 3.41 MIL/uL — AB (ref 4.22–5.81)
RDW: 17.3 % — ABNORMAL HIGH (ref 11.5–15.5)
WBC: 7.9 10*3/uL (ref 4.0–10.5)

## 2016-12-09 LAB — COMPREHENSIVE METABOLIC PANEL
ALT: 7 U/L — AB (ref 17–63)
AST: 10 U/L — ABNORMAL LOW (ref 15–41)
Albumin: 2.1 g/dL — ABNORMAL LOW (ref 3.5–5.0)
Alkaline Phosphatase: 63 U/L (ref 38–126)
Anion gap: 5 (ref 5–15)
BUN: 20 mg/dL (ref 6–20)
CHLORIDE: 112 mmol/L — AB (ref 101–111)
CO2: 26 mmol/L (ref 22–32)
CREATININE: 1.66 mg/dL — AB (ref 0.61–1.24)
Calcium: 9.6 mg/dL (ref 8.9–10.3)
GFR calc non Af Amer: 35 mL/min — ABNORMAL LOW (ref >60)
GFR, EST AFRICAN AMERICAN: 41 mL/min — AB (ref >60)
Glucose, Bld: 85 mg/dL (ref 65–99)
POTASSIUM: 3.9 mmol/L (ref 3.5–5.1)
SODIUM: 143 mmol/L (ref 135–145)
Total Bilirubin: 0.8 mg/dL (ref 0.3–1.2)
Total Protein: 7.6 g/dL (ref 6.5–8.1)

## 2016-12-09 LAB — C-REACTIVE PROTEIN: CRP: 9.6 mg/dL — AB (ref <1.0)

## 2016-12-09 LAB — SEDIMENTATION RATE: Sed Rate: 101 mm/hr — ABNORMAL HIGH (ref 0–16)

## 2016-12-09 MED ORDER — PANTOPRAZOLE SODIUM 40 MG PO TBEC: 40.0000 mg | DELAYED_RELEASE_TABLET | Freq: Every day | ORAL | Status: DC | PRN

## 2016-12-09 MED ORDER — SENNOSIDES-DOCUSATE SODIUM 8.6-50 MG PO TABS: 1.0000 | ORAL_TABLET | Freq: Every evening | ORAL | Status: DC | PRN

## 2016-12-09 MED ORDER — ALBUTEROL SULFATE (2.5 MG/3ML) 0.083% IN NEBU
2.5000 mg | INHALATION_SOLUTION | Freq: Four times a day (QID) | RESPIRATORY_TRACT | Status: DC | PRN
  Administered 2016-12-09: 2.5 mg via RESPIRATORY_TRACT
  Filled 2016-12-09: qty 3

## 2016-12-09 MED ORDER — ALLOPURINOL 100 MG PO TABS
200.0000 mg | ORAL_TABLET | Freq: Every day | ORAL | Status: DC
  Administered 2016-12-09 – 2016-12-11 (×3): 200 mg via ORAL
  Filled 2016-12-09 (×3): qty 2

## 2016-12-09 NOTE — Progress Notes (Addendum)
Subjective: [name] has no acute complaints this AM. No pain. Could not sleep well. Concerned that he is taking a blood pressure pill that he was not taking at home. Improved swelling and erythema of ulcerative infections of BLEs apparent on exam today.   Objective: Vital signs in last 24 hours: Vitals:   12/08/16 2202 12/08/16 2250 12/09/16 0654 12/09/16 0658  BP: (!) 180/87 (!) 181/90  (!) 182/94  Pulse: 86 86 98 99  Resp: 18 18 18    Temp: 98.2 F (36.8 C)  97.8 F (36.6 C)   TempSrc: Oral  Oral   SpO2: 97% 97%  98%    Intake/Output Summary (Last 24 hours) at 12/09/16 1314 Last data filed at 12/09/16 0614  Gross per 24 hour  Intake           771.25 ml  Output              350 ml  Net           421.25 ml    Physical Exam General appearance: Thin elderly gentleman resting in bed, in no distress, soft spoken HENT: Normocephalic, atraumatic, moist mucous membranes Eyes: PERRL, EOM inact Cardiovascular: Regular rate and rhythm, no murmurs, rubs, gallops Respiratory: Moderate expiratory wheezing bilaterally, normal work of breathing, poor conversational capacity Abdomen: BS+, soft, non-tender, non-distended Extremities: Thin bulk and normal range of motion, 1+ edema pitting edema to knees bilaterally, 1+ peripheral pulses, extremities warm, extensive erythematous ulcerations and deformity of both feet, ankles, improving erythema extends 1/4 way to knees, appears dry, no apparent purulent discharge. Skin: Warm, dry Neuro: Alert and oriented, no sensation of feet and ankles bilaterally Psych: Blunted affect, clear speech  Labs / Imaging / Procedures: CBC Latest Ref Rng & Units 12/09/2016 12/08/2016 12/08/2016  WBC 4.0 - 10.5 K/uL 7.9 7.3 8.8  Hemoglobin 13.0 - 17.0 g/dL 9.1(L) 9.1(L) 9.6(L)  Hematocrit 39.0 - 52.0 % 29.2(L) 29.2(L) 30.7(L)  Platelets 150 - 400 K/uL 255 244 275   BMP Latest Ref Rng & Units 12/09/2016 12/08/2016 12/08/2016  Glucose 65 - 99 mg/dL 85 100(H)  105(H)  BUN 6 - 20 mg/dL 20 22(H) 21(H)  Creatinine 0.61 - 1.24 mg/dL 1.66(H) 1.75(H) 1.77(H)  BUN/Creat Ratio 10 - 24 - - -  Sodium 135 - 145 mmol/L 143 143 144  Potassium 3.5 - 5.1 mmol/L 3.9 4.2 4.3  Chloride 101 - 111 mmol/L 112(H) 107 111  CO2 22 - 32 mmol/L 26 26 27   Calcium 8.9 - 10.3 mg/dL 9.6 9.7 10.0   Dg Tibia/fibula Left  Result Date: 12/08/2016 CLINICAL DATA:  Wound, cellulitis. EXAM: LEFT TIBIA AND FIBULA - 2 VIEW COMPARISON:  Radiographs 02/02/2016 FINDINGS: Soft tissue defect about the anteromedial lower leg has diminished in size radiographically from prior exam. An overlying dressing is in place. No associated bony destructive change to suggest osteomyelitis. Chronic periosteal reaction noted about the distal tibia and fibula. Diffuse soft tissue edema. No radiopaque foreign body. No acute fracture. Advanced degenerative change about the knee. IMPRESSION: Soft tissue ulcer about the anteromedial lower leg. No radiopaque foreign body or radiographic findings of osteomyelitis. Diffuse soft tissue edema. Electronically Signed   By: Jeb Levering M.D.   On: 12/08/2016 23:50   Dg Tibia/fibula Right  Result Date: 12/08/2016 CLINICAL DATA:  Wound, cellulitis. EXAM: RIGHT TIBIA AND FIBULA - 2 VIEW COMPARISON:  No prior lower leg exams. FINDINGS: Small soft tissue defect about the anterior medial distal lower leg. No radiopaque foreign  body. No evidence of underlying bony destructive change. Chronic vascular calcifications. There is diffuse soft tissue edema. IMPRESSION: Soft tissue ulcer about the anteromedial lower leg. No radiographic evidence of osteomyelitis. Electronically Signed   By: Jeb Levering M.D.   On: 12/08/2016 23:55   Dg Foot Complete Left  Result Date: 12/08/2016 CLINICAL DATA:  Wound, cellulitis. EXAM: LEFT FOOT - COMPLETE 3+ VIEW COMPARISON:  Radiographs 02/02/2016 FINDINGS: Post resection of being first ray and likely second digit. Soft tissue ulcer noted about  the medial operative bed about the distal second metatarsal which has chronic cortical irregularity. Cortical irregularity about the distal third metatarsal and proximal phalanx (suboptimally assessed due to hammertoe deformity) are unchanged. There is no radiopaque foreign body. Soft tissue defect about the anterior lower leg has diminished in size from prior per IMPRESSION: Chronic soft tissue ulcer and cortical irregularity about the distal second and third metatarsal heads and proximal third phalanx. No radiographic evidence of acute osteomyelitis. Electronically Signed   By: Jeb Levering M.D.   On: 12/08/2016 23:54   Dg Foot Complete Right  Result Date: 12/08/2016 CLINICAL DATA:  Wound, cellulitis. EXAM: RIGHT FOOT COMPLETE - 3+ VIEW COMPARISON:  Right foot radiographs 03/26/2015 FINDINGS: Interval fifth ray transmetatarsal amputation. Postsurgical change of the fourth toe. Soft tissue defect noted about the lateral foot with mild decreased density of the lateral fifth metatarsal. Chronic irregularity about the distal first metatarsal head, stable from prior exam. Hammertoe deformity of the digits limits assessment. There is diffuse soft tissue edema. Vascular calcifications. IMPRESSION: Post interval fifth transmetatarsal amputation. Decreased density about the lateral metatarsal at the operative bed with adjacent soft tissue defect, suspicious for recurrent osteomyelitis. Electronically Signed   By: Jeb Levering M.D.   On: 12/08/2016 23:58    Assessment/Plan:  Recurrent osteomyelitis of feet, lower extremities, with overlying cellulitis - secondary to severe untreated PVD, patient not interested in potentially curative / surgical intervention. Presenting with 3-4 weeks of progressive malodorous weeping worsening ulceration over BLEs, similar to previous hospitalization in October 2017. History on recurrent cellulitis and osteomyelitis of these chronic wounds. No fevers, no leukocytosis, or  vital abnormalities. No obvious purulent fluid to culture on exam. XRs of BLE consistent with soft tissue/cellulitis in all but right foot, where there is radiographic concern for osteomyelitis of lateral metatarsal. ESR and CRP elevated. Improving erythema and swelling - Empiric vancomycin and cefepime per pharm for presumed osteomyelitis, wound improving, can transition to po for 4-6 week course after 48-72 hr IV antibiotics - Wound care consult - follow recs, for now wet to dry daily dressing changes - Follow blood cultures - Trend CBC - Tylenol prn for pain - PT eval and treat, frail and immobile elderly gentleman  CKD 3, creatitine at baseline 1.6-1.7, receiving Vancomycin - Trend BMP  Wheezing/COPD - home albuterol PRN, avoids steroids as this precipitated delirium last hospitalization GERD - home protonix, changed to PRN Gout - homeallopurinol 200 mg QD BLE edema - home Lasix 8m HTN - home Amlodipine BPH - home Proscar Constipation - Senna QHS, changed to PRN  Dispo: Anticipated discharge in approximately 1-2 day(s).   LOS: 1 day   AAsencion Partridge MD 12/09/2016, 1:14 PM Pager: [phone]

## 2016-12-09 NOTE — Progress Notes (Signed)
  Date: 12/09/2016  Patient name: Vincent Miranda  Medical record number: [id number]  Date of birth: 1924-12-15   I have seen and evaluated Vincent Pigeonobert T Mathers and discussed their care with the Residency Team. Briefly, Vincent Miranda is an 83yo man with PMH of gout, chronic leg wounds 2/2 PVD, pAF not on AC, HTN, CKD3 who presented to clinic with worsening leg wounds.  These have been worsening and becoming more difficult to manage for about 3-4 weeks, with increased drainage, odor and erythema.  Wife is primary caretaker.  Denies fever, chills, fatigue, appetite loss.  He is adamantly opposed to surgical options.    Soc Hx : Married, former smoker  Vitals:   12/09/16 0654 12/09/16 0658  BP:  (!) 182/94  Pulse: 98 99  Resp: 18   Temp: 97.8 F (36.6 C)    Physical Exam:  Gen: Awake, alert, NAD Eyes: Wearing glasses, anicteric sclerae CV: Irreg Irreg, no murmur Pulm: Mild expiratory wheezing at baseline, no crackles, no rales Abd: Soft, NT Ext: Extensive wounds to bilateral LE, erythema extended 3/4 up calves but has now regressed.  Some mild discharge at site of left small toes.  Bandages in place.   Neuro: Limited sensation in feet and ankles bilaterally  Pertinent data WBC 7.9 Cr 1.66  Xrays of legs:  Left foot/ankle: as read by Dr. Manus GunningEhinger  IMPRESSION: Chronic soft tissue ulcer and cortical irregularity about the distal second and third metatarsal heads and proximal third phalanx. No radiographic evidence of acute osteomyelitis. IMPRESSION: Soft tissue ulcer about the anteromedial lower leg. No radiopaque foreign body or radiographic findings of osteomyelitis.  Right foot/ankle as read by Dr. Manus GunningEhinger  IMPRESSION: Post interval fifth transmetatarsal amputation. Decreased density about the lateral metatarsal at the operative bed with adjacent soft tissue defect, suspicious for recurrent osteomyelitis. IMPRESSION: Soft tissue ulcer about the anteromedial lower leg.  No radiographic evidence of osteomyelitis.   Assessment and Plan: I have seen and evaluated the patient as outlined above. I agree with the formulated Assessment and Plan as detailed in the residents' note, with the following changes:   1. Soft tissue cellulitis with possible recurrent cellulitis in the right foot - Empiric vancomycin and cefepime were started - Wound care consult - BC X 2 pending - Trend CBC (has shown no leukocytosis) - Tylenol for pain - PT - Will need long course for chronic OM, may need to discuss with ID as patient nears discharge, I am not sure we will have any cultures to guide us.   Other issues per Dr. Ian MalkinJohnsons note.    Inez CatalinaEmily B Shrihaan Porzio, MD 1/20/20181:58 PM

## 2016-12-10 LAB — GLUCOSE, CAPILLARY: GLUCOSE-CAPILLARY: 83 mg/dL (ref 65–99)

## 2016-12-10 LAB — BASIC METABOLIC PANEL
ANION GAP: 6 (ref 5–15)
BUN: 20 mg/dL (ref 6–20)
CHLORIDE: 111 mmol/L (ref 101–111)
CO2: 26 mmol/L (ref 22–32)
Calcium: 9.5 mg/dL (ref 8.9–10.3)
Creatinine, Ser: 1.67 mg/dL — ABNORMAL HIGH (ref 0.61–1.24)
GFR, EST AFRICAN AMERICAN: 40 mL/min — AB (ref >60)
GFR, EST NON AFRICAN AMERICAN: 35 mL/min — AB (ref >60)
Glucose, Bld: 92 mg/dL (ref 65–99)
POTASSIUM: 3.7 mmol/L (ref 3.5–5.1)
SODIUM: 143 mmol/L (ref 135–145)

## 2016-12-10 NOTE — Progress Notes (Signed)
   Subjective: Vincent Miranda has no acute complaints this AM. No pain. No acute events overnight Has been afebrile.   Objective: Vital signs in last 24 hours: Vitals:   12/09/16 1413 12/09/16 2048 12/09/16 2213 12/10/16 0613  BP: (!) 148/74  (!) 145/64 (!) 140/99  Pulse: 92 88 98 81  Resp: '18  18 19  '$ Temp: 97.6 F (36.4 C)  97.6 F (36.4 C) 98.6 F (37 C)  TempSrc: Oral  Oral Oral  SpO2: 99%  97% 100%    Intake/Output Summary (Last 24 hours) at 12/10/16 1153 Last data filed at 12/10/16 0400  Gross per 24 hour  Intake              520 ml  Output              400 ml  Net              120 ml    Physical Exam General appearance: Thin elderly gentleman resting in bed, in no distress, soft spoken Cardiovascular: Regular rate and rhythm, no murmurs, rubs, gallops Respiratory: no wheezing, normal work of breathing, Abdomen: BS+, soft, non-tender, non-distended Extremities: extremities warm, extensive erythematous ulcerations and deformity of both feet, ankles, improving erythema extends 1/4 way to knees, covered in bandage Skin: Warm, dry  Labs / Imaging / Procedures: CBC Latest Ref Rng & Units 12/09/2016 12/08/2016 12/08/2016  WBC 4.0 - 10.5 K/uL 7.9 7.3 8.8  Hemoglobin 13.0 - 17.0 g/dL 9.1(L) 9.1(L) 9.6(L)  Hematocrit 39.0 - 52.0 % 29.2(L) 29.2(L) 30.7(L)  Platelets 150 - 400 K/uL 255 244 275   BMP Latest Ref Rng & Units 12/10/2016 12/09/2016 12/08/2016  Glucose 65 - 99 mg/dL 92 85 100(H)  BUN 6 - 20 mg/dL 20 20 22(H)  Creatinine 0.61 - 1.24 mg/dL 1.67(H) 1.66(H) 1.75(H)  BUN/Creat Ratio 10 - 24 - - -  Sodium 135 - 145 mmol/L 143 143 143  Potassium 3.5 - 5.1 mmol/L 3.7 3.9 4.2  Chloride 101 - 111 mmol/L 111 112(H) 107  CO2 22 - 32 mmol/L '26 26 26  '$ Calcium 8.9 - 10.3 mg/dL 9.5 9.6 9.7   No results found.  Assessment/Plan:  Recurrent osteomyelitis of feet, lower extremities, with overlying cellulitis - patient has been on empiric vanc and cefepime. Blood cultures have  shown NGTD. XRs of BLE consistent with soft tissue/cellulitis in all but right foot, where there is radiographic concern for osteomyelitis of lateral metatarsal. ESR and CRP elevated. Improving erythema and swelling. Was hospitalized in oct and had completed Augmentin course. Previous wound cultures in April had grown MRSA sensitive to bactrim and doxy  -continue vanc and cefepime--> can switch to oral doxy or bactrim for prolonged course- Mayneed to involve ID. -wound care consult- wet to dry dressing changes.  - Tylenol prn for pain - PT eval and treat, frail and immobile elderly gentleman  CKD 3, creatitine at baseline 1.6-1.7, receiving Vancomycin -stable   Dispo: Anticipated discharge in approximately 1-2 day(s).   LOS: 2 days   Burgess Estelle, MD 12/10/2016, 11:53 AM Pager: [phone]

## 2016-12-10 NOTE — Progress Notes (Signed)
  Date: 12/10/2016  Patient name: Vincent PigeonRobert T Miranda  Medical record number: [id number]  Date of birth: [birth year]   This patient's plan of care was discussed with the house staff. Please see their note for complete details. I concur with their findings.   Inez CatalinaEmily B Mullen, MD 12/10/2016, 1:06 PM

## 2016-12-10 NOTE — Progress Notes (Addendum)
Pt BP 174/82. Samuella CotaSvalina, MD notified. Pt denied SOB and chest pain. RN instructed to monitor pt and notify MD of any acute changes.  Pt SpO2 90% on RA, 96 on 2L via nasal cannula, MD aware.

## 2016-12-10 NOTE — Progress Notes (Signed)
PT Cancellation Note  Patient Details Name: Hope PigeonRobert T Portal MRN: [id number] DOB: November 21, 1924   Cancelled Treatment:    Reason Eval/Treat Not Completed: Patient declined, no reason specified.  Pt refusing to work with PT.  "I am only supposed to be here a couple of days, I don't need to work with therapy".  I encouraged him to go walking because the bed makes him weak and he continued to refuse.  PT will attempt again tomorrow.  Thanks,    Rollene Rotundaebecca B. Emmaclaire Switala, PT, DPT [phone]   12/10/2016, 4:55 PM

## 2016-12-11 ENCOUNTER — Other Ambulatory Visit: Payer: Self-pay | Admitting: Internal Medicine

## 2016-12-11 ENCOUNTER — Telehealth: Payer: Self-pay

## 2016-12-11 DIAGNOSIS — M8660 Other chronic osteomyelitis, unspecified site: Secondary | ICD-10-CM

## 2016-12-11 LAB — BASIC METABOLIC PANEL
Anion gap: 5 (ref 5–15)
BUN: 14 mg/dL (ref 6–20)
CALCIUM: 9.2 mg/dL (ref 8.9–10.3)
CO2: 27 mmol/L (ref 22–32)
Chloride: 109 mmol/L (ref 101–111)
Creatinine, Ser: 1.65 mg/dL — ABNORMAL HIGH (ref 0.61–1.24)
GFR, EST AFRICAN AMERICAN: 41 mL/min — AB (ref >60)
GFR, EST NON AFRICAN AMERICAN: 35 mL/min — AB (ref >60)
Glucose, Bld: 85 mg/dL (ref 65–99)
Potassium: 3.5 mmol/L (ref 3.5–5.1)
Sodium: 141 mmol/L (ref 135–145)

## 2016-12-11 LAB — CBC
HCT: 27.9 % — ABNORMAL LOW (ref 39.0–52.0)
Hemoglobin: 8.6 g/dL — ABNORMAL LOW (ref 13.0–17.0)
MCH: 26.3 pg (ref 26.0–34.0)
MCHC: 30.8 g/dL (ref 30.0–36.0)
MCV: 85.3 fL (ref 78.0–100.0)
PLATELETS: 251 10*3/uL (ref 150–400)
RBC: 3.27 MIL/uL — ABNORMAL LOW (ref 4.22–5.81)
RDW: 16.8 % — ABNORMAL HIGH (ref 11.5–15.5)
WBC: 6.4 10*3/uL (ref 4.0–10.5)

## 2016-12-11 LAB — GLUCOSE, CAPILLARY
GLUCOSE-CAPILLARY: 102 mg/dL — AB (ref 65–99)
Glucose-Capillary: 73 mg/dL (ref 65–99)

## 2016-12-11 MED ORDER — SILVER SULFADIAZINE 1 % EX CREA
TOPICAL_CREAM | Freq: Every day | CUTANEOUS | Status: DC
  Filled 2016-12-11: qty 85

## 2016-12-11 MED ORDER — SILVER SULFADIAZINE 1 % EX CREA: TOPICAL_CREAM | CUTANEOUS | 1 refills | Status: DC

## 2016-12-11 MED ORDER — DOXYCYCLINE HYCLATE 100 MG PO TABS
100.0000 mg | ORAL_TABLET | Freq: Two times a day (BID) | ORAL | Status: DC
  Administered 2016-12-11: 100 mg via ORAL
  Filled 2016-12-11: qty 1

## 2016-12-11 MED ORDER — DOXYCYCLINE HYCLATE 100 MG PO TABS: 100.0000 mg | ORAL_TABLET | Freq: Two times a day (BID) | ORAL | 0 refills | Status: AC

## 2016-12-11 NOTE — Progress Notes (Signed)
Hope Pigeonobert T Reposa to be D/C'd home  per MD order.  Discussed with the patient and all questions fully answered.  VSS, Skin clean, dry and intact without evidence of skin break down, no evidence of skin tears noted. IV catheter discontinued intact. Site without signs and symptoms of complications. Dressing and pressure applied.  An After Visit Summary was printed and given to the patient. Patient received prescription.  D/c education completed with patient/family including follow up instructions, medication list, d/c activities limitations if indicated, with other d/c instructions as indicated by MD - patient able to verbalize understanding, all questions fully answered.   Patient instructed to return to ED, call 911, or call MD for any changes in condition.   Patient escorted via WC, and D/C home via private auto.  Melvenia Needlesreti O Abigale Dorow 12/11/2016 6:56 PM

## 2016-12-11 NOTE — Discharge Summary (Signed)
Name: Vincent Miranda MRN: [id number] DOB: 06-24-1925 83 y.o. PCP: Gwynn Burly, DO  Date of Admission: 12/08/2016  4:13 PM Date of Discharge: 12/11/2016 Attending Physician: Inez Catalina, MD  Discharge Diagnosis: 1. Presumed osteomyelitis 2. Cellulitis  Principal Problem:   Osteomyelitis of right foot (HCC) Active Problems:   Cellulitis and abscess of leg   Chronic osteomyelitis (HCC)   Discharge Medications: Allergies as of 12/11/2016   No Known Allergies     Medication List    TAKE these medications   albuterol 108 (90 Base) MCG/ACT inhaler Commonly known as:  PROVENTIL HFA;VENTOLIN HFA Inhale 2 puffs into the lungs every 6 (six) hours as needed for wheezing or shortness of breath.   allopurinol 100 MG tablet Commonly known as:  ZYLOPRIM Take 2 tablets (200 mg total) by mouth daily. What changed:  how much to take  when to take this   amLODipine 5 MG tablet Commonly known as:  NORVASC Take 1 tablet (5 mg total) by mouth daily.   aspirin EC 81 MG tablet Take 81 mg by mouth daily.   doxycycline 100 MG tablet Commonly known as:  VIBRA-TABS Take 1 tablet (100 mg total) by mouth every 12 (twelve) hours.   finasteride 5 MG tablet Commonly known as:  PROSCAR Take 1 tablet (5 mg total) by mouth daily.   furosemide 40 MG tablet Commonly known as:  LASIX Take 1 tablet (40 mg total) by mouth daily.   ibuprofen 200 MG tablet Commonly known as:  ADVIL,MOTRIN Take 200-400 mg by mouth every 6 (six) hours as needed for mild pain.   pantoprazole 40 MG tablet Commonly known as:  PROTONIX Take 1 tablet (40 mg total) by mouth daily.   silver sulfADIAZINE 1 % cream Commonly known as:  SSD apply to affected area once daily What changed:  See the new instructions.   Zinc 50 MG Tabs Take 50 mg by mouth daily.       Disposition and follow-up:   Vincent Miranda was discharged from [facility] in Stable condition.  At the hospital  follow up visit please address:  1.  Chronically infected lower leg and foot ulcers resulting in recurrent cellulitis and presume osteomyelitis - assess wounds for worsening signs of infection, adherence with daily dressing changes and topical SSD cream, adherence with 4-week course of Doxycycline 100 mg BID (ends 01/08/17)  2.  Labs / imaging needed at time of follow-up: None  3.  Pending labs/ test needing follow-up: None  Follow-up Appointments: Follow-up Information    [facility]. Go on 12/25/2016.   Why:  At 10:15 AM, please arrive 15 minutes early to check in.  Contact information: [address] [place] [address] [phone]          Hospital Course by problem list: Principal Problem:   Cellulitis and abscess of leg Active Problems:   Osteomyelitis of right foot (HCC)   Atherosclerotic peripheral vascular disease with gangrene (HCC)   Chronic osteomyelitis (HCC)   1. Presumed osteomyelitis vs cellulitis of feet and bilateral lower legs Vincent Miranda is a 83 y.o. gentleman with PMH gout, PAF not on anticoagualtion, HTN, CKD3, and chronic leg wounds secondary to severe untreated atherosclerotic PVD who presented to clinic on 1/19 with 3-4 weeks of purulent drainage, foul odor, erythema, and new ulcerations over his feet and lower legs. He has been ambulatory and the ulcer non-painful. Patient is adamantly against pursuing surgical  options such as vascular surgery or amputation and wishes to be treated conservatively. He was started on empiric Vancomycin and Cefepime. Plain films of the lower legs and feet revealed extensive soft tissue edema and ulceration and findings suspicious for recurrent osteomyelitis of lateral aspect of a metatarsal of the right foot. He received IV antibiotics for 72 hours with gradual improvement in the swelling, erythema, odor, and drainage from his leg wounds. On 1/22 he was transitioned to Doxycycline 100 mg  BID, 4 week course for presumed osteo, and discharged home with plan for wife to resume dressing changes with SSD cream as she has done previously. He is scheduled for short term follow up in the IM clinic  Discharge Vitals:   BP (!) 151/66 (BP Location: Left Arm)   Pulse 76   Temp 97.8 F (36.6 C) (Oral)   Resp 16   Wt 170 lb 13.7 oz (77.5 kg)   SpO2 98%   BMI 23.83 kg/m   Pertinent Labs, Studies, and Procedures:  CBC Latest Ref Rng & Units 12/11/2016 12/09/2016 12/08/2016  WBC 4.0 - 10.5 K/uL 6.4 7.9 7.3  Hemoglobin 13.0 - 17.0 g/dL 1.6(X8.6(L) 0.9(U9.1(L) 0.4(V9.1(L)  Hematocrit 39.0 - 52.0 % 27.9(L) 29.2(L) 29.2(L)  Platelets 150 - 400 K/uL 251 255 244   BMP Latest Ref Rng & Units 12/11/2016 12/10/2016 12/09/2016  Glucose 65 - 99 mg/dL 85 92 85  BUN 6 - 20 mg/dL 14 20 20   Creatinine 0.61 - 1.24 mg/dL 4.09(W1.65(H) 1.19(J1.67(H) 4.78(G1.66(H)  BUN/Creat Ratio 10 - 24 - - -  Sodium 135 - 145 mmol/L 141 143 143  Potassium 3.5 - 5.1 mmol/L 3.5 3.7 3.9  Chloride 101 - 111 mmol/L 109 111 112(H)  CO2 22 - 32 mmol/L 27 26 26   Calcium 8.9 - 10.3 mg/dL 9.2 9.5 9.6   Dg Tibia/fibula Left  Result Date: 12/08/2016 CLINICAL DATA:  Wound, cellulitis. EXAM: LEFT TIBIA AND FIBULA - 2 VIEW COMPARISON:  Radiographs 02/02/2016 FINDINGS: Soft tissue defect about the anteromedial lower leg has diminished in size radiographically from prior exam. An overlying dressing is in place. No associated bony destructive change to suggest osteomyelitis. Chronic periosteal reaction noted about the distal tibia and fibula. Diffuse soft tissue edema. No radiopaque foreign body. No acute fracture. Advanced degenerative change about the knee. IMPRESSION: Soft tissue ulcer about the anteromedial lower leg. No radiopaque foreign body or radiographic findings of osteomyelitis. Diffuse soft tissue edema. Electronically Signed   By: Rubye OaksMelanie  Ehinger M.D.   On: 12/08/2016 23:50   Dg Tibia/fibula Right  Result Date: 12/08/2016 CLINICAL DATA:  Wound,  cellulitis. EXAM: RIGHT TIBIA AND FIBULA - 2 VIEW COMPARISON:  No prior lower leg exams. FINDINGS: Small soft tissue defect about the anterior medial distal lower leg. No radiopaque foreign body. No evidence of underlying bony destructive change. Chronic vascular calcifications. There is diffuse soft tissue edema. IMPRESSION: Soft tissue ulcer about the anteromedial lower leg. No radiographic evidence of osteomyelitis. Electronically Signed   By: Rubye OaksMelanie  Ehinger M.D.   On: 12/08/2016 23:55   Dg Foot Complete Left  Result Date: 12/08/2016 CLINICAL DATA:  Wound, cellulitis. EXAM: LEFT FOOT - COMPLETE 3+ VIEW COMPARISON:  Radiographs 02/02/2016 FINDINGS: Post resection of being first ray and likely second digit. Soft tissue ulcer noted about the medial operative bed about the distal second metatarsal which has chronic cortical irregularity. Cortical irregularity about the distal third metatarsal and proximal phalanx (suboptimally assessed due to hammertoe deformity) are unchanged. There is no radiopaque  foreign body. Soft tissue defect about the anterior lower leg has diminished in size from prior per IMPRESSION: Chronic soft tissue ulcer and cortical irregularity about the distal second and third metatarsal heads and proximal third phalanx. No radiographic evidence of acute osteomyelitis. Electronically Signed   By: Rubye Oaks M.D.   On: 12/08/2016 23:54   Dg Foot Complete Right  Result Date: 12/08/2016 CLINICAL DATA:  Wound, cellulitis. EXAM: RIGHT FOOT COMPLETE - 3+ VIEW COMPARISON:  Right foot radiographs 03/26/2015 FINDINGS: Interval fifth ray transmetatarsal amputation. Postsurgical change of the fourth toe. Soft tissue defect noted about the lateral foot with mild decreased density of the lateral fifth metatarsal. Chronic irregularity about the distal first metatarsal head, stable from prior exam. Hammertoe deformity of the digits limits assessment. There is diffuse soft tissue edema. Vascular  calcifications. IMPRESSION: Post interval fifth transmetatarsal amputation. Decreased density about the lateral metatarsal at the operative bed with adjacent soft tissue defect, suspicious for recurrent osteomyelitis. Electronically Signed   By: Rubye Oaks M.D.   On: 12/08/2016 23:58     Discharge Instructions: Discharge Instructions    Diet - low sodium heart healthy    Complete by:  As directed    Discharge instructions    Complete by:  As directed    We have prescribed an antibiotic - Doxycycline 100 mg twice daily (with food) to take for the next four weeks. This should treat your leg infections. Please continue daily dressing changes with SSD cream.   Please return to internal medicine clinic in two weeks for a hospital follow up visit so we can assess your level of healing.   If the infection seems to be getting worse call [phone] and schedule an acute care visit as soon as possible so that we can evaluate your wound and consider changing medications. If it is the weekend or after hours, you may call [phone] and ask for the internal medicine resident on call.   Increase activity slowly    Complete by:  As directed       Signed: Althia Forts, MD 12/11/2016, 6:12 PM   Pager: [phone]

## 2016-12-11 NOTE — Progress Notes (Signed)
PT Cancellation Note  Patient Details Name: Vincent PigeonRobert T Wheatley MRN: [id number] DOB: 24-Dec-1924   Cancelled Treatment:    Reason Eval/Treat Not Completed: Patient declined, no reason specified.  Pt states "i want to wait for my wife to get here".  PT encouraged out of bed activity, pt continues to refuse.   [name] 12/11/2016, 9:25 AM

## 2016-12-11 NOTE — Progress Notes (Addendum)
Subjective: Vincent Miranda has no complaints today. He is excited to eat breakfast and has been eating and drinking well. He denies pain in his legs. His wound appear to be improving with decreased swelling/erythema, some mild drainage through his bandages persists.   Will plan on changing to oral antibiotics today for potential discharge home.   Objective: Vital signs in last 24 hours: Vitals:   12/10/16 2129 12/10/16 2133 12/11/16 0500 12/11/16 0604  BP: (!) 173/82 (!) 174/82  (!) 159/80  Pulse: 86 87  76  Resp: 20   18  Temp:    98.3 F (36.8 C)  TempSrc:      SpO2: 90% 96%  100%  Weight:   170 lb 13.7 oz (77.5 kg)     Intake/Output Summary (Last 24 hours) at 12/11/16 1025 Last data filed at 12/11/16 0622  Gross per 24 hour  Intake              200 ml  Output              400 ml  Net             -200 ml    Physical Exam General appearance: Thin elderly gentleman resting in bed, in no distress, soft spoken HENT: Normocephalic, atraumatic, moist mucous membranes Eyes: PERRL, EOM inact Cardiovascular: Regular rate and rhythm, no murmurs, rubs, gallops Respiratory: Moderate expiratory wheezing bilaterally, normal work of breathing, poor conversational capacity Abdomen: BS+, soft, non-tender, non-distended Extremities: Thin bulk and normal range of motion, 1+ edema pitting edema to knees bilaterally, 1+ peripheral pulses, extremities warm, extensive erythematous ulcerations and deformity of both feet, ankles, improving erythema extends 1/4 way to knees, appears dry, no apparent purulent discharge. Skin: Warm, dry Neuro: Alert and oriented, no sensation of feet and ankles bilaterally Psych: Blunted affect, clear speech  Labs / Imaging / Procedures: CBC Latest Ref Rng & Units 12/11/2016 12/09/2016 12/08/2016  WBC 4.0 - 10.5 K/uL 6.4 7.9 7.3  Hemoglobin 13.0 - 17.0 g/dL 8.6(L) 9.1(L) 9.1(L)  Hematocrit 39.0 - 52.0 % 27.9(L) 29.2(L) 29.2(L)  Platelets 150 - 400 K/uL 251 255 244    BMP Latest Ref Rng & Units 12/11/2016 12/10/2016 12/09/2016  Glucose 65 - 99 mg/dL 85 92 85  BUN 6 - 20 mg/dL 14 20 20   Creatinine 0.61 - 1.24 mg/dL 1.65(H) 1.67(H) 1.66(H)  BUN/Creat Ratio 10 - 24 - - -  Sodium 135 - 145 mmol/L 141 143 143  Potassium 3.5 - 5.1 mmol/L 3.5 3.7 3.9  Chloride 101 - 111 mmol/L 109 111 112(H)  CO2 22 - 32 mmol/L 27 26 26   Calcium 8.9 - 10.3 mg/dL 9.2 9.5 9.6   No results found.  Assessment/Plan:  Recurrent osteomyelitis of feet, lower extremities, with overlying cellulitis - secondary to severe untreated PVD, patient not interested in potentially curative / surgical intervention. Presenting with 3-4 weeks of progressive malodorous weeping worsening ulceration over BLEs, similar to previous hospitalization in October 2017. History on recurrent cellulitis and osteomyelitis of these chronic wounds. Wound culture for 02/2016 grew pseudomonas and MRSA. No fevers, no leukocytosis, or vital abnormalities. No obvious purulent fluid to culture on exam. XRs of BLE consistent with soft tissue/cellulitis in all but right foot, where there is radiographic concern for osteomyelitis of lateral metatarsal. ESR and CRP elevated. Improving erythema and swelling - Empiric vancomycin and cefepime per pharm for presumed osteomyelitis, wound improving - discussed with ID will de-escalate to Doxycycline 100 mg BID for 4  weeks with outpatient follow up - Wound care consult - follow recs, for now wet to dry daily dressing changes - Follow blood cultures - Trend CBC - Tylenol prn for pain - PT eval and treat, frail and immobile elderly gentleman  CKD 3, creatitine at baseline 1.6-1.7 - Trend BMP  Wheezing/COPD - home albuterol PRN, avoids steroids as this precipitated delirium last hospitalization GERD - home protonix, changed to PRN Gout - homeallopurinol 200 mg QD BLE edema - home Lasix 46m HTN - home Amlodipine BPH - home Proscar Constipation - Senna QHS, changed to  PRN  Dispo: Anticipated discharge in approximately 0-1 day(s).   LOS: 3 days   AAsencion Partridge MD 12/11/2016, 10:25 AM Pager: [phone]

## 2016-12-11 NOTE — Consult Note (Addendum)
WOC Nurse wound consult note Reason for Consult: [facility]; refer to last admission 09/08/16. He has chronic full thickness wounds and has been evaluated by the ortho service in the past who recommended amputation.  His wife is at the bedside to assess wound appearance and discuss plan of care,  They are not interested in pursuing surgical interventions.  Wife has been performing topical treatment at home with Silvadene and wounds were actually improving before patient developed cellulitis and drainage this week, she states.    Pt has suspected osteomyelitis on the x-ray; this is [facility].  Discussed plan of care with primary team and recommended ID or ortho consult to treat that complex medical condition.  Wound type: Multiple full thickness wounds to BLE, all are 80% red, 20% yellow, minimal amt yellow drainage, with pink dry scar tissue surrounding, no odor at this time unless noted otherwise: Measurement: Right anterior foot 2.5X1X.2cm near toes Right inner foot 10X7X.3cm Right outer foot 4X2.5X.2cm and 5X3X.2cm Right plantar foot 1X1cm, 100% black eschar Right plantar foot .5X.5cm, 100% yellow slough Left upper foot 3X3X.2cm Right outer foot 12X4.5X.3cm Right inner ankle 2X.3X.1cm Right inner ankle .2X.2X.1cm Dressing procedure/placement/frequency: Copntinue present plan of care with Silvadene daily to provide enzymatic debridement of nonviable tissue and promote healing.  Wife is well-informed on topical treatment and can resume dressing changes after discharge.  She denies further questions. Please re-consult if further assistance is needed.  Thank-you,  Cammie Mcgeeawn Shahira Fiske MSN, RN, CWOCN, [facility], CNS [phone]

## 2016-12-11 NOTE — Telephone Encounter (Signed)
Hospital TOC. 

## 2016-12-11 NOTE — Progress Notes (Signed)
Internal Medicine Clinic Attending  I saw and evaluated the patient.  I personally confirmed the key portions of the history and exam documented by Dr. Marinda ElkStrelow and I reviewed pertinent patient test results.  The assessment, diagnosis, and plan were formulated together and I agree with the documentation in the resident's note.  Chronic LE wounds due to ischemic PAD with severe bilateral chronic OM of the feet. He has declined surgical amputation on multiple occasions, so we have been palliating him with intermittent antibiotics to try to maximize his comfort and time at home. Five admissions in 2017 for these wounds and today they again have purulent drainage and foul odor. Plan for admission for IV antibiotics. I explained to him again that this is not curative, and the only definitive way to cure these infections is with bilateral amputations, which he does not want to consider.

## 2016-12-11 NOTE — Progress Notes (Signed)
  Date: 12/11/2016  Patient name: Vincent Miranda  Medical record number: [id number]  Date of birth: [birth year]   I have seen and evaluated this patient and I have discussed the plan of care with the house staff. Please see Dr. Henriette CombsJohnson's note for complete details. I concur with his findings with the following additions/corrections:   Plan for discharge today with home health RN if family is amenable for dressing changes.   Inez CatalinaEmily B Mullen, MD 12/11/2016, 3:45 PM

## 2016-12-13 LAB — CULTURE, BLOOD (SINGLE)
CULTURE: NO GROWTH
Culture: NO GROWTH

## 2016-12-15 NOTE — Telephone Encounter (Signed)
Transition Care Management Follow-up Telephone Call   Date discharged? 12/11/16.   How have you been since you were released from the hospital? No.   Do you understand why you were in the hospital? Yes,for antibiotics.   Do you understand the discharge instructions? Yes.   Where were you discharged to? Home.   Items Reviewed:  Medications reviewed: Yes  Allergies reviewed: "I'm not allergic to anything"  Dietary changes reviewed: Yes.  Referrals reviewed: No (referrals)   Functional Questionnaire:   Activities of Daily Living (ADLs):   He states they are independent in the following: Wife helps States they require assistance with the following: my wife.   Any transportation issues/concerns?: no  Any patient concerns? None   Confirmed importance and date/time of follow-up visits scheduled yes - Feb 5  [facility]  Confirmed with patient if condition begins to worsen call PCP or go to the ER.  Patient was given the office number and encouraged to call back with question or concerns.  : Yes

## 2016-12-22 ENCOUNTER — Telehealth: Payer: Self-pay | Admitting: Internal Medicine

## 2016-12-22 NOTE — Telephone Encounter (Signed)
APT. REMINDER CALL, NO ANSWER, NO VOICEMAIL °

## 2016-12-25 ENCOUNTER — Ambulatory Visit (INDEPENDENT_AMBULATORY_CARE_PROVIDER_SITE_OTHER): Payer: Medicare Other | Admitting: Pulmonary Disease

## 2016-12-25 DIAGNOSIS — I70269 Atherosclerosis of native arteries of extremities with gangrene, unspecified extremity: Secondary | ICD-10-CM

## 2016-12-25 DIAGNOSIS — L97829 Non-pressure chronic ulcer of other part of left lower leg with unspecified severity: Secondary | ICD-10-CM

## 2016-12-25 DIAGNOSIS — I70263 Atherosclerosis of native arteries of extremities with gangrene, bilateral legs: Secondary | ICD-10-CM | POA: Diagnosis not present

## 2016-12-25 DIAGNOSIS — L97819 Non-pressure chronic ulcer of other part of right lower leg with unspecified severity: Secondary | ICD-10-CM

## 2016-12-25 DIAGNOSIS — Z7982 Long term (current) use of aspirin: Secondary | ICD-10-CM

## 2016-12-25 DIAGNOSIS — Z87891 Personal history of nicotine dependence: Secondary | ICD-10-CM

## 2016-12-25 NOTE — Assessment & Plan Note (Signed)
Hospitalized 1/19 to 1/22 for lower leg and foot ulcers with recurrent cellulitis and presumed osteomyelitis. He is on a 4 week course of doxycycine to end 2/19. His wife has been doing his wound care at home. He has expressed desire to avoid further surgery and/or amputations.  Assessment: Wounds appear stable without worsening of infection  Plan: Continue doxycycline twice a day Follow up in 3 weeks. Would like for patient to be seen sooner but wife insistent that they cannot come in often due to her work schedule and she wants to just let us know if there is a worsening. She agreed to 3 weeks. Discussed with her it will be an ongoing process to see if we can stretch out follow up. Continue home wound care. If we need to start seeing him sooner or more often will need to work on his transportation so he can come to clinic without his wife having to take off from work.  Plan

## 2016-12-25 NOTE — Progress Notes (Signed)
   CC: LE wound check  HPI:  [name] is a 83 y.o. with history as outlined below presenting for follow up of bilateral lower extremity wounds with recent cellulitis.  He was recently hospitalized 1/19 to 1/22 for lower leg and foot ulcers with recurrent cellulitis and presumed osteomyelitis. He is on a 4 week course of doxycycine to end 2/19. His wife has been doing his wound care at home. She feels that he is doing a lot better and his wounds look better. Some serosanguinous drainage.   Past Medical History:  Diagnosis Date  . Acute blood loss anemia 10/18/2013  . Anemia of chronic disease   . Chronic kidney disease   . Gout   . History of blood transfusion   . Hypertension     Review of Systems:   Denies fevers or chills Denies nausea/vomiting  Physical Exam:  Vitals:   12/25/16 1012  BP: (!) 159/69  Pulse: 81  Temp: 97.9 F (36.6 C)  TempSrc: Oral  SpO2: 98%  Weight: 158 lb 9.6 oz (71.9 kg)   General Apperance: NAD HEENT: Normocephalic, atraumatic, anicteric sclera Neck: Supple, trachea midline Lungs: Clear to auscultation bilaterally.  Heart: Regular rate and rhythm,  Abdomen: Soft, nontender, nondistended, no rebound/guarding Extremities: Warm, 2+ edema BLE Skin: see photos below, multiple areas of ulceration without surrounding warmth and no purulent drainage Neurologic: Alert and interactive. No gross deficits.           Assessment & Plan:   See Encounters Tab for problem based charting.  Patient discussed with Dr. Rogelia Miranda

## 2016-12-25 NOTE — Patient Instructions (Signed)
Continue your wound care as previously instructed Continue your antibiotics until you complete your bottle Follow up in 3 weeks

## 2016-12-26 NOTE — Progress Notes (Signed)
Internal Medicine Clinic Attending  Case discussed with Dr. Krall soon after the resident saw the patient.  We reviewed the resident's history and exam and pertinent patient test results.  I agree with the assessment, diagnosis, and plan of care documented in the resident's note. 

## 2017-02-07 ENCOUNTER — Other Ambulatory Visit: Payer: Self-pay | Admitting: Internal Medicine

## 2017-02-19 ENCOUNTER — Telehealth: Payer: Self-pay

## 2017-02-19 NOTE — Telephone Encounter (Signed)
Pt's wife states she was told if it looks like infection was returning to pt's feet to call and get rx for oral antibiotic.He had IV ABX in the hospital and place on PO ABX afterwards which he finished Feb 17th. States pt's feet were healing "nicely", now starting to have open wounds and more oozing. Has been on Doxycycline. Uses [facility] pharmacy.

## 2017-02-19 NOTE — Telephone Encounter (Signed)
Needs to speak with a nurse about med. Please call pt back.  

## 2017-02-19 NOTE — Telephone Encounter (Signed)
Given the complexity of his wounds, I would prefer him to have an [facility] appointment to assess how things are.  He was seen on 12/25/2016 after hospital d/c and was to follow up in 3 weeks but I am not seeing that this was done.  I think it would be good for him to have an [facility] appointment as soon as possible.  Thanks.

## 2017-02-20 NOTE — Telephone Encounter (Signed)
Wife informed of Dr Philis Pique response. Wife states she was off yesterday and will not have another day off until next week; unable to bring pt this week. She's becoming somewhat upset. States the agreement was to start oral ABX as soon as possible to prevent another hospitalization.

## 2017-02-20 NOTE — Telephone Encounter (Signed)
Called pt's wife - made aware of Doxycycline 100 mg BID 10 day supply. Stated she will call back to schedule pt's appt b/c unsure which day she will be off;they are involved in a large project at work.Informed to call back as soon as possible for available slots-stated she will.  Doxycycline rx called to [facility] pharmacy on [address].

## 2017-02-20 NOTE — Telephone Encounter (Signed)
Could you please call in doxycycline  BID, #20 tablets, no refills. This is a 10 day supply to get him bridged to appointment next week when he can be assessed. She also needs to agree to bring him in next week as soon as she is able and to bring him in sooner if things get any worse.  Thanks.

## 2017-03-08 ENCOUNTER — Ambulatory Visit (INDEPENDENT_AMBULATORY_CARE_PROVIDER_SITE_OTHER): Payer: Medicare Other | Admitting: Internal Medicine

## 2017-03-08 ENCOUNTER — Encounter: Payer: Self-pay | Admitting: Internal Medicine

## 2017-03-08 ENCOUNTER — Encounter (INDEPENDENT_AMBULATORY_CARE_PROVIDER_SITE_OTHER): Payer: Self-pay

## 2017-03-08 VITALS — BP 155/65 | HR 89 | Temp 98.3°F | Ht 71.0 in | Wt 166.1 lb

## 2017-03-08 DIAGNOSIS — I10 Essential (primary) hypertension: Secondary | ICD-10-CM | POA: Diagnosis present

## 2017-03-08 DIAGNOSIS — M8660 Other chronic osteomyelitis, unspecified site: Secondary | ICD-10-CM | POA: Diagnosis not present

## 2017-03-08 DIAGNOSIS — L97829 Non-pressure chronic ulcer of other part of left lower leg with unspecified severity: Secondary | ICD-10-CM | POA: Diagnosis not present

## 2017-03-08 DIAGNOSIS — Z79899 Other long term (current) drug therapy: Secondary | ICD-10-CM

## 2017-03-08 DIAGNOSIS — I70263 Atherosclerosis of native arteries of extremities with gangrene, bilateral legs: Secondary | ICD-10-CM

## 2017-03-08 DIAGNOSIS — Z87891 Personal history of nicotine dependence: Secondary | ICD-10-CM | POA: Diagnosis not present

## 2017-03-08 DIAGNOSIS — L97819 Non-pressure chronic ulcer of other part of right lower leg with unspecified severity: Secondary | ICD-10-CM | POA: Diagnosis not present

## 2017-03-08 DIAGNOSIS — I70269 Atherosclerosis of native arteries of extremities with gangrene, unspecified extremity: Secondary | ICD-10-CM

## 2017-03-08 DIAGNOSIS — R339 Retention of urine, unspecified: Secondary | ICD-10-CM

## 2017-03-08 DIAGNOSIS — Z7982 Long term (current) use of aspirin: Secondary | ICD-10-CM | POA: Diagnosis not present

## 2017-03-08 DIAGNOSIS — I480 Paroxysmal atrial fibrillation: Secondary | ICD-10-CM

## 2017-03-08 NOTE — Assessment & Plan Note (Signed)
Assessment: Rate is regular today, denies chest pain or palpitations.  His wife has declined for him to be on an anticoagulant nor does she want him on a rate controlling medication .  He is taking a ASA  daily which he has done for many years.  This patients CHA2DS2-VASc Score and unadjusted Ischemic Stroke Rate (% per year) is equal to 4.8 % stroke rate/year from a score of 4.  Plan: - continue ASA  daily.

## 2017-03-08 NOTE — Progress Notes (Signed)
CC: here for follow up of HTN and chronic wounds 2/2 PVD  HPI:  Vincent Miranda is a 83 y.o. man with a past medical history listed below here today for follow up of his HTN and PVD.   For details of today's visit and the status of his chronic medical issues please refer to the assessment and plan.   Past Medical History:  Diagnosis Date  . Acute blood loss anemia 10/18/2013  . Anemia of chronic disease   . Atrial fibrillation (HCC)   . Chronic kidney disease   . Chronic osteomyelitis (HCC)   . Gout   . History of blood transfusion   . Hypertension   . PVD (peripheral vascular disease) (HCC)     Review of Systems:  Please see pertinent ROS reviewed in HPI and problem based charting.   Physical Exam:  Vitals:   03/08/17 1422  BP: (!) 155/65  Pulse: 89  Temp: 98.3 F (36.8 C)  TempSrc: Oral  SpO2: 97%  Weight: 166 lb 1.6 oz (75.3 kg)  Height:  (1.803 m)   Physical Exam  Constitutional: He is oriented to person, place, and time.  Elderly appearing man, not in distress, sitting up in chair.  HENT:  Head: Normocephalic and atraumatic.  Cardiovascular: Normal rate and regular rhythm.   Pulmonary/Chest: Effort normal.  Musculoskeletal: He exhibits edema. He exhibits no tenderness.  1-2 + bilateral lower extremity edema extending just below the knee.  Appearance is of chronic nature with brawny, woody appearance.  Neurological: He is alert and oriented to person, place, and time.  Skin:  Multiple areas of ulceration without surrounding warmth and no purulent drainage at this time. Right plantar 4th metatarsal area with new ulceration at site of old callous.  Non-draining and no surrounding erythema.  Bone not obviously visible.  Psychiatric: He has a normal mood and affect.     Assessment & Plan:   See Encounters Tab for problem based charting.  Patient seen with Dr. Josem Kaufmann.  Essential hypertension BP Readings from Last 3 Encounters:  03/08/17 (!)  155/65  12/25/16 (!) 159/69  12/11/16 (!) 151/66   Assessment: HTN, uncontrolled.  His BP is moderately elevated today as has been the case on previous outpatient visits.  He is prescribed amlodipine  daily.  However, his wife reports he is not taking this medication and states she checks his BP at home and it is decently controlled.  She is not interested in having him on anti-hypertensive treatment.  Consideration given to increasing his Lasix to BID dosing for HTN control, however, it is felt that the risk of this does not presently outweigh the benefit.  Plan: - will discontinue amlodipine as he has not taken in at least 30 days and continue to monitor his BP at follow up visits with ongoing discussion about the risk/benefit of treatment - encouraged low-salt diet  Atherosclerotic peripheral vascular disease with gangrene (HCC) Assessment: Wounds appear stable at this time without evidence of worsening infection.  He denies fevers or chills.  Recently completed a course of doxycycline for 10 days.  His wife thinks that this improved things from a drainage standpoint.  She reports the wounds are not currently foul smelling.  On exam, he is comfortable appearing and certainly does not look systemically ill.  There is a new ulcer about the plantar aspect of the right 4th metatarsal head that she reports is new and was formerly the site of a callus.  There  is no current drainage or surrounding erythema to this site and bone does not appear visible.    Plan: - referral placed to podiatry today to address off-loading footwear with contact casting to help with right 4th metatarsal ulcer - discussed antibiotics today and agreed to hold off on continuing another course at this time.  However, have agreed that if drainage becomes more copious, purulent, or foul smelling that we could address this by calling in a prescription until patient could be seen.  Ultimately, we are treating his wounds  palliatively at this time due to not wanting to proceed with amputation and long-term suppressive antibiotic therapy may be indicated.  Patient and wife agreeable to this.  Also discussed that if he needs to be seen sooner than my schedule allows, that [facility] is an option too.  Chronic osteomyelitis (HCC) Assessment: Treated with a 4-week course of doxycycline in Jan-Feb 2018 for presumed chronic osteo in the setting of chronic lower extremity wounds and untreated PVD.  Recently also completed a 10-day course of doxycycline that seems to have quelled any recurrent infection for the time being.  Plan: - discussed with wife and patient and have agreed to hold off on continuing course of antibiotics at this time. - as things progress, long term suppressive treatment may indicated.  Urinary retention Assessment: Stable problem, well-controlled on current therapy.  Plan: - continue finasteride  Paroxysmal atrial fibrillation (HCC) Assessment: Rate is regular today, denies chest pain or palpitations.  His wife has declined for him to be on an anticoagulant nor does she want him on a rate controlling medication .  He is taking a ASA  daily which he has done for many years.  This patients CHA2DS2-VASc Score and unadjusted Ischemic Stroke Rate (% per year) is equal to 4.8 % stroke rate/year from a score of 4.  Plan: - continue ASA  daily.

## 2017-03-08 NOTE — Assessment & Plan Note (Signed)
Assessment: Stable problem, well-controlled on current therapy.  Plan: - continue finasteride

## 2017-03-08 NOTE — Assessment & Plan Note (Signed)
BP Readings from Last 3 Encounters:  03/08/17 (!) 155/65  12/25/16 (!) 159/69  12/11/16 (!) 151/66   Assessment: HTN, uncontrolled.  His BP is moderately elevated today as has been the case on previous outpatient visits.  He is prescribed amlodipine  daily.  However, his wife reports he is not taking this medication and states she checks his BP at home and it is decently controlled.  She is not interested in having him on anti-hypertensive treatment.  Consideration given to increasing his Lasix to BID dosing for HTN control, however, it is felt that the risk of this does not presently outweigh the benefit.  Plan: - will discontinue amlodipine as he has not taken in at least 30 days and continue to monitor his BP at follow up visits with ongoing discussion about the risk/benefit of treatment - encouraged low-salt diet

## 2017-03-08 NOTE — Assessment & Plan Note (Addendum)
Assessment: Wounds appear stable at this time without evidence of worsening infection.  He denies fevers or chills.  Recently completed a course of doxycycline for 10 days.  His wife thinks that this improved things from a drainage standpoint.  She reports the wounds are not currently foul smelling.  On exam, he is comfortable appearing and certainly does not look systemically ill.  There is a new ulcer about the plantar aspect of the right 4th metatarsal head that she reports is new and was formerly the site of a callus.  There is no current drainage or surrounding erythema to this site and bone does not appear visible.    Plan: - referral placed to podiatry today to address off-loading footwear with contact casting to help with right 4th metatarsal ulcer - discussed antibiotics today and agreed to hold off on continuing another course at this time.  However, have agreed that if drainage becomes more copious, purulent, or foul smelling that we could address this by calling in a prescription until patient could be seen.  Ultimately, we are treating his wounds palliatively at this time due to not wanting to proceed with amputation and long-term suppressive antibiotic therapy may be indicated.  Patient and wife agreeable to this.  Also discussed that if he needs to be seen sooner than my schedule allows, that [facility] is an option too.

## 2017-03-08 NOTE — Assessment & Plan Note (Signed)
Assessment: Treated with a 4-week course of doxycycline in Jan-Feb 2018 for presumed chronic osteo in the setting of chronic lower extremity wounds and untreated PVD.  Recently also completed a 10-day course of doxycycline that seems to have quelled any recurrent infection for the time being.  Plan: - discussed with wife and patient and have agreed to hold off on continuing course of antibiotics at this time. - as things progress, long term suppressive treatment may indicated.

## 2017-03-08 NOTE — Patient Instructions (Signed)
Thank you for coming to see me today. It was a pleasure. Today we talked about:   Foot wounds look stable and you're doing a good job taking care of them.  I have ordered a referral to podiatry for you to help with special foot wear and to unload pressure on the right wound.  Please follow-up with me in 3 months or sooner if needed.  If you have any questions or concerns, please do not hesitate to call the office at [phone].  Take Care,   Gwynn Burly, DO

## 2017-03-12 NOTE — Progress Notes (Signed)
I saw and evaluated the patient.  I personally confirmed the key portions of Dr. Wallace's history and exam and reviewed pertinent patient test results.  The assessment, diagnosis, and plan were formulated together and I agree with the documentation in the resident's note. 

## 2017-03-13 ENCOUNTER — Other Ambulatory Visit: Payer: Self-pay | Admitting: Internal Medicine

## 2017-03-14 NOTE — Telephone Encounter (Signed)
I am not certain of this cream is still indicated. Will fill but no RF and send to PCP for review

## 2017-03-19 IMAGING — CR DG CHEST 2V
3 series · 3 of 3 positions shown · non-contrast
Comparison: 02/24/2016

CLINICAL DATA: Rash

EXAM:
CHEST  2 VIEW

[chest ap (1 of 2)]
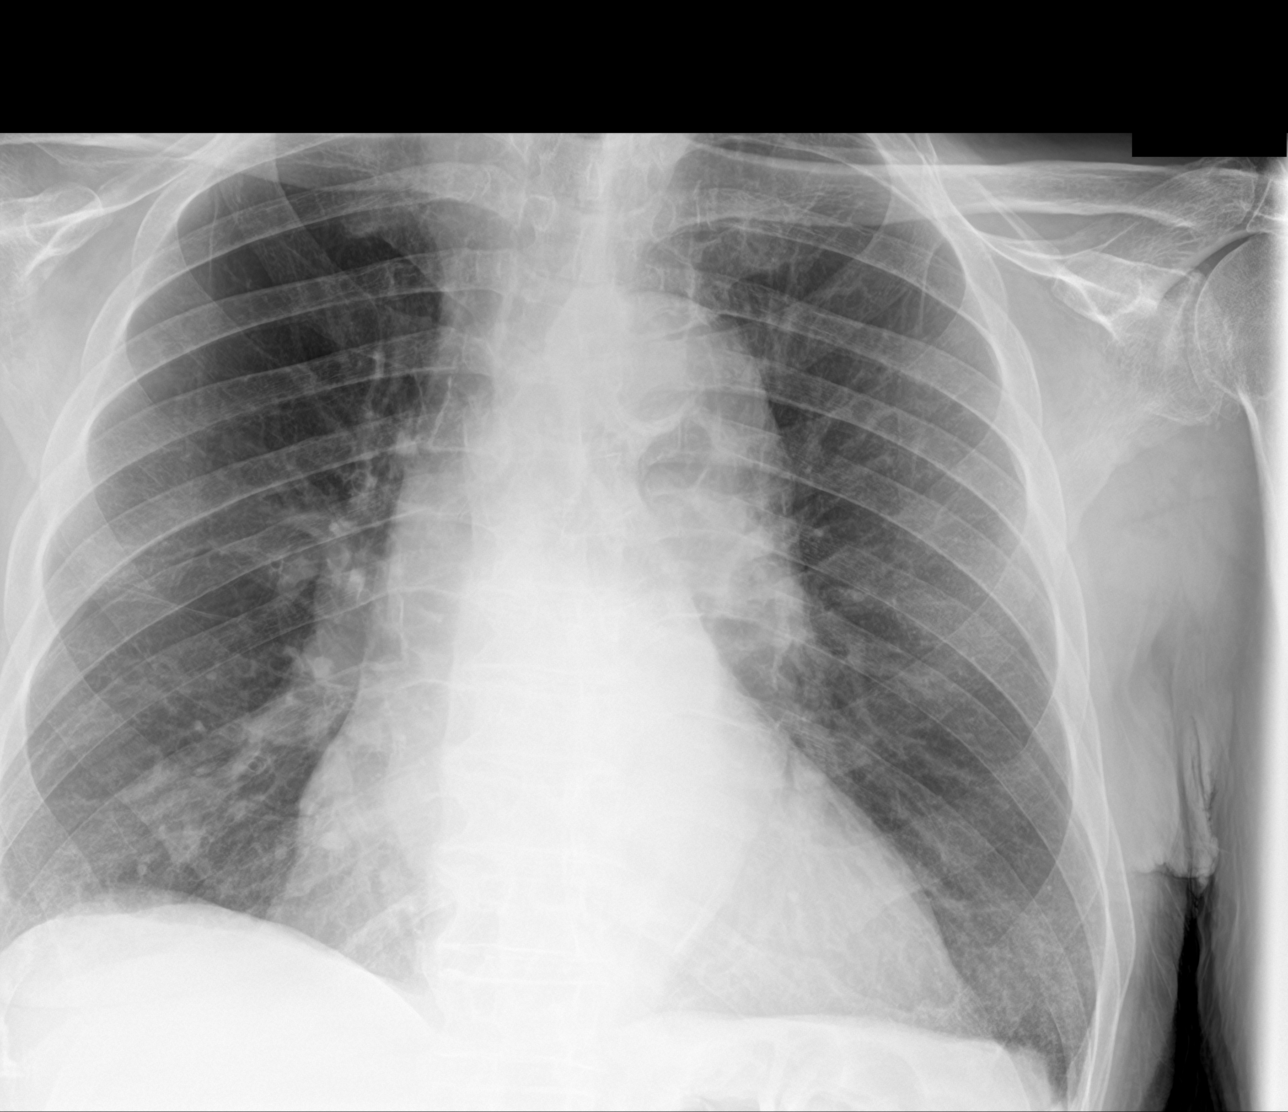

[chest lat]
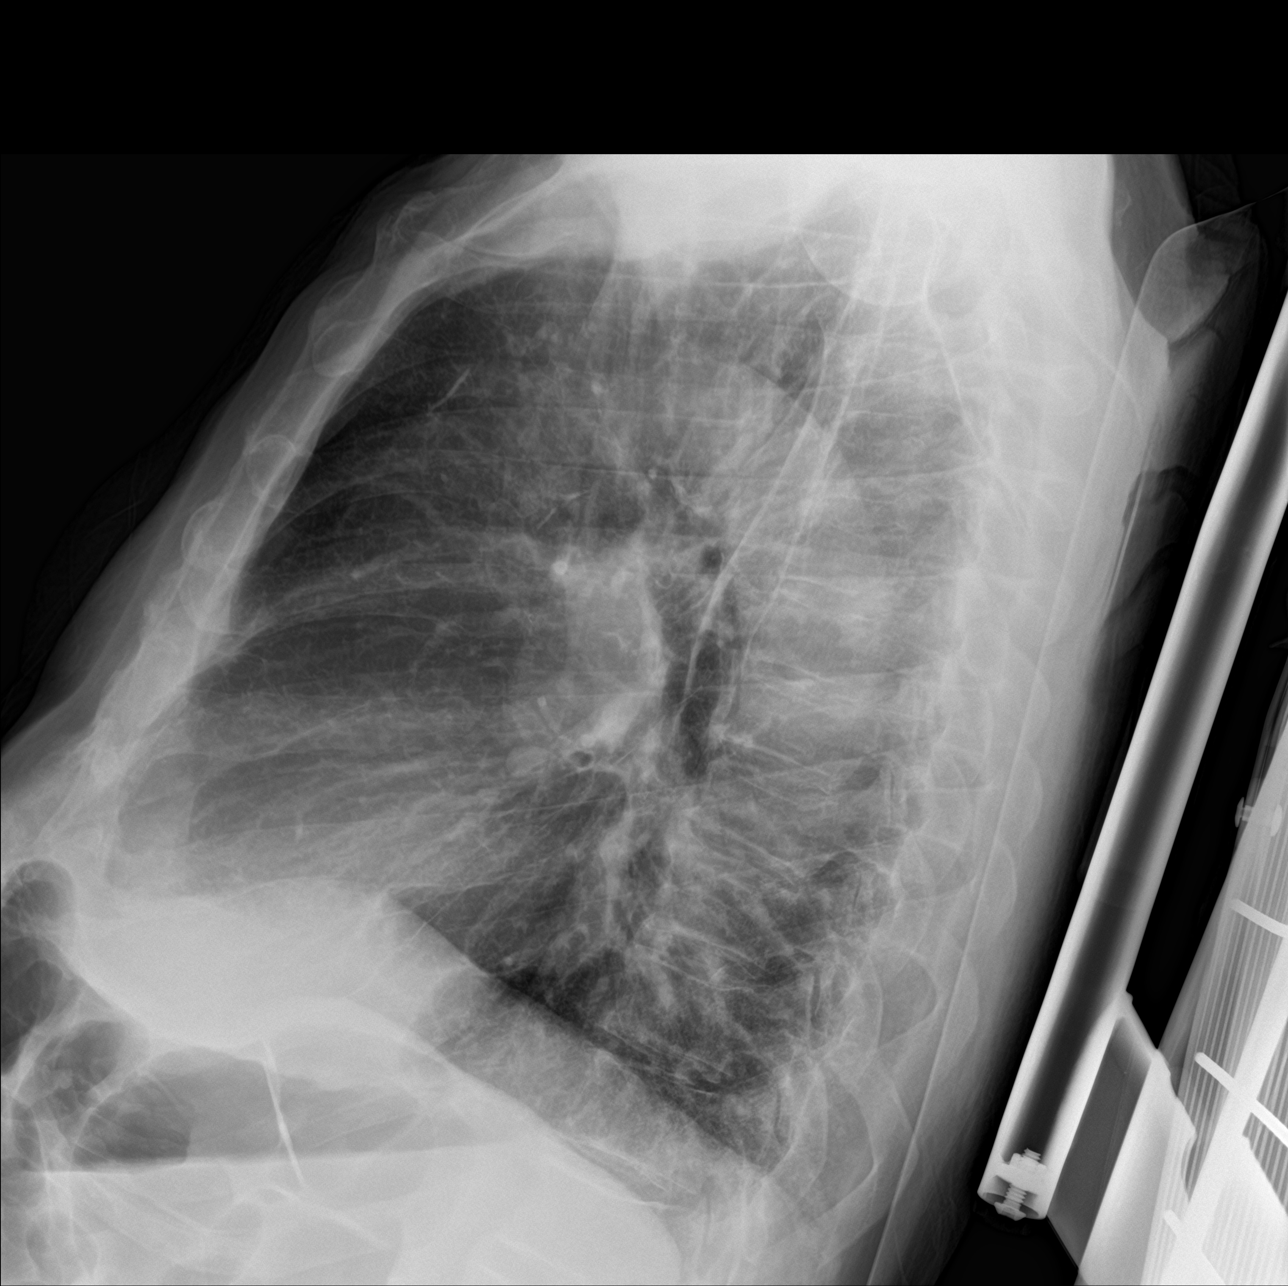

[chest ap (2 of 2)]
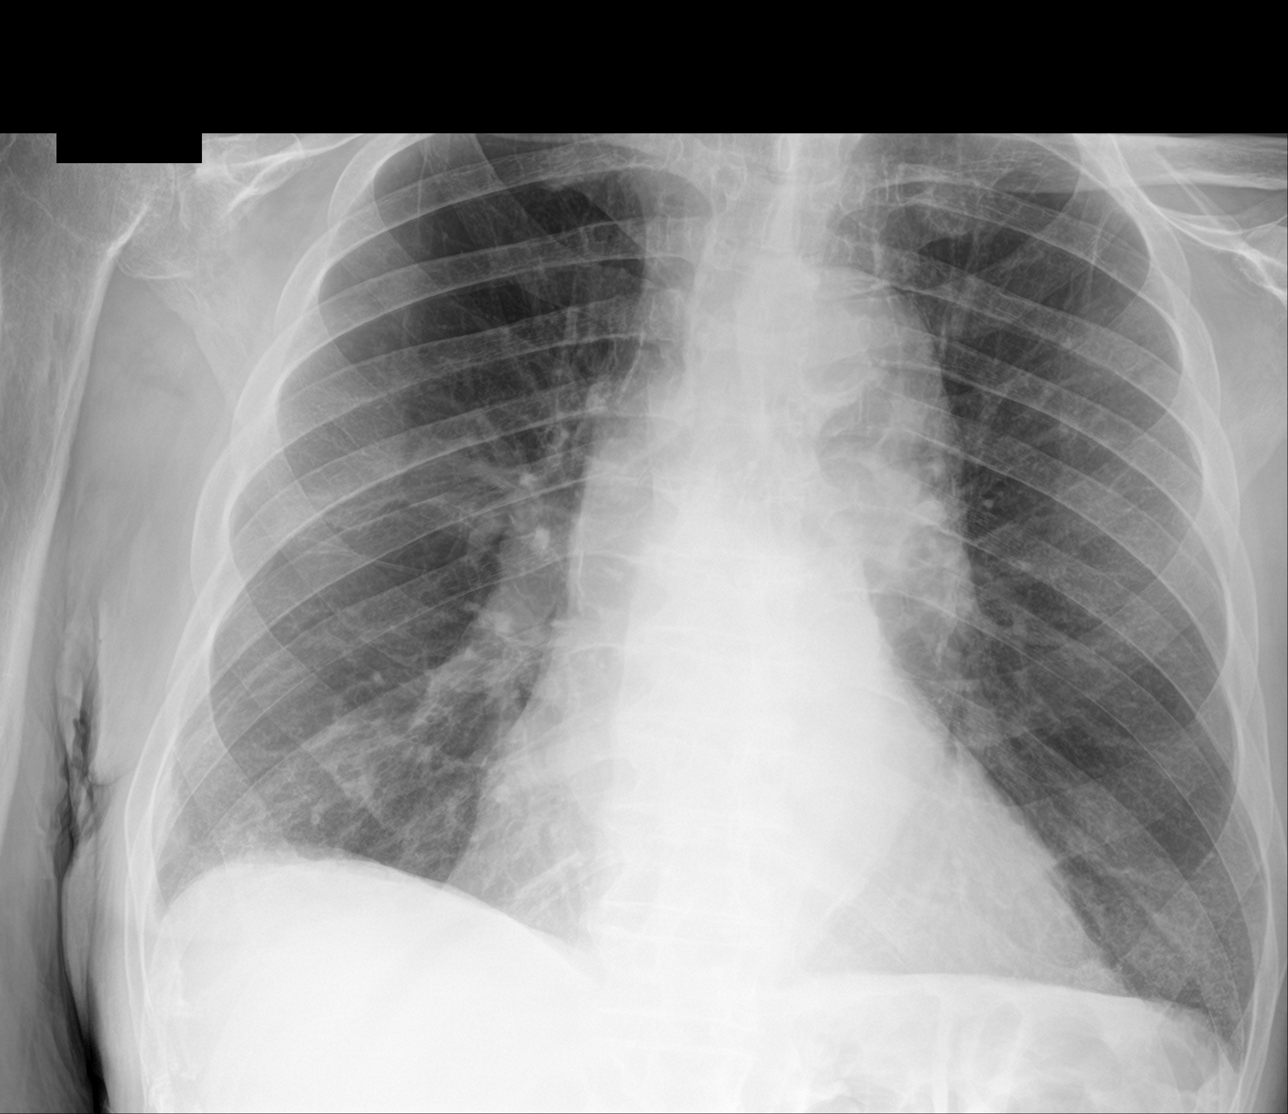

[3 of 3 positions shown; findings below may reference images not displayed]

FINDINGS: Chronic cardiomegaly and aortic tortuosity. Mild reticular markings
at the bases is favored to reflect mild atelectasis. There is no
edema, consolidation, effusion, or pneumothorax. Hyperinflation with
flat diaphragm.
IMPRESSION: Probable COPD. Chronic cardiomegaly. No acute superimposed finding.

## 2017-04-01 ENCOUNTER — Other Ambulatory Visit: Payer: Self-pay | Admitting: Internal Medicine

## 2017-04-05 ENCOUNTER — Ambulatory Visit: Payer: Medicare Other | Admitting: Podiatry

## 2017-04-06 NOTE — Addendum Note (Signed)
Addended by: Neomia DearPOWERS, Leiani Enright E on: 04/06/2017 06:38 AM   Modules accepted: Orders

## 2017-04-10 ENCOUNTER — Emergency Department (HOSPITAL_COMMUNITY): Payer: Medicare Other

## 2017-04-10 ENCOUNTER — Inpatient Hospital Stay (HOSPITAL_COMMUNITY)
Admission: EM | Admit: 2017-04-10 | Discharge: 2017-04-16 | DRG: 872 | Disposition: A | Discharge status: Home / Self Care | Payer: Medicare Other | Attending: Oncology | Admitting: Oncology

## 2017-04-10 DIAGNOSIS — Z960 Presence of urogenital implants: Secondary | ICD-10-CM | POA: Diagnosis not present

## 2017-04-10 DIAGNOSIS — M19072 Primary osteoarthritis, left ankle and foot: Secondary | ICD-10-CM | POA: Diagnosis not present

## 2017-04-10 DIAGNOSIS — I482 Chronic atrial fibrillation, unspecified: Secondary | ICD-10-CM

## 2017-04-10 DIAGNOSIS — R509 Fever, unspecified: Secondary | ICD-10-CM | POA: Diagnosis not present

## 2017-04-10 DIAGNOSIS — I160 Hypertensive urgency: Secondary | ICD-10-CM | POA: Diagnosis present

## 2017-04-10 DIAGNOSIS — L97929 Non-pressure chronic ulcer of unspecified part of left lower leg with unspecified severity: Secondary | ICD-10-CM | POA: Diagnosis not present

## 2017-04-10 DIAGNOSIS — L97522 Non-pressure chronic ulcer of other part of left foot with fat layer exposed: Secondary | ICD-10-CM | POA: Diagnosis present

## 2017-04-10 DIAGNOSIS — Z7982 Long term (current) use of aspirin: Secondary | ICD-10-CM | POA: Diagnosis not present

## 2017-04-10 DIAGNOSIS — M109 Gout, unspecified: Secondary | ICD-10-CM | POA: Diagnosis present

## 2017-04-10 DIAGNOSIS — I878 Other specified disorders of veins: Secondary | ICD-10-CM | POA: Diagnosis present

## 2017-04-10 DIAGNOSIS — Z89421 Acquired absence of other right toe(s): Secondary | ICD-10-CM

## 2017-04-10 DIAGNOSIS — L03116 Cellulitis of left lower limb: Secondary | ICD-10-CM | POA: Diagnosis present

## 2017-04-10 DIAGNOSIS — M86661 Other chronic osteomyelitis, right tibia and fibula: Secondary | ICD-10-CM | POA: Diagnosis present

## 2017-04-10 DIAGNOSIS — L97919 Non-pressure chronic ulcer of unspecified part of right lower leg with unspecified severity: Secondary | ICD-10-CM | POA: Diagnosis not present

## 2017-04-10 DIAGNOSIS — L03115 Cellulitis of right lower limb: Secondary | ICD-10-CM | POA: Diagnosis present

## 2017-04-10 DIAGNOSIS — Z79899 Other long term (current) drug therapy: Secondary | ICD-10-CM

## 2017-04-10 DIAGNOSIS — Z89412 Acquired absence of left great toe: Secondary | ICD-10-CM | POA: Diagnosis not present

## 2017-04-10 DIAGNOSIS — R6520 Severe sepsis without septic shock: Secondary | ICD-10-CM | POA: Diagnosis not present

## 2017-04-10 DIAGNOSIS — N183 Chronic kidney disease, stage 3 (moderate): Secondary | ICD-10-CM | POA: Diagnosis present

## 2017-04-10 DIAGNOSIS — D638 Anemia in other chronic diseases classified elsewhere: Secondary | ICD-10-CM | POA: Diagnosis present

## 2017-04-10 DIAGNOSIS — R7881 Bacteremia: Secondary | ICD-10-CM | POA: Diagnosis not present

## 2017-04-10 DIAGNOSIS — I129 Hypertensive chronic kidney disease with stage 1 through stage 4 chronic kidney disease, or unspecified chronic kidney disease: Secondary | ICD-10-CM | POA: Diagnosis present

## 2017-04-10 DIAGNOSIS — I739 Peripheral vascular disease, unspecified: Secondary | ICD-10-CM | POA: Diagnosis not present

## 2017-04-10 DIAGNOSIS — R600 Localized edema: Secondary | ICD-10-CM | POA: Diagnosis present

## 2017-04-10 DIAGNOSIS — M8618 Other acute osteomyelitis, other site: Secondary | ICD-10-CM | POA: Diagnosis not present

## 2017-04-10 DIAGNOSIS — L97512 Non-pressure chronic ulcer of other part of right foot with fat layer exposed: Secondary | ICD-10-CM | POA: Diagnosis present

## 2017-04-10 DIAGNOSIS — Z87891 Personal history of nicotine dependence: Secondary | ICD-10-CM

## 2017-04-10 DIAGNOSIS — L97322 Non-pressure chronic ulcer of left ankle with fat layer exposed: Secondary | ICD-10-CM | POA: Diagnosis present

## 2017-04-10 DIAGNOSIS — A4102 Sepsis due to Methicillin resistant Staphylococcus aureus: Principal | ICD-10-CM | POA: Diagnosis present

## 2017-04-10 DIAGNOSIS — N400 Enlarged prostate without lower urinary tract symptoms: Secondary | ICD-10-CM | POA: Diagnosis present

## 2017-04-10 DIAGNOSIS — A411 Sepsis due to other specified staphylococcus: Secondary | ICD-10-CM

## 2017-04-10 DIAGNOSIS — A419 Sepsis, unspecified organism: Secondary | ICD-10-CM | POA: Diagnosis present

## 2017-04-10 DIAGNOSIS — R0602 Shortness of breath: Secondary | ICD-10-CM | POA: Diagnosis not present

## 2017-04-10 DIAGNOSIS — L97312 Non-pressure chronic ulcer of right ankle with fat layer exposed: Secondary | ICD-10-CM | POA: Diagnosis present

## 2017-04-10 DIAGNOSIS — I480 Paroxysmal atrial fibrillation: Secondary | ICD-10-CM | POA: Diagnosis not present

## 2017-04-10 DIAGNOSIS — M8668 Other chronic osteomyelitis, other site: Secondary | ICD-10-CM | POA: Diagnosis not present

## 2017-04-10 DIAGNOSIS — M86662 Other chronic osteomyelitis, left tibia and fibula: Secondary | ICD-10-CM | POA: Diagnosis present

## 2017-04-10 DIAGNOSIS — M8669 Other chronic osteomyelitis, multiple sites: Secondary | ICD-10-CM | POA: Diagnosis not present

## 2017-04-10 DIAGNOSIS — B9562 Methicillin resistant Staphylococcus aureus infection as the cause of diseases classified elsewhere: Secondary | ICD-10-CM | POA: Diagnosis not present

## 2017-04-10 DIAGNOSIS — E872 Acidosis, unspecified: Secondary | ICD-10-CM

## 2017-04-10 DIAGNOSIS — S91002A Unspecified open wound, left ankle, initial encounter: Secondary | ICD-10-CM | POA: Diagnosis not present

## 2017-04-10 DIAGNOSIS — M86171 Other acute osteomyelitis, right ankle and foot: Secondary | ICD-10-CM | POA: Diagnosis present

## 2017-04-10 DIAGNOSIS — M19071 Primary osteoarthritis, right ankle and foot: Secondary | ICD-10-CM | POA: Diagnosis not present

## 2017-04-10 DIAGNOSIS — J9811 Atelectasis: Secondary | ICD-10-CM | POA: Diagnosis present

## 2017-04-10 DIAGNOSIS — S91001A Unspecified open wound, right ankle, initial encounter: Secondary | ICD-10-CM | POA: Diagnosis not present

## 2017-04-10 DIAGNOSIS — M7989 Other specified soft tissue disorders: Secondary | ICD-10-CM | POA: Diagnosis not present

## 2017-04-10 LAB — CBC WITH DIFFERENTIAL/PLATELET
BASOS PCT: 0 %
Basophils Absolute: 0 10*3/uL (ref 0.0–0.1)
EOS PCT: 0 %
Eosinophils Absolute: 0 10*3/uL (ref 0.0–0.7)
HCT: 27.8 % — ABNORMAL LOW (ref 39.0–52.0)
HEMOGLOBIN: 8.9 g/dL — AB (ref 13.0–17.0)
LYMPHS ABS: 1.1 10*3/uL (ref 0.7–4.0)
LYMPHS PCT: 4 %
MCH: 26.6 pg (ref 26.0–34.0)
MCHC: 32 g/dL (ref 30.0–36.0)
MCV: 83 fL (ref 78.0–100.0)
MONOS PCT: 3 %
Monocytes Absolute: 0.8 10*3/uL (ref 0.1–1.0)
NEUTROS ABS: 25.3 10*3/uL — AB (ref 1.7–7.7)
Neutrophils Relative %: 93 %
Platelets: 356 10*3/uL (ref 150–400)
RBC: 3.35 MIL/uL — AB (ref 4.22–5.81)
RDW: 15 % (ref 11.5–15.5)
WBC: 27.2 10*3/uL — ABNORMAL HIGH (ref 4.0–10.5)

## 2017-04-10 LAB — URINALYSIS, ROUTINE W REFLEX MICROSCOPIC
BACTERIA UA: NONE SEEN
BILIRUBIN URINE: NEGATIVE
Glucose, UA: NEGATIVE mg/dL
KETONES UR: NEGATIVE mg/dL
LEUKOCYTES UA: NEGATIVE
Nitrite: NEGATIVE
Protein, ur: NEGATIVE mg/dL
SQUAMOUS EPITHELIAL / LPF: NONE SEEN
Specific Gravity, Urine: 1.008 (ref 1.005–1.030)
pH: 6 (ref 5.0–8.0)

## 2017-04-10 LAB — COMPREHENSIVE METABOLIC PANEL
ALBUMIN: 2.4 g/dL — AB (ref 3.5–5.0)
ALK PHOS: 80 U/L (ref 38–126)
ALT: 6 U/L — AB (ref 17–63)
AST: 13 U/L — AB (ref 15–41)
Anion gap: 6 (ref 5–15)
BUN: 18 mg/dL (ref 6–20)
CALCIUM: 9.4 mg/dL (ref 8.9–10.3)
CO2: 24 mmol/L (ref 22–32)
CREATININE: 1.84 mg/dL — AB (ref 0.61–1.24)
Chloride: 109 mmol/L (ref 101–111)
GFR calc Af Amer: 36 mL/min — ABNORMAL LOW (ref >60)
GFR calc non Af Amer: 31 mL/min — ABNORMAL LOW (ref >60)
GLUCOSE: 112 mg/dL — AB (ref 65–99)
Potassium: 3.9 mmol/L (ref 3.5–5.1)
SODIUM: 139 mmol/L (ref 135–145)
Total Bilirubin: 0.4 mg/dL (ref 0.3–1.2)
Total Protein: 8.3 g/dL — ABNORMAL HIGH (ref 6.5–8.1)

## 2017-04-10 LAB — SEDIMENTATION RATE: SED RATE: 130 mm/h — AB (ref 0–16)

## 2017-04-10 LAB — PROTIME-INR
INR: 1.13
PROTHROMBIN TIME: 14.5 s (ref 11.4–15.2)

## 2017-04-10 LAB — I-STAT CG4 LACTIC ACID, ED
Lactic Acid, Venous: 2.43 mmol/L (ref 0.5–1.9)
Lactic Acid, Venous: 1.26 mmol/L (ref 0.5–1.9)

## 2017-04-10 LAB — C-REACTIVE PROTEIN: CRP: 12.4 mg/dL — ABNORMAL HIGH (ref <1.0)

## 2017-04-10 MED ORDER — HEPARIN SODIUM (PORCINE) 5000 UNIT/ML IJ SOLN
5000.0000 [IU] | Freq: Three times a day (TID) | INTRAMUSCULAR | Status: DC
  Administered 2017-04-10 – 2017-04-12 (×2): 5000 [IU] via SUBCUTANEOUS
  Filled 2017-04-10 (×4): qty 1

## 2017-04-10 MED ORDER — ASPIRIN EC 81 MG PO TBEC
81.0000 mg | DELAYED_RELEASE_TABLET | Freq: Every day | ORAL | Status: DC
  Administered 2017-04-11 – 2017-04-16 (×6): 81 mg via ORAL
  Filled 2017-04-10 (×6): qty 1

## 2017-04-10 MED ORDER — AMLODIPINE BESYLATE 5 MG PO TABS
5.0000 mg | ORAL_TABLET | Freq: Every day | ORAL | Status: DC
  Administered 2017-04-10 – 2017-04-16 (×7): 5 mg via ORAL
  Filled 2017-04-10 (×7): qty 1

## 2017-04-10 MED ORDER — ACETAMINOPHEN 325 MG PO TABS: 650.0000 mg | ORAL_TABLET | Freq: Four times a day (QID) | ORAL | Status: DC | PRN

## 2017-04-10 MED ORDER — VANCOMYCIN HCL IN DEXTROSE 750-5 MG/150ML-% IV SOLN
750.0000 mg | INTRAVENOUS | Status: DC
  Administered 2017-04-11 – 2017-04-15 (×5): 750 mg via INTRAVENOUS
  Filled 2017-04-10 (×6): qty 150

## 2017-04-10 MED ORDER — VANCOMYCIN HCL 10 G IV SOLR
1500.0000 mg | Freq: Once | INTRAVENOUS | Status: AC
  Administered 2017-04-10: 1500 mg via INTRAVENOUS
  Filled 2017-04-10: qty 1500

## 2017-04-10 MED ORDER — IBUPROFEN 200 MG PO TABS: 200.0000 mg | ORAL_TABLET | Freq: Four times a day (QID) | ORAL | Status: DC | PRN

## 2017-04-10 MED ORDER — ACETAMINOPHEN 325 MG PO TABS
650.0000 mg | ORAL_TABLET | Freq: Once | ORAL | Status: AC | PRN
  Administered 2017-04-10: 650 mg via ORAL
  Filled 2017-04-10: qty 2

## 2017-04-10 MED ORDER — DEXTROSE 5 % IV SOLN
1.0000 g | INTRAVENOUS | Status: DC
  Administered 2017-04-11 – 2017-04-12 (×2): 1 g via INTRAVENOUS
  Filled 2017-04-10 (×2): qty 1

## 2017-04-10 MED ORDER — ACETAMINOPHEN 500 MG PO TABS
1000.0000 mg | ORAL_TABLET | Freq: Three times a day (TID) | ORAL | Status: DC | PRN
  Filled 2017-04-10 (×2): qty 2

## 2017-04-10 MED ORDER — FUROSEMIDE 40 MG PO TABS
40.0000 mg | ORAL_TABLET | Freq: Every day | ORAL | Status: DC
  Administered 2017-04-11 – 2017-04-16 (×6): 40 mg via ORAL
  Filled 2017-04-10 (×6): qty 1

## 2017-04-10 MED ORDER — SODIUM CHLORIDE 0.9 % IV BOLUS (SEPSIS)
1000.0000 mL | Freq: Once | INTRAVENOUS | Status: AC
  Administered 2017-04-10: 1000 mL via INTRAVENOUS

## 2017-04-10 MED ORDER — FINASTERIDE 5 MG PO TABS
5.0000 mg | ORAL_TABLET | Freq: Every day | ORAL | Status: DC
  Administered 2017-04-11 – 2017-04-16 (×6): 5 mg via ORAL
  Filled 2017-04-10 (×6): qty 1

## 2017-04-10 MED ORDER — PIPERACILLIN-TAZOBACTAM 3.375 G IVPB 30 MIN
3.3750 g | Freq: Once | INTRAVENOUS | Status: AC
  Administered 2017-04-10: 3.375 g via INTRAVENOUS
  Filled 2017-04-10: qty 50

## 2017-04-10 NOTE — Progress Notes (Signed)
Pharmacy Antibiotic Note  Vincent Miranda is a 83 y.o. male admitted on 04/10/2017 with cellulitis and r/o PNA.  Pharmacy has been consulted for vancomycin dosing. Tmax 103.2, WBC elevated at 27.2, SCr elevated at 1.84 and lactic acid >2. Pt has completed multiple long-term courses of doxycycline.   Plan: Vancomycin 1500mg  IV x 1 then 750mg  IV Q24H F/u renal fxn, C&S, clinical status and [facility] F/u continuation of zosyn or other gram negative coverage  Height: 5\' 11"  (180.3 cm) Weight: 166 lb 0.1 oz (75.3 kg) IBW/kg (Calculated) : 75.3  Temp (24hrs), Avg:103.2 F (39.6 C), Min:103.2 F (39.6 C), Max:103.2 F (39.6 C)  No results for input(s): WBC, CREATININE, LATICACIDVEN, VANCOTROUGH, VANCOPEAK, VANCORANDOM, GENTTROUGH, GENTPEAK, GENTRANDOM, TOBRATROUGH, TOBRAPEAK, TOBRARND, AMIKACINPEAK, AMIKACINTROU, AMIKACIN in the last 168 hours.  CrCl cannot be calculated (Patient's most recent lab result is older than the maximum 21 days allowed.).    No Known Allergies  Antimicrobials this admission: Vanc 5/22>> Zosyn x 1 5/22  Dose adjustments this admission: N/A  Microbiology results: Pending  Thank you for allowing pharmacy to be a part of this patient's care.  Vincent Miranda, Drake LeachRachel Miranda 04/10/2017 8:51 PM

## 2017-04-10 NOTE — ED Notes (Signed)
Heparin not given , refused by family at bedside , admitting MD notified by family at bedside .

## 2017-04-10 NOTE — ED Triage Notes (Signed)
Pt brought in by wife with his c/o tremors and feeling cold. She checked his BP and it was 200/156 and tachycardic  "over 100". Wife states these tremors are not normal for him. HR at triage 114 and BP 205/108. Pt smells of malodorous urine.

## 2017-04-10 NOTE — H&P (Signed)
Date: 04/10/2017               Patient Name:  Vincent Miranda MRN: [id number]  DOB: December 03, 1924 Age / Sex: 83 y.o., male   PCP: Jule Ser, DO         Medical Service: Internal Medicine Teaching Service         Attending Physician: Dr. Algis Greenhouse    First Contact: Dr. Lorella Nimrod Pager: [phone]  Second Contact: Dr. Shela Leff Pager: [phone]       After Hours (After 5p/  First Contact Pager: [phone]  weekends / holidays): Second Contact Pager: [phone]   Chief Complaint: Tachycardia and hypertension  History of Present Illness: Vincent Miranda is a 83 y.o. gentleman with PMH gout, chronic leg wounds and osteomyelitis secondary to PVD, PAF not on anticoagualtion, HTN, and CKD3 who presents from home when his wife found him to have BP 200/156 and HR > 100. He has been feeling well until this morning when he noticed some mild chills. His wife noticed him shaking this evening and checked his blood pressure and was surprised to see it so elevated, as he typically is normotensive and takes no blood pressure medications. He was hospitalized in January for osteomyelitis and completed a 4 week course of doxycycline and his leg wounds have looked good since. His wife changes his dressings every other day and has noticed no signs of infection. He denies fever, pain, cough, dyspnea, diarrhea/constipation, dysuria, nausea/vomiting. He has good appetite and has been acting himself with no complaints aside from chills today.  In the ED he was found to be febrile to 103.2, HR 114, RR 22, BP 205/108, SpO2 83% on room air which corrected with 1L O2 via [name]. Labs/imaging remarkable for WBC 27.2 (93% neut), ESR 130, CRP 12.4, lactic acid 2.4, and plain films of his lower legs revealed diffuse soft tissue swelling, edema, and concern for developing osteomyelitis of the right medial malleolus. He received IV fluids, Vancomycin, Zosyn, Tylenol and IMTS was contacted for  admission.    Meds:  Current Meds  Medication Sig  . allopurinol (ZYLOPRIM) 100 MG tablet Take 2 tablets (200 mg total) by mouth daily. (Patient taking differently: Take 100 mg by mouth 2 (two) times daily. )  . aspirin EC 81 MG tablet Take 81 mg by mouth daily.   . finasteride (PROSCAR) 5 MG tablet Take 1 tablet (5 mg total) by mouth daily.  . furosemide (LASIX) 40 MG tablet Take 1 tablet (40 mg total) by mouth daily.  Marland Kitchen ibuprofen (ADVIL,MOTRIN) 200 MG tablet Take 200-400 mg by mouth every 6 (six) hours as needed for mild pain.  . potassium chloride SA (K-DUR,KLOR-CON) 20 MEQ tablet Take 20 mEq by mouth daily.  . Zinc 50 MG TABS Take 50 mg by mouth daily.    Allergies: Allergies as of 04/10/2017  . (No Known Allergies)   Past Medical History:  Diagnosis Date  . Acute blood loss anemia 10/18/2013  . Anemia of chronic disease   . Atrial fibrillation ([place])   . Chronic kidney disease   . Chronic osteomyelitis ([place])   . Gout   . History of blood transfusion   . Hypertension   . PVD (peripheral vascular disease) ([place])     Family History:  Denies family history of peripheral vascular disease or recurrent infections  Social History:  Social History   Social History  . Marital status: Married    Spouse name: N/A  .  Number of children: N/A  . Years of education: N/A   Occupational History  . Not on file.   Social History Main Topics  . Smoking status: Former Smoker    Packs/day: 1.00    Years: 43.00    Types: Cigarettes  . Smokeless tobacco: Never Used     Comment: "quit smoking cigarettes in the 1980s"  . Alcohol use Yes     Comment: "nothing anymore" (02/03/2016)  . Drug use: No  . Sexual activity: Not Currently   Other Topics Concern  . Not on file   Social History Narrative  . No narrative on file    Review of Systems: A complete ROS was negative except as per HPI.   Physical Exam: Blood pressure (!) 141/74, pulse (!) 113, temperature (!) 103.2 F  (39.6 C), temperature source Oral, resp. rate (!) 26, height _0  (1.803 m), weight 166 lb 0.1 oz (75.3 kg), SpO2 (!) 85 %.  General appearance: Thin elderly gentleman resting comfortably in bed, in no distress, soft spoken with poor hearing HENT: Normocephalic, atraumatic, moist mucous membranes Eyes: PERRL, non-icteric Cardiovascular: Tachycardic rate and regular rhythm, no murmurs, rubs, gallops Respiratory: Diminished breath sounds bilaterally, normal work of breathing Abdomen: BS+, soft, non-tender, non-distended Extremities: Dry ulcerative changes to lower legs and feet bilaterally with minimal surrounding erythema and warmth, no odor, 2+ pitting edema to knees bilaterally, radial pulses 2+ Skin: Warm, dry, intact, decreased skin turgor Neuro: Alert and oriented Psych: Appropriate affect, clear speech, thoughts linear and goal-directed  EKG: Afib with RVR, RBBB  CXR: Emphysematous changes in the lungs. No active pulmonary disease. Cardiac enlargement.  Assessment & Plan by Problem: Active Problems:   Sepsis ([place])  Sepsis, likely secondary to acute on chronic osteomyelitis, presenting with fever, leukocytosis, tachycardia, tachypnea, and elevated lactate, ESR, CRP. Hypertensive. Only one day of chills, unimpressive clinical history and appears quite comfortable on exam. Chronic leg wounds do not appear acutely infected on surface, perhaps mildly more erythematous and warm than usual. No drainage or odor typical of his previous infections though. XR concerning for bony erosion in right medial malleolus. Patient is adamantly against any surgical treatment and prefers antibiotics. Will likely require a few days of IV antibiotics and chronic doxycycline indefinitely, unless another source can be identified and treated (though UA and CXR unremarkable). Received Vanc, Zosyn, IVF in ED. -- Follow blood and urine cultures -- Vancomycin and (change to) Cefepime per pharm  -- Tylenol Q6h PRN  for pain / fever -- Wound care consult -- Trend CBC, BMP  Hypertensive urgency, BP 205/108 on arrival, seems to have improved after Tylenol, IV fluids, and antibiotics to 160s/106. Normotensive at baseline and takes not antihypertensives. -- Amlodipine 5 mg tonight -- Trend BP  Peripheral vascular disease -- continue home Aspirin 81 mg daily  Chronic leg edema -- continue home Lasix 40 mg QD  BPH -- continue home Finasteride 5 mg QD  FEN/GI: Heart healthy diet, replete electrolytes as needed  DVT ppx: Refused, SCDs contraindicated in PAD  Code status: Full code  Dispo: Admit patient to Observation with expected length of stay less than 2 midnights.  Signed: Asencion Partridge, MD 04/10/2017, 11:16 PM  Pager: [phone]

## 2017-04-10 NOTE — Progress Notes (Signed)
Pharmacy Antibiotic Note Hope PigeonRobert T Vespa is a 83 y.o. male admitted on 04/10/2017 with concern for osteomyelitis.  Pharmacy has been consulted for and Cefepime and vancomycin dosing. Tmax 103.2, WBC elevated at 27.2, SCr elevated at 1.84 and lactic acid >2. Pt has completed multiple long-term courses of doxycycline.   Plan: Vancomycin 1500mg  IV x 1 then 750mg  IV Q24H Cefepime 1 gram IV every 24 hours  F/u renal fxn, C&S, clinical status and trough at [facility]  Height: 5\' 11"  (180.3 cm) Weight: 166 lb 0.1 oz (75.3 kg) IBW/kg (Calculated) : 75.3  Temp (24hrs), Avg:103.2 F (39.6 C), Min:103.2 F (39.6 C), Max:103.2 F (39.6 C)   Recent Labs Lab 04/10/17 2100 04/10/17 2122 04/10/17 2310  WBC 27.2*  --   --   CREATININE 1.84*  --   --   LATICACIDVEN  --  2.43* 1.26    Estimated Creatinine Clearance: 28.4 mL/min (A) (by C-G formula based on SCr of 1.84 mg/dL (H)).    No Known Allergies  Antimicrobials this admission:  Vancomycin 5/22>>  Cefepime 5/23 >>  Zosyn x 1 5/22  Dose adjustments this admission: N/A  Microbiology results: Pending  Thank you for allowing pharmacy to be a part of this patient's care.  Pollyann SamplesAndy Jamaya Sleeth, PharmD, BCPS 04/10/2017, 11:19 PM

## 2017-04-10 NOTE — ED Provider Notes (Signed)
[facility] DEPT Provider Note   CSN: [id number] Arrival date & time: 04/10/17  2001     History   Chief Complaint Chief Complaint  Patient presents with  . Tachycardia    HPI Vincent Miranda is a 83 y.o. male.  The history is provided by the patient.  Fever   This is a new problem. The current episode started 3 to 5 hours ago. The problem occurs constantly. The problem has not changed since onset.His temperature was unmeasured prior to arrival. Pertinent negatives include no chest pain, no fussiness, no sleepiness, no diarrhea, no vomiting, no congestion, no headaches, no sore throat, no muscle aches and no cough. Associated symptoms comments: Chronic b/l foot ulcers and hx of chronic osteomyelitis with no current antibiotics. Patient with no dysuria, no SOB, no cough, no chest pain.Marland Kitchen He has tried nothing for the symptoms. The treatment provided no relief.    Past Medical History:  Diagnosis Date  . Acute blood loss anemia 10/18/2013  . Anemia of chronic disease   . Atrial fibrillation ([place])   . Chronic kidney disease   . Chronic osteomyelitis ([place])   . Gout   . History of blood transfusion   . Hypertension   . PVD (peripheral vascular disease) [facility])     Patient Active Problem List   Diagnosis Date Noted  . Sepsis ([place]) 04/10/2017  . Chronic osteomyelitis ([place]) 12/09/2016  . Rash and nonspecific skin eruption 09/08/2016  . Abnormal urinalysis 09/08/2016  . Paroxysmal atrial fibrillation ([place]) 03/01/2016  . Urinary retention 02/15/2016  . Gout 02/03/2016  . Atherosclerotic peripheral vascular disease with gangrene ([place]) 03/28/2015  . Anemia of chronic disease   . CKD (chronic kidney disease), stage III 10/18/2013  . Essential hypertension   . Protein-calorie malnutrition, severe ([place]) 08/07/2013    Past Surgical History:  Procedure Laterality Date  . AMPUTATION Left 10/19/2013   Procedure: AMPUTATION RAY  LEFT GREAT TOE;  Surgeon: Newt Minion, MD;   Location: [facility];  Service: Orthopedics;  Laterality: Left;  . EYE SURGERY Right    "had accident; had to replace lens in eye"       Home Medications    Prior to Admission medications   Medication Sig Start Date End Date Taking? Authorizing Provider  allopurinol (ZYLOPRIM) 100 MG tablet Take 2 tablets (200 mg total) by mouth daily. Patient taking differently: Take 100 mg by mouth 2 (two) times daily.  03/21/16  Yes Loleta Chance, MD  aspirin EC 81 MG tablet Take 81 mg by mouth daily.    Yes [provider]  finasteride (PROSCAR) 5 MG tablet Take 1 tablet (5 mg total) by mouth daily. 06/22/16 06/22/17 Yes Jule Ser, DO  furosemide (LASIX) 40 MG tablet Take 1 tablet (40 mg total) by mouth daily. 06/24/16  Yes Jule Ser, DO  ibuprofen (ADVIL,MOTRIN) 200 MG tablet Take 200-400 mg by mouth every 6 (six) hours as needed for mild pain.   Yes [provider]  potassium chloride SA (K-DUR,KLOR-CON) 20 MEQ tablet Take 20 mEq by mouth daily. 01/08/17  Yes [provider]  Zinc 50 MG TABS Take 50 mg by mouth daily.   Yes [provider]  albuterol (PROVENTIL HFA;VENTOLIN HFA) 108 (90 Base) MCG/ACT inhaler Inhale 2 puffs into the lungs every 6 (six) hours as needed for wheezing or shortness of breath. Patient not taking: Reported on 04/10/2017 09/10/16   Maryellen Pile, MD  SSD 1 % cream apply to affected area once daily  Patient not taking: Reported on 04/10/2017 04/02/17   Jule Ser, DO    Family History No family history on file.  Social History Social History  Substance Use Topics  . Smoking status: Former Smoker    Packs/day: 1.00    Years: 43.00    Types: Cigarettes  . Smokeless tobacco: Never Used     Comment: "quit smoking cigarettes in the 1980s"  . Alcohol use Yes     Comment: "nothing anymore" (02/03/2016)     Allergies   Patient has no known allergies.   Review of Systems Review of Systems  Constitutional: Positive for fever.  Negative for chills.  HENT: Negative for congestion, ear pain and sore throat.   Eyes: Negative for pain and visual disturbance.  Respiratory: Negative for cough and shortness of breath.   Cardiovascular: Negative for chest pain and palpitations.  Gastrointestinal: Negative for abdominal pain, diarrhea and vomiting.  Genitourinary: Negative for dysuria and hematuria.  Musculoskeletal: Negative for arthralgias and back pain.  Skin: Positive for wound. Negative for color change and rash.  Neurological: Negative for seizures, syncope and headaches.  All other systems reviewed and are negative.    Physical Exam Updated Vital Signs  ED Triage Vitals  Enc Vitals Group     BP 04/10/17 2011 (!) 205/108     Pulse Rate 04/10/17 2011 (!) 114     Resp 04/10/17 2011 (!) 22     Temp 04/10/17 2011 (!) 103.2 F (39.6 C)     Temp Source 04/10/17 2011 Oral     SpO2 04/10/17 2011 (!) 83 %     Weight 04/10/17 2016 166 lb 0.1 oz (75.3 kg)     Height 04/10/17 2016 5' 11"  (1.803 m)     Head Circumference --      Peak Flow --      Pain Score 04/10/17 2005 0     Pain Loc --      Pain Edu? --      Excl. in [place]? --     Physical Exam  Constitutional: He is oriented to person, place, and time. He appears well-developed. He has a sickly appearance.  HENT:  Head: Normocephalic and atraumatic.  Eyes: Conjunctivae and EOM are normal. Pupils are equal, round, and reactive to light.  Neck: Normal range of motion. Neck supple.  Cardiovascular: Regular rhythm and normal heart sounds.  Tachycardia present.   No murmur heard. Pulmonary/Chest: Effort normal and breath sounds normal. No respiratory distress. He has no wheezes. He has no rales.  Abdominal: Soft. Bowel sounds are normal. He exhibits no distension. There is no tenderness.  Musculoskeletal: Normal range of motion. He exhibits no edema.  Neurological: He is alert and oriented to person, place, and time.  Skin: Skin is warm and dry.  Bilaterally  feet s/p amuptations with ulcerated skin, right foot with deep ulcer underneath foot, no obvious purulent drainage b/l, foul smelling  Psychiatric: He has a normal mood and affect.  Nursing note and vitals reviewed.    ED Treatments / Results  Labs (all labs ordered are listed, but only abnormal results are displayed) Labs Reviewed  COMPREHENSIVE METABOLIC PANEL - Abnormal; Notable for the following:       Result Value   Glucose, Bld 112 (*)    Creatinine, Ser 1.84 (*)    Total Protein 8.3 (*)    Albumin 2.4 (*)    AST 13 (*)    ALT 6 (*)    GFR calc  non Af Amer 31 (*)    GFR calc Af Amer 36 (*)    All other components within normal limits  CBC WITH DIFFERENTIAL/PLATELET - Abnormal; Notable for the following:    WBC 27.2 (*)    RBC 3.35 (*)    Hemoglobin 8.9 (*)    HCT 27.8 (*)    Neutro Abs 25.3 (*)    All other components within normal limits  URINALYSIS, ROUTINE W REFLEX MICROSCOPIC - Abnormal; Notable for the following:    Color, Urine STRAW (*)    Hgb urine dipstick SMALL (*)    All other components within normal limits  SEDIMENTATION RATE - Abnormal; Notable for the following:    Sed Rate 130 (*)    All other components within normal limits  C-REACTIVE PROTEIN - Abnormal; Notable for the following:    CRP 12.4 (*)    All other components within normal limits  I-STAT CG4 LACTIC ACID, ED - Abnormal; Notable for the following:    Lactic Acid, Venous 2.43 (*)    All other components within normal limits  CULTURE, BLOOD (ROUTINE X 2)  CULTURE, BLOOD (ROUTINE X 2)  URINE CULTURE  PROTIME-INR  BASIC METABOLIC PANEL  CBC  I-STAT CG4 LACTIC ACID, ED    EKG  EKG Interpretation  Date/Time:  Tuesday Apr 10 2017 20:12:52 EDT Ventricular Rate:  112 PR Interval:    QRS Duration: 126 QT Interval:  362 QTC Calculation: 494 R Axis:   -123 Text Interpretation:  Atrial fibrillation with rapid ventricular response Right bundle branch block T wave abnormality, consider  inferior ischemia Abnormal ECG Interpretation limited secondary to artifact Confirmed by Varney Biles ([id number]) on 04/10/2017 8:51:45 PM       Radiology Dg Chest 2 View  Result Date: 04/10/2017 CLINICAL DATA:  Possible sepsis. Wounds to both feet and ankles. Shortness of breath. EXAM: CHEST  2 VIEW COMPARISON:  09/09/2016 FINDINGS: Cardiac enlargement without vascular congestion. Atelectasis or infiltration in both lung bases. No blunting of costophrenic angles. No pneumothorax. Probable emphysematous changes in the lungs. Calcification of the aorta. Degenerative changes in the right shoulder. IMPRESSION: Emphysematous changes in the lungs. No active pulmonary disease. Cardiac enlargement. Electronically Signed   By: Lucienne Capers M.D.   On: 04/10/2017 22:41   Dg Ankle Complete Left  Result Date: 04/10/2017 CLINICAL DATA:  Possible sepsis. Chronic ulcers to the feet and ankles. Shortness of breath. EXAM: LEFT ANKLE COMPLETE - 3+ VIEW COMPARISON:  Left tib-fib 12/08/2016 FINDINGS: There is periosteal reaction along the distal right tibia and fibula, similar to previous study. This likely represents reactive bone. No evidence of bone erosion to suggest osteomyelitis. Diffuse soft tissue swelling and edema. No radiopaque foreign bodies or soft tissue gas collections. Vascular calcifications. Degenerative changes in the ankle joint. IMPRESSION: Chronic periosteal reaction along the distal tibia and fibula. Diffuse soft tissue swelling and edema. No definite evidence of osteomyelitis. Electronically Signed   By: Lucienne Capers M.D.   On: 04/10/2017 22:44   Dg Ankle Complete Right  Result Date: 04/10/2017 CLINICAL DATA:  Possible sepsis. Wounds to both feet and ankles. Chronic ulcers. EXAM: RIGHT ANKLE - COMPLETE 3+ VIEW COMPARISON:  Right tib-fib 12/08/2016 FINDINGS: There is chronic periosteal reaction along the medial malleolus and posterior aspect of the distal tibia. Cortical changes are  increasing since previous study and there is now cortical loss and irregularity demonstrated along the medial malleolus, new since previous study. There is overlying ulceration in this area. This  suggests developing acute osteomyelitis. Degenerative changes in the ankle joint and intertarsal joints. Small calcaneal spurs. Diffuse soft tissue swelling and edema. No soft tissue gas or radiopaque foreign bodies demonstrated. Vascular calcifications. IMPRESSION: Chronic periosteal reaction along the distal tibia and fibula. New developing cortical loss and irregularity along the medial malleolus suggesting developing osteomyelitis. Diffuse soft tissue swelling and edema. Electronically Signed   By: Lucienne Capers M.D.   On: 04/10/2017 23:06   Dg Foot Complete Left  Result Date: 04/10/2017 CLINICAL DATA:  Bilateral foot and ankle wounds. EXAM: LEFT FOOT - COMPLETE 3+ VIEW COMPARISON:  12/08/2016 FINDINGS: Previous amputation of the left first toe at the tarsometatarsal joint. Apparent amputation of the second toe at the metatarsal-phalangeal level. Previous resection or resorption is at the distal second and third metatarsal heads. Erosions at the proximal third phalanx with displacement at the metatarsal-phalangeal joint. Appearances are similar to previous study. No new bone changes to suggest developing acute osteomyelitis. Diffuse soft tissue swelling. Vascular calcifications. No radiopaque soft tissue foreign bodies or soft tissue gas collections. Ulceration on the plantar aspect of the metatarsal phalangeal level. IMPRESSION: Soft tissue swelling and ulceration. Old bone resorptions and amputations. No change since prior study. Electronically Signed   By: Lucienne Capers M.D.   On: 04/10/2017 23:03   Dg Foot Complete Right  Result Date: 04/10/2017 CLINICAL DATA:  Bilateral foot and ankle wounds.  Possible sepsis. EXAM: RIGHT FOOT COMPLETE - 3+ VIEW COMPARISON:  12/08/2016 FINDINGS: Chronic ulceration and  soft tissue loss along the lateral aspect of the right forefoot. No radiopaque soft tissue foreign bodies or soft tissue gas. Previous amputation of the right fifth toe at the mid metatarsal region. There is bone resorption at the distal right fourth metatarsal head and of the proximal phalanx of the right fourth toe with acute angulation of the right fourth toe. This appearance is unchanged since previous study. Degenerative changes in the first metatarsal-phalangeal joint. No obvious acute bone erosions. Mild chronic periosteal reaction along the metatarsal shafts is likely reactive and unchanged since prior study. No evidence of acute fracture or dislocation. Degenerative changes in the intertarsal joints and right ankle joint. IMPRESSION: Old bone amputations and bone resorption. Soft tissue ulceration along the lateral right foot. Degenerative changes of the first metatarsal-phalangeal joint. No change since prior study. Electronically Signed   By: Lucienne Capers M.D.   On: 04/10/2017 23:08    Procedures Procedures (including critical care time)  Medications Ordered in ED Medications  vancomycin (VANCOCIN) IVPB 750 mg/150 ml premix (not administered)  heparin injection 5,000 Units (5,000 Units Subcutaneous Given 04/10/17 2346)  amLODipine (NORVASC) tablet 5 mg (5 mg Oral Given 04/10/17 2342)  aspirin EC tablet 81 mg (not administered)  finasteride (PROSCAR) tablet 5 mg (not administered)  furosemide (LASIX) tablet 40 mg (not administered)  ceFEPIme (MAXIPIME) 1 g in dextrose 5 % 50 mL IVPB (not administered)  acetaminophen (TYLENOL) tablet 1,000 mg (not administered)  acetaminophen (TYLENOL) tablet 650 mg (not administered)  acetaminophen (TYLENOL) tablet 650 mg (650 mg Oral Given 04/10/17 2134)  sodium chloride 0.9 % bolus 1,000 mL (0 mLs Intravenous Stopped 04/10/17 2308)  piperacillin-tazobactam (ZOSYN) IVPB 3.375 g (0 g Intravenous Stopped 04/10/17 2308)  vancomycin (VANCOCIN) 1,500 mg in  sodium chloride 0.9 % 500 mL IVPB (0 mg Intravenous Stopped 04/10/17 2344)     Initial Impression / Assessment and Plan / ED Course  I have reviewed the triage vital signs and the nursing notes.  Pertinent labs & imaging results that were available during my care of the patient were reviewed by me and considered in my medical decision making (see chart for details).     Vincent Miranda is a 83 year old male with history of peripheral vascular disease, chronic osteomyelitis, chronic kidney disease, atrial fibrillation who presents to the ED with fever. Patient's vitals at time of arrival to the ED are significant for fever and tachycardia but otherwise unremarkable. Patient is hypertensive. Patient placed on 2 L of oxygen for possible hypoxia however not getting good waveform due to tremor. Patient with symptoms of fatigue for the last day. Denies cough, sputum production, dysuria. Patient has bilateral chronic osteomyelitis and has not been on antibiotics for several months. Patient has several amputations by orthopedic surgery in the past. Primary care provider currently directing care. Wife states that she has been doing dressing changes. Exam is significant for signs concerning for cellulitis and both feet. Patient has ulcerations in both feet that appear to go down to bone. Both legs are warm. Patient with clear breath sounds bilaterally, nontender abdomen. Sepsis workup initiated with possible urinary source but likely from cellulitis. Will give patient empirically IV vancomycin and Zosyn. Patient given normal saline bolus. Urinalysis, blood cultures, chest x-ray ordered as well as lactic acid. Patient not diabetic and not on immunosuppression drugs.  EKG shows atrial fibrillation with heart rate of 112 with no signs of ischemia. Patient with lactic acidosis of 2.43, leukocytosis 27. No obvious pneumonia, no urinary tract infection. Otherwise no significant electrolyte or anemia. Patient had  elevation of sedimentation rate and ESR likely secondary to cellulitis versus osteomyelitis of bilateral feet. Patient with no signs of obvious gas on foot x-rays. Patient has soft tissue swelling and ulcerations seen on both x-rays. No signs of obvious osteomyelitis. Likely source of infection and concern for bacteremia as well. Admitted to medicine service. Patient with repeat lactic acid with improvement. Patient remained hemodynamically stable throughout ED stay and transferred to the floor in stable condition.  Final Clinical Impressions(s) / ED Diagnoses   Final diagnoses:  Sepsis, due to unspecified organism [facility])  Cellulitis of left lower extremity  Cellulitis of right lower extremity  Lactic acidosis    New Prescriptions New Prescriptions   No medications on file     Lennice Sites, DO 04/11/17 [place], Ankit, MD 04/12/17 [phone]

## 2017-04-10 NOTE — ED Notes (Signed)
Nurse first informed pt should be the next to be taken back to room to see EDP.

## 2017-04-10 NOTE — ED Notes (Signed)
O2 sats read 83% on RA but Unable to get a clear O2 reading due to pts tremors and uneven pleth. 4L [place] of O2 placed on patient during triage.

## 2017-04-11 DIAGNOSIS — A419 Sepsis, unspecified organism: Secondary | ICD-10-CM

## 2017-04-11 DIAGNOSIS — E872 Acidosis, unspecified: Secondary | ICD-10-CM

## 2017-04-11 DIAGNOSIS — L03115 Cellulitis of right lower limb: Secondary | ICD-10-CM

## 2017-04-11 DIAGNOSIS — R600 Localized edema: Secondary | ICD-10-CM

## 2017-04-11 DIAGNOSIS — M86171 Other acute osteomyelitis, right ankle and foot: Secondary | ICD-10-CM

## 2017-04-11 DIAGNOSIS — M8669 Other chronic osteomyelitis, multiple sites: Secondary | ICD-10-CM

## 2017-04-11 DIAGNOSIS — Z89421 Acquired absence of other right toe(s): Secondary | ICD-10-CM

## 2017-04-11 DIAGNOSIS — I160 Hypertensive urgency: Secondary | ICD-10-CM

## 2017-04-11 DIAGNOSIS — Z89412 Acquired absence of left great toe: Secondary | ICD-10-CM

## 2017-04-11 DIAGNOSIS — Z87891 Personal history of nicotine dependence: Secondary | ICD-10-CM

## 2017-04-11 DIAGNOSIS — I482 Chronic atrial fibrillation, unspecified: Secondary | ICD-10-CM

## 2017-04-11 LAB — BLOOD CULTURE ID PANEL (REFLEXED)
ACINETOBACTER BAUMANNII: NOT DETECTED
CANDIDA PARAPSILOSIS: NOT DETECTED
CANDIDA TROPICALIS: NOT DETECTED
Candida albicans: NOT DETECTED
Candida glabrata: NOT DETECTED
Candida krusei: NOT DETECTED
Enterobacter cloacae complex: NOT DETECTED
Enterobacteriaceae species: NOT DETECTED
Enterococcus species: NOT DETECTED
Escherichia coli: NOT DETECTED
HAEMOPHILUS INFLUENZAE: NOT DETECTED
KLEBSIELLA OXYTOCA: NOT DETECTED
KLEBSIELLA PNEUMONIAE: NOT DETECTED
Listeria monocytogenes: NOT DETECTED
METHICILLIN RESISTANCE: DETECTED — AB
Neisseria meningitidis: NOT DETECTED
Proteus species: NOT DETECTED
Pseudomonas aeruginosa: NOT DETECTED
SERRATIA MARCESCENS: NOT DETECTED
STAPHYLOCOCCUS AUREUS BCID: NOT DETECTED
STAPHYLOCOCCUS SPECIES: DETECTED — AB
Streptococcus agalactiae: NOT DETECTED
Streptococcus pneumoniae: NOT DETECTED
Streptococcus pyogenes: NOT DETECTED
Streptococcus species: NOT DETECTED

## 2017-04-11 LAB — BASIC METABOLIC PANEL
Anion gap: 7 (ref 5–15)
BUN: 18 mg/dL (ref 6–20)
CALCIUM: 8.6 mg/dL — AB (ref 8.9–10.3)
CO2: 23 mmol/L (ref 22–32)
CREATININE: 1.66 mg/dL — AB (ref 0.61–1.24)
Chloride: 111 mmol/L (ref 101–111)
GFR calc Af Amer: 40 mL/min — ABNORMAL LOW (ref >60)
GFR, EST NON AFRICAN AMERICAN: 35 mL/min — AB (ref >60)
GLUCOSE: 88 mg/dL (ref 65–99)
POTASSIUM: 3.9 mmol/L (ref 3.5–5.1)
SODIUM: 141 mmol/L (ref 135–145)

## 2017-04-11 LAB — CBC
HEMATOCRIT: 23.9 % — AB (ref 39.0–52.0)
Hemoglobin: 7.6 g/dL — ABNORMAL LOW (ref 13.0–17.0)
MCH: 26.2 pg (ref 26.0–34.0)
MCHC: 31.8 g/dL (ref 30.0–36.0)
MCV: 82.4 fL (ref 78.0–100.0)
PLATELETS: 300 10*3/uL (ref 150–400)
RBC: 2.9 MIL/uL — ABNORMAL LOW (ref 4.22–5.81)
RDW: 15 % (ref 11.5–15.5)
WBC: 38.6 10*3/uL — AB (ref 4.0–10.5)

## 2017-04-11 LAB — MRSA PCR SCREENING: MRSA BY PCR: NEGATIVE

## 2017-04-11 MED ORDER — SILVER SULFADIAZINE 1 % EX CREA
TOPICAL_CREAM | Freq: Every day | CUTANEOUS | Status: DC
  Administered 2017-04-11: 1 via TOPICAL
  Administered 2017-04-12 – 2017-04-16 (×5): via TOPICAL
  Filled 2017-04-11: qty 85

## 2017-04-11 NOTE — Progress Notes (Addendum)
   Subjective:  Patient was feeling little better when seen during rounds this morning.  Objective:  Vital signs in last 24 hours: Vitals:   04/11/17 0036 04/11/17 0042 04/11/17 0439 04/11/17 1000  BP:  (!) 145/68 137/66 117/80  Pulse:  (!) 103 87 81  Resp:  18 16 16   Temp:  99.7 F (37.6 C) 99.8 F (37.7 C) 99.8 F (37.7 C)  TempSrc:    Oral  SpO2:  96% 100% 100%  Weight: 165 lb (74.8 kg)     Height: 5\' 11"  (1.803 m)      Gen. Well-developed, elderly man, in no acute distress. Lungs. Clear bilaterally. CV. Regular rate and rhythm. Abdomen. Soft, nontender, nondistended, bowel sounds positive. Extremities. Multiple wounds of different stages in both of his feet and ankle, few weaping wounds on right foot and ankle, no malodorous discharge, left foot wrapped up.  Assessment/Plan:   Vincent Miranda is a 83 y.o. gentleman with PMH gout, chronic leg wounds and osteomyelitis secondary to PVD, PAFnot on anticoagualtion, HTN, and CKD3 who presents from home when his wife found him to have BP 200/156 and HR > 100. In ED was febrile to 103.2 with leukocytosis.  Acute on chronic osteomyelitis. It is a concern of acute on chronic osteomyelitis on his right lateral malleolus. Leukocytosis trending up. Blood culture negative after 24 hour. Patient has refused surgical intervention in the past and currently. He just wanted medical intervention. -Continue cefepime and vancomycin. -Repeat CBC in the morning. -Continue wound care.  Hypertension. Patient was normotensive this morning. -Monitor blood pressure. -Continue home dose of amlodipine and Lasix.  Peripheral vascular disease. Patient has extensive peripheral vascular disease leading to bilateral lower extremity ulcerations. He does not want any surgical intervention. -Continue home dose of aspirin 81 mg daily.  Chronic lower extremity edema. Patient has an history of chronic lower extremity edema secondary to venous  stasis. -Can continue home dose of Lasix 40 mg daily.  BPH -- continue home Finasteride 5 mg daily.   Dispo: Anticipated discharge in approximately 1 day(s).   Arnetha CourserAmin, Prescilla Monger, MD 04/11/2017, 2:22 PM Pager: [phone]

## 2017-04-11 NOTE — Progress Notes (Signed)
PHARMACY - PHYSICIAN COMMUNICATION CRITICAL VALUE ALERT - BLOOD CULTURE IDENTIFICATION (BCID)  Results for orders placed or performed during the hospital encounter of 04/10/17  Blood Culture ID Panel (Reflexed) (Collected: 04/10/2017  9:04 PM)  Result Value Ref Range   Enterococcus species NOT DETECTED NOT DETECTED   Listeria monocytogenes NOT DETECTED NOT DETECTED   Staphylococcus species DETECTED (A) NOT DETECTED   Staphylococcus aureus NOT DETECTED NOT DETECTED   Methicillin resistance DETECTED (A) NOT DETECTED   Streptococcus species NOT DETECTED NOT DETECTED   Streptococcus agalactiae NOT DETECTED NOT DETECTED   Streptococcus pneumoniae NOT DETECTED NOT DETECTED   Streptococcus pyogenes NOT DETECTED NOT DETECTED   Acinetobacter baumannii NOT DETECTED NOT DETECTED   Enterobacteriaceae species NOT DETECTED NOT DETECTED   Enterobacter cloacae complex NOT DETECTED NOT DETECTED   Escherichia coli NOT DETECTED NOT DETECTED   Klebsiella oxytoca NOT DETECTED NOT DETECTED   Klebsiella pneumoniae NOT DETECTED NOT DETECTED   Proteus species NOT DETECTED NOT DETECTED   Serratia marcescens NOT DETECTED NOT DETECTED   Haemophilus influenzae NOT DETECTED NOT DETECTED   Neisseria meningitidis NOT DETECTED NOT DETECTED   Pseudomonas aeruginosa NOT DETECTED NOT DETECTED   Candida albicans NOT DETECTED NOT DETECTED   Candida glabrata NOT DETECTED NOT DETECTED   Candida krusei NOT DETECTED NOT DETECTED   Candida parapsilosis NOT DETECTED NOT DETECTED   Candida tropicalis NOT DETECTED NOT DETECTED    Name of physician (or Provider) Contacted: Sch[place]  Changes to prescribed antibiotics required: None, already on vancomycin and cefepime  Toniann Failony L Valery Amedee 04/11/2017  8:46 PM

## 2017-04-11 NOTE — Consult Note (Signed)
WOC Nurse wound consult note Reason for Consult: [facility]. He has chronic full thickness wounds and has been evaluated by the ortho service in the past who recommended amputation.  His wife is not at the bedside to assess wound appearance and discuss plan of care.  Pt states they are not interested in pursuing surgical interventions.  Wife has been performing topical treatment at home, presumably with Silvadene. Wounds improved since last admission.  However the xray of right ankle from yesterday does suggest developing osteomylitis.  Discussed suggested developing osteomyelitis on the x-ray with Dr. Cyndie ChimeGranfortuna at the bedside.  [facility], therefore I recommended ortho consult to treat that complex medical condition. Dr Cyndie ChimeGranfortuna states the patient is clear in refusing ortho's recommendation.  Wound type: Multiple full thickness wounds to BLE, all are 80% red, 20% yellow, except Right dorsal foot wound is 100% yellow slough, all with minimal amt yellow drainage, with epibole, pink dry scar tissue surrounding, no odor at this time. Measurement: Right medial lower leg proximal to ankle 3cm x 2cm x0.1cm Right anterior foot 2cm X 1.5 cm X 0.2cm  Right medial ankle 8cm X 9cm X 0.3cm Right outer foot 5cm X 23 cm X 0.2cm  Right dorsal foot  5.5cm X 3cm X 0.2cm which circles around last toe  Right plantar foot 2cm X 2 cm, 50% black eschar 50% yellow, boggy, bloody drainage  Left anterior foot, pre tibial just prox to foot 10cm x 9cm x 0cm partial thickness with   1.5cm x 2cm x 0.1cm wound at upper corner  Left medial foot  11cm X 4cm X 0.3cm Left medial ankle 1.5cm 1cm x 0.1cm (where first two toes were)  Dressing procedure/placement/frequency: Copntinue present plan of care with Silvadene daily to provide enzymatic debridement of nonviable tissue and promote healing.  Wife is well-informed on topical treatment and can resume dressing  changes after discharge. Was unable to reach her this morning to discuss.   We will not follow, but will remain available to this patient, to nursing, and the medical and/or surgical teams.  Please re-consult if we need to assist further.    Barnett HatterMelinda Piper [place], RN-C, WTA-C Wound Treatment Associate

## 2017-04-11 NOTE — Progress Notes (Signed)
New Admission Note: Pt admitted [facility] 6E22 [facility]  Arrival Method: via stretcher Mental Orientation: Alert and oriented x 4 Telemetry: N/A Assessment: Completed Skin: Venous stasis ulcers to R and L feet /LE's - WOC ordered IV: L hand Pain: Denies Tubes: None Safety Measures: Safety Fall Prevention Plan has been discussed  Admission: To be completed [place] Orientation: Patient has been orientated to the room, unit and staff.  Family: Spouse and son at bedside  Orders to be reviewed and implemented. Will continue to monitor the patient. Call light has been placed within reach and bed alarm has been activated.   Burley SaverKami Linnaea Ahn, BSN, RN-BC Phone: [phone]

## 2017-04-11 NOTE — ED Notes (Signed)
Attempted to call report

## 2017-04-12 DIAGNOSIS — R6520 Severe sepsis without septic shock: Secondary | ICD-10-CM

## 2017-04-12 DIAGNOSIS — B9562 Methicillin resistant Staphylococcus aureus infection as the cause of diseases classified elsewhere: Secondary | ICD-10-CM

## 2017-04-12 DIAGNOSIS — A4102 Sepsis due to Methicillin resistant Staphylococcus aureus: Principal | ICD-10-CM

## 2017-04-12 LAB — URINE CULTURE: Culture: NO GROWTH

## 2017-04-12 LAB — CULTURE, BLOOD (ROUTINE X 2): SPECIAL REQUESTS: ADEQUATE

## 2017-04-12 LAB — CBC
HCT: 22.7 % — ABNORMAL LOW (ref 39.0–52.0)
HEMOGLOBIN: 7.2 g/dL — AB (ref 13.0–17.0)
MCH: 26.3 pg (ref 26.0–34.0)
MCHC: 31.7 g/dL (ref 30.0–36.0)
MCV: 82.8 fL (ref 78.0–100.0)
Platelets: 270 10*3/uL (ref 150–400)
RBC: 2.74 MIL/uL — ABNORMAL LOW (ref 4.22–5.81)
RDW: 15.2 % (ref 11.5–15.5)
WBC: 20.9 10*3/uL — ABNORMAL HIGH (ref 4.0–10.5)

## 2017-04-12 LAB — BASIC METABOLIC PANEL
Anion gap: 6 (ref 5–15)
BUN: 19 mg/dL (ref 6–20)
CALCIUM: 9.1 mg/dL (ref 8.9–10.3)
CHLORIDE: 111 mmol/L (ref 101–111)
CO2: 24 mmol/L (ref 22–32)
CREATININE: 1.72 mg/dL — AB (ref 0.61–1.24)
GFR calc Af Amer: 39 mL/min — ABNORMAL LOW (ref >60)
GFR calc non Af Amer: 33 mL/min — ABNORMAL LOW (ref >60)
Glucose, Bld: 86 mg/dL (ref 65–99)
Potassium: 3.4 mmol/L — ABNORMAL LOW (ref 3.5–5.1)
SODIUM: 141 mmol/L (ref 135–145)

## 2017-04-12 MED ORDER — DEXTROSE 5 % IV SOLN
1.0000 g | INTRAVENOUS | Status: DC
  Administered 2017-04-13 – 2017-04-16 (×4): 1 g via INTRAVENOUS
  Filled 2017-04-12 (×4): qty 1

## 2017-04-12 MED ORDER — SODIUM CHLORIDE 0.9 % IV SOLN
INTRAVENOUS | Status: DC
  Administered 2017-04-12: 13:00:00 via INTRAVENOUS

## 2017-04-12 MED ORDER — POTASSIUM CHLORIDE CRYS ER 20 MEQ PO TBCR
20.0000 meq | EXTENDED_RELEASE_TABLET | Freq: Two times a day (BID) | ORAL | Status: AC
  Administered 2017-04-12 (×2): 20 meq via ORAL
  Filled 2017-04-12 (×2): qty 1

## 2017-04-12 NOTE — Progress Notes (Addendum)
   Subjective: Patient was feeling better when seen this morning. He has no complaints.                     Objective:  Vital signs in last 24 hours: Vitals:   04/11/17 1700 04/11/17 2042 04/12/17 0443 04/12/17 0907  BP: 136/74 (!) 126/58 (!) 147/64 (!) 152/63  Pulse: 85 80 78 80  Resp: 16 15 16 16   Temp: 99.1 F (37.3 C) 98.8 F (37.1 C) 98.4 F (36.9 C) 97.7 F (36.5 C)  TempSrc: Oral   Oral  SpO2: 100% 100% 100% 100%  Weight:  163 lb 2.3 oz (74 kg)    Height:       Gen. Well-developed, chronically ill-looking elderly man, in no acute distress. Lungs. Few basilar rhonchi bilaterally. CV. Regular rate and rhythm. Abdomen. Soft, nontender, nondistended, bowel sounds positive. Extremities. Multiple wounds of different stages in both of his feet and ankle, wrapped with bandage with some serosanguineous discharge seen on right lower extremity.  Labs. CBC Latest Ref Rng & Units 04/12/2017 04/11/2017 04/10/2017  WBC 4.0 - 10.5 K/uL 20.9(H) 38.6(H) 27.2(H)  Hemoglobin 13.0 - 17.0 g/dL 7.2(L) 7.6(L) 8.9(L)  Hematocrit 39.0 - 52.0 % 22.7(L) 23.9(L) 27.8(L)  Platelets 150 - 400 K/uL 270 300 356   BMP Latest Ref Rng & Units 04/12/2017 04/11/2017 04/10/2017  Glucose 65 - 99 mg/dL 86 88 161(W112(H)  BUN 6 - 20 mg/dL 19 18 18   Creatinine 0.61 - 1.24 mg/dL 9.60(A1.72(H) 5.40(J1.66(H) 8.11(B1.84(H)  BUN/Creat Ratio 10 - 24 - - -  Sodium 135 - 145 mmol/L 141 141 139  Potassium 3.5 - 5.1 mmol/L 3.4(L) 3.9 3.9  Chloride 101 - 111 mmol/L 111 111 109  CO2 22 - 32 mmol/L 24 23 24   Calcium 8.9 - 10.3 mg/dL 9.1 1.4(N8.6(L) 9.4    Assessment/Plan:  Vincent Maduroobert T Watlingtonis a 83 y.o.gentlemanwith PMH gout, chronic leg wounds and osteomyelitissecondary to PVD,PAFnot on anticoagualtion, HTN, and CKD3 who presents from home when his wife found him to have BP 200/156 and HR > 100. In ED was febrile to 103.2 with leukocytosis.  MRSE bacteremia. His blood cultures are growing MRSE. In 1 out of 2 bottles, might be a contaminant  .He  remained mostly afebrile with maximum temperature of 99.8 yesterday. His leukocytosis is improving at is down to 20.9 today, from 38.6 yesterday. -Continue vancomycin.and cefepime.  Acute on chronic osteomyelitis. His blood cultures are growing MRSE, in 1 out of 2 bottles, his chronic osteomyelitis might be polymicrobial. We will continue with vancomycin and  cefepime. -Continue wound care.  CKD Stage III. Seconds 2014 his creatinine stays between 1.4-1.9. There was mild elevation of creatinine today to 1.72 from 1.66 yesterday. He is requiring vancomycin for his MRSA bacteremia and osteomyelitis. -Give him IV fluid. -Monitor renal functions.  Hypertension.  Blood pressure mildly elevated today. -Monitor blood pressure. -Continue home dose of amlodipine and Lasix.  Peripheral vascular disease. Patient has extensive peripheral vascular disease leading to bilateral lower extremity ulcerations. He does not want any surgical intervention. -Continue home dose of aspirin 81 mg daily.  Chronic lower extremity edema. Patient has an history of chronic lower extremity edema secondary to venous stasis. -Can continue home dose of Lasix 40 mg daily.  BPH -- continue home Finasteride 5 mg daily.  Dispo: Anticipated discharge in approximately 1-2 day(s).   Arnetha CourserAmin, Marsena Taff, MD 04/12/2017, 11:09 AM Pager: [phone]

## 2017-04-12 NOTE — Progress Notes (Signed)
Medicine attending: I examined this patient today and I concur with the evaluation and management plan as recorded by resident physician Greig Rightr.Sumayya [name] which we discussed together. Blood cultures already growing MRSA.  Already on vancomycin based regimen.  CKD 3.  Continue to monitor renal function on the vancomycin.  Creatinine 1.7. He does not appear toxic.  Exam is stable.  He is now afebrile and responding to antibiotics. Impression: 1.  Chronic osteomyelitis bilateral lower extremities consequence of peripheral vascular disease 2.  MRSA septicemia 3.  CKD 3 Continue current management.

## 2017-04-13 ENCOUNTER — Encounter (HOSPITAL_COMMUNITY): Payer: Self-pay

## 2017-04-13 DIAGNOSIS — L97919 Non-pressure chronic ulcer of unspecified part of right lower leg with unspecified severity: Secondary | ICD-10-CM

## 2017-04-13 DIAGNOSIS — A411 Sepsis due to other specified staphylococcus: Secondary | ICD-10-CM

## 2017-04-13 DIAGNOSIS — L97929 Non-pressure chronic ulcer of unspecified part of left lower leg with unspecified severity: Secondary | ICD-10-CM

## 2017-04-13 LAB — CBC
HEMATOCRIT: 25 % — AB (ref 39.0–52.0)
Hemoglobin: 7.9 g/dL — ABNORMAL LOW (ref 13.0–17.0)
MCH: 26.2 pg (ref 26.0–34.0)
MCHC: 31.6 g/dL (ref 30.0–36.0)
MCV: 83.1 fL (ref 78.0–100.0)
PLATELETS: 286 10*3/uL (ref 150–400)
RBC: 3.01 MIL/uL — ABNORMAL LOW (ref 4.22–5.81)
RDW: 15.1 % (ref 11.5–15.5)
WBC: 15.8 10*3/uL — ABNORMAL HIGH (ref 4.0–10.5)

## 2017-04-13 LAB — BASIC METABOLIC PANEL
ANION GAP: 5 (ref 5–15)
BUN: 21 mg/dL — AB (ref 6–20)
CALCIUM: 9.2 mg/dL (ref 8.9–10.3)
CO2: 25 mmol/L (ref 22–32)
CREATININE: 1.62 mg/dL — AB (ref 0.61–1.24)
Chloride: 112 mmol/L — ABNORMAL HIGH (ref 101–111)
GFR calc Af Amer: 41 mL/min — ABNORMAL LOW (ref >60)
GFR calc non Af Amer: 36 mL/min — ABNORMAL LOW (ref >60)
GLUCOSE: 102 mg/dL — AB (ref 65–99)
Potassium: 4 mmol/L (ref 3.5–5.1)
Sodium: 142 mmol/L (ref 135–145)

## 2017-04-13 LAB — VANCOMYCIN, TROUGH: Vancomycin Tr: 20 ug/mL (ref 15–20)

## 2017-04-13 NOTE — Care Management Important Message (Signed)
Important Message  Patient Details  Name: Vincent PigeonRobert T Miranda MRN: [id number] Date of Birth: 05/27/1925   Medicare Important Message Given:  Yes    Jaedyn Marrufo, Annamarie Majorheryl U, RN 04/13/2017, 12:11 PM

## 2017-04-13 NOTE — Progress Notes (Signed)
   Subjective: Patient was feeling better. He has no complaints. Wife was at bedside stating that he is back to his baseline.  Objective:  Vital signs in last 24 hours: Vitals:   04/12/17 1858 04/12/17 2237 04/13/17 0550 04/13/17 0851  BP: (!) 161/81 (!) 173/79 (!) 163/81 (!) 169/80  Pulse: 85 81 83 79  Resp: 16 17 18 18   Temp: 98.6 F (37 C) 98.4 F (36.9 C) 97.6 F (36.4 C) 97.7 F (36.5 C)  TempSrc: Oral   Oral  SpO2: 99% 100% 91% 100%  Weight:   161 lb (73 kg)   Height:       Gen. Well-developed elderly man, in no acute distress. Lungs. Few basilar rhonchi bilaterally along with some scattered wheeze. CV.Regular rate and rhythm. Abdomen.Soft, nontender, nondistended, bowel sounds positive. Extremities.Multiple wounds of different stages in both of his feet and ankle, wrapped with bandage.  Labs. CBC Latest Ref Rng & Units 04/13/2017 04/12/2017 04/11/2017  WBC 4.0 - 10.5 K/uL 15.8(H) 20.9(H) 38.6(H)  Hemoglobin 13.0 - 17.0 g/dL 7.9(L) 7.2(L) 7.6(L)  Hematocrit 39.0 - 52.0 % 25.0(L) 22.7(L) 23.9(L)  Platelets 150 - 400 K/uL 286 270 300   BMP Latest Ref Rng & Units 04/13/2017 04/12/2017 04/11/2017  Glucose 65 - 99 mg/dL 161(W102(H) 86 88  BUN 6 - 20 mg/dL 96(E21(H) 19 18  Creatinine 0.61 - 1.24 mg/dL 4.54(U1.62(H) 9.81(X1.72(H) 9.14(N1.66(H)  BUN/Creat Ratio 10 - 24 - - -  Sodium 135 - 145 mmol/L 142 141 141  Potassium 3.5 - 5.1 mmol/L 4.0 3.4(L) 3.9  Chloride 101 - 111 mmol/L 112(H) 111 111  CO2 22 - 32 mmol/L 25 24 23   Calcium 8.9 - 10.3 mg/dL 9.2 9.1 8.2(N8.6(L)    Assessment/Plan:  Vincent Bihariobert T Watlingtonis a 83 y.o.gentlemanwith PMH gout, chronic leg wounds and osteomyelitissecondary to PVD,PAFnot on anticoagualtion, HTN, and CKD3 who presents from home when his wife found him to have BP 200/156 and HR > 100.In ED was febrile to 103.2 with leukocytosis.  MRSE bacteremia/ Acute on chronic osteomyelitis. His blood cultures are growing MRSE. In 1 out of 2 bottles, might be a contaminant. He  remained afebrile and leukocytosis is improving. -We will continue IV cefepime and vancomycin for one week-today's day 4, later can be converted to doxycycline and Augmentin for 4 weeks.  CKD Stage III. Creatinine improved and in his baseline now after giving him some IV fluids yesterday. -Monitor renal function, as well as vancomycin can be nephrotoxic.  Hypertension. Blood pressure elevated today, most likely due to IV normal saline. -D/C normal saline. -If remains elevated tomorrow we can increase his home dose of amlodipine. -We will continue his current dose of amlodipine and Lasix for now.  Peripheral vascular disease. Patient has extensive peripheral vascular disease leading to bilateral lower extremity ulcerations. He does not want any surgical intervention. -Continue home dose of aspirin 81 mg daily.  Chronic lower extremity edema.Patient has an history of chronic lower extremity edema secondary to venous stasis. -Can continue home dose of Lasix 40 mg daily.  BPH -- continue home Finasteride 5 mg daily.    Dispo: Anticipated discharge in approximately 2-3 day(s).   Arnetha CourserAmin, Trashaun Streight, MD 04/13/2017, 12:50 PM Pager: [phone]

## 2017-04-13 NOTE — Progress Notes (Signed)
Pharmacy Antibiotic Note Vincent PigeonRobert T Miranda is a 83 y.o. male admitted on 04/10/2017 with concern for osteomyelitis, also with 1/2 BCx with MRSE (CONS) - possible contaminant?  Pharmacy has been consulted for and Cefepime and vancomycin dosing - day #4. Afebrile, WBC trend down 15.8, lactic acid trend down. SCr down 1.62, CrCl~31. Pt has completed multiple long-term courses of doxycycline.   Plan: Cefepime 1g IV q24h - borderline for dose increase, f/u SCr trend tomorrow Vancomycin 750mg  IV q24h  F/u renal fxn, C&S, clinical status, and vancomycin trough at SS (5/25 PM)  Height: 5\' 11"  (180.3 cm) Weight: 161 lb (73 kg) IBW/kg (Calculated) : 75.3  Temp (24hrs), Avg:98.1 F (36.7 C), Min:97.6 F (36.4 C), Max:98.6 F (37 C)   Recent Labs Lab 04/10/17 2100 04/10/17 2122 04/10/17 2310 04/11/17 0546 04/12/17 0431 04/13/17 0809  WBC 27.2*  --   --  38.6* 20.9* 15.8*  CREATININE 1.84*  --   --  1.66* 1.72* 1.62*  LATICACIDVEN  --  2.43* 1.26  --   --   --     Estimated Creatinine Clearance: 31.3 mL/min (A) (by C-G formula based on SCr of 1.62 mg/dL (H)).    No Known Allergies  Antimicrobials this admission:  Vancomycin 5/22>>  Cefepime 5/23 >>  Zosyn x 1 5/22  Dose adjustments this admission: N/A  Microbiology results: 5/22 BCx2: 1/2 MRSE (BCID Staph species, MR+) 5/22 UCx: neg 5/23 MRSA PCR: neg   Vincent BertinHaley Anyia Miranda, PharmD, BCPS Clinical Pharmacist Rx Phone # for today: [phone] After 3:30PM, please call Main Rx: #[id number] 04/13/2017 10:45 AM

## 2017-04-13 NOTE — Progress Notes (Signed)
Medicine attending: I examined this patient today and I concur with the evaluation and management plan as recorded by resident physician Greig Rightr.Sumayya [name] which we discussed together. Clinically stable.  Afebrile.  Exam stable.  Some bilateral wheezes over the lung fields.  White blood count trending down.  Renal function stable at his baseline. Day 3 vancomycin, Cefipime. Wife present.  Status and plan reviewed. Impression: 1.  Chronic bilateral lower extremity ischemic ulcers secondary to peripheral vascular disease. 2.  Chronic osteomyelitis 3.  1 of 2 cultures positive for methicillin-resistant coagulase-negative staph.  Although this may be a contaminant, he was clinically septic and has a source of infection from chronic osteomyelitis and chronic leg ulcerations.  He is 83 years old.  I think he needs a minimum of 5-7 days of parenteral antibiotics before he can safely be switched to oral. 4.  CKD 3

## 2017-04-14 LAB — CBC
HCT: 24.2 % — ABNORMAL LOW (ref 39.0–52.0)
Hemoglobin: 7.4 g/dL — ABNORMAL LOW (ref 13.0–17.0)
MCH: 25.7 pg — ABNORMAL LOW (ref 26.0–34.0)
MCHC: 30.6 g/dL (ref 30.0–36.0)
MCV: 84 fL (ref 78.0–100.0)
PLATELETS: 262 10*3/uL (ref 150–400)
RBC: 2.88 MIL/uL — ABNORMAL LOW (ref 4.22–5.81)
RDW: 15 % (ref 11.5–15.5)
WBC: 10 10*3/uL (ref 4.0–10.5)

## 2017-04-14 LAB — BASIC METABOLIC PANEL
ANION GAP: 6 (ref 5–15)
BUN: 20 mg/dL (ref 6–20)
CALCIUM: 9 mg/dL (ref 8.9–10.3)
CO2: 27 mmol/L (ref 22–32)
Chloride: 110 mmol/L (ref 101–111)
Creatinine, Ser: 1.38 mg/dL — ABNORMAL HIGH (ref 0.61–1.24)
GFR, EST AFRICAN AMERICAN: 50 mL/min — AB (ref >60)
GFR, EST NON AFRICAN AMERICAN: 43 mL/min — AB (ref >60)
Glucose, Bld: 132 mg/dL — ABNORMAL HIGH (ref 65–99)
Potassium: 3.4 mmol/L — ABNORMAL LOW (ref 3.5–5.1)
SODIUM: 143 mmol/L (ref 135–145)

## 2017-04-14 MED ORDER — IPRATROPIUM-ALBUTEROL 0.5-2.5 (3) MG/3ML IN SOLN
3.0000 mL | Freq: Four times a day (QID) | RESPIRATORY_TRACT | Status: DC
  Administered 2017-04-14: 3 mL via RESPIRATORY_TRACT
  Filled 2017-04-14: qty 3

## 2017-04-14 MED ORDER — ALBUTEROL SULFATE (2.5 MG/3ML) 0.083% IN NEBU
2.5000 mg | INHALATION_SOLUTION | Freq: Four times a day (QID) | RESPIRATORY_TRACT | Status: DC
  Administered 2017-04-14: 2.5 mg via RESPIRATORY_TRACT
  Filled 2017-04-14: qty 3

## 2017-04-14 MED ORDER — POTASSIUM CHLORIDE CRYS ER 20 MEQ PO TBCR
40.0000 meq | EXTENDED_RELEASE_TABLET | Freq: Once | ORAL | Status: AC
Start: 2017-04-14 — End: 2017-04-14
  Administered 2017-04-14: 40 meq via ORAL
  Filled 2017-04-14: qty 2

## 2017-04-14 MED ORDER — IPRATROPIUM-ALBUTEROL 0.5-2.5 (3) MG/3ML IN SOLN: 3.0000 mL | Freq: Four times a day (QID) | RESPIRATORY_TRACT | Status: DC | PRN

## 2017-04-14 MED ORDER — IPRATROPIUM-ALBUTEROL 0.5-2.5 (3) MG/3ML IN SOLN: 3.0000 mL | RESPIRATORY_TRACT | Status: DC | PRN

## 2017-04-14 NOTE — Progress Notes (Addendum)
   Subjective: Patient is feeling well and has no complaints.  Objective:  Vital signs in last 24 hours: Vitals:   04/13/17 1658 04/13/17 2028 04/14/17 0607 04/14/17 1025  BP: (!) 163/75 (!) 150/67 (!) 161/65 (!) 154/84  Pulse: 73 78 86 79  Resp: 20 18 18 17   Temp: 98.1 F (36.7 C) 98.6 F (37 C) 98.1 F (36.7 C) 97.6 F (36.4 C)  TempSrc: Oral Oral Oral Oral  SpO2: 100% 99% 96% 100%  Weight:      Height:       Gen. Well-developed elderly man, in no acute distress. Lungs. End expiratory wheezing. No rales or rhonchi.  CV.Regular rate and rhythm. S1 and S2 appreciated.  Abdomen.Soft, nontender, nondistended, bowel sounds positive. Extremities.Multiple wounds of different stages in both of his feet and ankle, wrapped with bandage.  Labs. CBC Latest Ref Rng & Units 04/13/2017 04/12/2017 04/11/2017  WBC 4.0 - 10.5 K/uL 15.8(H) 20.9(H) 38.6(H)  Hemoglobin 13.0 - 17.0 g/dL 7.9(L) 7.2(L) 7.6(L)  Hematocrit 39.0 - 52.0 % 25.0(L) 22.7(L) 23.9(L)  Platelets 150 - 400 K/uL 286 270 300   BMP Latest Ref Rng & Units 04/13/2017 04/12/2017 04/11/2017  Glucose 65 - 99 mg/dL 161(W102(H) 86 88  BUN 6 - 20 mg/dL 96(E21(H) 19 18  Creatinine 0.61 - 1.24 mg/dL 4.54(U1.62(H) 9.81(X1.72(H) 9.14(N1.66(H)  BUN/Creat Ratio 10 - 24 - - -  Sodium 135 - 145 mmol/L 142 141 141  Potassium 3.5 - 5.1 mmol/L 4.0 3.4(L) 3.9  Chloride 101 - 111 mmol/L 112(H) 111 111  CO2 22 - 32 mmol/L 25 24 23   Calcium 8.9 - 10.3 mg/dL 9.2 9.1 8.2(N8.6(L)    Assessment/Plan:  Vincent Bihariobert T Watlingtonis a 83 y.o.gentlemanwith PMH gout, chronic leg wounds and osteomyelitissecondary to PVD,PAFnot on anticoagualtion, HTN, and CKD3 who presents from home when his wife found him to have BP 200/156 and HR > 100.In ED was febrile to 103.2 with leukocytosis.  MRSE bacteremia/ Acute on chronic osteomyelitis. His blood cultures are growing MRSE. In 1 out of 2 bottles, might be a contaminant. White count trended down yesterday. He has remained afebrile. -We will  continue IV cefepime and vancomycin for one week-today's day 5, later can be converted to doxycycline and Augmentin for 4 weeks. -CBC pending   CKD Stage III. Creatinine improved and back to baseline s/p IVF.  -Continue to monitor renal function as vancomycin can be nephrotoxic. BMP pending.   Hypertension. Blood pressure improved today, systolic in 150s and diastolic in 80s.  -We will continue his current dose of amlodipine and Lasix for now.  Wheezing on exam: No history of asthma or COPD. Does report a history of smoking 40+ years in the past.  -Duonebs q6  Peripheral vascular disease. Patient has extensive peripheral vascular disease leading to bilateral lower extremity ulcerations. He does not want any surgical intervention. -Continue home dose of aspirin 81 mg daily.  Chronic lower extremity edema.Patient has an history of chronic lower extremity edema secondary to venous stasis. -Can continue home dose of Lasix 40 mg daily.  BPH -- continue home Finasteride 5 mg daily.    Dispo: Anticipated discharge in approximately 2-3 day(s).   John Giovanniathore, Ragena Fiola, MD 04/14/2017, 2:06 PM Pager: [phone]

## 2017-04-14 NOTE — Progress Notes (Signed)
Pharmacy Antibiotic Note Vincent PigeonRobert T Blecher is a 83 y.o. male admitted on 04/10/2017 with concern for osteomyelitis, also with 1/2 BCx with MRSE (CONS) - possible contaminant?  Pharmacy has been consulted for and Cefepime and vancomycin dosing - day #4. Afebrile, WBC trend down 15.8, lactic acid trend down. SCr down 1.62, CrCl~31. Pt has completed multiple long-term courses of doxycycline.   Vancomycin trough this evening is therapeutic at 20 based on indication of osteomyelitis.   Plan: 1. Cefepime 1g IV q24h 2. Continue Vancomycin 750mg  IV q24h  3. If vancomycin continued will plan for weekly vancomycin trough unless clinical course dictates otherwise   Height: 5\' 11"  (180.3 cm) Weight: 161 lb (73 kg) IBW/kg (Calculated) : 75.3  Temp (24hrs), Avg:98 F (36.7 C), Min:97.6 F (36.4 C), Max:98.6 F (37 C)   Recent Labs Lab 04/10/17 2100 04/10/17 2122 04/10/17 2310 04/11/17 0546 04/12/17 0431 04/13/17 0809 04/13/17 2221  WBC 27.2*  --   --  38.6* 20.9* 15.8*  --   CREATININE 1.84*  --   --  1.66* 1.72* 1.62*  --   LATICACIDVEN  --  2.43* 1.26  --   --   --   --   VANCOTROUGH  --   --   --   --   --   --  20    Estimated Creatinine Clearance: 31.3 mL/min (A) (by C-G formula based on SCr of 1.62 mg/dL (H)).    No Known Allergies  Antimicrobials this admission:  Vancomycin 5/22>>  Cefepime 5/23 >>  Zosyn x 1 5/22  Dose adjustments this admission: N/A  Microbiology results: 5/22 BCx2: 1/2 MRSE (BCID Staph species, MR+) 5/22 UCx: neg 5/23 MRSA PCR: neg   Pollyann SamplesAndy Ambur Province, PharmD, BCPS 04/14/2017, 12:42 AM

## 2017-04-15 DIAGNOSIS — I878 Other specified disorders of veins: Secondary | ICD-10-CM

## 2017-04-15 DIAGNOSIS — Z7982 Long term (current) use of aspirin: Secondary | ICD-10-CM

## 2017-04-15 DIAGNOSIS — I480 Paroxysmal atrial fibrillation: Secondary | ICD-10-CM

## 2017-04-15 DIAGNOSIS — N400 Enlarged prostate without lower urinary tract symptoms: Secondary | ICD-10-CM

## 2017-04-15 DIAGNOSIS — M8668 Other chronic osteomyelitis, other site: Secondary | ICD-10-CM

## 2017-04-15 DIAGNOSIS — Z960 Presence of urogenital implants: Secondary | ICD-10-CM

## 2017-04-15 DIAGNOSIS — Z79899 Other long term (current) drug therapy: Secondary | ICD-10-CM

## 2017-04-15 DIAGNOSIS — I129 Hypertensive chronic kidney disease with stage 1 through stage 4 chronic kidney disease, or unspecified chronic kidney disease: Secondary | ICD-10-CM

## 2017-04-15 DIAGNOSIS — I739 Peripheral vascular disease, unspecified: Secondary | ICD-10-CM

## 2017-04-15 DIAGNOSIS — R7881 Bacteremia: Secondary | ICD-10-CM

## 2017-04-15 DIAGNOSIS — N183 Chronic kidney disease, stage 3 (moderate): Secondary | ICD-10-CM

## 2017-04-15 DIAGNOSIS — M8618 Other acute osteomyelitis, other site: Secondary | ICD-10-CM

## 2017-04-15 LAB — CULTURE, BLOOD (ROUTINE X 2)
CULTURE: NO GROWTH
Special Requests: ADEQUATE

## 2017-04-15 LAB — BASIC METABOLIC PANEL
Anion gap: 6 (ref 5–15)
BUN: 20 mg/dL (ref 6–20)
CALCIUM: 9.2 mg/dL (ref 8.9–10.3)
CO2: 27 mmol/L (ref 22–32)
CREATININE: 1.49 mg/dL — AB (ref 0.61–1.24)
Chloride: 111 mmol/L (ref 101–111)
GFR calc Af Amer: 46 mL/min — ABNORMAL LOW (ref >60)
GFR calc non Af Amer: 40 mL/min — ABNORMAL LOW (ref >60)
Glucose, Bld: 79 mg/dL (ref 65–99)
Potassium: 3.6 mmol/L (ref 3.5–5.1)
Sodium: 144 mmol/L (ref 135–145)

## 2017-04-15 NOTE — Progress Notes (Signed)
Subjective: Patient reports having trouble emptying his bladder completely and having suprapubic discomfort. He does have a history of BPH. States he is eating all his meals. Reports having a bowel movement yesterday. No other complaints.   Objective:  Vital signs in last 24 hours: Vitals:   04/14/17 1833 04/14/17 2138 04/14/17 2154 04/15/17 0523  BP: (!) 155/66  136/64 137/67  Pulse: 81  73 68  Resp: 17  17 16   Temp: 99 F (37.2 C)  98.6 F (37 C) 98 F (36.7 C)  TempSrc: Oral  Oral Oral  SpO2: 97% 98% 98% 93%  Weight:      Height:       Gen. Well-developed elderly man, in no acute distress. Lungs. Lungs CTAB. No wheezes, rales, or rhonchi.  CV.Regular rate and rhythm. S1 and S2 appreciated.  Abdomen.Soft, nontender, nondistended, bowel sounds positive. GU. Condom catheter in place, urine present in collection bag.  Extremities.Multiple wounds of different stages in both of his feet and ankle, wrapped with bandage.  Labs. CBC Latest Ref Rng & Units 04/14/2017 04/13/2017 04/12/2017  WBC 4.0 - 10.5 K/uL 10.0 15.8(H) 20.9(H)  Hemoglobin 13.0 - 17.0 g/dL 7.4(L) 7.9(L) 7.2(L)  Hematocrit 39.0 - 52.0 % 24.2(L) 25.0(L) 22.7(L)  Platelets 150 - 400 K/uL 262 286 270   BMP Latest Ref Rng & Units 04/14/2017 04/13/2017 04/12/2017  Glucose 65 - 99 mg/dL 119(J132(H) 478(G102(H) 86  BUN 6 - 20 mg/dL 20 95(A21(H) 19  Creatinine 0.61 - 1.24 mg/dL 2.13(Y1.38(H) 8.65(H1.62(H) 8.46(N1.72(H)  BUN/Creat Ratio 10 - 24 - - -  Sodium 135 - 145 mmol/L 143 142 141  Potassium 3.5 - 5.1 mmol/L 3.4(L) 4.0 3.4(L)  Chloride 101 - 111 mmol/L 110 112(H) 111  CO2 22 - 32 mmol/L 27 25 24   Calcium 8.9 - 10.3 mg/dL 9.0 9.2 9.1    Assessment/Plan:  Vincent BihariRobert T Watlingtonis a 83 y.o.gentlemanwith PMH gout, chronic leg wounds and osteomyelitissecondary to PVD,PAFnot on anticoagualtion, HTN, and CKD3 who presents from home when his wife found him to have BP 200/156 and HR > 100.In ED was febrile to 103.2 with leukocytosis.  MRSE  bacteremia/ Acute on chronic osteomyelitis. His blood cultures are growing MRSE. In 1 out of 2 bottles, might be a contaminant. Leukocytosis resolved and he continues to be afebrile. -We will continue IV cefepime and vancomycin for one week - today's day 6, later can be converted to doxycycline and Augmentin for 4 weeks.   CKD Stage III. Creatinine improved and back to baseline s/p IVF.  -Continue to monitor renal function as vancomycin can be nephrotoxic.   Hypertension. Blood pressure improved today, systolic in 130s and diastolic in 60s.  -Will continue his current dose of amlodipine and Lasix  Peripheral vascular disease. Patient has extensive peripheral vascular disease leading to bilateral lower extremity ulcerations. He does not want any surgical intervention. -Continue home dose of aspirin 81 mg daily.  Chronic lower extremity edema.Patient has an history of chronic lower extremity edema secondary to venous stasis. -Can continue home dose of Lasix 40 mg daily.  BPH - Pt complained of trouble emptying his bladder and suprapubic discomfort this morning. Abdominal exam benign; no suprapubic tenderness. He remains afebrile; no systemic signs of infection. He has a condom cath in place and urine was noted in the collection bag. Recorded urine output is 2.2L for the past 24 hrs.  - continue to monitor urine output  - continue home Finasteride 5 mg daily  Dispo: Anticipated discharge in  approximately 1-2 day(s).   Vincent Giovanni, MD 04/15/2017, 9:14 AM Pager: [phone]

## 2017-04-15 NOTE — Progress Notes (Signed)
  Date: 04/15/2017  Patient name: Vincent Miranda  Medical record number: [id number]  Date of birth: May 16, 1925   I have seen and evaluated this patient and I have discussed the plan of care with the house staff. Please see their note for complete details. I concur with their findings with the following additions/corrections: Vincent Miranda was seen on morning rounds. He complained of some suprapubic discomfort and the sensation that he needed to void. He has a condom catheter but the bag was not lower than the bladder and I was able to drain urine by lowering it. Will complete IV antibiotics tomorrow and then can be discharged to complete home oral antibiotics.  Burns SpainButcher, Gretel Cantu A, MD 04/15/2017, 9:28 AM

## 2017-04-16 MED ORDER — DOXYCYCLINE HYCLATE 100 MG PO TABS
100.0000 mg | ORAL_TABLET | Freq: Two times a day (BID) | ORAL | Status: DC
  Administered 2017-04-16: 100 mg via ORAL
  Filled 2017-04-16: qty 1

## 2017-04-16 MED ORDER — AMLODIPINE BESYLATE 5 MG PO TABS: 7.5000 mg | ORAL_TABLET | Freq: Every day | ORAL | Status: DC

## 2017-04-16 MED ORDER — AMLODIPINE BESYLATE 2.5 MG PO TABS: 7.5000 mg | ORAL_TABLET | Freq: Every day | ORAL | 0 refills | Status: DC

## 2017-04-16 MED ORDER — AMOXICILLIN-POT CLAVULANATE 500-125 MG PO TABS
1.0000 | ORAL_TABLET | Freq: Two times a day (BID) | ORAL | Status: DC
  Administered 2017-04-16: 500 mg via ORAL
  Filled 2017-04-16: qty 1

## 2017-04-16 MED ORDER — DOXYCYCLINE HYCLATE 100 MG PO TABS: 100.0000 mg | ORAL_TABLET | Freq: Two times a day (BID) | ORAL | 0 refills | Status: AC

## 2017-04-16 MED ORDER — AMOXICILLIN-POT CLAVULANATE 500-125 MG PO TABS: 1.0000 | ORAL_TABLET | Freq: Two times a day (BID) | ORAL | 0 refills | Status: AC

## 2017-04-16 NOTE — Progress Notes (Signed)
   Subjective: Patient was feeling better. He wants to go home.   Objective:  Vital signs in last 24 hours: Vitals:   04/15/17 0523 04/15/17 1004 04/15/17 2235 04/16/17 1003  BP: 137/67 (!) 148/64 (!) 164/78 (!) 164/85  Pulse: 68 74 66 84  Resp: 16 16 16 16   Temp: 98 F (36.7 C) 98.5 F (36.9 C) 98.2 F (36.8 C) 98 F (36.7 C)  TempSrc: Oral Oral Oral Oral  SpO2: 93% 97% 96% 96%  Weight:   152 lb (68.9 kg)   Height:       Gen.Well-developed elderly man, in no acute distress. Lungs.Clear bilaterally. CV.Regular rate and rhythm. Abdomen.Soft, nontender, nondistended, bowel sounds positive. Extremities. Clean bandage covering both feet and ankle.  Assessment/Plan:  Vincent BihariRobert T Watlingtonis a 83 y.o.gentlemanwith PMH gout, chronic leg wounds and osteomyelitissecondary to PVD,PAFnot on anticoagualtion, HTN, and CKD3 who presents from home when his wife found him to have BP 200/156 and HR > 100.In ED was febrile to 103.2 with leukocytosis.  MRSEbacteremia/Acute on chronic osteomyelitis. He has completed 7 days of IV cefepime and vancomycin today. Can be discharged home on doxycycline and Augmentin for 5 more weeks. He will need a close follow-up to reassess his kidney functions regularly. His antibiotic dosage can be titrated according to his kidney functions if needed.  CKD Stage III. Creatinine at baseline. He will need a regular monitoring of his kidney functions on discharge.  Hypertension. Blood pressures remained elevated with systolic in 160s. -Increase his home dose of amlodipine to 7.5 mg daily. -Continue home dose of Lasix 40 mg daily.  Peripheral vascular disease. Patient has extensive peripheral vascular disease leading to bilateral lower extremity ulcerations. He does not want any surgical intervention. -Continue home dose of aspirin 81 mg daily.  Chronic lower extremity edema.Patient has an history of chronic lower extremity edema secondary to venous  stasis. -Can continue home dose of Lasix 40 mg daily.  BPH -- continue home Finasteride 5 mg daily.  Dispo: Will be discharged today.   Vincent CourserAmin, Vincent Cordaro, MD 04/16/2017, 2:57 PM Pager: [phone]

## 2017-04-16 NOTE — Discharge Summary (Signed)
Name: Vincent Miranda MRN: [id number] DOB: [birth year] 83 y.o. PCP: Vincent Burly, DO  Date of Admission: 04/10/2017  8:24 PM Date of Discharge: 04/16/2017 Attending Physician: Levert Feinstein, MD  Discharge Diagnosis: 1. MRSE bacteremia. 2. Acute on chronic osteomyelitis.   Discharge Medications: Allergies as of 04/16/2017   No Known Allergies     Medication List    TAKE these medications   albuterol 108 (90 Base) MCG/ACT inhaler Commonly known as:  PROVENTIL HFA;VENTOLIN HFA Inhale 2 puffs into the lungs every 6 (six) hours as needed for wheezing or shortness of breath.   allopurinol 100 MG tablet Commonly known as:  ZYLOPRIM Take 2 tablets (200 mg total) by mouth daily. What changed:  how much to take  when to take this   amLODipine 2.5 MG tablet Commonly known as:  NORVASC Take 3 tablets (7.5 mg total) by mouth daily. Start taking on:  04/17/2017   amoxicillin-clavulanate 500-125 MG tablet Commonly known as:  AUGMENTIN Take 1 tablet (500 mg total) by mouth every 12 (twelve) hours.   aspirin EC 81 MG tablet Take 81 mg by mouth daily.   doxycycline 100 MG tablet Commonly known as:  VIBRA-TABS Take 1 tablet (100 mg total) by mouth 2 (two) times daily.   finasteride 5 MG tablet Commonly known as:  PROSCAR Take 1 tablet (5 mg total) by mouth daily.   furosemide 40 MG tablet Commonly known as:  LASIX Take 1 tablet (40 mg total) by mouth daily.   ibuprofen 200 MG tablet Commonly known as:  ADVIL,MOTRIN Take 200-400 mg by mouth every 6 (six) hours as needed for mild pain.   potassium chloride SA 20 MEQ tablet Commonly known as:  K-DUR,KLOR-CON Take 20 mEq by mouth daily.   SSD 1 % cream Generic drug:  silver sulfADIAZINE apply to affected area once daily   Zinc 50 MG Tabs Take 50 mg by mouth daily.       Disposition and follow-up:   Vincent Miranda was discharged from [facility] in Good condition.  At the  hospital follow up visit please address:  1.  His blood pressure as we increase his amlodipine to 7.5 mg daily. -Please monitor his renal functions and adjust antibiotic doses accordingly. -He was placed on Augmentin and doxycycline for 5 more weeks.  2.  Labs / imaging needed at time of follow-up: BMP  3.  Pending labs/ test needing follow-up: None.  Follow-up Appointments: Follow-up Information    Vincent Burly, DO. Schedule an appointment as soon as possible for a visit.   Specialty:  Internal Medicine Why:  To be seen within one week. Contact information: [address] [place] [address] [phone]           Hospital Course by problem list: Vincent Miranda a 83 y.o.gentlemanwith PMH gout, chronic leg wounds and osteomyelitissecondary to PVD,PAFnot on anticoagualtion, HTN, and CKD3 who presents from home when his wife found him to have BP 200/156 and HR > 100.In ED was febrile to 103.2 with leukocytosis.  MRSEbacteremia/Acute on chronic osteomyelitis.  His x-ray shows a new bony irregularity along the medial malleolus suggesting new site of osteomyelitis. His blood culture grew coagulase negative staph species in 1 out of 2 bottles-there was some concern of contamination. Because of his age and bacteremic symptoms he was treated with cefepime and vancomycin for 7 days. He became afebrile after 1 day and leukocytosis normalized. He was discharged home on Augmentin and  doxycycline for 5 more week to complete a total of 6 weeks course. We offered him home health care for wound care, his wife declined stating that she can take care of his wounds herself.  Hypertension. His blood pressure remained elevated during most of his stay in hospital. His amlodipine was increased to 7.5 mg daily. We continued his home dose of Lasix 40 mg daily. His PCP should be able to monitor and titrate accordingly.  CKD Stage III.  His creatinine was elevated on presentation.  Later with fluid resuscitation it came back to his baseline.  Peripheral vascular disease. Patient has extensive peripheral vascular disease leading to bilateral lower extremity ulcerations. He does not want any surgical intervention. We Continue home dose of aspirin 81 mg daily.  Chronic lower extremity edema.Patient has an history of chronic lower extremity edema secondary to venous stasis. He can  continue home dose of Lasix 40 mg daily.  BPH -- continue home Finasteride 5 mg daily.   Discharge Vitals:   BP (!) 164/85 (BP Location: Right Arm)   Pulse 84   Temp 98 F (36.7 C) (Oral)   Resp 16   Ht 5\' 11"  (1.803 m)   Wt 152 lb (68.9 kg)   SpO2 96%   BMI 21.20 kg/m   Gen.Well-developed elderly man,in no acute distress. Lungs.Clear bilaterally. CV.Regular rate and rhythm. Abdomen.Soft, nontender, nondistended, bowel sounds positive. Extremities. Clean bandage covering both feet and ankle.  Pertinent Labs, Studies, and Procedures:   Recent Labs Lab 04/12/17 0431 04/13/17 0809 04/14/17 1603  HGB 7.2* 7.9* 7.4*  HCT 22.7* 25.0* 24.2*  WBC 20.9* 15.8* 10.0  PLT 270 286 262    Recent Labs Lab 04/11/17 0546 04/12/17 0431 04/13/17 0809 04/14/17 1603 04/15/17 0646  NA 141 141 142 143 144  K 3.9 3.4* 4.0 3.4* 3.6  CL 111 111 112* 110 111  CO2 23 24 25 27 27   GLUCOSE 88 86 102* 132* 79  BUN 18 19 21* 20 20  CREATININE 1.66* 1.72* 1.62* 1.38* 1.49*  CALCIUM 8.6* 9.1 9.2 9.0 9.2   Urinalysis    Component Value Date/Time   COLORURINE STRAW (A) 04/10/2017 2132   APPEARANCEUR CLEAR 04/10/2017 2132   APPEARANCEUR Cloudy (A) 02/15/2016 1705   LABSPEC 1.008 04/10/2017 2132   PHURINE 6.0 04/10/2017 2132   GLUCOSEU NEGATIVE 04/10/2017 2132   HGBUR SMALL (A) 04/10/2017 2132   BILIRUBINUR NEGATIVE 04/10/2017 2132   BILIRUBINUR Negative 02/15/2016 1705   KETONESUR NEGATIVE 04/10/2017 2132   PROTEINUR NEGATIVE 04/10/2017 2132   UROBILINOGEN 0.2 03/26/2015 1400     NITRITE NEGATIVE 04/10/2017 2132   LEUKOCYTESUR NEGATIVE 04/10/2017 2132   LEUKOCYTESUR Negative 02/15/2016 1705   Urine culture  Order: [id number]  Status:  Final result Visible to patient:  No (Not Released) Next appt:  None   7d ago  Specimen Description URINE, CATHETERIZED   Special Requests NONE   Culture NO GROWTH   Report Status 04/12/2017 FINAL        Blood Culture ID Panel (Reflexed)  Order: [id number]  Status:  Final result Visible to patient:  No (Not Released) Next appt:  None    Ref Range & Units 7d ago 43yr ago   Enterococcus species NOT DETECTED NOT DETECTED     Listeria monocytogenes NOT DETECTED NOT DETECTED     Staphylococcus species NOT DETECTED DETECTED     Comment: Methicillin (oxacillin) resistant coagulase negative staphylococcus. Possible blood culture contaminant (unless isolated from more than one blood  culture draw or clinical case suggests pathogenicity). No antibiotic treatment is indicated for blood  culture contaminants.  CRITICAL RESULT CALLED TO, READ BACK BY AND VERIFIED WITH:  T. RUDISILL PHARMD, AT 2032 04/11/17 BY D. VANHOOK    Staphylococcus aureus NOT DETECTED NOT DETECTED  NEGATIVER, CM    Methicillin resistance NOT DETECTED DETECTED     Comment: CRITICAL RESULT CALLED TO, READ BACK BY AND VERIFIED WITH:  T. RUDISILL PHARMD, AT 2032 04/11/17 BY D. VANHOOK    Streptococcus species NOT DETECTED NOT DETECTED     Streptococcus agalactiae NOT DETECTED NOT DETECTED     Streptococcus pneumoniae NOT DETECTED NOT DETECTED     Streptococcus pyogenes NOT DETECTED NOT DETECTED     Acinetobacter baumannii NOT DETECTED NOT DETECTED     Enterobacteriaceae species NOT DETECTED NOT DETECTED     Enterobacter cloacae complex NOT DETECTED NOT DETECTED     Escherichia coli NOT DETECTED NOT DETECTED     Klebsiella oxytoca NOT DETECTED NOT DETECTED     Klebsiella pneumoniae NOT DETECTED NOT DETECTED     Proteus species NOT DETECTED NOT DETECTED     Serratia  marcescens NOT DETECTED NOT DETECTED     Haemophilus influenzae NOT DETECTED NOT DETECTED     Neisseria meningitidis NOT DETECTED NOT DETECTED     Pseudomonas aeruginosa NOT DETECTED NOT DETECTED     Candida albicans NOT DETECTED NOT DETECTED     Candida glabrata NOT DETECTED NOT DETECTED     Candida krusei NOT DETECTED NOT DETECTED     Candida parapsilosis NOT DETECTED NOT DETECTED     Candida tropicalis NOT DETECTED NOT DETECTED         C reactive protein. 12.4 Sedimentation rate. 130   DG chest. FINDINGS: Cardiac enlargement without vascular congestion. Atelectasis or infiltration in both lung bases. No blunting of costophrenic angles. No pneumothorax. Probable emphysematous changes in the lungs. Calcification of the aorta. Degenerative changes in the right shoulder.  IMPRESSION: Emphysematous changes in the lungs. No active pulmonary disease. Cardiac enlargement.  DG right foot. FINDINGS: Chronic ulceration and soft tissue loss along the lateral aspect of the right forefoot. No radiopaque soft tissue foreign bodies or soft tissue gas. Previous amputation of the right fifth toe at the mid metatarsal region. There is bone resorption at the distal right fourth metatarsal head and of the proximal phalanx of the right fourth toe with acute angulation of the right fourth toe. This appearance is unchanged since previous study. Degenerative changes in the first metatarsal-phalangeal joint. No obvious acute bone erosions. Mild chronic periosteal reaction along the metatarsal shafts is likely reactive and unchanged since prior study. No evidence of acute fracture or dislocation. Degenerative changes in the intertarsal joints and right ankle joint.  IMPRESSION: Old bone amputations and bone resorption. Soft tissue ulceration along the lateral right foot. Degenerative changes of the first metatarsal-phalangeal joint. No change since prior study.  DG right ankle. FINDINGS: There is chronic  periosteal reaction along the medial malleolus and posterior aspect of the distal tibia. Cortical changes are increasing since previous study and there is now cortical loss and irregularity demonstrated along the medial malleolus, new since previous study. There is overlying ulceration in this area. This suggests developing acute osteomyelitis. Degenerative changes in the ankle joint and intertarsal joints. Small calcaneal spurs. Diffuse soft tissue swelling and edema. No soft tissue gas or radiopaque foreign bodies demonstrated. Vascular calcifications.  IMPRESSION: Chronic periosteal reaction along the distal tibia and fibula.  New developing cortical loss and irregularity along the medial malleolus suggesting developing osteomyelitis. Diffuse soft tissue swelling and edema.  DG left foot.  FINDINGS: Previous amputation of the left first toe at the tarsometatarsal joint. Apparent amputation of the second toe at the metatarsal-phalangeal level. Previous resection or resorption is at the distal second and third metatarsal heads. Erosions at the proximal third phalanx with displacement at the metatarsal-phalangeal joint. Appearances are similar to previous study. No new bone changes to suggest developing acute osteomyelitis. Diffuse soft tissue swelling. Vascular calcifications. No radiopaque soft tissue foreign bodies or soft tissue gas collections. Ulceration on the plantar aspect of the metatarsal phalangeal level.  IMPRESSION: Soft tissue swelling and ulceration. Old bone resorptions and amputations. No change since prior study.  DG left ankle.  FINDINGS: There is periosteal reaction along the distal right tibia and fibula, similar to previous study. This likely represents reactive bone. No evidence of bone erosion to suggest osteomyelitis. Diffuse soft tissue swelling and edema. No radiopaque foreign bodies or soft tissue gas collections. Vascular calcifications. Degenerative changes in  the ankle joint.  IMPRESSION: Chronic periosteal reaction along the distal tibia and fibula. Diffuse soft tissue swelling and edema. No definite evidence of osteomyelitis.    Discharge Instructions: Discharge Instructions    Diet - low sodium heart healthy    Complete by:  As directed    Discharge instructions    Complete by:  As directed    It was pleasure taking care of you. I increased your blood pressure medicine called amlodipine from 5-7.5 mg daily as your blood pressure remains elevated during your stay in hospital. I'm also giving you a prescription for 2 antibiotics as we discussed today, both needs to be taken twice daily, 12 hour apart, for 5 weeks. You needs to follow-up with your primary care physician within next week so he can add just your antibiotic doses if needed according to your kidney functions.   Increase activity slowly    Complete by:  As directed       Signed: Arnetha Courser, MD 04/16/2017, 3:50 PM   Pager: [phone]

## 2017-04-16 NOTE — Progress Notes (Signed)
Pt discharge home, discharge instructions, medications and follow up appointments discussed and reviewed with pt and wife at bedside, verbalized understanding. IV discontinued, catheter intact, site clean and dry. No concerns or questions at this time. Pt was escorted out of the unit in wheelchair, took all belongings with them,

## 2017-04-18 ENCOUNTER — Encounter (HOSPITAL_COMMUNITY): Payer: Self-pay | Admitting: Emergency Medicine

## 2017-04-18 ENCOUNTER — Observation Stay (HOSPITAL_COMMUNITY)
Admission: EM | Admit: 2017-04-18 | Discharge: 2017-04-19 | Disposition: A | Discharge status: Home / Self Care | Payer: Medicare Other | Attending: Student in an Organized Health Care Education/Training Program | Admitting: Student in an Organized Health Care Education/Training Program

## 2017-04-18 ENCOUNTER — Emergency Department (HOSPITAL_COMMUNITY): Payer: Medicare Other

## 2017-04-18 DIAGNOSIS — Z87891 Personal history of nicotine dependence: Secondary | ICD-10-CM | POA: Insufficient documentation

## 2017-04-18 DIAGNOSIS — N183 Chronic kidney disease, stage 3 (moderate): Secondary | ICD-10-CM | POA: Insufficient documentation

## 2017-04-18 DIAGNOSIS — Z7982 Long term (current) use of aspirin: Secondary | ICD-10-CM | POA: Insufficient documentation

## 2017-04-18 DIAGNOSIS — I129 Hypertensive chronic kidney disease with stage 1 through stage 4 chronic kidney disease, or unspecified chronic kidney disease: Secondary | ICD-10-CM | POA: Diagnosis not present

## 2017-04-18 DIAGNOSIS — I959 Hypotension, unspecified: Secondary | ICD-10-CM | POA: Diagnosis not present

## 2017-04-18 DIAGNOSIS — E860 Dehydration: Secondary | ICD-10-CM

## 2017-04-18 DIAGNOSIS — Z79899 Other long term (current) drug therapy: Secondary | ICD-10-CM | POA: Diagnosis not present

## 2017-04-18 DIAGNOSIS — R404 Transient alteration of awareness: Secondary | ICD-10-CM | POA: Diagnosis not present

## 2017-04-18 DIAGNOSIS — I517 Cardiomegaly: Secondary | ICD-10-CM | POA: Diagnosis not present

## 2017-04-18 DIAGNOSIS — N179 Acute kidney failure, unspecified: Secondary | ICD-10-CM | POA: Diagnosis not present

## 2017-04-18 DIAGNOSIS — R55 Syncope and collapse: Secondary | ICD-10-CM | POA: Diagnosis not present

## 2017-04-18 LAB — CBC WITH DIFFERENTIAL/PLATELET
BASOS PCT: 0 %
Basophils Absolute: 0 10*3/uL (ref 0.0–0.1)
EOS ABS: 0.1 10*3/uL (ref 0.0–0.7)
EOS PCT: 1 %
HEMATOCRIT: 25.8 % — AB (ref 39.0–52.0)
Hemoglobin: 8.1 g/dL — ABNORMAL LOW (ref 13.0–17.0)
Lymphocytes Relative: 7 %
Lymphs Abs: 0.8 10*3/uL (ref 0.7–4.0)
MCH: 26.3 pg (ref 26.0–34.0)
MCHC: 31.4 g/dL (ref 30.0–36.0)
MCV: 83.8 fL (ref 78.0–100.0)
MONO ABS: 1.1 10*3/uL — AB (ref 0.1–1.0)
MONOS PCT: 9 %
Neutro Abs: 10.2 10*3/uL — ABNORMAL HIGH (ref 1.7–7.7)
Neutrophils Relative %: 83 %
Platelets: 237 10*3/uL (ref 150–400)
RBC: 3.08 MIL/uL — ABNORMAL LOW (ref 4.22–5.81)
RDW: 15.9 % — AB (ref 11.5–15.5)
WBC: 12.2 10*3/uL — ABNORMAL HIGH (ref 4.0–10.5)

## 2017-04-18 LAB — COMPREHENSIVE METABOLIC PANEL
ALBUMIN: 2.1 g/dL — AB (ref 3.5–5.0)
ALT: 9 U/L — ABNORMAL LOW (ref 17–63)
ANION GAP: 8 (ref 5–15)
AST: 15 U/L (ref 15–41)
Alkaline Phosphatase: 72 U/L (ref 38–126)
BILIRUBIN TOTAL: 0.4 mg/dL (ref 0.3–1.2)
BUN: 30 mg/dL — ABNORMAL HIGH (ref 6–20)
CHLORIDE: 109 mmol/L (ref 101–111)
CO2: 25 mmol/L (ref 22–32)
Calcium: 9 mg/dL (ref 8.9–10.3)
Creatinine, Ser: 2.4 mg/dL — ABNORMAL HIGH (ref 0.61–1.24)
GFR calc Af Amer: 26 mL/min — ABNORMAL LOW (ref >60)
GFR calc non Af Amer: 22 mL/min — ABNORMAL LOW (ref >60)
GLUCOSE: 115 mg/dL — AB (ref 65–99)
POTASSIUM: 3.7 mmol/L (ref 3.5–5.1)
Sodium: 142 mmol/L (ref 135–145)
TOTAL PROTEIN: 7.4 g/dL (ref 6.5–8.1)

## 2017-04-18 LAB — I-STAT CHEM 8, ED
BUN: 40 mg/dL — AB (ref 6–20)
Calcium, Ion: 1.22 mmol/L (ref 1.15–1.40)
Chloride: 107 mmol/L (ref 101–111)
Creatinine, Ser: 2.5 mg/dL — ABNORMAL HIGH (ref 0.61–1.24)
Glucose, Bld: 116 mg/dL — ABNORMAL HIGH (ref 65–99)
HCT: 24 % — ABNORMAL LOW (ref 39.0–52.0)
HEMOGLOBIN: 8.2 g/dL — AB (ref 13.0–17.0)
POTASSIUM: 3.9 mmol/L (ref 3.5–5.1)
SODIUM: 143 mmol/L (ref 135–145)
TCO2: 28 mmol/L (ref 0–100)

## 2017-04-18 LAB — I-STAT TROPONIN, ED: TROPONIN I, POC: 0.01 ng/mL (ref 0.00–0.08)

## 2017-04-18 LAB — CBG MONITORING, ED: GLUCOSE-CAPILLARY: 109 mg/dL — AB (ref 65–99)

## 2017-04-18 MED ORDER — ASPIRIN EC 81 MG PO TBEC
81.0000 mg | DELAYED_RELEASE_TABLET | Freq: Every day | ORAL | Status: DC
  Administered 2017-04-19: 81 mg via ORAL
  Filled 2017-04-18: qty 1

## 2017-04-18 MED ORDER — FINASTERIDE 5 MG PO TABS: 5.0000 mg | ORAL_TABLET | Freq: Every day | ORAL | Status: DC

## 2017-04-18 MED ORDER — ACETAMINOPHEN 325 MG PO TABS: 650.0000 mg | ORAL_TABLET | Freq: Four times a day (QID) | ORAL | Status: DC | PRN

## 2017-04-18 MED ORDER — SODIUM CHLORIDE 0.9 % IV BOLUS (SEPSIS)
500.0000 mL | Freq: Once | INTRAVENOUS | Status: AC
  Administered 2017-04-18: 500 mL via INTRAVENOUS

## 2017-04-18 MED ORDER — AMOXICILLIN-POT CLAVULANATE 500-125 MG PO TABS
1.0000 | ORAL_TABLET | Freq: Two times a day (BID) | ORAL | Status: DC
  Administered 2017-04-19 (×2): 500 mg via ORAL
  Filled 2017-04-18 (×3): qty 1

## 2017-04-18 MED ORDER — DOXYCYCLINE HYCLATE 100 MG PO TABS
100.0000 mg | ORAL_TABLET | Freq: Two times a day (BID) | ORAL | Status: DC
Start: 2017-04-18 — End: 2017-04-19
  Administered 2017-04-19 (×2): 100 mg via ORAL
  Filled 2017-04-18 (×2): qty 1

## 2017-04-18 MED ORDER — ACETAMINOPHEN 650 MG RE SUPP: 650.0000 mg | Freq: Four times a day (QID) | RECTAL | Status: DC | PRN

## 2017-04-18 MED ORDER — ALBUTEROL SULFATE (2.5 MG/3ML) 0.083% IN NEBU: 3.0000 mL | INHALATION_SOLUTION | Freq: Four times a day (QID) | RESPIRATORY_TRACT | Status: DC | PRN

## 2017-04-18 MED ORDER — SODIUM CHLORIDE 0.9 % IV SOLN
INTRAVENOUS | Status: AC
  Administered 2017-04-18: 21:00:00 via INTRAVENOUS

## 2017-04-18 MED ORDER — ZINC 50 MG PO TABS: 50.0000 mg | ORAL_TABLET | Freq: Every day | ORAL | Status: DC

## 2017-04-18 NOTE — ED Provider Notes (Signed)
[facility]-EMERGENCY DEPT Provider Note   CSN: [id number] Arrival date & time: 04/18/17  1829     History   Chief Complaint Syncope, Hypotension  HPI Vincent Miranda is a 83 y.o. male.  Patient with history of atrial fibrillation, chronic osteomyelitis, chronic kidney disease, peripheral vascular disease who presents the ED after P and found with low blood pressure by his wife. Patient recently recently discharged from the hospital for MRSE bacteremia and chronic osteomyelitis. Patient had IV antibiotics and discharged on Augmentin and doxycycline. Patient also had his amlodipine increased. Wife has been performing wound changes at home. Patient denies chest pain, shortness of breath, loss of consciousness. Patient has felt a little shaky but otherwise asymptomatic at this time.   The history is provided by the patient.  Illness  This is a new problem. The current episode started less than 1 hour ago. The problem has been resolved. Pertinent negatives include no chest pain, no abdominal pain, no headaches and no shortness of breath. Nothing aggravates the symptoms. Nothing relieves the symptoms. He has tried nothing for the symptoms.    Past Medical History:  Diagnosis Date  . Acute blood loss anemia 10/18/2013  . Anemia of chronic disease   . Atrial fibrillation (HCC)   . Chronic kidney disease   . Chronic osteomyelitis (HCC)   . Gout   . History of blood transfusion   . Hypertension   . PVD (peripheral vascular disease) [facility](HCC)     Patient Active Problem List   Diagnosis Date Noted  . AKI (acute kidney injury) (HCC) 04/18/2017  . Sepsis due to coagulase-negative staphylococcal infection (HCC)   . Cellulitis of right lower extremity   . Lactic acidosis   . Osteomyelitis of ankle or foot, right, acute (HCC)   . Atrial fibrillation, chronic (HCC)   . Sepsis (HCC) 04/10/2017  . Chronic osteomyelitis (HCC) 12/09/2016  . Rash and nonspecific skin eruption 09/08/2016  . Abnormal  urinalysis 09/08/2016  . Paroxysmal atrial fibrillation (HCC) 03/01/2016  . Hypotension 02/20/2016  . Urinary retention 02/15/2016  . Gout 02/03/2016  . Cellulitis of left lower extremity   . Peripheral vascular disease (HCC) 03/28/2015  . Anemia of chronic disease   . CKD (chronic kidney disease), stage III 10/18/2013  . Essential hypertension   . Protein-calorie malnutrition, severe (HCC) 08/07/2013    Past Surgical History:  Procedure Laterality Date  . AMPUTATION Left 10/19/2013   Procedure: AMPUTATION RAY  LEFT GREAT TOE;  Surgeon: Nadara MustardMarcus V Duda, MD;  Location: [facility] OR;  Service: Orthopedics;  Laterality: Left;  . EYE SURGERY Right    "had accident; had to replace lens in eye"       Home Medications    Prior to Admission medications   Medication Sig Start Date End Date Taking? Authorizing Provider  albuterol (PROVENTIL HFA;VENTOLIN HFA) 108 (90 Base) MCG/ACT inhaler Inhale 2 puffs into the lungs every 6 (six) hours as needed for wheezing or shortness of breath. 09/10/16  Yes Valentino NoseBoswell, Nathan, MD  allopurinol (ZYLOPRIM) 100 MG tablet Take 2 tablets (200 mg total) by mouth daily. Patient taking differently: Take 100 mg by mouth 2 (two) times daily.  03/21/16  Yes Selina CooleyFlores, Kyle, MD  amoxicillin-clavulanate (AUGMENTIN) 500-125 MG tablet Take 1 tablet (500 mg total) by mouth every 12 (twelve) hours. 04/16/17 05/21/17 Yes Arnetha CourserAmin, Sumayya, MD  aspirin EC 81 MG tablet Take 81 mg by mouth daily.    Yes [provider]  doxycycline (VIBRA-TABS) 100 MG tablet Take  1 tablet (100 mg total) by mouth 2 (two) times daily. 04/16/17 05/21/17 Yes Arnetha Courser, MD  finasteride (PROSCAR) 5 MG tablet Take 1 tablet (5 mg total) by mouth daily. 06/22/16 06/22/17 Yes Gwynn Burly, DO  furosemide (LASIX) 40 MG tablet Take 1 tablet (40 mg total) by mouth daily. 06/24/16  Yes Gwynn Burly, DO  ibuprofen (ADVIL,MOTRIN) 200 MG tablet Take 200-400 mg by mouth every 6 (six) hours as needed for mild pain.   Yes  [provider]  potassium chloride SA (K-DUR,KLOR-CON) 20 MEQ tablet Take 20 mEq by mouth daily. 01/08/17  Yes [provider]  SSD 1 % cream apply to affected area once daily 04/02/17  Yes Gwynn Burly, DO  Zinc 50 MG TABS Take 50 mg by mouth daily.   Yes [provider]    Family History History reviewed. No pertinent family history.  Social History Social History  Substance Use Topics  . Smoking status: Former Smoker    Packs/day: 1.00    Years: 43.00    Types: Cigarettes  . Smokeless tobacco: Never Used     Comment: "quit smoking cigarettes in the 1980s"  . Alcohol use Yes     Comment: "nothing anymore" (02/03/2016)     Allergies   Patient has no known allergies.   Review of Systems Review of Systems  Constitutional: Negative for chills and fever.  HENT: Negative for ear pain and sore throat.   Eyes: Negative for pain and visual disturbance.  Respiratory: Negative for cough and shortness of breath.   Cardiovascular: Negative for chest pain and palpitations.  Gastrointestinal: Negative for abdominal pain and vomiting.  Genitourinary: Negative for dysuria and hematuria.  Musculoskeletal: Negative for arthralgias and back pain.  Skin: Negative for color change and rash.  Neurological: Negative for seizures, syncope and headaches.  All other systems reviewed and are negative.    Physical Exam Updated Vital Signs  ED Triage Vitals  Enc Vitals Group     BP 04/18/17 1830 105/86     Pulse Rate 04/18/17 1830 81     Resp 04/18/17 1830 14     Temp 04/18/17 1830 97.6 F (36.4 C)     Temp Source 04/18/17 1830 Oral     SpO2 04/18/17 1830 97 %     Weight 04/18/17 1827 160 lb (72.6 kg)     Height 04/18/17 1827 5\' 11"  (1.803 m)     Head Circumference --      Peak Flow --      Pain Score --      Pain Loc --      Pain Edu? --      Excl. in GC? --     Physical Exam  Constitutional: He is oriented to person, place, and time. He appears  well-developed and well-nourished.  HENT:  Head: Normocephalic and atraumatic.  Eyes: Conjunctivae and EOM are normal. Pupils are equal, round, and reactive to light.  Neck: Normal range of motion. Neck supple.  Cardiovascular: Normal rate and regular rhythm.   No murmur heard. Pulmonary/Chest: Effort normal and breath sounds normal. No respiratory distress.  Abdominal: Soft. There is no tenderness.  Musculoskeletal: He exhibits no edema.  Neurological: He is alert and oriented to person, place, and time. No cranial nerve deficit or sensory deficit. Coordination normal.  5+/5 muscle strength, normal sensation, no drift, normal finger to nose finger  Skin: Skin is warm and dry.  B/l chronic ulcers to feet with no signs of acute  infection, no drainage, no erythema  Psychiatric: He has a normal mood and affect.  Nursing note and vitals reviewed.    ED Treatments / Results  Labs (all labs ordered are listed, but only abnormal results are displayed) Labs Reviewed  CBC WITH DIFFERENTIAL/PLATELET - Abnormal; Notable for the following:       Result Value   WBC 12.2 (*)    RBC 3.08 (*)    Hemoglobin 8.1 (*)    HCT 25.8 (*)    RDW 15.9 (*)    Neutro Abs 10.2 (*)    Monocytes Absolute 1.1 (*)    All other components within normal limits  COMPREHENSIVE METABOLIC PANEL - Abnormal; Notable for the following:    Glucose, Bld 115 (*)    BUN 30 (*)    Creatinine, Ser 2.40 (*)    Albumin 2.1 (*)    ALT 9 (*)    GFR calc non Af Amer 22 (*)    GFR calc Af Amer 26 (*)    All other components within normal limits  CBG MONITORING, ED - Abnormal; Notable for the following:    Glucose-Capillary 109 (*)    All other components within normal limits  I-STAT CHEM 8, ED - Abnormal; Notable for the following:    BUN 40 (*)    Creatinine, Ser 2.50 (*)    Glucose, Bld 116 (*)    Hemoglobin 8.2 (*)    HCT 24.0 (*)    All other components within normal limits  URINALYSIS, ROUTINE W REFLEX  MICROSCOPIC  BASIC METABOLIC PANEL  I-STAT TROPOININ, ED  I-STAT CG4 LACTIC ACID, ED  I-STAT CG4 LACTIC ACID, ED    EKG  EKG Interpretation None       Radiology Dg Chest Port 1 View  Result Date: 04/18/2017 CLINICAL DATA:  Initial evaluation for acute syncopal episode. EXAM: PORTABLE CHEST 1 VIEW COMPARISON:  Prior radiograph from 04/10/2017. FINDINGS: Cardiomegaly, stable from prior. Mediastinal silhouette within normal limits. Tortuosity the intrathoracic aorta noted. Aortic atherosclerosis. Lungs hypoinflated. Emphysematous changes with basilar prominence of the interstitial markings, similar to previous. No focal infiltrates. No pulmonary edema or pleural effusion. No pneumothorax. No acute osseus abnormality. IMPRESSION: 1. Emphysema with basilar prominence of the interstitial markings, stable from previous. No other active cardiopulmonary disease. 2. Mild cardiomegaly with aortic atherosclerosis. Electronically Signed   By: Rise Mu M.D.   On: 04/18/2017 19:32    Procedures Procedures (including critical care time)  Medications Ordered in ED Medications  0.9 %  sodium chloride infusion ( Intravenous New Bag/Given 04/18/17 2127)  albuterol (PROVENTIL) (2.5 MG/3ML) 0.083% nebulizer solution 3 mL (not administered)  aspirin EC tablet 81 mg (not administered)  amoxicillin-clavulanate (AUGMENTIN) 500-125 MG per tablet 500 mg (not administered)  doxycycline (VIBRA-TABS) tablet 100 mg (not administered)  acetaminophen (TYLENOL) tablet 650 mg (not administered)    Or  acetaminophen (TYLENOL) suppository 650 mg (not administered)  sodium chloride 0.9 % bolus 500 mL (0 mLs Intravenous Stopped 04/18/17 1958)     Initial Impression / Assessment and Plan / ED Course  I have reviewed the triage vital signs and the nursing notes.  Pertinent labs & imaging results that were available during my care of the patient were reviewed by me and considered in my medical decision making  (see chart for details).     Vincent Miranda is a 83 year old male with history of hypertension, chronic osteomyelitis, atrial fibrillation who presents to the ED with low blood pressure versus  possible syncope. Patient's vitals at time of arrival to the ED are unremarkable and patient is without fever. Patient with blood pressure of 100 systolic with EMS and 148/70 here upon arrival after 500 mL of fluid. Patient recently discharged following bacteremia and possibly new osteomyelitis and currently on Augmentin and doxycycline. Patient is asymptomatic at time of arrival to the ED. Wife states that patient was diaphoretic and looked weaker and checked his blood pressure and showed systolics in the 70s. Patient just started on amlodipine 2 days ago and possibly causing hypotension. Patient with no signs of new infection to his bilateral feet. I did admit this patient last week and feet appear improved. No obvious drainage or surrounding erythema. Patient with EKG that showed normal sinus rhythm with no signs of acute ischemia. Patient with initial troponin within normal limits and doubt ACS. Patient with no significant anemia or electrolyte abnormalities. Patient with elevation in creatinine to 2.4 which is increased from creatinine from 2 days ago. Likely from hypotension and amlodipine use. Patient had normal lactic acid and improved leukocytosis and doubt ongoing infection. Patient with chest x-ray that showed no signs of pneumonia as well. No signs to suggest ongoing sepsis. Hypotension likely secondary to amlodipine use and patient admitted to internal medicine service for hydration and adjustment of blood pressure medications. Patient was hemodynamically stable at time of transfer.  Final Clinical Impressions(s) / ED Diagnoses   Final diagnoses:  Hypotension, unspecified hypotension type  AKI (acute kidney injury) [facility])    New Prescriptions Current Discharge Medication List       Virgina Norfolk, DO 04/18/17 2255    Rolland Porter, MD 04/19/17 [phone]

## 2017-04-18 NOTE — ED Triage Notes (Signed)
Pt d/ced on Monday after being treated for sepsis. D/c with PO antibiotics. Yesterday pt became daiphoretic, clammy, and BP lower. Today, Pt was sitting upright in chair, BP 77 systolic, unconscious, clammy, incontinent. EMS reports once he was lowered to floor, BP 106 systolic. Amlodipine was increased upon discharge. EMS reports that pt has no complaints of dizziness. EMS gave 300mL NS. Last BP 128/68, HR 76. Pt alert and oriented per norm. Pt did not receive amlodipine today.

## 2017-04-18 NOTE — H&P (Signed)
Date: 04/18/2017               Patient Name:  Vincent PigeonRobert T Miranda MRN: [id number]  DOB: Sep 15, 1925 Age / Sex: 83 y.o., male   PCP: Gwynn BurlyWallace, Andrew, DO         Medical Service: Internal Medicine Teaching Service         Attending Physician: Dr. Riley ChurchesJim Granfortuna    First Contact: Dr. Arnetha CourserSumayya [name] Pager: [phone]  Second Contact: Dr. Darreld McleanVishal Patel Pager: [phone]       After Hours (After 5p/  First Contact Pager: [phone]  weekends / holidays): Second Contact Pager: [phone]   Chief Complaint: Hypotension  History of Present Illness: Vincent Miranda is a 83 y.o. gentleman with PMH gout, chronic osteomyelitis, PVD, PAFnot on anticoagualtion, HTN, and CKD3 who presents for hypotension and syncope at home. He was recently hospitalized for 6 days due to MRSE bacteremia and acute osteomyelitis and left the hospital two days ago on oral antibiotics. He was started on Amlodipine during this previous hospital stay and the dose was increased on day of discharge. Yesterday his wife noticed him acting sleepier than usual, cold, and clammy and found his BP to be 90s/50s but improved to normal range later that evening. Today she did not give him his Amlodipine and was at baseline all day, eating well. Around 5 PM he again seemed lethargic, cold, and clammy, and his pressure was 90s/50s with a fast heart rate. He recalls the event and noticed no symptoms whatsoever. She tried an alternative BP cuff and found it to be 70s/30s. She called EMS and had Vincent Miranda lay down flat with his legs elevated. He did not pass out at any point. He has been in his normal state of health, taking all his medications, eating well, and denies pain, dyspnea, dizziness, LOC, or palpitations. His wife says his BP has been labile recently. EMS provided IV fluids [facility]   In the ED he was afebrile, HR 81, RR 14, BP 105-148 / 70-86, SpO2 97% on room air. Labs remarkable for AKI with Cr of 2.5 (baseline 1.5).  EKG and CXR unchanged from previous. IMTS contacted for admission.   Meds:  Current Meds  Medication Sig  . albuterol (PROVENTIL HFA;VENTOLIN HFA) 108 (90 Base) MCG/ACT inhaler Inhale 2 puffs into the lungs every 6 (six) hours as needed for wheezing or shortness of breath.  . allopurinol (ZYLOPRIM) 100 MG tablet Take 2 tablets (200 mg total) by mouth daily. (Patient taking differently: Take 100 mg by mouth 2 (two) times daily. )  . amoxicillin-clavulanate (AUGMENTIN) 500-125 MG tablet Take 1 tablet (500 mg total) by mouth every 12 (twelve) hours.  Marland Kitchen. aspirin EC 81 MG tablet Take 81 mg by mouth daily.   Marland Kitchen. doxycycline (VIBRA-TABS) 100 MG tablet Take 1 tablet (100 mg total) by mouth 2 (two) times daily.  . finasteride (PROSCAR) 5 MG tablet Take 1 tablet (5 mg total) by mouth daily.  . furosemide (LASIX) 40 MG tablet Take 1 tablet (40 mg total) by mouth daily.  Marland Kitchen. ibuprofen (ADVIL,MOTRIN) 200 MG tablet Take 200-400 mg by mouth every 6 (six) hours as needed for mild pain.  . potassium chloride SA (K-DUR,KLOR-CON) 20 MEQ tablet Take 20 mEq by mouth daily.  Marland Kitchen. SSD 1 % cream apply to affected area once daily  . Zinc 50 MG TABS Take 50 mg by mouth daily.   Allergies: Allergies as of 04/18/2017  . (  No Known Allergies)   Past Medical History:  Diagnosis Date  . Acute blood loss anemia 10/18/2013  . Anemia of chronic disease   . Atrial fibrillation (HCC)   . Chronic kidney disease   . Chronic osteomyelitis (HCC)   . Gout   . History of blood transfusion   . Hypertension   . PVD (peripheral vascular disease) (HCC)     Family History:  Denies family history of peripheral vascular disease or recurrent infections  Social History:  Social History   Social History  . Marital status: Married    Spouse name: N/A  . Number of children: N/A  . Years of education: N/A   Occupational History  . Not on file.   Social History Main Topics  . Smoking status: Former Smoker    Packs/day: 1.00     Years: 43.00    Types: Cigarettes  . Smokeless tobacco: Never Used     Comment: "quit smoking cigarettes in the 1980s"  . Alcohol use Yes     Comment: "nothing anymore" (02/03/2016)  . Drug use: No  . Sexual activity: Not Currently   Other Topics Concern  . Not on file   Social History Narrative  . No narrative on file    Review of Systems: A complete ROS was negative except as per HPI.   Physical Exam: Blood pressure (!) 143/67, pulse 75, temperature 98.1 F (36.7 C), temperature source Rectal, resp. rate 20, height 5\' 11"  (1.803 m), weight 160 lb (72.6 kg), SpO2 100 %.  General appearance: Thin elderly gentleman resting comfortably in bed, in no distress, poor hearing HENT: Normocephalic, moist mucous membranes Eyes: PERRL, non-icteric Cardiovascular: Regular rate and rhythm, no murmurs, rubs, gallops Respiratory: Diminished breath sounds bilaterally, normal work of breathing Abdomen: BS+, soft, non-tender, non-distended Extremities: Dry chronic uclerations of BLEs, no purulent drainage, 1+ pitting edema to mid calves, 2+ radial pulses Skin: Warm, dry, decreased skin turgor Neuro: Alert and oriented, slow responses, poor memory Psych: Appropriate affect, clear speech, thoughts linear and goal-directed   Assessment & Plan by Problem: Principal Problem:   Hypotension Active Problems:   AKI (acute kidney injury) (HCC)  Hypotension, labile BP at home with intermittent episodes of lethargy, cool, clammy and hypotension to 90s/50s or lower, recently started Amlodipine and this was increased on discharge two days ago. Not previously on antihypertensives. Also prescribed Lasix 40 mg daily and finasteride. Clinical history most consistent with symptomatic hypotension, no overt syncope. Afib with RVR causing hypotension is a possibility, as he has paroxysmal AF. Clinically mildly dry on exam, decreased skin turgor. His po intake seems to be at baseline but is likely low. -- Check  orthostatics -- IV fluids 125 cc/hr overnight -- Discontinued Amlodipine, hold Finasteride and Lasix for now -- Will likely require relaxed BP goals so that his labile pressures do not dip into symptomatic lows -- Telemetry  AKI, creatinine 2.5 from baseline 1.5, likely due to hypoperfusion in setting of recent hypotension. -- IVF per above -- Trend BMP  Osteomyelitis/ MSRE bacteremia  -- Continue Augmentin 500-125 BID and Doxycycline 100 mg BID  FEN/GI: HH diet, replete electrolytes as needed  DVT ppx: Refused  Code status: Full code  Dispo: Admit patient to Observation with expected length of stay less than 2 midnights.  Signed: Althia Forts, MD 04/18/2017, 8:27 PM  Pager: [phone]

## 2017-04-18 NOTE — ED Provider Notes (Signed)
Pt seen and evaluated. Discussed with Resident. Pt awake and alert. Has had episodes of SBP of 70s at home since DC. AKI with Cr of 2.5 vs. Baseline of 1.5. Agree with admit for IVF and holding/adjusting of BP meds.   Rolland PorterJames, Oralee Rapaport, MD 04/18/17 2027

## 2017-04-19 DIAGNOSIS — I129 Hypertensive chronic kidney disease with stage 1 through stage 4 chronic kidney disease, or unspecified chronic kidney disease: Secondary | ICD-10-CM

## 2017-04-19 DIAGNOSIS — N179 Acute kidney failure, unspecified: Secondary | ICD-10-CM

## 2017-04-19 DIAGNOSIS — I959 Hypotension, unspecified: Secondary | ICD-10-CM | POA: Diagnosis not present

## 2017-04-19 DIAGNOSIS — N183 Chronic kidney disease, stage 3 (moderate): Secondary | ICD-10-CM | POA: Diagnosis not present

## 2017-04-19 DIAGNOSIS — M8660 Other chronic osteomyelitis, unspecified site: Secondary | ICD-10-CM

## 2017-04-19 DIAGNOSIS — E860 Dehydration: Secondary | ICD-10-CM

## 2017-04-19 LAB — URINALYSIS, ROUTINE W REFLEX MICROSCOPIC
BACTERIA UA: NONE SEEN
Bilirubin Urine: NEGATIVE
Glucose, UA: NEGATIVE mg/dL
KETONES UR: NEGATIVE mg/dL
LEUKOCYTES UA: NEGATIVE
Nitrite: NEGATIVE
PROTEIN: 30 mg/dL — AB
SQUAMOUS EPITHELIAL / LPF: NONE SEEN
Specific Gravity, Urine: 1.009 (ref 1.005–1.030)
pH: 6 (ref 5.0–8.0)

## 2017-04-19 LAB — BASIC METABOLIC PANEL
ANION GAP: 7 (ref 5–15)
BUN: 34 mg/dL — AB (ref 6–20)
CALCIUM: 9.1 mg/dL (ref 8.9–10.3)
CO2: 27 mmol/L (ref 22–32)
Chloride: 107 mmol/L (ref 101–111)
Creatinine, Ser: 2.29 mg/dL — ABNORMAL HIGH (ref 0.61–1.24)
GFR calc Af Amer: 27 mL/min — ABNORMAL LOW (ref >60)
GFR, EST NON AFRICAN AMERICAN: 24 mL/min — AB (ref >60)
GLUCOSE: 87 mg/dL (ref 65–99)
POTASSIUM: 3.4 mmol/L — AB (ref 3.5–5.1)
Sodium: 141 mmol/L (ref 135–145)

## 2017-04-19 NOTE — Discharge Summary (Signed)
Name: Vincent Miranda MRN: [id number] DOB: [birth year] 83 y.o. PCP: Gwynn Burly, DO  Date of Admission: 04/18/2017  6:29 PM Date of Discharge: 04/19/2017 Attending Physician: Levert Feinstein, MD  Discharge Diagnosis: 1. Hypotension. 2.AKI on CKD due to hypoperfusion.  Principal Problem:   Hypotension Active Problems:   AKI (acute kidney injury) [facility])   Discharge Medications: Allergies as of 04/19/2017   No Known Allergies     Medication List    STOP taking these medications   furosemide 40 MG tablet Commonly known as:  LASIX   ibuprofen 200 MG tablet Commonly known as:  ADVIL,MOTRIN   potassium chloride SA 20 MEQ tablet Commonly known as:  K-DUR,KLOR-CON     TAKE these medications   albuterol 108 (90 Base) MCG/ACT inhaler Commonly known as:  PROVENTIL HFA;VENTOLIN HFA Inhale 2 puffs into the lungs every 6 (six) hours as needed for wheezing or shortness of breath. What changed:  how much to take   allopurinol 100 MG tablet Commonly known as:  ZYLOPRIM Take 2 tablets (200 mg total) by mouth daily. What changed:  how much to take  when to take this   amoxicillin-clavulanate 500-125 MG tablet Commonly known as:  AUGMENTIN Take 1 tablet (500 mg total) by mouth every 12 (twelve) hours.   aspirin EC 81 MG tablet Take 81 mg by mouth daily.   doxycycline 100 MG tablet Commonly known as:  VIBRA-TABS Take 1 tablet (100 mg total) by mouth 2 (two) times daily.   finasteride 5 MG tablet Commonly known as:  PROSCAR Take 1 tablet (5 mg total) by mouth daily.   SSD 1 % cream Generic drug:  silver sulfADIAZINE apply to affected area once daily   Zinc 50 MG Tabs Take 50 mg by mouth daily.       Disposition and follow-up:   Vincent Miranda was discharged from [facility] in Good condition.  At the hospital follow up visit please address:  1.  His blood pressure, as we are asked him to hold amlodipine and Lasix, these can  be restarted if needed. I also asked him to hold his potassium, he should be restarted on potassium along with Lasix.  2.  Labs / imaging needed at time of follow-up: BMP  3.  Pending labs/ test needing follow-up: None  Follow-up Appointments: Follow-up Information    Gwynn Burly, DO. Schedule an appointment as soon as possible for a visit.   Specialty:  Internal Medicine Why:  To be seen early next week. Contact information: [address] [place] [address] [phone]           Hospital Course by problem list:  Vincent Miranda a 83 y.o.gentlemanwith PMH gout, chronic osteomyelitis, PVD,PAFnot on anticoagualtion, HTN, and CKD3 who presents for hypotension and syncope at home. He was recently hospitalized for 6 days due to MRSE bacteremia and acute osteomyelitis and left the hospital two days ago on oral antibiotics.  Hypotension. His blood pressure was 105/56 recorded in ED, he was little dizzy. At home his wife recorded a blood pressure of 90/50. He was given 500 mL bolus, his blood pressure normalized and dizziness improved. During previous hospitalization his amlodipine was increased from 5-7.5 mg because of persistent elevated blood pressure. On discharge he was advised to hold his amlodipine and Lasix. He was lost to follow-up early next week to monitor his blood pressure and need of restarting his antihypertensives.  AKI on CKD. Most likely due  to hypoperfusion in the setting of hypotension. Creatinine started to improve. He should encouraged to increase his fluid intake. He will need a BMP on follow-up.  Osteomyelitis/ MSRE bacteremia. Clinically stable, no sign of active infection. -Continue Augmentin and doxycycline as advised to complete total of 6 weeks.  Discharge Vitals:   BP 138/60 (BP Location: Left Arm)   Pulse 85   Temp 98 F (36.7 C) (Oral)   Resp 17   Ht 5\' 11"  (1.803 m)   Wt 160 lb (72.6 kg)   SpO2 96%   BMI 22.32 kg/m    Gen. Alert elderly man, in no acute distress. Lungs. Clear bilaterally. CV. Regular rate and rhythm. Abdomen. Soft, nontender, bowel sounds positive. Extremities. Bilateral clean wounds on his feet and ankles, no discharge, no edema or erythema.  Pertinent Labs, Studies, and Procedures:  CBC Latest Ref Rng & Units 04/18/2017 04/18/2017 04/14/2017  WBC 4.0 - 10.5 K/uL - 12.2(H) 10.0  Hemoglobin 13.0 - 17.0 g/dL 8.2(L) 8.1(L) 7.4(L)  Hematocrit 39.0 - 52.0 % 24.0(L) 25.8(L) 24.2(L)  Platelets 150 - 400 K/uL - 237 262   CMP Latest Ref Rng & Units 04/19/2017 04/18/2017 04/18/2017  Glucose 65 - 99 mg/dL 87 604(V) 409(W)  BUN 6 - 20 mg/dL 11(B) 14(N) 82(N)  Creatinine 0.61 - 1.24 mg/dL 5.62(Z) 3.08(M) 5.78(I)  Sodium 135 - 145 mmol/L 141 143 142  Potassium 3.5 - 5.1 mmol/L 3.4(L) 3.9 3.7  Chloride 101 - 111 mmol/L 107 107 109  CO2 22 - 32 mmol/L 27 - 25  Calcium 8.9 - 10.3 mg/dL 9.1 - 9.0  Total Protein 6.5 - 8.1 g/dL - - 7.4  Total Bilirubin 0.3 - 1.2 mg/dL - - 0.4  Alkaline Phos 38 - 126 U/L - - 72  AST 15 - 41 U/L - - 15  ALT 17 - 63 U/L - - 9(L)   Urinalysis    Component Value Date/Time   COLORURINE YELLOW 04/19/2017 0753   APPEARANCEUR CLEAR 04/19/2017 0753   APPEARANCEUR Cloudy (A) 02/15/2016 1705   LABSPEC 1.009 04/19/2017 0753   PHURINE 6.0 04/19/2017 0753   GLUCOSEU NEGATIVE 04/19/2017 0753   HGBUR SMALL (A) 04/19/2017 0753   BILIRUBINUR NEGATIVE 04/19/2017 0753   BILIRUBINUR Negative 02/15/2016 1705   KETONESUR NEGATIVE 04/19/2017 0753   PROTEINUR 30 (A) 04/19/2017 0753   UROBILINOGEN 0.2 03/26/2015 1400   NITRITE NEGATIVE 04/19/2017 0753   LEUKOCYTESUR NEGATIVE 04/19/2017 0753   LEUKOCYTESUR Negative 02/15/2016 1705   EKG.Sinus rhythm, RBBB and LAFB  CXR. FINDINGS: Cardiomegaly, stable from prior. Mediastinal silhouette within normal limits. Tortuosity the intrathoracic aorta noted. Aortic atherosclerosis.  Lungs hypoinflated. Emphysematous changes with  basilar prominence of the interstitial markings, similar to previous. No focal infiltrates. No pulmonary edema or pleural effusion. No pneumothorax.  No acute osseus abnormality.  IMPRESSION: 1. Emphysema with basilar prominence of the interstitial markings, stable from previous. No other active cardiopulmonary disease. 2. Mild cardiomegaly with aortic atherosclerosis.  Discharge Instructions: Discharge Instructions    Diet - low sodium heart healthy    Complete by:  As directed    Discharge instructions    Complete by:  As directed    It was pleasure taking care of you. Please do not take your blood pressure medication called amlodipine and Lasix until you see your PCP in the clinic. Please try to make an appointment according to your convenience to be seen early next week in the clinic.   Increase activity slowly    Complete  by:  As directed       Signed: Arnetha CourserAmin, Hamlet Lasecki, MD 04/19/2017, 3:51 PM   Pager: [phone]

## 2017-04-19 NOTE — Progress Notes (Signed)
Pt refused to be place in a low bed, stated he's comfortable on the bed that he is on right now. Pt and wife educated on the importance of low bed, verbalized understanding.

## 2017-04-19 NOTE — Consult Note (Signed)
WOC nurse consulted for LE wounds.  Patient with longstanding history of LE wounds related to PVD/PAD.  He was recommended in the past for amputation and refused. Chronic osteomyelitis.  Wife provides care for his LEs.  At the time of my visit today she has just completed care to there LLE, and she is currently dressing the RLE.  He has several areas of ulcerations on the right foot but they are not draining and do not appear infected. She has applied silvadene to all of the areas of concern along with some type of emmollient to the remainder of the leg.  She performs his wound care each day based on the "need for it", based on drainage.   I was not able to view the wounds on the LLE, but based on the review of his chart I have discussed the plan of care with the wife and she would like to continue to provide the care of these wounds based on the fact that he "still has his feet" because of the meticulous care she has provided.   Orders written for wife to provide wound care and dressing changes. Staff to provide gauze as needed.  Wife has silvadene at the bedside and reports she does not need me to request additional to the room   Re consult if needed, will not follow at this time. Thanks  Raylinn Kosar [employer] MSN, RN,CWOCN, CNS [phone])

## 2017-04-19 NOTE — Care Management Obs Status (Signed)
MEDICARE OBSERVATION STATUS NOTIFICATION   Patient Details  Name: Vincent PigeonRobert T Pickering MRN: [id number] Date of Birth: [birth year]   Medicare Observation Status Notification Given:  Yes    Winfrey Chillemi, Annamarie MajorCheryl U, RN 04/19/2017, 12:33 PM

## 2017-04-19 NOTE — Progress Notes (Signed)
   Subjective: Patient was feeling better when seen this morning. He denies any more dizziness.  Objective:  Vital signs in last 24 hours: Vitals:   04/18/17 2115 04/18/17 2157 04/19/17 0619 04/19/17 1027  BP: (!) 141/79 (!) 148/70 112/76 138/60  Pulse: 81 82 83 85  Resp: (!) 22 18 18 17   Temp:  98.1 F (36.7 C) 98.1 F (36.7 C) 98 F (36.7 C)  TempSrc:  Oral Oral Oral  SpO2: 97% 96% 94% 96%  Weight:      Height:       Gen. Alert elderly man, in no acute distress. Lungs. Clear bilaterally. CV. Regular rate and rhythm. Abdomen. Soft, nontender, bowel sounds positive. Extremities. Bilateral clean wounds on his feet and ankles, no discharge, no edema or erythema.  Labs. BMP Latest Ref Rng & Units 04/19/2017 04/18/2017 04/18/2017  Glucose 65 - 99 mg/dL 87 409(W116(H) 119(J115(H)  BUN 6 - 20 mg/dL 47(W34(H) 29(F40(H) 62(Z30(H)  Creatinine 0.61 - 1.24 mg/dL 3.08(M2.29(H) 5.78(I2.50(H) 6.96(E2.40(H)  BUN/Creat Ratio 10 - 24 - - -  Sodium 135 - 145 mmol/L 141 143 142  Potassium 3.5 - 5.1 mmol/L 3.4(L) 3.9 3.7  Chloride 101 - 111 mmol/L 107 107 109  CO2 22 - 32 mmol/L 27 - 25  Calcium 8.9 - 10.3 mg/dL 9.1 - 9.0   Urinalysis    Component Value Date/Time   COLORURINE YELLOW 04/19/2017 0753   APPEARANCEUR CLEAR 04/19/2017 0753   APPEARANCEUR Cloudy (A) 02/15/2016 1705   LABSPEC 1.009 04/19/2017 0753   PHURINE 6.0 04/19/2017 0753   GLUCOSEU NEGATIVE 04/19/2017 0753   HGBUR SMALL (A) 04/19/2017 0753   BILIRUBINUR NEGATIVE 04/19/2017 0753   BILIRUBINUR Negative 02/15/2016 1705   KETONESUR NEGATIVE 04/19/2017 0753   PROTEINUR 30 (A) 04/19/2017 0753   UROBILINOGEN 0.2 03/26/2015 1400   NITRITE NEGATIVE 04/19/2017 0753   LEUKOCYTESUR NEGATIVE 04/19/2017 0753   LEUKOCYTESUR Negative 02/15/2016 1705    Assessment/Plan:  Vincent PigeonRobert T Estupinan is a 83 y.o. gentleman with PMH gout, chronic osteomyelitis, PVD,PAFnot on anticoagualtion, HTN, and CKD3 who presents for hypotension and syncope at home. He was recently  hospitalized for 6 days due to MRSE bacteremia and acute osteomyelitis and left the hospital two days ago on oral antibiotics.  Hypotension. He is normotensive now. -Check orthostatic vitals. -Hold his amlodipine and Lasix-he will need a close follow-up after discharge, his PCP should be able to restart lasix and amlodipine if needed.  AK I on CKD. Most likely due to hypoperfusion in the setting of hypotension. Creatinine started to improve. He should encouraged to increase his fluid intake. He will need a BMP on follow-up.  Osteomyelitis/ MSRE bacteremia. Clinically stable, no sign of active infection. -Continue Augmentin and doxycycline as advised to complete total of 6 weeks.  Dispo: He'll be discharged today.   Arnetha CourserAmin, Virl Coble, MD 04/19/2017, 1:39 PM Pager: [phone]

## 2017-04-19 NOTE — Progress Notes (Signed)
Pt discharge home, discharge instructions and medications discussed and reviewed with pt and wife at bedside, verbalized understanding. Telemetry dc'ed, CCMD notified. IV discontinued, catheter intact, site clean and dry. No concerns or questions at this time. Pt was escorted out of the unit in wheelchair, took all belongings with them.

## 2017-04-26 ENCOUNTER — Ambulatory Visit (INDEPENDENT_AMBULATORY_CARE_PROVIDER_SITE_OTHER): Payer: Medicare Other | Admitting: Internal Medicine

## 2017-04-26 ENCOUNTER — Encounter: Payer: Self-pay | Admitting: Internal Medicine

## 2017-04-26 VITALS — BP 153/67 | HR 74 | Temp 98.1°F | Ht 71.0 in | Wt 160.0 lb

## 2017-04-26 DIAGNOSIS — Z5189 Encounter for other specified aftercare: Secondary | ICD-10-CM | POA: Diagnosis present

## 2017-04-26 DIAGNOSIS — N183 Chronic kidney disease, stage 3 unspecified: Secondary | ICD-10-CM

## 2017-04-26 DIAGNOSIS — I480 Paroxysmal atrial fibrillation: Secondary | ICD-10-CM | POA: Diagnosis not present

## 2017-04-26 DIAGNOSIS — Z87891 Personal history of nicotine dependence: Secondary | ICD-10-CM | POA: Diagnosis not present

## 2017-04-26 DIAGNOSIS — I739 Peripheral vascular disease, unspecified: Secondary | ICD-10-CM | POA: Diagnosis not present

## 2017-04-26 DIAGNOSIS — I129 Hypertensive chronic kidney disease with stage 1 through stage 4 chronic kidney disease, or unspecified chronic kidney disease: Secondary | ICD-10-CM

## 2017-04-26 DIAGNOSIS — M86171 Other acute osteomyelitis, right ankle and foot: Secondary | ICD-10-CM | POA: Diagnosis not present

## 2017-04-26 DIAGNOSIS — M86671 Other chronic osteomyelitis, right ankle and foot: Secondary | ICD-10-CM | POA: Diagnosis not present

## 2017-04-26 DIAGNOSIS — I959 Hypotension, unspecified: Secondary | ICD-10-CM

## 2017-04-26 DIAGNOSIS — Z79899 Other long term (current) drug therapy: Secondary | ICD-10-CM

## 2017-04-26 DIAGNOSIS — Z7982 Long term (current) use of aspirin: Secondary | ICD-10-CM | POA: Diagnosis not present

## 2017-04-26 DIAGNOSIS — I10 Essential (primary) hypertension: Secondary | ICD-10-CM

## 2017-04-26 DIAGNOSIS — I951 Orthostatic hypotension: Secondary | ICD-10-CM

## 2017-04-26 NOTE — Patient Instructions (Addendum)
Thank you for visiting clinic today. I'm glad you are doing very well. Please keep holding your amlodipine and daily Lasix. You can use Lasix if your legs swell up or you are becoming little short of breath, please check your blood pressure before taking Lasix. Please take your potassium along with  Lasix.  You can also call clinic for further instructions and management if needed. We will check your electrolytes and kidney functions today, we will call you with any abnormal results. Please continue the wound care as you are doing now. Please continue your antibiotics till to complete all of them.

## 2017-04-26 NOTE — Assessment & Plan Note (Signed)
BP Readings from Last 3 Encounters:  04/26/17 (!) 153/67  04/19/17 138/60  04/16/17 (!) 164/85   Blood pressure is at an acceptable range for his age. According to wife blood pressure remained in 140s to 150s at home.  -Continue to hold amlodipine. -Continue to hold Lasix-I advised the wife that she can give him a dose of Lasix along with potassium if needed for worsening lower extremity edema.

## 2017-04-26 NOTE — Assessment & Plan Note (Signed)
He was recently admitted because of acute on chronic osteomyelitis with a new lesion on right medial malleolus. He was given IV cefepime and vancomycin for one week, later transitioned to Augmentin and doxycycline for 5 more weeks on discharge.  His wounds are healing, he has no other systemic signs of infection. Overall he is doing great.  -Continue Augmentin and doxycycline to complete a full 5 weeks, which will be till July 2.

## 2017-04-26 NOTE — Progress Notes (Signed)
   CC: For hospital follow-up, admitted for acute and chronic osteomyelitis and hypotension.  HPI:  Vincent Miranda is a 83 y.o. with past medical history as listed below came to clinic for his hospital follow-up.  He was recently admitted twice in the hospital was from May 22 -28 with sepsis due to acute on chronic osteomyelitis, treated with cefepime and vancomycin for one week and discharged on Augmentin and doxycycline for 5 more weeks, total of 6 weeks. During that hospitalization his blood pressure remained elevated his home dose of amlodipine was increased from 5 mg to 7.5 mg and he was asked to continue his daily dose of Lasix 40 mg.  He was readmitted on 04/18/2017 after his wife checked his blood pressure and found to be low in 90/ 50s along with complaint of dizziness. His blood pressure improved with a 500 mL bolus. He was discharged next day and was told to hold his amlodipine and lasix.  Since then he is doing very well, according to wife blood pressure remained in 140-150 range. Denies any dizziness, no lower extremity swelling or dyspnea. He denies any abdominal discomfort or diarrhea because of antibiotics. His wounds are healing well, his wife continue to do daily dressing. His appetite is normal.   Past Medical History:  Diagnosis Date  . Acute blood loss anemia 10/18/2013  . Anemia of chronic disease   . Atrial fibrillation (HCC)   . Chronic kidney disease   . Chronic osteomyelitis (HCC)   . Gout   . History of blood transfusion   . Hypertension   . PVD (peripheral vascular disease) (HCC)     Review of Systems:  As per HPI.  Physical Exam:  Vitals:   04/26/17 1026  BP: (!) 153/67  Pulse: 74  Temp: 98.1 F (36.7 C)  TempSrc: Oral  SpO2: 100%  Weight: 160 lb (72.6 kg)  Height: 5\' 11"  (1.803 m)    General: Vital signs reviewed.  Patient is well-developed and well-nourished, in no acute distress and cooperative with exam.  Cardiovascular: RRR, S1  normal, S2 normal, no murmurs, gallops, or rubs. Pulmonary/Chest: Clear to auscultation bilaterally, no wheezes, rales, or rhonchi. Abdominal: Soft, non-tender, non-distended, BS +, no masses, organomegaly, or guarding present.  Extremities: Trace lower extremity edema bilaterally, Multiple healing wound bilaterally, no active drainage.   Psychiatric: Normal mood and affect. speech and behavior is normal. Cognition and memory are normal.  Assessment & Plan:   See Encounters Tab for problem based charting.  Patient discussed with Dr. Criselda PeachesMullen.

## 2017-04-26 NOTE — Assessment & Plan Note (Signed)
He was having regular rate and rhythm today. Denies any palpitations or chest pain. -His wife has declined in the past any anticoagulation or rate control medication.  -Continue aspirin 81 mg daily.

## 2017-04-26 NOTE — Assessment & Plan Note (Signed)
He did had mild AK I during hospitalization most likely due to hypoperfusion. His kidney functions were improving on discharge.  -Recheck BMP to see if creatinine has been back to his baseline.

## 2017-04-26 NOTE — Assessment & Plan Note (Signed)
He had just 1 episode of hypotension which prompted an other hospital admission. Since his antihypertensives were held, he did not had any more episode of hypotension or dizziness. His blood pressure remains systolic 140s to 098J150s, which is a acceptable range for his age and co morbidities.  -Continue to hold antihypertensives.

## 2017-04-26 NOTE — Assessment & Plan Note (Signed)
He has chronic ulcers on his both feet, on multiple stages of healing. They appear clean and noninfected currently.  Patient's wife is taking good care of them.  -Continue wound care as it was done before.

## 2017-04-27 LAB — BMP8+ANION GAP
Anion Gap: 14 mmol/L (ref 10.0–18.0)
BUN / CREAT RATIO: 18 (ref 10–24)
BUN: 31 mg/dL (ref 10–36)
CALCIUM: 10 mg/dL (ref 8.6–10.2)
CO2: 23 mmol/L (ref 18–29)
CREATININE: 1.69 mg/dL — AB (ref 0.76–1.27)
Chloride: 111 mmol/L — ABNORMAL HIGH (ref 96–106)
GFR calc non Af Amer: 35 mL/min/{1.73_m2} — ABNORMAL LOW (ref >59)
GFR, EST AFRICAN AMERICAN: 40 mL/min/{1.73_m2} — AB (ref >59)
Glucose: 91 mg/dL (ref 65–99)
Potassium: 3.9 mmol/L (ref 3.5–5.2)
Sodium: 148 mmol/L — ABNORMAL HIGH (ref 134–144)

## 2017-04-27 NOTE — Progress Notes (Signed)
Internal Medicine Clinic Attending  Case discussed with Dr. [name] at the time of the visit.  We reviewed the resident's history and exam and pertinent patient test results.  I agree with the assessment, diagnosis, and plan of care documented in the resident's note.    

## 2017-04-30 ENCOUNTER — Ambulatory Visit: Payer: Medicare Other

## 2017-05-08 ENCOUNTER — Other Ambulatory Visit: Payer: Self-pay | Admitting: Internal Medicine

## 2017-05-08 DIAGNOSIS — M1000 Idiopathic gout, unspecified site: Secondary | ICD-10-CM

## 2017-05-22 ENCOUNTER — Other Ambulatory Visit: Payer: Self-pay | Admitting: Internal Medicine

## 2017-06-04 ENCOUNTER — Other Ambulatory Visit: Payer: Self-pay | Admitting: Internal Medicine

## 2017-06-06 ENCOUNTER — Other Ambulatory Visit: Payer: Self-pay | Admitting: Internal Medicine

## 2017-06-18 ENCOUNTER — Other Ambulatory Visit: Payer: Self-pay | Admitting: Internal Medicine

## 2017-07-11 ENCOUNTER — Ambulatory Visit (HOSPITAL_COMMUNITY)
Admission: RE | Admit: 2017-07-11 | Discharge: 2017-07-11 | Disposition: A | Discharge status: Home / Self Care | Payer: Medicare Other | Source: Ambulatory Visit | Attending: Internal Medicine | Admitting: Internal Medicine

## 2017-07-11 ENCOUNTER — Ambulatory Visit (INDEPENDENT_AMBULATORY_CARE_PROVIDER_SITE_OTHER): Payer: Medicare Other | Admitting: Internal Medicine

## 2017-07-11 ENCOUNTER — Encounter: Payer: Self-pay | Admitting: Internal Medicine

## 2017-07-11 VITALS — BP 157/84 | HR 93 | Temp 98.6°F | Ht 70.0 in | Wt 170.0 lb

## 2017-07-11 DIAGNOSIS — E43 Unspecified severe protein-calorie malnutrition: Secondary | ICD-10-CM | POA: Diagnosis not present

## 2017-07-11 DIAGNOSIS — Z89412 Acquired absence of left great toe: Secondary | ICD-10-CM | POA: Insufficient documentation

## 2017-07-11 DIAGNOSIS — M11261 Other chondrocalcinosis, right knee: Secondary | ICD-10-CM | POA: Insufficient documentation

## 2017-07-11 DIAGNOSIS — Z6824 Body mass index (BMI) 24.0-24.9, adult: Secondary | ICD-10-CM | POA: Insufficient documentation

## 2017-07-11 DIAGNOSIS — Z7982 Long term (current) use of aspirin: Secondary | ICD-10-CM

## 2017-07-11 DIAGNOSIS — Z89422 Acquired absence of other left toe(s): Secondary | ICD-10-CM

## 2017-07-11 DIAGNOSIS — M8660 Other chronic osteomyelitis, unspecified site: Secondary | ICD-10-CM | POA: Diagnosis not present

## 2017-07-11 DIAGNOSIS — M19071 Primary osteoarthritis, right ankle and foot: Secondary | ICD-10-CM | POA: Insufficient documentation

## 2017-07-11 DIAGNOSIS — I739 Peripheral vascular disease, unspecified: Secondary | ICD-10-CM | POA: Diagnosis not present

## 2017-07-11 DIAGNOSIS — L97522 Non-pressure chronic ulcer of other part of left foot with fat layer exposed: Secondary | ICD-10-CM

## 2017-07-11 DIAGNOSIS — L03115 Cellulitis of right lower limb: Secondary | ICD-10-CM | POA: Insufficient documentation

## 2017-07-11 DIAGNOSIS — Z8619 Personal history of other infectious and parasitic diseases: Secondary | ICD-10-CM

## 2017-07-11 DIAGNOSIS — Z89201 Acquired absence of right upper limb, unspecified level: Secondary | ICD-10-CM | POA: Diagnosis not present

## 2017-07-11 DIAGNOSIS — M869 Osteomyelitis, unspecified: Secondary | ICD-10-CM | POA: Diagnosis not present

## 2017-07-11 DIAGNOSIS — Z87891 Personal history of nicotine dependence: Secondary | ICD-10-CM | POA: Diagnosis not present

## 2017-07-11 DIAGNOSIS — I10 Essential (primary) hypertension: Secondary | ICD-10-CM | POA: Diagnosis not present

## 2017-07-11 DIAGNOSIS — Z89421 Acquired absence of other right toe(s): Secondary | ICD-10-CM

## 2017-07-11 DIAGNOSIS — L97512 Non-pressure chronic ulcer of other part of right foot with fat layer exposed: Secondary | ICD-10-CM | POA: Diagnosis not present

## 2017-07-11 DIAGNOSIS — L98492 Non-pressure chronic ulcer of skin of other sites with fat layer exposed: Secondary | ICD-10-CM

## 2017-07-11 NOTE — Assessment & Plan Note (Addendum)
Patient has a long-standing history of chronic osteomyelitis with intermittent  acute exacerbations. During his previous hospitalization he was found to have a new lesion in his right medial malleolus which was inadequately treated as wife terminated that treatment in 3 weeks instead of 6.  He had an other acute exacerbation now where he experience worsening  cellulitis and draining wounds. Patient do not want to have an MRI. Plan was to get a new bone biopsy to guide [place] for a better antibiotic therapy. Patient's wife just want to start him on IV antibiotics and very upset for all these testing. Patient is hemodynamically stable, afebrile. Initially the plan was to perform these testing as an outpatient, after having a long conversation with his upset wife who was more interested in admission, we offered admission and also warned them that it will be as an observation as he does not meet criteria for an admission at this time,and might cause some extra financial burden on the family. We also explained that keeping him in the hospital can expedite his investigations and starting of appropriate antibiotics. After spending more than an hour all of a sudden patient refused admission stating that he never made his mind yet for an admission and wants to go home.  -We ordered x-rays for bilateral legs and feet. -We might need to get an CT scan as patient do not want an MRI if needed for biopsy. -We will contact orthopedic surgery tomorrow to see if they can perform a biopsy for [place]. Patient's wife do not want [place] to contact Dr. Sharol Given. We'll might have to consult with IR for a biopsy. -he was provided with clean bandage for his wounds. -Labs for CBC, CMP, CRP and ESR was ordered. -We will call the patient tomorrow once have some lab work and imaging results back.  Addendum. Patient's x-rays shows an active osteomyelitis in the head of right fourth metatarsal. I called the patient and his wife with the results and  offered them an option of doing a bone biopsy as an outpatient versus inpatient to expedite start treatment. His wife states that she wants to have it done as an outpatient and then comes to the hospital for IV antibiotics afterwards. Talked with radiology-they want me to place an order for ultrasound biopsy and later radiologist will review whether it will be under ultrasound or CT-guided. Order was placed.

## 2017-07-11 NOTE — Assessment & Plan Note (Signed)
BP Readings from Last 3 Encounters:  07/11/17 (!) 157/84  04/26/17 (!) 153/67  04/19/17 138/60   His blood pressure was elevated today. According to wife whenever she checked at home it remained between 130 to 140 systolic, which is within his goal.  Continue observing without any antihypertensives.

## 2017-07-11 NOTE — Patient Instructions (Addendum)
Thank you for visiting clinic today. As we had a long discussion today and we were willing to keep you in the hospital to expedite the testing and starting of treatment. According to your wishes to go home, we will do lab work and x-ray today and tried to make an appointment for your ultrasound and biopsy as soon as possible so we can start you on appropriate antibiotics. We will call you as soon as we have some of your results back and know about your appointments. Please keep the wound dry by a daily changing dressing. We will avoid antibiotics for a few days until we can get an biopsy.

## 2017-07-11 NOTE — Assessment & Plan Note (Signed)
Patient has a long-standing health history of non-healing bilateral multiple wounds in his feet. His wife is taking care of his wounds.  I offered them today for your referral to wound care center which she declined stating that she had a bad experience in the past with wound care centers and do not want to go back there. According to his wife she is  comfortable taking care of his wound at this time and will continue to do so by herself.

## 2017-07-11 NOTE — Assessment & Plan Note (Signed)
According to previous testing he has mildly reduced arterial flow on the right and moderatly reduced blood flow in left leg. Patient has nonhealing bilateral multiple ulcers for more than 2 years. He was offered amputation in the past which he and his wife both declined, they do not want to talk about those lines at this time. They just want his reoccurrence of cellulitis and worsening wound treated with antibiotics. We did discuss with them to obtain a new set of ABI and consult vascular surgery if any intervention can be helpful. Initially wife was very resistant for any new testing stating that we know that his blood supply is low in his legs and what difference does it make in his age. After explaining to her how increased in blood supply if possible can help him heal his chronic wounds she agreed with ultrasound ABI.  -order was placed for ultrasound ABI.-Plan is to discuss with vascular surgery afterwards to look for any possible intervention.

## 2017-07-11 NOTE — Progress Notes (Signed)
   CC: worsening of his chronic lower extremity ulcers.  HPI:  [name] is a 83 y.o. elderly man with past medical history as listed below came to the clinic with his wife with worsening of his chronic lower extremity wounds for couple of weeks. Patient has an history of chronic osteomyelitis and peripheral vascular disease resulting in nonhealing wound, his most recent admission was in May 2018 with a new site of osteomyelitis and he was admitted for bacteremia and sepsis.he was treated with cefepime and vancomycin for 7 days and discharged home with doxycycline and Augmentin for another 5 more weeks. Patient took approximately 3 more weeks of antibiotics, by stopped giving him antibiotics stating that he was losing his appetite and not feeling good with them. Once she stopped the antibiotics patient's appetite and generalized well being improved and he was feeling much better. His wound started to heal very well. She continued to do daily dressing. Her lower extremity swelling was completely resolved. Couple of weeks ago he started getting worsening lower extremity edema along with reopening of his healing wound which was also associated with foul-smelling, pustular discharge. By started him back on doxycycline as she had did left over from previous the unfinished course, according to wife it does not seem working and his swelling and wounds continued to get worse to the point that he was unable to walk around or stand up on his feet. She also noticed mild decrease in his appetite. They denied any fever or chills. No nausea or vomiting. He does not experience any pain.  Past Medical History:  Diagnosis Date  . Acute blood loss anemia 10/18/2013  . Anemia of chronic disease   . Atrial fibrillation (HCC)   . Chronic kidney disease   . Chronic osteomyelitis (HCC)   . Gout   . History of blood transfusion   . Hypertension   . PVD (peripheral vascular disease) (HCC)    Review of   System. As per HPI. Marland Kitchen          Physical Exam:  Vitals:   07/11/17 1411  BP: (!) 157/84  Pulse: 93  Temp: 98.6 F (37 C)  TempSrc: Oral  SpO2: 96%  Weight: 170 lb (77.1 kg)  Height: 5\' 10"  (1.778 m)    General: Vital signs reviewed.  Patient is well-developed and well-nourished, in no acute distress and cooperative with exam.  Head: Normocephalic and atraumatic. Eyes: EOMI, conjunctivae normal, no scleral icterus.  Cardiovascular: RRR, S1 normal, S2 normal, no murmurs, gallops, or rubs. Pulmonary/Chest: Clear to auscultation bilaterally, no wheezes, rales, or rhonchi. Abdominal: Soft, non-tender, non-distended, BS +, no masses, organomegaly, or guarding present.  Extremities: bilateral lower extremity erythema and edema more pronounced on left leg multiple open wounds with mal odorous pustular drainage. There was amputation of multiple toes bilaterally. Psychiatric: Normal mood and affect.   Assessment & Plan:   See Encounters Tab for problem based charting.  Patient seen with Dr. Cleda Daub.

## 2017-07-12 ENCOUNTER — Other Ambulatory Visit: Payer: Self-pay | Admitting: Internal Medicine

## 2017-07-12 DIAGNOSIS — N183 Chronic kidney disease, stage 3 unspecified: Secondary | ICD-10-CM

## 2017-07-12 LAB — CBC WITH DIFFERENTIAL/PLATELET
BASOS ABS: 0.1 10*3/uL (ref 0.0–0.2)
Basos: 1 %
EOS (ABSOLUTE): 0.6 10*3/uL — AB (ref 0.0–0.4)
EOS: 7 %
Hematocrit: 24.2 % — ABNORMAL LOW (ref 37.5–51.0)
Hemoglobin: 7.3 g/dL — ABNORMAL LOW (ref 13.0–17.7)
IMMATURE GRANS (ABS): 0 10*3/uL (ref 0.0–0.1)
IMMATURE GRANULOCYTES: 0 %
LYMPHS: 18 %
Lymphocytes Absolute: 1.5 10*3/uL (ref 0.7–3.1)
MCH: 23.5 pg — ABNORMAL LOW (ref 26.6–33.0)
MCHC: 30.2 g/dL — ABNORMAL LOW (ref 31.5–35.7)
MCV: 78 fL — ABNORMAL LOW (ref 79–97)
MONOS ABS: 0.7 10*3/uL (ref 0.1–0.9)
Monocytes: 8 %
NEUTROS PCT: 66 %
Neutrophils Absolute: 5.7 10*3/uL (ref 1.4–7.0)
PLATELETS: 412 10*3/uL — AB (ref 150–379)
RBC: 3.1 x10E6/uL — ABNORMAL LOW (ref 4.14–5.80)
RDW: 18.2 % — AB (ref 12.3–15.4)
WBC: 8.5 10*3/uL (ref 3.4–10.8)

## 2017-07-12 LAB — CMP14 + ANION GAP
ALK PHOS: 90 IU/L (ref 39–117)
ALT: 3 IU/L (ref 0–44)
ANION GAP: 14 mmol/L (ref 10.0–18.0)
AST: 10 IU/L (ref 0–40)
Albumin/Globulin Ratio: 0.7 — ABNORMAL LOW (ref 1.2–2.2)
Albumin: 3 g/dL — ABNORMAL LOW (ref 3.2–4.6)
BUN/Creatinine Ratio: 12 (ref 10–24)
BUN: 18 mg/dL (ref 10–36)
Bilirubin Total: <0.2 mg/dL (ref 0.0–1.2)
CO2: 23 mmol/L (ref 20–29)
Calcium: 9.5 mg/dL (ref 8.6–10.2)
Chloride: 105 mmol/L (ref 96–106)
Creatinine, Ser: 1.46 mg/dL — ABNORMAL HIGH (ref 0.76–1.27)
GFR calc Af Amer: 48 mL/min/{1.73_m2} — ABNORMAL LOW (ref >59)
GFR, EST NON AFRICAN AMERICAN: 42 mL/min/{1.73_m2} — AB (ref >59)
GLOBULIN, TOTAL: 4.5 g/dL (ref 1.5–4.5)
GLUCOSE: 88 mg/dL (ref 65–99)
Potassium: 4.5 mmol/L (ref 3.5–5.2)
SODIUM: 142 mmol/L (ref 134–144)
Total Protein: 7.5 g/dL (ref 6.0–8.5)

## 2017-07-12 LAB — SEDIMENTATION RATE: SED RATE: 64 mm/h — AB (ref 0–30)

## 2017-07-12 LAB — C-REACTIVE PROTEIN: CRP: 27.9 mg/L — ABNORMAL HIGH (ref 0.0–4.9)

## 2017-07-12 NOTE — Addendum Note (Signed)
Addended by: Arnetha Courser on: 07/12/2017 01:20 PM   Modules accepted: Orders

## 2017-07-12 NOTE — Addendum Note (Signed)
Addended by: Arnetha Courser on: 07/12/2017 10:36 AM   Modules accepted: Orders

## 2017-07-13 ENCOUNTER — Other Ambulatory Visit: Payer: Self-pay | Admitting: Internal Medicine

## 2017-07-13 DIAGNOSIS — N183 Chronic kidney disease, stage 3 unspecified: Secondary | ICD-10-CM

## 2017-07-15 NOTE — Progress Notes (Signed)
Internal Medicine Clinic Attending  I saw and evaluated the patient.  I personally confirmed the key portions of the history and exam documented by Dr. Nelson Chimes and I reviewed pertinent patient test results.  The assessment, diagnosis, and plan were formulated together and I agree with the documentation in the resident's note. Vincent Miranda has had non healing ulcers of bilateral lower extremities dating back to about 2014.  He has previously been evaluated by Orthopaedics -Dr Lajoyce Corners who recommended amputation but patient and wife adamantly declined.  Wife has had a "bad experience" [facility] and refuses to be referred there.  Most recently he has gotten repeated antibiotic treatments that usually start with IV antibiotics and then transtion to oral antibiotics.  Wife has mostly been adjusting when he takes these antibiotics herself.  Overall it appears that Augmentin was causing him some nausea, she stopped this antibiotic as well as doxycycline a few weeks ago.  Over the past 1-2 weeks he has had worsening of his ulcer and she restarted doxycline without improvement.  Additionally it should be noted that he has bilateral PAD that was last objectively quantified with U/S abi in 2016, by my review and history taking this was never interveened on.  Patients wife has strong opionions about his treatment I layed out several options.  It apeared at first she wanted IV antibiotics with out additional workup however Vincent Miranda was not agreeable to hospitalization at this time.  Patient had no sgins of instabiity, I was not able to note any active drainage from the wounds and overall they appeared similar to other photographic documentation.  I recommened obtaining Bx to help Abx considerations and repeat vascular u/s with possible intervention that may promote wound healing.  Patient and wife were agreeable to this plan.  However wife called back on Friday again concerned. Dr Nelson Chimes worked to get patient in  with IR for biopsy but we have received conflicting information if they will do this as outpatinet, it appears that they may want ortho consult instead (wife does not want to see ortho due to previous amputation recommendation.  I offered admission to expite workup but patient and wife have plans for the weekend, we discussed he could be admitted on Monday to obtian ABI and biopsy.

## 2017-07-16 ENCOUNTER — Ambulatory Visit (HOSPITAL_COMMUNITY): Admission: RE | Admit: 2017-07-16 | Payer: Medicare Other | Source: Ambulatory Visit

## 2017-07-16 ENCOUNTER — Encounter (HOSPITAL_COMMUNITY): Payer: Self-pay | Admitting: General Practice

## 2017-07-16 ENCOUNTER — Inpatient Hospital Stay (HOSPITAL_COMMUNITY)
Admission: RE | Admit: 2017-07-16 | Discharge: 2017-07-24 | DRG: 300 | Disposition: A | Discharge status: Home / Self Care | Payer: Medicare Other | Source: Ambulatory Visit | Attending: Internal Medicine | Admitting: Internal Medicine

## 2017-07-16 DIAGNOSIS — R0602 Shortness of breath: Secondary | ICD-10-CM

## 2017-07-16 DIAGNOSIS — D509 Iron deficiency anemia, unspecified: Secondary | ICD-10-CM | POA: Diagnosis not present

## 2017-07-16 DIAGNOSIS — N183 Chronic kidney disease, stage 3 unspecified: Secondary | ICD-10-CM | POA: Diagnosis present

## 2017-07-16 DIAGNOSIS — E876 Hypokalemia: Secondary | ICD-10-CM | POA: Diagnosis not present

## 2017-07-16 DIAGNOSIS — L97919 Non-pressure chronic ulcer of unspecified part of right lower leg with unspecified severity: Secondary | ICD-10-CM | POA: Diagnosis present

## 2017-07-16 DIAGNOSIS — D638 Anemia in other chronic diseases classified elsewhere: Secondary | ICD-10-CM | POA: Diagnosis present

## 2017-07-16 DIAGNOSIS — D631 Anemia in chronic kidney disease: Secondary | ICD-10-CM | POA: Diagnosis not present

## 2017-07-16 DIAGNOSIS — M86671 Other chronic osteomyelitis, right ankle and foot: Secondary | ICD-10-CM | POA: Diagnosis present

## 2017-07-16 DIAGNOSIS — M109 Gout, unspecified: Secondary | ICD-10-CM | POA: Diagnosis present

## 2017-07-16 DIAGNOSIS — Z89421 Acquired absence of other right toe(s): Secondary | ICD-10-CM | POA: Diagnosis not present

## 2017-07-16 DIAGNOSIS — M86672 Other chronic osteomyelitis, left ankle and foot: Secondary | ICD-10-CM | POA: Diagnosis not present

## 2017-07-16 DIAGNOSIS — N179 Acute kidney failure, unspecified: Secondary | ICD-10-CM | POA: Diagnosis present

## 2017-07-16 DIAGNOSIS — M8669 Other chronic osteomyelitis, multiple sites: Secondary | ICD-10-CM | POA: Diagnosis not present

## 2017-07-16 DIAGNOSIS — R600 Localized edema: Secondary | ICD-10-CM | POA: Diagnosis not present

## 2017-07-16 DIAGNOSIS — I4891 Unspecified atrial fibrillation: Secondary | ICD-10-CM | POA: Diagnosis present

## 2017-07-16 DIAGNOSIS — L03116 Cellulitis of left lower limb: Secondary | ICD-10-CM | POA: Diagnosis present

## 2017-07-16 DIAGNOSIS — M868X7 Other osteomyelitis, ankle and foot: Secondary | ICD-10-CM | POA: Diagnosis not present

## 2017-07-16 DIAGNOSIS — L03115 Cellulitis of right lower limb: Secondary | ICD-10-CM | POA: Diagnosis present

## 2017-07-16 DIAGNOSIS — Z87891 Personal history of nicotine dependence: Secondary | ICD-10-CM

## 2017-07-16 DIAGNOSIS — I129 Hypertensive chronic kidney disease with stage 1 through stage 4 chronic kidney disease, or unspecified chronic kidney disease: Secondary | ICD-10-CM | POA: Diagnosis present

## 2017-07-16 DIAGNOSIS — M86171 Other acute osteomyelitis, right ankle and foot: Secondary | ICD-10-CM | POA: Diagnosis not present

## 2017-07-16 DIAGNOSIS — L97929 Non-pressure chronic ulcer of unspecified part of left lower leg with unspecified severity: Secondary | ICD-10-CM | POA: Diagnosis present

## 2017-07-16 DIAGNOSIS — I739 Peripheral vascular disease, unspecified: Secondary | ICD-10-CM | POA: Diagnosis present

## 2017-07-16 DIAGNOSIS — Z9981 Dependence on supplemental oxygen: Secondary | ICD-10-CM

## 2017-07-16 DIAGNOSIS — Z9889 Other specified postprocedural states: Secondary | ICD-10-CM | POA: Diagnosis not present

## 2017-07-16 DIAGNOSIS — M86172 Other acute osteomyelitis, left ankle and foot: Secondary | ICD-10-CM | POA: Diagnosis not present

## 2017-07-16 DIAGNOSIS — E860 Dehydration: Secondary | ICD-10-CM | POA: Diagnosis present

## 2017-07-16 DIAGNOSIS — L0889 Other specified local infections of the skin and subcutaneous tissue: Secondary | ICD-10-CM | POA: Diagnosis not present

## 2017-07-16 DIAGNOSIS — L0390 Cellulitis, unspecified: Secondary | ICD-10-CM | POA: Diagnosis present

## 2017-07-16 DIAGNOSIS — Z89422 Acquired absence of other left toe(s): Secondary | ICD-10-CM | POA: Diagnosis not present

## 2017-07-16 DIAGNOSIS — M869 Osteomyelitis, unspecified: Secondary | ICD-10-CM | POA: Diagnosis present

## 2017-07-16 DIAGNOSIS — I70203 Unspecified atherosclerosis of native arteries of extremities, bilateral legs: Secondary | ICD-10-CM | POA: Diagnosis not present

## 2017-07-16 DIAGNOSIS — M6258 Muscle wasting and atrophy, not elsewhere classified, other site: Secondary | ICD-10-CM | POA: Diagnosis not present

## 2017-07-16 LAB — CBC WITH DIFFERENTIAL/PLATELET
Basophils Absolute: 0 10*3/uL (ref 0.0–0.1)
Basophils Relative: 1 %
EOS PCT: 8 %
Eosinophils Absolute: 0.5 10*3/uL (ref 0.0–0.7)
HCT: 22.1 % — ABNORMAL LOW (ref 39.0–52.0)
Hemoglobin: 6.8 g/dL — CL (ref 13.0–17.0)
LYMPHS ABS: 1.3 10*3/uL (ref 0.7–4.0)
LYMPHS PCT: 20 %
MCH: 23.6 pg — AB (ref 26.0–34.0)
MCHC: 30.8 g/dL (ref 30.0–36.0)
MCV: 76.7 fL — AB (ref 78.0–100.0)
MONO ABS: 0.5 10*3/uL (ref 0.1–1.0)
MONOS PCT: 8 %
Neutro Abs: 4.1 10*3/uL (ref 1.7–7.7)
Neutrophils Relative %: 64 %
PLATELETS: 290 10*3/uL (ref 150–400)
RBC: 2.88 MIL/uL — AB (ref 4.22–5.81)
RDW: 17.4 % — ABNORMAL HIGH (ref 11.5–15.5)
WBC: 6.5 10*3/uL (ref 4.0–10.5)

## 2017-07-16 LAB — COMPREHENSIVE METABOLIC PANEL
ALT: 6 U/L — AB (ref 17–63)
ANION GAP: 6 (ref 5–15)
AST: 12 U/L — ABNORMAL LOW (ref 15–41)
Albumin: 2.2 g/dL — ABNORMAL LOW (ref 3.5–5.0)
Alkaline Phosphatase: 73 U/L (ref 38–126)
BUN: 17 mg/dL (ref 6–20)
CALCIUM: 9.2 mg/dL (ref 8.9–10.3)
CO2: 26 mmol/L (ref 22–32)
Chloride: 109 mmol/L (ref 101–111)
Creatinine, Ser: 1.56 mg/dL — ABNORMAL HIGH (ref 0.61–1.24)
GFR calc Af Amer: 43 mL/min — ABNORMAL LOW (ref >60)
GFR calc non Af Amer: 37 mL/min — ABNORMAL LOW (ref >60)
GLUCOSE: 99 mg/dL (ref 65–99)
Potassium: 4.1 mmol/L (ref 3.5–5.1)
Sodium: 141 mmol/L (ref 135–145)
TOTAL PROTEIN: 6.9 g/dL (ref 6.5–8.1)
Total Bilirubin: 0.3 mg/dL (ref 0.3–1.2)

## 2017-07-16 LAB — PROTIME-INR
INR: 1.03
Prothrombin Time: 13.5 seconds (ref 11.4–15.2)

## 2017-07-16 LAB — PREPARE RBC (CROSSMATCH)

## 2017-07-16 MED ORDER — ALLOPURINOL 100 MG PO TABS
200.0000 mg | ORAL_TABLET | Freq: Every day | ORAL | Status: DC
  Administered 2017-07-17 – 2017-07-24 (×8): 200 mg via ORAL
  Filled 2017-07-16 (×9): qty 2

## 2017-07-16 MED ORDER — ACETAMINOPHEN 650 MG RE SUPP
650.0000 mg | Freq: Four times a day (QID) | RECTAL | Status: DC | PRN
Start: 2017-07-16 — End: 2017-07-24

## 2017-07-16 MED ORDER — ENOXAPARIN SODIUM 40 MG/0.4ML ~~LOC~~ SOLN
40.0000 mg | SUBCUTANEOUS | Status: DC
  Administered 2017-07-21: 40 mg via SUBCUTANEOUS
  Filled 2017-07-16 (×8): qty 0.4

## 2017-07-16 MED ORDER — ASPIRIN EC 81 MG PO TBEC
81.0000 mg | DELAYED_RELEASE_TABLET | Freq: Every day | ORAL | Status: DC
  Administered 2017-07-17 – 2017-07-24 (×8): 81 mg via ORAL
  Filled 2017-07-16 (×9): qty 1

## 2017-07-16 MED ORDER — SILVER SULFADIAZINE 1 % EX CREA
TOPICAL_CREAM | Freq: Every day | CUTANEOUS | Status: DC
  Administered 2017-07-17 – 2017-07-23 (×8): via TOPICAL
  Administered 2017-07-24: 1 via TOPICAL
  Filled 2017-07-16: qty 85

## 2017-07-16 MED ORDER — ALBUTEROL SULFATE (2.5 MG/3ML) 0.083% IN NEBU: 2.5000 mg | INHALATION_SOLUTION | Freq: Four times a day (QID) | RESPIRATORY_TRACT | Status: DC | PRN

## 2017-07-16 MED ORDER — SODIUM CHLORIDE 0.9 % IV SOLN
Freq: Once | INTRAVENOUS | Status: AC
  Administered 2017-07-16: 21:00:00 via INTRAVENOUS

## 2017-07-16 MED ORDER — ACETAMINOPHEN 325 MG PO TABS
650.0000 mg | ORAL_TABLET | Freq: Four times a day (QID) | ORAL | Status: DC | PRN
  Administered 2017-07-16: 650 mg via ORAL
  Filled 2017-07-16: qty 2

## 2017-07-16 MED ORDER — FUROSEMIDE 40 MG PO TABS
40.0000 mg | ORAL_TABLET | Freq: Every day | ORAL | Status: DC
  Administered 2017-07-16 – 2017-07-20 (×5): 40 mg via ORAL
  Filled 2017-07-16 (×6): qty 1

## 2017-07-16 MED ORDER — HYDRALAZINE HCL 20 MG/ML IJ SOLN: 5.0000 mg | Freq: Four times a day (QID) | INTRAMUSCULAR | Status: DC | PRN

## 2017-07-16 NOTE — Progress Notes (Signed)
Admitted from home with active non healing wound to both foot. MD made aware of the admission. Oriented to unit and staff. Will continue to monitor.

## 2017-07-16 NOTE — H&P (Signed)
Date: 07/16/2017               Patient Name:  Vincent Miranda MRN: [id number]  DOB: 1925-04-01 Age / Sex: 83 y.o., male   PCP: Jule Ser, DO         Medical Service: Internal Medicine Teaching Service         Attending Physician: Dr. Evette Doffing, Mallie Mussel, *    First Contact: Dr. Johny Chess Pager: [phone]  Second Contact: Dr. Juleen China Pager: [phone]       After Hours (After 5p/  First Contact Pager: [phone]  weekends / holidays): Second Contact Pager: [phone]   Chief Complaint: Chronic LE Wound  History of Present Illness: Mr. Eastham is a 83 yo M with history of chronic osteomyelitis and LE wounds, PVD, CKD, HTN, a-fib who presents as a direct admission for chronic LE wounds. The majority of history was obtained from the pt's wife and chart review.   He has a several year history of multiple non-healing wounds and osteomyelitis to his bilateral feet and ankles, resulting in multiple toe amputations. He was admitted 04/10/17 for bacteremia with active osteomyelitis of his R medial malleolus. He received IV vancomycin + cefepime and was transitioned to augmentin + doxycycline with instructions to complete a 6 week total course. The pt's wife managed his antibiotics and, because he started to have decreased appetite, she discontinued his antibiotics after 2-3 weeks. He initially did well, but then developed increased LE edema (currently on Lasix, last dose yesterday), re-opening of healed portions of the wounds, grayish discoloration. His wife noted increased drainage that was not purulent, no foul odor. His wife has been performing daily dressing changes as previously instructed. He has still been able to ambulate, though can go shorter distances than his baseline.    He was seen in the clinic on 8/22 for these complaints, and per chart review, had a long exchange regarding workup options. He was sent for X-rays at that time which showed active osteomyelitis now in his R 4th  metatarsal head. He was also scheduled for ABIs to quantify his PAD and determine if intervention would increase the chances of wound healing. He was called and advised to report to the hospital for admission to further manage osteomyelitis.         Meds:  Current Meds  Medication Sig  . allopurinol (ZYLOPRIM) 100 MG tablet take 2 tablets by mouth once daily  . aspirin EC 81 MG tablet Take 81 mg by mouth daily.   . finasteride (PROSCAR) 5 MG tablet Take 5 mg by mouth daily.  . Potassium 95 MG TABS Take 95 mg by mouth daily.  . Zinc 50 MG TABS Take 50 mg by mouth daily.     Allergies: Allergies as of 07/13/2017  . (No Known Allergies)   Past Medical History:  Diagnosis Date  . Acute blood loss anemia 10/18/2013  . Anemia of chronic disease   . Atrial fibrillation ([place])   . Chronic kidney disease   . Chronic osteomyelitis ([place])   . Gout   . History of blood transfusion    "related to anemia" (07/16/2017)  . Hypertension   . PVD (peripheral vascular disease) ([place])     Family History: History reviewed. No pertinent family history.   Social History:  Social History  Substance Use Topics  . Smoking status: Former Smoker    Packs/day: 1.00    Years: 43.00    Types: Cigarettes  . Smokeless  tobacco: Never Used     Comment: "quit smoking cigarettes in the 1980s"  . Alcohol use Yes     Comment: "nothing anymore" (02/03/2016)     Review of Systems: A complete ROS was negative except as per HPI.   Physical Exam: Blood pressure (!) 156/78, pulse 93, temperature 98 F (36.7 C), temperature source Oral, resp. rate 18, height 5' 11"  (1.803 m), weight 170 lb 0.3 oz (77.1 kg), SpO2 96 %. Physical Exam  Constitutional:  Elderly male resting in bed comfortably, no acute distress   HENT:  Head: Normocephalic and atraumatic.  Mouth/Throat: Oropharynx is clear and moist.  Eyes:  Sequela of remote R eye surgery, otherwise PERRL   Neck: Neck supple.  Cardiovascular: Normal rate,  regular rhythm and normal heart sounds.   Pulmonary/Chest: Effort normal. No respiratory distress. He has no wheezes.  Abdominal: Soft. Bowel sounds are normal. He exhibits no distension. There is no tenderness.  Musculoskeletal:  Bilateral LE pitting edema, multiple toe amputations to bilateral feet. Chronic arthritic changes to bilateral hands  Neurological: He is alert.  Skin:  Multiple chronic wounds to bilateral feet- R dorsum and medial malleoli and plantar surface, largest wound at L medial forefoot. No frank purulence. See media tab for clinical photos     Assessment & Plan by Problem: Osteomyelitis, R 4th Metatarsal Chronic LE Wounds  Pt with chronic osteomyelitis to various areas of bilateral feet resulting in several metatarsal amputations, currently with radiographic evidence of active osteomyelitis to R 4th metatarsal head. Labs 8/22 show elevated ESR (64) and CRP (27.9). Recently was admitted in May '18 for osteomyelitis, received IV antibiotics, did not complete full 6 week course as an outpatient. He has previously been evaluated by Orthopedics who recommended full amputation, since that time he has been unwilling to be evaluated by ortho. IR consulted upon admission, but feel the site of osteo is not amenable to biopsy via interventional radiology means. He is currently hemodynamically stable without signs of systemic infection. --Wound care consult --CBC with diff, PT/INR   --CMP   --Lasix 40 mg daily for LE edema --Re-discuss need for orthopedic intervention for biopsy, if possible prior to empiric abx    Peripheral Arterial Disease Previously had ABI performed in 2016 which showed mild reduction in RLE arterial flow, moderate in LLE. Atherosclerosis is apparent in radiographic images of LEs. Poor arterial flow is likely a large component of his poor wound healing and recurrent infections. --Vasc US with ABI      Dispo: Admit patient to Inpatient with expected length of  stay greater than 2 midnights.  Signed: Tawny Asal, MD 07/16/2017, 6:25 PM  Pager: [phone]

## 2017-07-16 NOTE — Consult Note (Addendum)
WOC Nurse wound consult note Reason for Consult: Pt is familiar to [facility] team from multiple previous admissions over the past several years, most recent was 5/31.  He has severe chronic full thickness wounds to BLE and ortho service recommended amputations in the past.  Pt and his wife declined this plan of care, and prefer not to use the outpatient wound care center either. Bedside nurse has just finished applying dressings to bilat legs, so WOC nurse will not assess at this time.  Wife performs wound care daily with Silvadene and would like to continue this plan of care; she states wounds recently began to have more drainage and decline.    EMR indicates that primary team plans to request a biopsy and ABI to assess perfusion.  Wife does not want an ortho consult. X-ray indicates: Changes consistent with active osteomyelitis involving the head of the right fourth metatarsal.  This is beyond the scope of WOC practice. Dressing procedure/placement/frequency: Continue present plan of care with Silvadene.Please re-consult if further assistance is needed.  Thank-you,  Cammie Mcgee MSN, RN, CWOCN, [place], CNS [phone]

## 2017-07-16 NOTE — Progress Notes (Signed)
CRITICAL VALUE ALER Critical Value: hgb 6.8  Date & Time Notied: 07/16/17 1540  Provider Notified:Wallcae  Orders Received/Actions taken: awaiting

## 2017-07-16 NOTE — Progress Notes (Signed)
Wife at bedside refusing due medications despite explanations

## 2017-07-17 ENCOUNTER — Telehealth: Payer: Self-pay

## 2017-07-17 ENCOUNTER — Inpatient Hospital Stay (HOSPITAL_COMMUNITY): Payer: Medicare Other

## 2017-07-17 DIAGNOSIS — M86171 Other acute osteomyelitis, right ankle and foot: Secondary | ICD-10-CM

## 2017-07-17 DIAGNOSIS — M6258 Muscle wasting and atrophy, not elsewhere classified, other site: Secondary | ICD-10-CM

## 2017-07-17 DIAGNOSIS — I739 Peripheral vascular disease, unspecified: Secondary | ICD-10-CM

## 2017-07-17 DIAGNOSIS — I70203 Unspecified atherosclerosis of native arteries of extremities, bilateral legs: Principal | ICD-10-CM

## 2017-07-17 DIAGNOSIS — M86671 Other chronic osteomyelitis, right ankle and foot: Secondary | ICD-10-CM

## 2017-07-17 DIAGNOSIS — M86672 Other chronic osteomyelitis, left ankle and foot: Secondary | ICD-10-CM

## 2017-07-17 DIAGNOSIS — M86172 Other acute osteomyelitis, left ankle and foot: Secondary | ICD-10-CM

## 2017-07-17 LAB — FERRITIN: FERRITIN: 24 ng/mL (ref 24–336)

## 2017-07-17 LAB — CBC
HEMATOCRIT: 26.4 % — AB (ref 39.0–52.0)
HEMOGLOBIN: 8.3 g/dL — AB (ref 13.0–17.0)
MCH: 24.6 pg — ABNORMAL LOW (ref 26.0–34.0)
MCHC: 31.4 g/dL (ref 30.0–36.0)
MCV: 78.3 fL (ref 78.0–100.0)
Platelets: 320 10*3/uL (ref 150–400)
RBC: 3.37 MIL/uL — ABNORMAL LOW (ref 4.22–5.81)
RDW: 17.3 % — AB (ref 11.5–15.5)
WBC: 8.4 10*3/uL (ref 4.0–10.5)

## 2017-07-17 LAB — RETICULOCYTES
RBC.: 3.47 MIL/uL — AB (ref 4.22–5.81)
RETIC CT PCT: 1.4 % (ref 0.4–3.1)
Retic Count, Absolute: 48.6 10*3/uL (ref 19.0–186.0)

## 2017-07-17 LAB — TYPE AND SCREEN
ABO/RH(D): O POS
ANTIBODY SCREEN: NEGATIVE
UNIT DIVISION: 0

## 2017-07-17 LAB — BASIC METABOLIC PANEL
ANION GAP: 6 (ref 5–15)
BUN: 15 mg/dL (ref 6–20)
CO2: 26 mmol/L (ref 22–32)
Calcium: 9 mg/dL (ref 8.9–10.3)
Chloride: 109 mmol/L (ref 101–111)
Creatinine, Ser: 1.53 mg/dL — ABNORMAL HIGH (ref 0.61–1.24)
GFR calc Af Amer: 44 mL/min — ABNORMAL LOW (ref >60)
GFR, EST NON AFRICAN AMERICAN: 38 mL/min — AB (ref >60)
Glucose, Bld: 90 mg/dL (ref 65–99)
POTASSIUM: 3.8 mmol/L (ref 3.5–5.1)
SODIUM: 141 mmol/L (ref 135–145)

## 2017-07-17 LAB — BPAM RBC
Blood Product Expiration Date: 201809252359
ISSUE DATE / TIME: 201808272025
UNIT TYPE AND RH: 5100

## 2017-07-17 MED ORDER — FUROSEMIDE 40 MG PO TABS: 40.0000 mg | ORAL_TABLET | Freq: Every day | ORAL | 5 refills | Status: DC

## 2017-07-17 MED ORDER — CEPHALEXIN 500 MG PO CAPS
500.0000 mg | ORAL_CAPSULE | Freq: Once | ORAL | Status: AC
Start: 2017-07-17 — End: 2017-07-17
  Administered 2017-07-17: 500 mg via ORAL
  Filled 2017-07-17: qty 1

## 2017-07-17 MED ORDER — CEPHALEXIN 500 MG PO CAPS
500.0000 mg | ORAL_CAPSULE | Freq: Two times a day (BID) | ORAL | 0 refills | Status: DC
Start: 2017-07-17 — End: 2017-07-24

## 2017-07-17 NOTE — Progress Notes (Signed)
VASCULAR LAB PRELIMINARY  ARTERIAL  ABI completed:    RIGHT    LEFT    PRESSURE WAVEFORM  PRESSURE WAVEFORM  BRACHIAL 169 Triphasic BRACHIAL 168 Triphasic  DP 116 Dampened Monophasic DP 124 Biphasic  PT 113 Dampened Monophasic PT 124 Biphasic  GREAT TOE 62 NA THIRD DIGIT 66 NA    RIGHT LEFT  ABI / TBI 0.69 / 0.37 0.73 / 0.39    ABIs indicate a moderate reduction in arterial flow bilaterally at rest. TBIs are abnormal bilaterally. The left TBI was calculated using the third toe due to previous amputations of the great toe and second digit.  Chelsey Redondo, RVS 07/17/2017, 12:53 PM

## 2017-07-17 NOTE — Consult Note (Signed)
CONSULTATION: Podiatry Reason for Consult: b/l lower extremity ulcers. H/o osteomyelitis  HPI: [name] is a 83 yo M with history of chronic osteomyelitis and LE wounds, PVD, CKD, HTN, a-fib who presents as inpatient for chronic LE wounds.   He has a several year history of multiple non-healing wounds and osteomyelitis to his bilateral feet and ankles, resulting in multiple toe amputations. He was admitted 04/10/17 for bacteremia with active osteomyelitis of his R medial malleolus. He received IV vancomycin + cefepime and was transitioned to augmentin + doxycycline with instructions to complete a 6 week total course. The pt's wife managed his antibiotics and, because he started to have decreased appetite, she discontinued his antibiotics after 2-3 weeks. He initially did well, but then developed increased LE edema (currently on Lasix, last dose yesterday), re-opening of healed portions of the wounds, grayish discoloration. His wife noted increased drainage that was not purulent, no foul odor. His wife has been performing daily dressing changes as previously instructed. He has still been able to ambulate, though can go shorter distances than his baseline.    Recent x-rays taken of the right foot were concerning for osteomyelitis to the distal aspect of the fourth metatarsal head. Podiatry consulted to assess the bilateral lower extremity ulcerations and perform possible bone biopsy to the fourth metatarsal head.  Past Medical History:  Diagnosis Date  . Acute blood loss anemia 10/18/2013  . Anemia of chronic disease   . Atrial fibrillation (HCC)   . Chronic kidney disease   . Chronic osteomyelitis (HCC)   . Gout   . History of blood transfusion    "related to anemia" (07/16/2017)  . Hypertension   . PVD (peripheral vascular disease) [facility])       Physical Exam: General: The patient is alert and oriented x3 in no acute distress.  Dermatology: Multiple full-thickness ulcerations of  the bilateral lower extremities with history of previous partial amputations to the bilateral feet. Ulcerations do not appear to probe to bone. To the noted ulcerations there is no eschar. There is a moderate amount of slough fibrin and necrotic tissue noted. Moderate amount of serosanguineous drainage noted. Periwound integrity is intact without any sign of erythema or edema. No malodor. There is no exposed bone muscle-tendon ligament or joint.  Vascular: Diminished pedal pulses bilaterally, neurovascular status otherwise intact.  No edema or erythema noted.   Neurological: Epicritic and protective threshold absent bilaterally.   Musculoskeletal Exam: History of prior previous partial foot amputations bilateral lower extremities  Assessment: 1. Chronic ulcerations bilateral lower extremities   Plan of Care:  1. Patient was evaluated. 2. At the moment all of the patient's ulcerations appear chronic and stable in nature. No clinical evidence of acute infection or cellulitis. I personally looked at the radiographs and did not find any evidence of acute aggressive osteomyelitis, despite the cortical irregularities which appear chronic in nature 3. To this time recommend conservative wound care. Wound care nurse, Dawn, was present this morning and currently applying Silvadene cream with dry sterile dressings daily. Continue inpatient wound care.  4. Recommend the patient follow-up at the [facility] upon discharge.   Thank you for the consultation. Please contact me directly if any questions. [phone]  Felecia Shelling, DPM [facility]  Dr. Felecia Shelling, DPM    [address]  [place], [place] [address]                Office [phone]  Fax [phone]

## 2017-07-17 NOTE — Consult Note (Addendum)
WOC Nurse wound follow up Removed dressings to assess bilat legs, refer to progress note from yesterday.  Dr Logan Bores of podiatry at the bedside to assess wound appearance and provide further plan of care related to osteomyelitis. Wound type: Chronic full thickness wounds to BLE as follows: Left inner foot 12X4X.3cm, left anterior foot 2X2X.1cm Right inner foot 10X5X.3cm, right anterior foot  5X2X.3cm and 6X3X.3cm. Wound bed: All wounds 80% red, 20% yellow, mod amt tan drainage, no odor Periwound: Pink dry scar tissue surrounding wounds Dressing procedure/placement/frequency: Applied Silvadene and orders provided for staff or wife to change dressings Q day. Please re-consult if further assistance is needed.  Thank-you,  Cammie Mcgee MSN, RN, CWOCN, [place], CNS [phone]

## 2017-07-17 NOTE — Progress Notes (Signed)
   Subjective: No acute events overnight, denies pain to his feet or elsewhere, has not walked around room or been in chair. Has an appetite this morning. States his wife handles his medical issues, not present in the room this morning.   Objective:  Vital signs in last 24 hours: Vitals:   07/16/17 2058 07/16/17 2310 07/17/17 0523 07/17/17 0636  BP: (!) 187/91 (!) 176/82 (!) 194/84 (!) 185/82  Pulse: 84 82 87 78  Resp: '20 20 18   '$ Temp: 97.9 F (36.6 C) 97.9 F (36.6 C) 97.9 F (36.6 C)   TempSrc: Oral Oral Oral   SpO2: 96% 96% 94%   Weight:      Height:       Physical Exam  Constitutional:  Elderly male resting in bed in no acute distress   HENT:  Temporal wasting   Cardiovascular: Normal rate and regular rhythm.   Pulmonary/Chest: Effort normal. No respiratory distress.  Abdominal: Soft. He exhibits no distension. There is no tenderness.  Musculoskeletal:  LE edema   Neurological: He is alert.  Skin:  Multiple chronic bilateral LE wounds, no purulent or malodorous discharge      Assessment/Plan:  Osteomyelitis, R 4th Metatarsal Chronic LE Wounds  Pt with chronic osteomyelitis to various areas of bilateral feet resulting in several metatarsal amputations, currently with radiographic evidence of active osteomyelitis to R 4th metatarsal head. Labs 8/22 show elevated ESR (64) and CRP (27.9). Recently was admitted in May '18 for osteomyelitis, received IV antibiotics, did not complete full 6 week course as an outpatient. He has previously been evaluated by Orthopedics who recommended full amputation, since that time he has been unwilling to be evaluated by ortho. IR consulted upon admission, but feel the site of osteo is not amenable to biopsy via interventional radiology means. Podiatry evaluated who felt biopsy or further intervention was not indicated, likely chronic process and changes. He is currently hemodynamically stable without signs of systemic infection. Without  definitive debridement, his chronic osteomyelitis is unlikely to be fully treated or prevented from recurring. Per literature review and given his current clinical picture, he may benefit from an oral antibiotic with coverage of skin pathogens rather than broad IV antibiotics.  --Cephalexin 500 mg BID --Lasix 40 mg daily for LE edema    Peripheral Arterial Disease Previously had ABI performed in 2016 which showed mild reduction in RLE arterial flow, moderate in LLE. Atherosclerosis is apparent in radiographic images of LEs. Poor arterial flow is likely a large component of his poor wound healing and recurrent infections. As anticipated, ABI and TBI show poor perfusion, however he is unlikely to be a candidate for vascular intervention.   Anemia,microcytic Previously attributed to a combination of his CKD III and anemia of chronic disease with his chronic wounds. His baseline Hgb appears to be 8-9, initial labs revealed Hgb 6.8 on admission, received 1 U pRBCs with improvement. Hgb currently 8.3. --CBC  --Ferritin, Retic     Dispo: Anticipated discharge in approximately 2-3 day(s).   Tawny Asal, MD 07/17/2017, 10:46 AM Pager: [phone]

## 2017-07-17 NOTE — Telephone Encounter (Signed)
Hospital TOC per Dr Allena Katz, discharge 07/17/2017, appt 07/24/2017 @ 2:15.

## 2017-07-17 NOTE — Progress Notes (Signed)
PT Cancellation Note  Patient Details Name: Vincent Miranda MRN: [id number] DOB: [birth year]   Cancelled Treatment:    Reason Eval/Treat Not Completed: Fatigue/lethargy limiting ability to participate; patient reports did not sleep well due to upset at MD for not giving him meds to make his legs better.  Prolonged discussion and encouragement for participation and less worry.  Patient continued to decline.  Will attempt another day.   [name] 07/17/2017, 3:42 PM  [name], [place] [phone] 07/17/2017

## 2017-07-17 NOTE — Evaluation (Signed)
Occupational Therapy Evaluation Patient Details Name: Vincent Miranda MRN: [id number] DOB: June 10, 1925 Today's Date: 07/17/2017    History of Present Illness 83 yo M with history of chronic osteomyelitis and LE wounds, PVD, CKD, HTN, a-fib who presents as a direct admission for chronic LE wounds.   Clinical Impression   PTA, pt reports that he was living with his wife who assisted with basic ADLs and used a RW for functional mobility. Pt performed grooming at bed level demonstrating generalized weakness and limited ROM of BUEs. Educated pt on importance of OOB activity to prevent further weakness and pneumonia; pt continues to decline mobility and further ADLs beyond grooming. Pt would benefit from further acute OT to optimize occupational performance and facilitate safe dc. Recommend dc for post-acute rehab to increase safety and independence; pending pt's progress may need SNF placement.     Follow Up Recommendations  Supervision/Assistance - 24 hour (Pending pt progress SNF vs HH.)    Equipment Recommendations  Other (comment) (Defer to next venue)    Recommendations for Other Services PT consult     Precautions / Restrictions Precautions Precautions: Fall      Mobility Bed Mobility               General bed mobility comments: Declined OOB activity. Required A to reposition in bed  Transfers                 General transfer comment: Declined OOB activity    Balance                                           ADL either performed or assessed with clinical judgement   ADL Overall ADL's : Needs assistance/impaired Eating/Feeding: Set up;Bed level   Grooming: Brushing hair;Wash/dry face;Set up;Bed level Grooming Details (indicate cue type and reason): Pt washed his face and wet his hair with a wash cloth while in bed. Pt declining any activity at EOB.                                General ADL Comments: Pt declining to  perform any ADLs besides grooming at bed level. Pt stating that he does not want to get OOB today.  According to pt's reported PLOF (per the pt), decreased motivation for OOB activity, and observation of grooming in bed; feel that the pt would require Max A for bathing and dressing. Will assess further at later session     Vision Baseline Vision/History: Wears glasses Wears Glasses: Reading only;At all times Patient Visual Report: No change from baseline       Perception     Praxis      Pertinent Vitals/Pain Pain Assessment: Faces Faces Pain Scale: No hurt Pain Intervention(s): Monitored during session     Hand Dominance Left ("I use both my hands")   Extremity/Trunk Assessment Upper Extremity Assessment Upper Extremity Assessment: Generalized weakness;RUE deficits/detail;LUE deficits/detail RUE Deficits / Details: R thumb smaller/difformity and pt tends not to use his R hand. Pt with limited ROM and strength at shoulder and elbow. Poor grasp strength as well. RUE Coordination: decreased fine motor;decreased gross motor LUE Deficits / Details: Pt with decreased ROM and strength. When wetting hair with wash cloth, pt used his R hand to push his L elbow up increase the ROM so he  could reach the top of his head.  Poor grasps trength as well LUE Coordination: decreased fine motor;decreased gross motor   Lower Extremity Assessment Lower Extremity Assessment: Defer to PT evaluation       Communication Communication Communication: No difficulties   Cognition Arousal/Alertness: Awake/alert Behavior During Therapy: WFL for tasks assessed/performed Overall Cognitive Status: No family/caregiver present to determine baseline cognitive functioning                                 General Comments: Pt seems WFL. Would perseverate on topics of MD and medication.   General Comments  Pt reporting that he plans of going home tomorrow    Exercises     Shoulder Instructions       Home Living Family/patient expects to be discharged to:: Private residence Living Arrangements: Spouse/significant other;Children Available Help at Discharge: Family;Available 24 hours/day (Wife and son) Type of Home: House Home Access: Stairs to enter [employer] of Steps: 1   Home Layout: One level     Bathroom Shower/Tub: Tub/shower unit (sponge bathes)         Home Equipment: Walker - 2 wheels;Bedside commode          Prior Functioning/Environment Level of Independence: Needs assistance  Gait / Transfers Assistance Needed: walks with walker per pt ADL's / Homemaking Assistance Needed: wife assist wtih ADL's   Comments: denies falls; "I don't do much driving because my wife likes to drive the care."        OT Problem List: Decreased strength;Decreased range of motion;Decreased activity tolerance;Decreased safety awareness;Decreased knowledge of use of DME or AE;Decreased knowledge of precautions      OT Treatment/Interventions: Self-care/ADL training;Therapeutic exercise;Energy conservation;DME and/or AE instruction;Therapeutic activities;Patient/family education    OT Goals(Current goals can be found in the care plan section) Acute Rehab OT Goals Patient Stated Goal: "Get my medication" OT Goal Formulation: With patient Time For Goal Achievement: 07/31/17 Potential to Achieve Goals: Good ADL Goals Pt Will Perform Upper Body Dressing: with min assist;sitting Pt Will Perform Lower Body Dressing: with mod assist;sit to/from stand Pt Will Transfer to Toilet: with mod assist;bedside commode;stand pivot transfer Pt Will Perform Toileting - Clothing Manipulation and hygiene: with mod assist;sit to/from stand Additional ADL Goal #1: Pt will perform bed mobility with Mod A in preparation for ADL  OT Frequency: Min 2X/week   Barriers to D/C:            Co-evaluation              AM-PAC PT "6 Clicks" Daily Activity     Outcome Measure Help from  another person eating meals?: A Little Help from another person taking care of personal grooming?: A Little Help from another person toileting, which includes using toliet, bedpan, or urinal?: A Lot Help from another person bathing (including washing, rinsing, drying)?: A Lot Help from another person to put on and taking off regular upper body clothing?: A Lot Help from another person to put on and taking off regular lower body clothing?: A Lot 6 Click Score: 14   End of Session Nurse Communication: Mobility status  Activity Tolerance: Patient limited by fatigue Patient left: in bed;with call bell/phone within reach;with bed alarm set  OT Visit Diagnosis: Muscle weakness (generalized) (M62.81)                Time: 4098-1191 OT Time Calculation (min): 21 min Charges:  OT  General Charges $OT Visit: 1 Visit OT Evaluation $OT Eval Moderate Complexity: 1 Mod G-Codes:     Haze Antillon MSOT, OTR/L Acute Rehab Pager: [phone] Office: [phone]  Theodoro Grist Kyanne Rials 07/17/2017, 5:00 PM

## 2017-07-18 MED ORDER — VANCOMYCIN HCL IN DEXTROSE 1-5 GM/200ML-% IV SOLN
1000.0000 mg | Freq: Once | INTRAVENOUS | Status: AC
  Administered 2017-07-18: 1000 mg via INTRAVENOUS
  Filled 2017-07-18: qty 200

## 2017-07-18 MED ORDER — VANCOMYCIN HCL IN DEXTROSE 750-5 MG/150ML-% IV SOLN
750.0000 mg | INTRAVENOUS | Status: DC
  Administered 2017-07-19 – 2017-07-21 (×3): 750 mg via INTRAVENOUS
  Filled 2017-07-18 (×4): qty 150

## 2017-07-18 NOTE — Progress Notes (Signed)
   Subjective: No acute events overnight, no specific symptoms reported. Dressing removed from RLE which revealed some purulent drainage collected in dressings over the preceding 24 hrs, no foul odor.   Objective:  Vital signs in last 24 hours: Vitals:   07/17/17 0636 07/17/17 1413 07/17/17 2203 07/18/17 0511  BP: (!) 185/82 (!) 175/87 (!) 179/83 (!) 187/97  Pulse: 78 85 98 (!) 107  Resp:  '16 16 18  '$ Temp:  97.8 F (36.6 C) 98.7 F (37.1 C) 98.8 F (37.1 C)  TempSrc:  Oral Oral Oral  SpO2:  94% 94% 93%  Weight:      Height:       Physical Exam  Constitutional:  Elderly male resting in bed in no acute distress   HENT:  Temporal wasting   Cardiovascular: Normal rate and regular rhythm.   Pulmonary/Chest: Effort normal. No respiratory distress.  Abdominal: Soft. He exhibits no distension. There is no tenderness.  Musculoskeletal:  LE edema   Neurological: He is alert.  Skin:  Multiple chronic bilateral LE wounds, purulence to RLE dressing, no foul odor, otherwise stable appearance of visualized wounds       Assessment/Plan:  Osteomyelitis, R 4th Metatarsal Chronic LE Wounds  Pt with chronic osteomyelitis to various areas of bilateral feet resulting in several metatarsal amputations, currently with radiographic evidence of active osteomyelitis to R 4th metatarsal head. Labs 8/22 show elevated ESR (64) and CRP (27.9). Recently was admitted in May '18 for osteomyelitis, received IV antibiotics, did not complete full 6 week course as an outpatient. He has previously been evaluated by Orthopedics who recommended full amputation, since that time he has been unwilling to be evaluated by ortho. IR consulted upon admission, but feel the site of osteo is not amenable to biopsy via interventional radiology means. Podiatry evaluated who felt biopsy or further intervention was not indicated, likely chronic process and changes. He is currently hemodynamically stable without signs of systemic  infection. Without definitive debridement or removal, his chronic osteomyelitis is unlikely to be fully treated or prevented from recurring. On repeat examination, he may be having a low level of purulent drainage collected within dressings and visualized today. Will plan for IV abx for 48 hrs to suppress bacterial burden and promote healing.  --IV Vancomycin per pharmacy  --Lasix 40 mg daily for LE edema  Peripheral Arterial Disease Previously had ABI performed in 2016 which showed mild reduction in RLE arterial flow, moderate in LLE. Atherosclerosis is apparent in radiographic images of LEs. Poor arterial flow is likely a large component of his poor wound healing and recurrent infections. As anticipated, ABI and TBI show poor perfusion, however he is unlikely to be a candidate for vascular intervention.   Anemia,microcytic Previously attributed to a combination of his CKD III and anemia of chronic disease with his chronic wounds. His baseline Hgb appears to be 8-9, initial labs revealed Hgb 6.8 on admission, received 1 U pRBCs with improvement. Last Hgb 8.3.  Dispo: Anticipated discharge in approximately 2-3 day(s).   Tawny Asal, MD 07/18/2017, 11:00 AM Pager: [phone]

## 2017-07-18 NOTE — Progress Notes (Signed)
OT Cancellation Note  Patient Details Name: AADVIK HOERL MRN: [id number] DOB: 1925-08-19   Cancelled Treatment:    Reason Eval/Treat Not Completed: Other (comment). Pt up in recliner upon arrival. Wife stated that she and staff transferred pt to recliner by SPT and that he is not going to do any standing right now until after he eats lunch to go back to bed  Galen Manila 07/18/2017, 12:56 PM

## 2017-07-18 NOTE — Progress Notes (Signed)
Internal Medicine Attending:   I saw and examined the patient. I reviewed the resident's note and I agree with the resident's findings and plan as documented in the resident's note.  83 year old man with chronic ischemic lower extremity wounds admitted for soft tissue wound infections. Initially we thought the wounds didn't look too bad, probably would have been fine with just conservative wound management with or without oral antibiotics. This morning when I examined the wounds, I took off the dressing on the right foot and there was copious purulent drainage on the large wound over the medial malleolus. No significant foul odor to it. The wound itself looks about the same after the drainage is wiped away. I think the significant amount of purulence that developed over the last 1 day is new, and likely does represent new soft tissue infection of the chronic wound. Plan will be to start IV vancomycin today, transition to an oral antibiotic when the infection improves, maybe 1-2 days.

## 2017-07-18 NOTE — Progress Notes (Signed)
Pharmacy Antibiotic Note  Vincent PigeonRobert T Miranda is a 83 y.o. male admitted on 07/16/2017 with wound infection.  Pharmacy has been consulted for Vancomycin dosing.  Pt admitted 8/27 with increased swelling & skin breakdown to chronic LE wounds, chronic osteomyelitis; he was started on Cephalexin.  This AM, purulent drainage noted on dressing and is to start IV abx.  There are no signs of systemic infection.  Plan: Vancomycin 1000mg  IV x 1, then 750mg  IV q24 for target trough 10-15 Watch renal fxn Vanc trough as appropriate  Height: 5\' 11"  (180.3 cm) Weight: 170 lb 0.3 oz (77.1 kg) IBW/kg (Calculated) : 75.3  Temp (24hrs), Avg:98.4 F (36.9 C), Min:97.8 F (36.6 C), Max:98.8 F (37.1 C)   Recent Labs Lab 07/11/17 1643 07/16/17 1511 07/17/17 0201  WBC 8.5 6.5 8.4  CREATININE 1.46* 1.56* 1.53*    Estimated Creatinine Clearance: 34.2 mL/min (A) (by C-G formula based on SCr of 1.53 mg/dL (H)).    No Known Allergies  Antimicrobials this admission: Cephalexin x 1 dose 8/28  Vancomycin 8/29 >>   Dose adjustments this admission:   Microbiology results: No cx data  Thank you for allowing pharmacy to be a part of this patient's care.   Vincent MorinKendra Marvena Miranda, B.S., PharmD Clinical Pharmacist  [facility] [place] [phone] by 3p, then call [phone]

## 2017-07-18 NOTE — Progress Notes (Signed)
PT Cancellation Note  Patient Details Name: Hope PigeonRobert T Mans MRN: [id number] DOB: Jan 04, 1925   Cancelled Treatment:    Reason Eval/Treat Not Completed: Other (comment), pt's wife relayed that pt does not need therapy, that he has been up since he has been here and that she will do everything for him that he needs. Goals and benefits of therapy were relayed and pt's wife still asked therapist to leave. Wife also escalated more when this therapist asked pt about pain and reported she did not understand why everyone keeps asking that. Reasons were explained. Pt remained silent throughout interaction though questions were addressed to him. PT will check back later if time allows.    TurkeyVictoria L Madyson Lukach  [phone] 07/18/2017, 9:36 AM

## 2017-07-18 NOTE — Progress Notes (Signed)
Internal Medicine Attending:   I saw and examined the patient. I reviewed the resident's note and I agree with the resident's findings and plan as documented in the resident's note.  83 year old man admitted for evaluation of worsening chronic leg wounds and xray findings of acute osteomyelitis. We appreciate podiatry input, I agree that these changes seem chronic ischemic skin breakdown, and there is very little acute infection right now. I think oral antibiotics for a short course is a reasonable thing to try, otherwise this will take daily wound care and support. We will plan to transition him back to home with support as soon as it can be arranged.

## 2017-07-19 DIAGNOSIS — L0889 Other specified local infections of the skin and subcutaneous tissue: Secondary | ICD-10-CM

## 2017-07-19 LAB — BASIC METABOLIC PANEL
Anion gap: 9 (ref 5–15)
BUN: 14 mg/dL (ref 6–20)
CO2: 23 mmol/L (ref 22–32)
Calcium: 9.2 mg/dL (ref 8.9–10.3)
Chloride: 111 mmol/L (ref 101–111)
Creatinine, Ser: 1.52 mg/dL — ABNORMAL HIGH (ref 0.61–1.24)
GFR calc Af Amer: 45 mL/min — ABNORMAL LOW (ref >60)
GFR, EST NON AFRICAN AMERICAN: 39 mL/min — AB (ref >60)
GLUCOSE: 102 mg/dL — AB (ref 65–99)
POTASSIUM: 3.9 mmol/L (ref 3.5–5.1)
Sodium: 143 mmol/L (ref 135–145)

## 2017-07-19 LAB — CBC
HEMATOCRIT: 26.9 % — AB (ref 39.0–52.0)
Hemoglobin: 8.4 g/dL — ABNORMAL LOW (ref 13.0–17.0)
MCH: 24.3 pg — ABNORMAL LOW (ref 26.0–34.0)
MCHC: 31.2 g/dL (ref 30.0–36.0)
MCV: 78 fL (ref 78.0–100.0)
Platelets: 331 10*3/uL (ref 150–400)
RBC: 3.45 MIL/uL — ABNORMAL LOW (ref 4.22–5.81)
RDW: 18.1 % — AB (ref 11.5–15.5)
WBC: 9 10*3/uL (ref 4.0–10.5)

## 2017-07-19 MED ORDER — ONDANSETRON HCL 4 MG/2ML IJ SOLN
4.0000 mg | Freq: Once | INTRAMUSCULAR | Status: AC
  Administered 2017-07-19: 4 mg via INTRAVENOUS
  Filled 2017-07-19: qty 2

## 2017-07-19 NOTE — Progress Notes (Signed)
   Subjective:  Patient seen this morning resting comfortably in bed.  Reports some stomach upset last night that has since resolved.  No other acute complaints.    Objective:  Vital signs in last 24 hours: Vitals:   07/18/17 1459 07/18/17 2247 07/19/17 0500 07/19/17 0542  BP: (!) 164/88 (!) 177/98  121/65  Pulse: 89 89  94  Resp: 18 17  17   Temp: 98.2 F (36.8 C) 98.9 F (37.2 C)  98.3 F (36.8 C)  TempSrc: Oral Oral    SpO2: 95% 94%  93%  Weight:   185 lb (83.9 kg)   Height:       Physical Exam  Constitutional:  Elderly male resting in bed in no acute distress   HENT:  Temporal wasting   Pulmonary/Chest: Effort normal. No respiratory distress.  Musculoskeletal:  LE edema   Neurological: He is alert.  Skin:  Multiple chronic bilateral LE wounds, purulence noted to LLE dressing when unwrapped, no foul odor, otherwise stable appearance of visualized wounds       Assessment/Plan:  Osteomyelitis, R 4th Metatarsal Chronic LE Wounds  On exam today, his left lower extremity wrapping was taken down and this was a good amount purulence noted on this bandage.  He is s/p day 1 of vancomycin IV to attempt some suppression of bacterial burden and to help promote healing.  We will attempt to discuss with his wife later today regarding the amount of purulence she typically notices in the morning with dressing changes. - Continue IV vancomycin per pharmacy for now - Continue Lasix 40mg  daily for LE edema.  Renal function and potassium stable this morning with his history of CKD and hypokalemia.  Peripheral Arterial Disease Previously had ABI performed in 2016 which showed mild reduction in RLE arterial flow, moderate in LLE. Atherosclerosis is apparent in radiographic images of LEs. Poor arterial flow is likely a large component of his poor wound healing and recurrent infections. As anticipated, ABI and TBI show poor perfusion, however he is unlikely to be a candidate for vascular  intervention.   Anemia,microcytic Previously attributed to a combination of his CKD III and anemia of chronic disease with his chronic wounds. His baseline Hgb appears to be 8-9, initial labs revealed Hgb 6.8 on admission, received 1 U pRBCs with improvement.  Ferritin borderline low-normal at 24. - Hemoglobin this morning stable at 8.4 - Consider oral iron supplementation.  Dispo: Anticipated discharge in approximately 2-3 day(s).   Gwynn BurlyWallace, Jessilynn Taft, DO 07/19/2017, 11:18 AM Pager: [phone]

## 2017-07-19 NOTE — Progress Notes (Signed)
OT Cancellation Note  Patient Details Name: Vincent PigeonRobert T Miranda MRN: [id number] DOB: 1925/06/24   Cancelled Treatment:      Reason Eval/Treatment Not Completed: Other (comment). Pt lying in bed upon OT arrival. Wife states that she and staff will assist pt to recliner later after lunch, that he is not going to do any thing for OT right now, "We know that he needs to do things" After pt/spouse were educated in acute OT and pt goals were reviewed. Pt wife politely declined and pt agreed with her. Will f/u as able.  Mariam DollarBarnhill, Amy Beth Dixon, OTR/L 07/19/2017, 12:11 PM

## 2017-07-19 NOTE — Progress Notes (Signed)
Internal Medicine Attending:   I saw and examined the patient. I reviewed the resident's note and I agree with the resident's findings and plan as documented in the resident's note.  83 year old man with chronic ischemic lower extremity wounds with now associated soft tissue infections. Still significant purulence on the left sided bandage this morning, no significant odor. Plan to continue with IV vancomycin, transition to oral antibiotic potentially tomorrow.

## 2017-07-19 NOTE — Evaluation (Signed)
Physical Therapy Evaluation Patient Details Name: Vincent Miranda MRN: [id number] DOB: 07-Mar-1925 Today's Date: 07/19/2017   History of Present Illness  83 yo M with history of chronic osteomyelitis and LE wounds, PVD, CKD, HTN, a-fib who presents as a direct admission for chronic LE wounds.  Clinical Impression  Patient presents with decreased mobility and limited activity tolerance, decreased balance, and decreased strength.  Feel he will benefit from skilled PT in the acute setting to allow return home with wife assist.  Currently refusing [facility] services, but may benefit if agreeable.      Follow Up Recommendations Home health PT (but pt and wife refusing services)    Equipment Recommendations  None recommended by PT    Recommendations for Other Services       Precautions / Restrictions Precautions Precautions: Fall      Mobility  Bed Mobility               General bed mobility comments: up sitting EOB  Transfers Overall transfer level: Needs assistance Equipment used: None Transfers: Stand Pivot Transfers;Sit to/from Stand Sit to Stand: Min guard Stand pivot transfers: Min guard       General transfer comment: pt declining assist, but unsuccessful to stand after 2 trials rocking for momentum, did come up mainly on his own, but then landed in chair uncontrolled descent  Ambulation/Gait                Stairs            Wheelchair Mobility    Modified Rankin (Stroke Patients Only)       Balance Overall balance assessment: Needs assistance   Sitting balance-Leahy Scale: Good     Standing balance support: Single extremity supported;Bilateral upper extremity supported Standing balance-Leahy Scale: Poor Standing balance comment: UE support on chair during brief standing and pivoting                             Pertinent Vitals/Pain Faces Pain Scale: No hurt    Home Living Family/patient expects to be discharged to:: Private  residence Living Arrangements: Spouse/significant other;Children Available Help at Discharge: Family;Available 24 hours/day (wife and son) Type of Home: House Home Access: Stairs to enter   [employer] of Steps: 1 Home Layout: One level Home Equipment: Walker - 2 wheels;Bedside commode      Prior Function Level of Independence: Needs assistance   Gait / Transfers Assistance Needed: walks with walker to the car, no device in house (likely furniture walks)  ADL's / Homemaking Assistance Needed: wife assist wtih ADL's  Comments: denies falls     Hand Dominance        Extremity/Trunk Assessment   Upper Extremity Assessment Upper Extremity Assessment: Defer to OT evaluation    Lower Extremity Assessment Lower Extremity Assessment: LLE deficits/detail;RLE deficits/detail RLE Deficits / Details: both feet wrapped and noted missing toes, generalized weakness in LE's present LLE Deficits / Details: both feet wrapped and noted missing toes, generalized weakness in LE's present       Communication   Communication: No difficulties  Cognition Arousal/Alertness: Awake/alert Behavior During Therapy: Agitated;WFL for tasks assessed/performed Overall Cognitive Status: History of cognitive impairments - at baseline                                 General Comments: wife and pt irritated than I wanted  him to walk, they wanted chair right beside him, but continued to say they would have no trouble getting him in the home      General Comments General comments (skin integrity, edema, etc.): wife in room and frustrated initially that I wanted pt to walk, they continued to want chair beside bed even after educated on need for ambulation safety for home entry and weakness from prolonged bedrest    Exercises     Assessment/Plan    PT Assessment Patient needs continued PT services  PT Problem List Decreased strength;Decreased balance;Decreased knowledge of use of  DME;Decreased mobility;Decreased activity tolerance       PT Treatment Interventions DME instruction;Gait training;Neuromuscular re-education;Balance training;Therapeutic exercise;Patient/family education;Therapeutic activities    PT Goals (Current goals can be found in the Care Plan section)  Acute Rehab PT Goals Patient Stated Goal: To go home PT Goal Formulation: With patient/family Time For Goal Achievement: 07/26/17 Potential to Achieve Goals: Fair    Frequency Min 3X/week   Barriers to discharge        Co-evaluation               AM-PAC PT "6 Clicks" Daily Activity  Outcome Measure Difficulty turning over in bed (including adjusting bedclothes, sheets and blankets)?: A Little Difficulty moving from lying on back to sitting on the side of the bed? : A Lot Difficulty sitting down on and standing up from a chair with arms (e.g., wheelchair, bedside commode, etc,.)?: Unable Help needed moving to and from a bed to chair (including a wheelchair)?: A Little Help needed walking in hospital room?: A Little Help needed climbing 3-5 steps with a railing? : A Lot 6 Click Score: 14    End of Session Equipment Utilized During Treatment: Gait belt Activity Tolerance: Patient limited by fatigue Patient left: in chair;with call bell/phone within reach;with family/visitor present   PT Visit Diagnosis: Muscle weakness (generalized) (M62.81)    Time: 9629-52841330-1358 PT Time Calculation (min) (ACUTE ONLY): 28 min   Charges:   PT Evaluation $PT Eval Moderate Complexity: 1 Mod PT Treatments $Therapeutic Activity: 8-22 mins   PT G CodesSheran Miranda:        Vincent Miranda, [place] CarolinaPT [phone] 07/19/2017   Vincent Miranda 07/19/2017, 4:03 PM

## 2017-07-20 ENCOUNTER — Inpatient Hospital Stay (HOSPITAL_COMMUNITY): Payer: Medicare Other

## 2017-07-20 DIAGNOSIS — R0602 Shortness of breath: Secondary | ICD-10-CM

## 2017-07-20 LAB — VITAMIN B12: Vitamin B-12: 411 pg/mL (ref 180–914)

## 2017-07-20 LAB — CBC
HCT: 26.6 % — ABNORMAL LOW (ref 39.0–52.0)
Hemoglobin: 7.9 g/dL — ABNORMAL LOW (ref 13.0–17.0)
MCH: 23.7 pg — ABNORMAL LOW (ref 26.0–34.0)
MCHC: 29.7 g/dL — ABNORMAL LOW (ref 30.0–36.0)
MCV: 79.6 fL (ref 78.0–100.0)
PLATELETS: 259 10*3/uL (ref 150–400)
RBC: 3.34 MIL/uL — AB (ref 4.22–5.81)
RDW: 17.8 % — ABNORMAL HIGH (ref 11.5–15.5)
WBC: 7.7 10*3/uL (ref 4.0–10.5)

## 2017-07-20 LAB — BASIC METABOLIC PANEL
ANION GAP: 4 — AB (ref 5–15)
BUN: 16 mg/dL (ref 6–20)
CALCIUM: 9.2 mg/dL (ref 8.9–10.3)
CO2: 28 mmol/L (ref 22–32)
Chloride: 112 mmol/L — ABNORMAL HIGH (ref 101–111)
Creatinine, Ser: 1.78 mg/dL — ABNORMAL HIGH (ref 0.61–1.24)
GFR, EST AFRICAN AMERICAN: 37 mL/min — AB (ref >60)
GFR, EST NON AFRICAN AMERICAN: 32 mL/min — AB (ref >60)
Glucose, Bld: 89 mg/dL (ref 65–99)
POTASSIUM: 3.6 mmol/L (ref 3.5–5.1)
SODIUM: 144 mmol/L (ref 135–145)

## 2017-07-20 LAB — RETICULOCYTES
RBC.: 3.58 MIL/uL — ABNORMAL LOW (ref 4.22–5.81)
RETIC COUNT ABSOLUTE: 46.5 10*3/uL (ref 19.0–186.0)
RETIC CT PCT: 1.3 % (ref 0.4–3.1)

## 2017-07-20 LAB — IRON AND TIBC
Iron: 18 ug/dL — ABNORMAL LOW (ref 45–182)
SATURATION RATIOS: 7 % — AB (ref 17.9–39.5)
TIBC: 267 ug/dL (ref 250–450)
UIBC: 249 ug/dL

## 2017-07-20 LAB — FOLATE: FOLATE: 11.1 ng/mL (ref >5.9)

## 2017-07-20 LAB — FERRITIN: Ferritin: 16 ng/mL — ABNORMAL LOW (ref 24–336)

## 2017-07-20 MED ORDER — DEXTROSE 5 % IV SOLN
1.0000 g | INTRAVENOUS | Status: DC
  Administered 2017-07-20 – 2017-07-24 (×5): 1 g via INTRAVENOUS
  Filled 2017-07-20 (×5): qty 1

## 2017-07-20 MED ORDER — SODIUM CHLORIDE 0.9 % IV SOLN
510.0000 mg | INTRAVENOUS | Status: AC
  Administered 2017-07-20 – 2017-07-23 (×2): 510 mg via INTRAVENOUS
  Filled 2017-07-20 (×2): qty 17

## 2017-07-20 NOTE — Progress Notes (Signed)
   Subjective: Pt noted to have some SOB and saturations in high 80s overnight, placed on 2 L [place]. States it has improved this morning. Also notes improvement in LE edema and wife feels wounds look better than initial presentation, still with some purulent appearing discharge. Wife states his typical discharge is clear, small amount.    Objective:  Vital signs in last 24 hours: Vitals:   07/19/17 2240 07/20/17 0200 07/20/17 0210 07/20/17 0629  BP: 105/60 105/60  (!) 149/72  Pulse: 88   77  Resp: 16 18  16   Temp: 99.1 F (37.3 C) 99.1 F (37.3 C)  97.9 F (36.6 C)  TempSrc: Oral Oral    SpO2: 91% (!) 89% 92% 97%  Weight:      Height:       Physical Exam  Constitutional:  Elderly male resting in bed with [place] in no acute distress   HENT:  Temporal wasting   Pulmonary/Chest: Effort normal and breath sounds normal. No respiratory distress. He has no wheezes.  Musculoskeletal:  LE edema   Neurological: He is alert.  Skin:  Multiple chronic bilateral LE wounds, purulence noted to bilateral dressings when unwrapped, no foul odor, otherwise stable appearance of visualized wounds      Assessment/Plan:  Osteomyelitis, R 4th Metatarsal Chronic LE Wounds  On exam today, his left lower extremity wrapping was taken down and this was a good amount purulence noted on this bandage.  He is s/p day 1 of vancomycin IV to attempt some suppression of bacterial burden and to help promote healing. His edema has improved with diuresis, though his Cr did have slight interval increase from his baseline. He continues to have purulent discharge from his wounds, will broaden antibiotic coverage.  - Add Cefepime per pharmacy   - Continue IV vancomycin per pharmacy for now - Hold diuresis today    Peripheral Arterial Disease Previously had ABI performed in 2016 which showed mild reduction in RLE arterial flow, moderate in LLE. Atherosclerosis is apparent in radiographic images of LEs. Poor arterial flow  is likely a large component of his poor wound healing and recurrent infections. As anticipated, ABI and TBI show poor perfusion, however he is unlikely to be a candidate for vascular intervention.   Anemia,microcytic Previously attributed to a combination of his CKD III and anemia of chronic disease with his chronic wounds. His baseline Hgb appears to be 8-9, initial labs revealed Hgb 6.8 on admission, received 1 U pRBCs with improvement.  Ferritin borderline low-normal at 24. His SOB may be related to down-trending Hgb and symptomatic anemia. Hgb currently 7.9.  - IV Fe supplementation  - CBC daily   Shortness of Breath Pt experienced SOB overnight, required supplemental oxygen via [place] with improvement, lung exam unremarkable this morning. Given his low functional status, may have areas of atelectasis or symptomatic anemia as described above. Will obtain CXR for better evaluation.  - CXR   Dispo: Anticipated discharge in approximately 2-3 day(s).   Ginger CarneHarden, Rukiya Hodgkins, MD 07/20/2017, 7:05 AM Pager: [phone]

## 2017-07-20 NOTE — Progress Notes (Signed)
PT Cancellation Note  Patient Details Name: Hope PigeonRobert T Beaston MRN: [id number] DOB: [birth year]   Cancelled Treatment:    Reason Eval/Treat Not Completed: (P) Other (comment) (Spoke with patient and wife who both report patient is at baseline and is manageable at home in his current state.  Wife and patient wish to discontinue PT services at this time.  Will inform supervising PT of information obtained from patient and spouse)   Florestine Aversimee J Elvenia Godden 07/20/2017, 4:14 PM  Joycelyn RuaAimee Beulah Matusek, PTA pager [phone]

## 2017-07-20 NOTE — Progress Notes (Signed)
Pharmacy Antibiotic Note  Vincent PigeonRobert T Nater is a 83 y.o. male admitted on 07/16/2017 with bilateral LE wound infection/ cellulitis on Vancomycin per pharmacy consult.  Pharmacy has been consulted to add Cefepime dosing. Significant purulence noted on the LLE yesterday around bandage and today upon takedown of bandage.    WBC is within normal limits. Patient remains afebrile. SCr is rising at 1.78 with estimated CrCl ~25-30 mL/min. UOP is documented at 0.2 ml/kg/hr but I am unsure how accurate that is.   Plan: Cefepime 1g IV every 24 hours.  Continue Vancomycin 750 mg IV every 24 hours.  Monitor renal function and clinical status.  Follow-up culture results if obtained.   Height: 5\' 11"  (180.3 cm) Weight: 185 lb (83.9 kg) IBW/kg (Calculated) : 75.3  Temp (24hrs), Avg:98.6 F (37 C), Min:97.9 F (36.6 C), Max:99.1 F (37.3 C)   Recent Labs Lab 07/16/17 1511 07/17/17 0201 07/19/17 0347 07/20/17 0546  WBC 6.5 8.4 9.0 7.7  CREATININE 1.56* 1.53* 1.52* 1.78*    Estimated Creatinine Clearance: 29.4 mL/min (A) (by C-G formula based on SCr of 1.78 mg/dL (H)).    No Known Allergies  Antimicrobials this admission: Vancomycin 8/29 >> Cefepime 8/31 >>  Dose adjustments this admission:   Microbiology results: none  Thank you for allowing pharmacy to be a part of this patient's care.  Link SnufferJessica Crayton Savarese, PharmD, BCPS Clinical Pharmacist Clinical phone 07/20/2017 until 3:[phone] After hours, please call #[phone] 07/20/2017 10:52 AM

## 2017-07-20 NOTE — Care Management Note (Signed)
Case Management Note  Patient Details  Name: Hope PigeonRobert T Shock MRN: [id number] Date of Birth: September 04, 1925  Subjective/Objective:                    Action/Plan:  Spoke with patient and wife at bedside. They are refusing home health PT/RN. Wife states they have all needed DME at home. Patient currently on oxygen. If needed at home patient does not have home O2. Will need oxygen saturation ambulation note and MD order.   Wife plans to transport patient home at discharge. No need for ambulance. Expected Discharge Date:  07/17/17               Expected Discharge Plan:  Home/Self Care  In-House Referral:     Discharge planning Services  CM Consult  Post Acute Care Choice:  Home Health Choice offered to:  Patient, Spouse  DME Arranged:    DME Agency:     HH Arranged:  Patient Refused HH HH Agency:     Status of Service:  Completed, signed off  If discussed at [employer] Length of Stay Meetings, dates discussed:    Additional Comments:  Kingsley PlanWile, Illana Nolting Marie, RN 07/20/2017, 10:47 AM

## 2017-07-20 NOTE — Progress Notes (Signed)
Pt called complaining of SOB. Vital signs taken, O2 at 89, BP at 161/80, HR at 83. Pt's lung sound diminished. Administered 2L oxygen per nasal cannula. Pt's O2 up to 92. Will monitor pt.

## 2017-07-20 NOTE — Progress Notes (Signed)
Internal Medicine Attending:   I saw and examined the patient. I reviewed the resident's note and I agree with the resident's findings and plan as documented in the resident's note.  83 year old man with chronic ischemic lower extremity wounds admitted with soft tissue infections bilaterally on his feet associated with these chronic wound. We looked at as well as this morning with his wife who does his usual wound care. He still does have a large amount of purulent drainage that collected and the bandage over the last 24 hours. No odor, wife confirms that this is much worse than usual. No pain, no signs of systemic infection. Unfortunately this drainage hasn't improved much after 48 hours of vancomycin. Plan to add cefepime today, this is almost certainly a polymicrobial infection. I think once the discharge improves more he can go back to home, wife does wound care, and could complete an oral antibiotic course. Also notable today is progressive microcytic anemia, ferritin is borderline, mostly this is anemia of renal disease. I think we can give him IV iron today, may need another blood transfusion if he becomes more symptomatic.

## 2017-07-20 NOTE — Care Management Important Message (Signed)
Important Message  Patient Details  Name: Vincent Miranda MRN: [id number] Date of Birth: 1925-06-22   Medicare Important Message Given:  Yes    Kyla BalzarineShealy, Facundo Allemand Abena 07/20/2017, 9:40 AM

## 2017-07-20 NOTE — Progress Notes (Signed)
MEDICATION RELATED CONSULT NOTE - INITIAL   Pharmacy Consult for IV iron Indication: low iron stores, Hgb  No Known Allergies  Patient Measurements: Height: 5\' 11"  (180.3 cm) Weight: 185 lb (83.9 kg) IBW/kg (Calculated) : 75.3 Adjusted Body Weight:   Labs:  Recent Labs  07/19/17 0347 07/20/17 0546  WBC 9.0 7.7  HGB 8.4* 7.9*  HCT 26.9* 26.6*  PLT 331 259  CREATININE 1.52* 1.78*   Estimated Creatinine Clearance: 29.4 mL/min (A) (by C-G formula based on SCr of 1.78 mg/dL (H)).   Microbiology: No results found for this or any previous visit (from the past 720 hour(s)).  Medical History: Past Medical History:  Diagnosis Date  . Acute blood loss anemia 10/18/2013  . Anemia of chronic disease   . Atrial fibrillation (HCC)   . Chronic kidney disease   . Chronic osteomyelitis (HCC)   . Gout   . History of blood transfusion    "related to anemia" (07/16/2017)  . Hypertension   . PVD (peripheral vascular disease) (HCC)     Medications:  Scheduled:  . allopurinol  200 mg Oral Daily  . aspirin EC  81 mg Oral Daily  . enoxaparin (LOVENOX) injection  40 mg Subcutaneous Q24H  . silver sulfADIAZINE   Topical Daily    Assessment: 83 yo male with anemia related to CKD, low Hgb, and iron panel revealing low [place] and iron levels.  Pharmacy asked to replete iron.  Goal of Therapy:  Normalization of iron stores/Hgb  Plan:  Feraheme 510 mg x 1 now.  Can repeat dose in 3 days if still inpatient.  Tad MooreJessica Lelania Bia, Pharm D, BCPS  Clinical Pharmacist Pager [phone]  07/20/2017 4:58 PM

## 2017-07-20 NOTE — Progress Notes (Signed)
OT Cancellation Note  Patient Details Name: Vincent PigeonRobert T Miranda MRN: [id number] DOB: 1925-04-09   Cancelled Treatment:    Reason Eval/Treat Not Completed: Other (comment) (pt. and spouse requesting d/c from ot).  Attempted skilled OT session.  Pt. And spouse explained that they feel our "assistance" with the pt. Is a hindrance to his current abilities.  They both state "nothing has changed" with how he was moving around at home to now.  Wife reports she feels 100% capable of managing him and they both do not have any concerns or need to "review things".  They both politely delcine the continuation of skilled OT.  Reviewed I would be alerting OTR/L to sign off and they agreed and verbalized understanding.  I also explained PT would still be coming by and they would need to have a separate conversation with them as OT signing off did not mean a d/c of therapy across the board.  They state they understand.  Both had no further questions or concerns.    Robet LeuMorris, Kailany Dinunzio Lorraine, COTA/L 07/20/2017, 10:43 AM

## 2017-07-21 DIAGNOSIS — M868X7 Other osteomyelitis, ankle and foot: Secondary | ICD-10-CM

## 2017-07-21 LAB — BASIC METABOLIC PANEL
ANION GAP: 5 (ref 5–15)
BUN: 18 mg/dL (ref 6–20)
CALCIUM: 9.2 mg/dL (ref 8.9–10.3)
CO2: 29 mmol/L (ref 22–32)
CREATININE: 1.82 mg/dL — AB (ref 0.61–1.24)
Chloride: 112 mmol/L — ABNORMAL HIGH (ref 101–111)
GFR, EST AFRICAN AMERICAN: 36 mL/min — AB (ref >60)
GFR, EST NON AFRICAN AMERICAN: 31 mL/min — AB (ref >60)
Glucose, Bld: 83 mg/dL (ref 65–99)
Potassium: 3.5 mmol/L (ref 3.5–5.1)
Sodium: 146 mmol/L — ABNORMAL HIGH (ref 135–145)

## 2017-07-21 LAB — CBC
HCT: 27.1 % — ABNORMAL LOW (ref 39.0–52.0)
Hemoglobin: 7.9 g/dL — ABNORMAL LOW (ref 13.0–17.0)
MCH: 23.1 pg — ABNORMAL LOW (ref 26.0–34.0)
MCHC: 29.2 g/dL — AB (ref 30.0–36.0)
MCV: 79.2 fL (ref 78.0–100.0)
PLATELETS: 265 10*3/uL (ref 150–400)
RBC: 3.42 MIL/uL — ABNORMAL LOW (ref 4.22–5.81)
RDW: 17.7 % — AB (ref 11.5–15.5)
WBC: 7.4 10*3/uL (ref 4.0–10.5)

## 2017-07-21 MED ORDER — SENNOSIDES-DOCUSATE SODIUM 8.6-50 MG PO TABS
1.0000 | ORAL_TABLET | Freq: Two times a day (BID) | ORAL | Status: DC | PRN
  Administered 2017-07-21: 1 via ORAL
  Filled 2017-07-21: qty 1

## 2017-07-21 MED ORDER — SODIUM CHLORIDE 0.9 % IV SOLN
Freq: Once | INTRAVENOUS | Status: AC
Start: 2017-07-21 — End: 2017-07-21
  Administered 2017-07-21: 1 mL via INTRAVENOUS

## 2017-07-21 NOTE — Progress Notes (Signed)
Pt's wife unavailable when attempted to call to answer questions mentioned

## 2017-07-21 NOTE — Progress Notes (Signed)
   Subjective: No acute events overnight, continues to have purulent drainage covering LE bandages. No specific complaints this morning, denies SOB today.    Objective:  Vital signs in last 24 hours: Vitals:   07/20/17 1240 07/20/17 2050 07/21/17 0530 07/21/17 1245  BP: 137/73 (!) 143/71 (!) 149/75 139/73  Pulse: 80 99 81 72  Resp: 16 16 16 16   Temp: 97.8 F (36.6 C) 98.8 F (37.1 C) 98.5 F (36.9 C) 97.9 F (36.6 C)  TempSrc: Oral Oral Oral Oral  SpO2: 94% 95% 100% 98%  Weight:   184 lb (83.5 kg)   Height:       Physical Exam  Constitutional:  Elderly male sitting in chair with legs elevated, resting comfortably in no acute distress   HENT:  Temporal wasting   Pulmonary/Chest: Effort normal. No respiratory distress.  Neurological: He is alert.  Skin:  Multiple chronic bilateral LE wounds, continued purulence noted to bilateral dressings, no foul odor, otherwise stable appearance of visualized wounds      Assessment/Plan:  Osteomyelitis, R 4th Metatarsal Chronic LE Wounds  On exam today, his left lower extremity wrapping was taken down and this was a good amount purulence noted on this bandage.  He is s/p day 1 of vancomycin IV to attempt some suppression of bacterial burden and to help promote healing. His edema has improved with diuresis, though his Cr did have slight interval increase from his baseline. He continues to have purulent discharge from his wounds, will broaden antibiotic coverage.  - Continue IV vancomycin and cefepime per pharmacy - Hold diuresis today, 500 cc IVF and monitor renal function closely    Peripheral Arterial Disease Previously had ABI performed in 2016 which showed mild reduction in RLE arterial flow, moderate in LLE. Atherosclerosis is apparent in radiographic images of LEs. Poor arterial flow is likely a large component of his poor wound healing and recurrent infections. As anticipated, ABI and TBI show poor perfusion, however he is unlikely  to be a candidate for vascular intervention.   Anemia,microcytic Previously attributed to a combination of his CKD III and anemia of chronic disease with his chronic wounds. His baseline Hgb appears to be 8-9, initial labs revealed Hgb 6.8 on admission, received 1 U pRBCs with improvement.  Ferritin borderline low-normal at 24. His SOB may be related to down-trending Hgb and symptomatic anemia. Hgb currently 7.9.  - IV Fe supplementation  - CBC daily   Shortness of Breath Pt experienced SOB overnight, required supplemental oxygen via [place] with improvement, lung exam unremarkable this morning. Given his low functional status, may have areas of atelectasis or symptomatic anemia as described above. CXR reassuring, will continue to monitor  Dispo: Anticipated discharge in approximately 2-3 day(s).   Ginger CarneHarden, Shiana Rappleye, MD 07/21/2017, 4:12 PM Pager: [phone]

## 2017-07-21 NOTE — Progress Notes (Signed)
Pt wife at the bedside waiting for the MD to talk about the discharge paged on call MD, wife wants to change the wound dressing on his foot and pt refused the lovenox he wants only aspirin, given senna for constipation.

## 2017-07-21 NOTE — Progress Notes (Signed)
  Date: 07/21/2017  Patient name: Vincent PigeonRobert T Stormer  Medical record number: [id number]  Date of birth: 09-Mar-1925   I have seen and evaluated this patient and I have discussed the plan of care with the house staff. Please see their note for complete details. I concur with their findings.  Burns SpainButcher, Derell Bruun A, MD 07/21/2017, 6:56 PM

## 2017-07-22 LAB — BASIC METABOLIC PANEL
ANION GAP: 5 (ref 5–15)
BUN: 17 mg/dL (ref 6–20)
CALCIUM: 9.3 mg/dL (ref 8.9–10.3)
CO2: 28 mmol/L (ref 22–32)
Chloride: 112 mmol/L — ABNORMAL HIGH (ref 101–111)
Creatinine, Ser: 1.57 mg/dL — ABNORMAL HIGH (ref 0.61–1.24)
GFR calc Af Amer: 43 mL/min — ABNORMAL LOW (ref >60)
GFR, EST NON AFRICAN AMERICAN: 37 mL/min — AB (ref >60)
GLUCOSE: 83 mg/dL (ref 65–99)
POTASSIUM: 3.4 mmol/L — AB (ref 3.5–5.1)
Sodium: 145 mmol/L (ref 135–145)

## 2017-07-22 LAB — CBC
HCT: 26.7 % — ABNORMAL LOW (ref 39.0–52.0)
Hemoglobin: 8 g/dL — ABNORMAL LOW (ref 13.0–17.0)
MCH: 23.7 pg — ABNORMAL LOW (ref 26.0–34.0)
MCHC: 30 g/dL (ref 30.0–36.0)
MCV: 79.2 fL (ref 78.0–100.0)
PLATELETS: 284 10*3/uL (ref 150–400)
RBC: 3.37 MIL/uL — AB (ref 4.22–5.81)
RDW: 17.9 % — AB (ref 11.5–15.5)
WBC: 6.4 10*3/uL (ref 4.0–10.5)

## 2017-07-22 LAB — VANCOMYCIN, TROUGH: VANCOMYCIN TR: 21 ug/mL — AB (ref 15–20)

## 2017-07-22 MED ORDER — VANCOMYCIN HCL 500 MG IV SOLR
500.0000 mg | INTRAVENOUS | Status: DC
  Administered 2017-07-22 – 2017-07-24 (×3): 500 mg via INTRAVENOUS
  Filled 2017-07-22 (×3): qty 500

## 2017-07-22 MED ORDER — POTASSIUM CHLORIDE CRYS ER 20 MEQ PO TBCR
20.0000 meq | EXTENDED_RELEASE_TABLET | Freq: Two times a day (BID) | ORAL | Status: AC
  Administered 2017-07-22 (×2): 20 meq via ORAL
  Filled 2017-07-22 (×2): qty 1

## 2017-07-22 NOTE — Progress Notes (Signed)
   Subjective: Patient was feeling better and since this morning.no complaints.He continued to have purulent drainage from his lower extremity wound.  Objective:  Vital signs in last 24 hours: Vitals:   07/21/17 0530 07/21/17 1245 07/21/17 2059 07/22/17 0445  BP: (!) 149/75 139/73 (!) 165/76 (!) 161/76  Pulse: 81 72 74 71  Resp: 16 16 19 18   Temp: 98.5 F (36.9 C) 97.9 F (36.6 C) 98.4 F (36.9 C) 98.6 F (37 C)  TempSrc: Oral Oral Oral Oral  SpO2: 100% 98% 95% 96%  Weight: 184 lb (83.5 kg)   184 lb (83.5 kg)  Height:       Gen.Well-developed elderly man, sitting comfortably on the side of his bed, in no acute distress. Chest.clear bilaterally. CV.regular rate and rhythm. Abdomen.soft, non tender, bowel sounds positive. Extremities.Trace lower extremity edema.bilateral dressing on his feet with purulent discharge.  Assessment/Plan:  Osteomyelitis, R 4th Metatarsal Chronic LE Wounds. He continued to get purulent discharge from his wounds.He remained afebrile. -Continue cefepime and vancomycin per pharmacy.  AKI: creatinine improving after 500 mL of IV fluid yesterday.most likely because of dehydration after having Lasix and poor by mouth intake. -Keep holding Lasix. -Encourage increase by mouth intake.  Peripheral Arterial Disease.His worsening peripheral arterial disease is a main contributor to his non-healing lower extremity ulcers, however he is unlikely to be a candidate for vascular intervention.  Anemia,microcytic. Hemoglobin stable at 8.0 today. -continue IV iron supplement. -Keep monitoring with daily CBC.  Dispo: Anticipated discharge in approximately 2-3 day(s).   Arnetha CourserAmin, Diamante Truszkowski, MD 07/22/2017, 3:34 PM Pager: [phone]

## 2017-07-22 NOTE — Progress Notes (Signed)
Pharmacy Antibiotic Note  Vincent PigeonRobert T Miranda is a 83 y.o. male admitted on 07/16/2017 with bilateral LE LE chronic wounds and osteomyelitis at R-4th metatarsal.  Pharmacy has been consulted to add Cefepime dosing. Significant purulence at LLE bandage.   Vancomycin trough is just slightly elevated at 21 on 750 mg IV every 24 hours- drawn 23.5hrs since last dose. Goal trough is 15-20 mcg/mL for osteomyelitis. Patient has CKD 3- SCr trending back down at 1.57 with est CrCl ~ 30-35 mL/min. UOP 0.7 ml/kg/hr documented.   Plan: Cefepime 1g IV every 24 hours.  Reduce Vancomycin 500 mg IV every 24 hours.  Monitor renal function and clinical status.  Follow-up culture results if obtained.   Height: 5\' 11"  (180.3 cm) Weight: 184 lb (83.5 kg) IBW/kg (Calculated) : 75.3  Temp (24hrs), Avg:98.5 F (36.9 C), Min:98.4 F (36.9 C), Max:98.6 F (37 C)   Recent Labs Lab 07/17/17 0201 07/19/17 0347 07/20/17 0546 07/21/17 0457 07/22/17 0335 07/22/17 1149  WBC 8.4 9.0 7.7 7.4 6.4  --   CREATININE 1.53* 1.52* 1.78* 1.82* 1.57*  --   VANCOTROUGH  --   --   --   --   --  21*    Estimated Creatinine Clearance: 33.3 mL/min (A) (by C-G formula based on SCr of 1.57 mg/dL (H)).    No Known Allergies  Antimicrobials this admission: Vancomycin 8/29 >> Cefepime 8/31 >>  Dose adjustments this admission: 9/2 Vancomycin Trough = 21 on 750mg  IV every 24 hours (prior to 4th dose). Reduced dose to 500mg  IV every 24 hours.   Microbiology results: none  Thank you for allowing pharmacy to be a part of this patient's care.  Vincent SnufferJessica Karliah Miranda, PharmD, BCPS Clinical Pharmacist Clinical phone 07/22/2017 until 3:[phone] After hours, please call [phone] 07/22/2017 2:10 PM

## 2017-07-23 MED ORDER — FUROSEMIDE 10 MG/ML IJ SOLN
20.0000 mg | Freq: Once | INTRAMUSCULAR | Status: AC
  Administered 2017-07-23: 20 mg via INTRAVENOUS
  Filled 2017-07-23: qty 2

## 2017-07-23 NOTE — Progress Notes (Signed)
  Date: 07/23/2017  Patient name: Vincent PigeonRobert T Miranda  Medical record number: [id number]  Date of birth: Oct 27, 1925   I have seen and evaluated this patient and I have discussed the plan of care with the house staff. Please see their note for complete details. I concur with their findings.  Burns SpainButcher, Dontel Harshberger A, MD 07/23/2017, 12:43 PM

## 2017-07-23 NOTE — Progress Notes (Signed)
PT Cancellation Note  Patient Details Name: Vincent PigeonRobert T Albright MRN: [id number] DOB: [birth year]   Cancelled Treatment:    Reason Eval/Treat Not Completed: Other (comment). Per previous PTA note, pt and family adamant that they do not want to receive PT while hospitalized. Will remove from caseload.   Ina HomesJaclyn Ludmila Ebarb, PT, DPT Acute Rehab Services  Pager: [phone]  Malachy ChamberJaclyn L Larnie Heart 07/23/2017, 8:43 AM

## 2017-07-23 NOTE — Progress Notes (Signed)
   Subjective: No acute events overnight, continues to have purulent drainage, no pain or discomfort. Notes some shortness of breath, saturating 98% on room air during time of exam. His wife was not in the room during exam this morning.     Objective:  Vital signs in last 24 hours: Vitals:   07/22/17 0445 07/22/17 1500 07/22/17 2102 07/23/17 0506  BP: (!) 161/76 (!) 158/72 (!) 162/82 (!) 168/86  Pulse: 71 77 (!) 58 71  Resp: 18 17 17 17   Temp: 98.6 F (37 C) 98 F (36.7 C) 98.6 F (37 C) 97.9 F (36.6 C)  TempSrc: Oral Oral Oral Oral  SpO2: 96% 100% 100% 96%  Weight: 184 lb (83.5 kg)   183 lb 12.8 oz (83.4 kg)  Height:       Physical Exam  Constitutional:  Elderly male sitting in chair with legs elevated, resting comfortably in no acute distress   HENT:  Temporal wasting   Pulmonary/Chest: Effort normal. No respiratory distress.  Musculoskeletal:  Interval increase in LE edema   Neurological: He is alert.  Skin:  Multiple chronic bilateral LE wounds, continued purulence noted to wounds and dressings during takedown    Assessment/Plan:  Osteomyelitis, R 4th Metatarsal Chronic LE Wounds, LE Edema   His chronic wounds have had continued purulent drainage, currently being treated with broad spectrum antibiotics. His edema improved with diuresis, though his Cr did have slight interval increase from his baseline which improved with gentle IVF. Edema has re-accumulated in the interval.  - Continue IV vancomycin and cefepime per pharmacy - Lasix 20 mg today, continue to monitor renal function closely    Peripheral Arterial Disease Poor arterial flow is likely a large component of his poor wound healing and recurrent infections. As anticipated, ABI and TBI show poor perfusion, however he is unlikely to be a candidate for vascular intervention.   Anemia,microcytic Previously attributed to a combination of his CKD III and anemia of chronic disease with his chronic wounds. His  baseline Hgb appears to be 8-9, initial labs revealed Hgb 6.8 on admission, received 1 U pRBCs with improvement.  Ferritin borderline low-normal at 24. His SOB may be related to down-trending Hgb and symptomatic anemia. Hgb currently 8.0.  - Continue IV Fe supplementation  - CBC daily   Shortness of Breath Pt intermittently complaining of SOB, lung exam with some crackles this morning. His sx may be related to atelectasis or symptomatic anemia as described above or some fluid buildup with holding Lasix. CXR reassuring, will continue to monitor and give lasix today as above.   Dispo: Anticipated discharge in approximately 2-3 day(s).   Ginger CarneHarden, Markea Ruzich, MD 07/23/2017, 10:14 AM Pager: [phone]

## 2017-07-24 ENCOUNTER — Ambulatory Visit: Payer: Medicare Other

## 2017-07-24 DIAGNOSIS — I739 Peripheral vascular disease, unspecified: Secondary | ICD-10-CM

## 2017-07-24 DIAGNOSIS — Z89422 Acquired absence of other left toe(s): Secondary | ICD-10-CM

## 2017-07-24 DIAGNOSIS — Z9889 Other specified postprocedural states: Secondary | ICD-10-CM

## 2017-07-24 DIAGNOSIS — D509 Iron deficiency anemia, unspecified: Secondary | ICD-10-CM

## 2017-07-24 DIAGNOSIS — M8669 Other chronic osteomyelitis, multiple sites: Secondary | ICD-10-CM

## 2017-07-24 DIAGNOSIS — R600 Localized edema: Secondary | ICD-10-CM

## 2017-07-24 DIAGNOSIS — Z89421 Acquired absence of other right toe(s): Secondary | ICD-10-CM

## 2017-07-24 DIAGNOSIS — D631 Anemia in chronic kidney disease: Secondary | ICD-10-CM

## 2017-07-24 DIAGNOSIS — N183 Chronic kidney disease, stage 3 (moderate): Secondary | ICD-10-CM

## 2017-07-24 DIAGNOSIS — I129 Hypertensive chronic kidney disease with stage 1 through stage 4 chronic kidney disease, or unspecified chronic kidney disease: Secondary | ICD-10-CM

## 2017-07-24 LAB — CBC
HEMATOCRIT: 24.9 % — AB (ref 39.0–52.0)
Hemoglobin: 7.5 g/dL — ABNORMAL LOW (ref 13.0–17.0)
MCH: 23.9 pg — ABNORMAL LOW (ref 26.0–34.0)
MCHC: 30.1 g/dL (ref 30.0–36.0)
MCV: 79.3 fL (ref 78.0–100.0)
PLATELETS: 257 10*3/uL (ref 150–400)
RBC: 3.14 MIL/uL — ABNORMAL LOW (ref 4.22–5.81)
RDW: 18.3 % — AB (ref 11.5–15.5)
WBC: 6.5 10*3/uL (ref 4.0–10.5)

## 2017-07-24 LAB — BASIC METABOLIC PANEL
ANION GAP: 5 (ref 5–15)
BUN: 13 mg/dL (ref 6–20)
CO2: 26 mmol/L (ref 22–32)
Calcium: 9.5 mg/dL (ref 8.9–10.3)
Chloride: 113 mmol/L — ABNORMAL HIGH (ref 101–111)
Creatinine, Ser: 1.44 mg/dL — ABNORMAL HIGH (ref 0.61–1.24)
GFR calc Af Amer: 48 mL/min — ABNORMAL LOW (ref >60)
GFR, EST NON AFRICAN AMERICAN: 41 mL/min — AB (ref >60)
Glucose, Bld: 81 mg/dL (ref 65–99)
POTASSIUM: 3.5 mmol/L (ref 3.5–5.1)
SODIUM: 144 mmol/L (ref 135–145)

## 2017-07-24 MED ORDER — CEPHALEXIN 500 MG PO CAPS: 500.0000 mg | ORAL_CAPSULE | Freq: Two times a day (BID) | ORAL | 0 refills | Status: AC

## 2017-07-24 MED ORDER — FUROSEMIDE 20 MG PO TABS: 20.0000 mg | ORAL_TABLET | Freq: Every day | ORAL | 1 refills | Status: DC | PRN

## 2017-07-24 MED ORDER — DOXYCYCLINE HYCLATE 100 MG PO TABS: 100.0000 mg | ORAL_TABLET | Freq: Two times a day (BID) | ORAL | 0 refills | Status: AC

## 2017-07-24 NOTE — Care Management Important Message (Signed)
Important Message  Patient Details  Name: Vincent Miranda MRN: [id number] Date of Birth: [birth year]   Medicare Important Message Given:  Yes    Kaliq Lege 07/24/2017, 1:05 PM

## 2017-07-24 NOTE — Discharge Summary (Signed)
Name: Vincent Miranda MRN: [id number] DOB: 12-26-1924 83 y.o. PCP: Gwynn Burly, DO  Date of Admission: 07/16/2017 11:11 AM Date of Discharge: 07/24/2017 Attending Physician: Gust Rung, DO  Discharge Diagnosis: Active Problems:   Osteomyelitis (HCC)   CKD (chronic kidney disease), stage III   Anemia of chronic disease   PVD (peripheral vascular disease) (HCC)   Discharge Medications: Allergies as of 07/24/2017   No Known Allergies     Medication List    TAKE these medications   allopurinol 100 MG tablet Commonly known as:  ZYLOPRIM take 2 tablets by mouth once daily   aspirin EC 81 MG tablet Take 81 mg by mouth daily.   cephALEXin 500 MG capsule Commonly known as:  KEFLEX Take 1 capsule (500 mg total) by mouth 2 (two) times daily.   doxycycline 100 MG tablet Commonly known as:  VIBRA-TABS Take 1 tablet (100 mg total) by mouth 2 (two) times daily.   finasteride 5 MG tablet Commonly known as:  PROSCAR Take 5 mg by mouth daily.   furosemide 20 MG tablet Commonly known as:  LASIX Take 1 tablet (20 mg total) by mouth daily as needed. For leg swelling   Potassium 95 MG Tabs Take 95 mg by mouth daily.   SSD 1 % cream Generic drug:  silver sulfADIAZINE apply to affected area once daily   Zinc 50 MG Tabs Take 50 mg by mouth daily.            Discharge Care Instructions        Start     Ordered   07/24/17 0000  Discharge instructions    Comments:  -We are prescribing PO antibiotics for you to take for five more weeks until Oct 9th. We are avoiding the Augmentin that you had side effects with before.  -The antibiotics are Keflex (take 500 mg twice a day) and Doxycycline (100 mg twice a day)  -Take Lasix 20 mg as needed for leg swelling. Use one tablet per day to see if it helps improve the swelling  -You have a follow up appointment at the clinic on Sept 10th at 10:15 am to see how you're doing -Continue taking great care of the wounds with the  dressing changes   07/24/17 1325   07/24/17 0000  cephALEXin (KEFLEX) 500 MG capsule  2 times daily     07/24/17 1325   07/24/17 0000  doxycycline (VIBRA-TABS) 100 MG tablet  2 times daily     07/24/17 1325   07/24/17 0000  furosemide (LASIX) 20 MG tablet  Daily PRN     07/24/17 1325   07/17/17 0000  Increase activity slowly     07/17/17 1652      Disposition and follow-up:   Vincent Miranda was discharged from [facility] in Stable condition.  At the hospital follow up visit please address:  1.  -Appearance of LE wounds and improvement  -Adherence to long term oral antibiotic course  -LE edema on prn lasix regimen  -Ensure Cr at baseline -Check Hgb and consider oral Fe   2.  Labs / imaging needed at time of follow-up: BMP, CBC  3.  Pending labs/ test needing follow-up: None  Follow-up Appointments: Follow-up Information    Gwynn Burly, DO. Go on 07/30/2017.   Specialty:  Internal Medicine Why:  At 10:15 AM Contact information: [address] [place] [address] [phone]  Hospital Course by problem list:  Chronic Osteomyelitis Chronic LE Wounds Pt has a long history of chronic LE wounds dating back several years, recurrent osteomyelitis, partial amputations with removed metatarsals on bilateral feet. He had previously declined amputation. He was most recently admitted for these wounds in May '18 for recurrent osteomyelitis, received IV abx but did not complete full course of PO due to side effects. He presented to the clinic with complaints of worsening wounds with increased size and drainage, XR with potential osteomyelitis in R metatarsal head. He was admitted for potential bone biopsy for directed abx therapy. Podiatry was consulted and did not feel this was an acute process and biopsy or other intervention was not indicated. Early in his hospital course, his wounds did not show purulent drainage or signs of infection. He  subsequently did develop purulent drainage and IV Vancomycin was initiated. After 48 hrs there was no significant change in wound appearance, Cefepime was added. His wounds and discharge did eventually improve, and he was discharged on a PO regimen of doxycyline 100 mg BID and Keflex 500 mg BID, to complete course on 10/9 for total of 6 weeks of antibiotics. In his case, anti-biotics are not a long term cure, but may be able to suppress bacterial burden to promote better healing.   His wife provides his care at home and does well with dressing changes and general wound care. Throughout the admission she declined opportunities for the pt to work with physical therapy, treat his hypertension, or for follow up at the wound center as an outpatient.    LE Edema Pt presented with worsened LE edema, had taken Lasix in the past which was discontinued in May after a hospitalization for hypotension. His wife had resumed it for current swelling using leftover prior prescription. His edema is likely contributing to difficulty with wound healing. On admission, Lasix PO 40 mg resumed with improvement in edema. After several days his Cr increased slightly above baseline, Lasix was held with improvement in kidney function. As edema re-accumulated he tolerated a one time dose of Lasix with improvement in swelling, no Cr increase. He was discharged with Lasix 20 mg supply to use prn for increased swelling.    Peripheral Arterial Disease Pt has known PAD based on prior ABI in 2016. ABI were repeated during this admission which showed similar reduction in arterial flow bilaterally. However, pt may not be a candidate for aggressive interventions. Further workup was not pursued as an inpatient in the setting of active infection. Further discussions may be held as an outpatient.     Anemia, microcytic Patient has a chronic microcytic anemia, previously attributed to CKD III and anemia of chronic disease. His baseline hemoglobin  appears to be 8-9. Labs on admission revealed hemoglobin 6.8, he received 1 unit packed red blood cells with improvement. Ferritin was checked and low normal at 24 in the setting of CKD. He was given IV iron. His hemoglobin remained stable for the remainder of the admission.   Discharge Vitals:   BP (!) 172/80 (BP Location: Left Arm)   Pulse 71   Temp 97.8 F (36.6 C) (Oral)   Resp 14   Ht 5\' 11"  (1.803 m)   Wt 182 lb 1.6 oz (82.6 kg)   SpO2 100%   BMI 25.40 kg/m   Pertinent Labs, Studies, and Procedures:  BMP Latest Ref Rng & Units 07/24/2017 07/22/2017 07/21/2017  Glucose 65 - 99 mg/dL 81 83 83  BUN 6 - 20  mg/dL 13 17 18   Creatinine 0.61 - 1.24 mg/dL 1.61(W1.44(H) 9.60(A1.57(H) 5.40(J1.82(H)  BUN/Creat Ratio 10 - 24 - - -  Sodium 135 - 145 mmol/L 144 145 146(H)  Potassium 3.5 - 5.1 mmol/L 3.5 3.4(L) 3.5  Chloride 101 - 111 mmol/L 113(H) 112(H) 112(H)  CO2 22 - 32 mmol/L 26 28 29   Calcium 8.9 - 10.3 mg/dL 9.5 9.3 9.2   CBC Latest Ref Rng & Units 07/24/2017 07/22/2017 07/21/2017  WBC 4.0 - 10.5 K/uL 6.5 6.4 7.4  Hemoglobin 13.0 - 17.0 g/dL 7.5(L) 8.0(L) 7.9(L)  Hematocrit 39.0 - 52.0 % 24.9(L) 26.7(L) 27.1(L)  Platelets 150 - 400 K/uL 257 284 265    Discharge Instructions: Discharge Instructions    Discharge instructions    Complete by:  As directed    -We are prescribing PO antibiotics for you to take for five more weeks until Oct 9th. We are avoiding the Augmentin that you had side effects with before.  -The antibiotics are Keflex (take 500 mg twice a day) and Doxycycline (100 mg twice a day)  -Take Lasix 20 mg as needed for leg swelling. Use one tablet per day to see if it helps improve the swelling  -You have a follow up appointment at the clinic on Sept 10th at 10:15 am to see how you're doing -Continue taking great care of the wounds with the dressing changes   Increase activity slowly    Complete by:  As directed       Signed: Ginger CarneHarden, Lillyana Majette, MD 07/24/2017, 1:49 PM   Pager: [phone]

## 2017-07-24 NOTE — Progress Notes (Signed)
Discharge instructions (including medications) discussed with and copy provided to patient/caregiver 

## 2017-07-24 NOTE — Progress Notes (Signed)
   Subjective: No acute events overnight, denies shortness of breath this morning, no other specific complaints. Wife is not in the room during rounds but will be updated by phone today.    Objective:  Vital signs in last 24 hours: Vitals:   07/23/17 0506 07/23/17 1500 07/23/17 2138 07/24/17 0640  BP: (!) 168/86 (!) 169/83 (!) 173/81 (!) 172/80  Pulse: 71 75 76 71  Resp: 17  19 14   Temp: 97.9 F (36.6 C) 98 F (36.7 C) 98.2 F (36.8 C) 97.8 F (36.6 C)  TempSrc: Oral Oral Oral Oral  SpO2: 96% 98% 98% 100%  Weight: 183 lb 12.8 oz (83.4 kg)   182 lb 1.6 oz (82.6 kg)  Height:       Physical Exam  Constitutional:  Elderly male resting in bed, laying comfortably in no acute distress   HENT:  Temporal wasting   Pulmonary/Chest: Effort normal. No respiratory distress.  Musculoskeletal:  Interval improvement in LE edema   Neurological: He is alert.  Skin:  Multiple chronic bilateral LE wounds, interval improvement in appearance of wounds, dressings partially covering wounds with decreased purulence on dressings    Assessment/Plan:  Osteomyelitis, R 4th Metatarsal Chronic LE Wounds, LE Edema   His chronic wounds have had continued purulent drainage, currently being treated with broad spectrum antibiotics. His edema improved with diuresis, though his Cr did have slight increase from his baseline which improved with gentle IVF, has returned to baseline and tolerated lower dose Lasix.  - Continue IV vancomycin and cefepime per pharmacy - Plan to transition to oral regimen of Keflex and Doxycycline on discharge   Peripheral Arterial Disease Poor arterial flow is likely a large component of his poor wound healing and recurrent infections. As anticipated, ABI and TBI show poor perfusion, however he is unlikely to be a candidate for vascular intervention.   Anemia,microcytic Previously attributed to a combination of his CKD III and anemia of chronic disease with his chronic wounds. His  baseline Hgb appears to be 8-9, initial labs revealed Hgb 6.8 on admission, received 1 U pRBCs with improvement.  Ferritin borderline low-normal at 24. His SOB may be related to down-trending Hgb and symptomatic anemia. Hgb currently 7.5.  - CBC daily   Shortness of Breath Pt intermittently complaining of SOB, lung exam with some crackles this morning. His sx may be related to atelectasis or symptomatic anemia as described above or some fluid buildup with holding Lasix. CXR reassuring, will continue to monitor  Dispo: Anticipated discharge in approximately 2-3 day(s).   Ginger CarneHarden, Karrie Fluellen, MD 07/24/2017, 7:29 AM Pager: [phone]

## 2017-07-24 NOTE — Progress Notes (Signed)
Internal Medicine Attending Note:  I saw and examined the patient on the day of discharge. I reviewed and agree with the discharge summary written by the house staff. Mild improvement of B/L LE ulcers, patient wishes to be discharged home today to continue palliative oral antibiotics.  Given his previous intolerance of Augmentin, I think we will opt for a combination of oral doxycycline and keflex.

## 2017-07-30 ENCOUNTER — Ambulatory Visit: Payer: Medicare Other

## 2017-08-01 ENCOUNTER — Other Ambulatory Visit: Payer: Self-pay | Admitting: Internal Medicine

## 2017-08-27 ENCOUNTER — Other Ambulatory Visit: Payer: Self-pay | Admitting: Internal Medicine

## 2017-09-05 ENCOUNTER — Telehealth: Payer: Self-pay | Admitting: *Deleted

## 2017-09-05 NOTE — Telephone Encounter (Signed)
When pt was called he stated would need to speak w/ wife and she works

## 2017-10-04 ENCOUNTER — Other Ambulatory Visit: Payer: Self-pay

## 2017-10-04 ENCOUNTER — Ambulatory Visit (INDEPENDENT_AMBULATORY_CARE_PROVIDER_SITE_OTHER): Payer: Medicare Other | Admitting: Internal Medicine

## 2017-10-04 ENCOUNTER — Encounter: Payer: Self-pay | Admitting: Internal Medicine

## 2017-10-04 DIAGNOSIS — I739 Peripheral vascular disease, unspecified: Secondary | ICD-10-CM

## 2017-10-04 DIAGNOSIS — M86672 Other chronic osteomyelitis, left ankle and foot: Secondary | ICD-10-CM

## 2017-10-04 DIAGNOSIS — Z87891 Personal history of nicotine dependence: Secondary | ICD-10-CM

## 2017-10-04 DIAGNOSIS — Z89421 Acquired absence of other right toe(s): Secondary | ICD-10-CM | POA: Diagnosis not present

## 2017-10-04 DIAGNOSIS — Z79899 Other long term (current) drug therapy: Secondary | ICD-10-CM | POA: Diagnosis not present

## 2017-10-04 DIAGNOSIS — Z89422 Acquired absence of other left toe(s): Secondary | ICD-10-CM | POA: Diagnosis not present

## 2017-10-04 DIAGNOSIS — I10 Essential (primary) hypertension: Secondary | ICD-10-CM | POA: Diagnosis not present

## 2017-10-04 DIAGNOSIS — M86671 Other chronic osteomyelitis, right ankle and foot: Secondary | ICD-10-CM

## 2017-10-04 DIAGNOSIS — M8660 Other chronic osteomyelitis, unspecified site: Secondary | ICD-10-CM

## 2017-10-04 MED ORDER — DOXYCYCLINE HYCLATE 50 MG PO CAPS: 100.0000 mg | ORAL_CAPSULE | Freq: Two times a day (BID) | ORAL | 0 refills | Status: DC

## 2017-10-04 MED ORDER — CEPHALEXIN 500 MG PO CAPS: ORAL_CAPSULE | ORAL | 0 refills | Status: DC

## 2017-10-04 MED ORDER — FUROSEMIDE 20 MG PO TABS: 40.0000 mg | ORAL_TABLET | Freq: Every day | ORAL | 1 refills | Status: DC

## 2017-10-04 MED ORDER — AMLODIPINE BESYLATE 2.5 MG PO TABS: 2.5000 mg | ORAL_TABLET | Freq: Every day | ORAL | 1 refills | Status: DC

## 2017-10-04 NOTE — Assessment & Plan Note (Signed)
Assessment: His BP is significantly elevated in the 170-180 range.  At home, there have been readings of greater than 200.  He is not on any BP medication at this time.  He is asymptomatic with this elevated BP and denies headache, CP, vision change.  Plan: - reinstate low dose amlodipine 2.5mg  daily - cautioned about monitoring for low BP as this has been a problem previously while on higher doses - educated regarding worse swelling that may occur with amlodipine

## 2017-10-04 NOTE — Patient Instructions (Signed)
Thank you for coming to see me today. It was a pleasure. Today we talked about:   Feet infection and swelling - take keflex 500mg  twice per day for the next 2 weeks - take doxycycline 100mg  twice per day for the next 2 weeks - for swelling, you can take an extra 20 mg Lasix in the afternoon if needed  Blood pressure: - we have started amlodipine at a very low dose of 2.5mg  daily.  Monitor for low blood pressure and possible worse leg swelling  Please follow-up with me in about 2 weeks and let us know how it is going.  If you need to come back sooner, please do so.  If you have any questions or concerns, please do not hesitate to call the office at [phone].  Take Care,   Vincent BurlyAndrew Graceanna Theissen, DO

## 2017-10-04 NOTE — Progress Notes (Signed)
CC: here for chronic lower extremity wounds and HTN  HPI:  Vincent Miranda is a 83 y.o. man with a past medical history listed below here today for follow up of his chronic wounds and HTN.   For details of today's visit and the status of his chronic medical issues please refer to the assessment and plan.   Past Medical History:  Diagnosis Date  . Acute blood loss anemia 10/18/2013  . Anemia of chronic disease   . Atrial fibrillation (HCC)   . Chronic kidney disease   . Chronic osteomyelitis (HCC)   . Gout   . History of blood transfusion    "related to anemia" (07/16/2017)  . Hypertension   . PVD (peripheral vascular disease) (HCC)    Review of Systems:  Please see pertinent ROS reviewed in HPI and problem based charting.   Physical Exam:  Vitals:   10/04/17 1542 10/04/17 1550  BP: (!) 183/74 (!) 174/88  Pulse: 100 93  Temp: 97.6 F (36.4 C)   TempSrc: Oral   SpO2: 95%   Weight: 173 lb 4.8 oz (78.6 kg)   Height: 5\' 10"  (1.778 m)    Physical Exam  Constitutional:  Elderly, AA man, NAD, alert  HENT:  Head: Normocephalic and atraumatic.  Eyes: Conjunctivae and EOM are normal.  Pulmonary/Chest: Effort normal.  Musculoskeletal: He exhibits edema and tenderness.  He has 1-2+ edema of bilateral lower extremities.  Skin:  He has extensive chronic wounds to both lower legs and feet with prior amputation.  There is white film bordering the left medial and right lateral wounds.  Pictures are provided in the media tab.     Assessment & Plan:   See Encounters Tab for problem based charting.  Patient discussed with Dr. Sandre Kittyaines.  Essential hypertension Assessment: His BP is significantly elevated in the 170-180 range.  At home, there have been readings of greater than 200.  He is not on any BP medication at this time.  He is asymptomatic with this elevated BP and denies headache, CP, vision change.  Plan: - reinstate low dose amlodipine 2.5mg  daily - cautioned  about monitoring for low BP as this has been a problem previously while on higher doses - educated regarding worse swelling that may occur with amlodipine   Chronic osteomyelitis (HCC) Assessment: Wife reports that they completed course of Keflex and Doxy in early October after hospitalization in September for osteomyelitis in the setting of PVD and chronic wounds.  She reports wounds did well while on antibiotics and certain parts were healing.  However, over the past several days she believes wounds have worsened and there has been more swelling in his legs.  He had a couple "water blisters" that have come up, there is more of a white film developing around the wound on his left medial foot and right lateral foot.  He is also complaining of a tightness across the dorsum of his left foot that comes and goes and is relieved with weight bearing.  Taking occasional tylenol and advil for pain relief.  Plan: - will restart a course of Keflex 500mg  BID and Doxy 100mg  BID for 2 weeks and reassess at that time.  Essentially treating wounds with palliative care at this point so long term suppressive treatment may be needed - regarding the increased edema, wife reports that she is giving him 40mg  daily of Lasix.  Advised that she can give an extra 20mg  as needed in the afternoon for worse swelling -  continue with wound care and wrapping as she has been doing

## 2017-10-04 NOTE — Assessment & Plan Note (Signed)
Assessment: Wife reports that they completed course of Keflex and Doxy in early October after hospitalization in September for osteomyelitis in the setting of PVD and chronic wounds.  She reports wounds did well while on antibiotics and certain parts were healing.  However, over the past several days she believes wounds have worsened and there has been more swelling in his legs.  He had a couple "water blisters" that have come up, there is more of a white film developing around the wound on his left medial foot and right lateral foot.  He is also complaining of a tightness across the dorsum of his left foot that comes and goes and is relieved with weight bearing.  Taking occasional tylenol and advil for pain relief.  Plan: - will restart a course of Keflex 500mg  BID and Doxy 100mg  BID for 2 weeks and reassess at that time.  Essentially treating wounds with palliative care at this point so long term suppressive treatment may be needed - regarding the increased edema, wife reports that she is giving him 40mg  daily of Lasix.  Advised that she can give an extra 20mg  as needed in the afternoon for worse swelling - continue with wound care and wrapping as she has been doing

## 2017-10-05 NOTE — Progress Notes (Signed)
Internal Medicine Clinic Attending  Case discussed with Dr. Wallace  at the time of the visit.  We reviewed the resident's history and exam and pertinent patient test results.  I agree with the assessment, diagnosis, and plan of care documented in the resident's note.  Alexander N Raines, MD   

## 2017-10-10 ENCOUNTER — Other Ambulatory Visit: Payer: Self-pay | Admitting: *Deleted

## 2017-10-10 MED ORDER — SILVER SULFADIAZINE 1 % EX CREA: TOPICAL_CREAM | CUTANEOUS | 1 refills | Status: DC

## 2017-10-18 ENCOUNTER — Other Ambulatory Visit: Payer: Self-pay | Admitting: *Deleted

## 2017-10-18 NOTE — Telephone Encounter (Signed)
Call from pt's wife - states pt saw Dr Earlene PlaterWallace about 2 weeks ago and started on abx's. They were instructed to call back to let Dr Earlene PlaterWallace know how abx's are working. She says pt's infection of his feet is not any worse but has not completely cleared. He has plenty of Doxycycline but only 3 tabs left of Keflex. She's not sure if he was going to order more once he  heard back from pt. Thanks

## 2017-10-18 NOTE — Telephone Encounter (Signed)
I will defer the decision to Dr. Earlene PlaterWallace upon his return after the weekend.  He should complete the course of Keflex in the interim and continue the doxycycline.

## 2017-10-20 IMAGING — DX DG ANKLE COMPLETE 3+V*L*
3 series · 3 of 3 positions shown · non-contrast
Comparison: Left tib-fib 12/08/2016

CLINICAL DATA: Possible sepsis. Chronic ulcers to the feet and
ankles. Shortness of breath.

EXAM:
LEFT ANKLE COMPLETE - 3+ VIEW

[ankle ap]
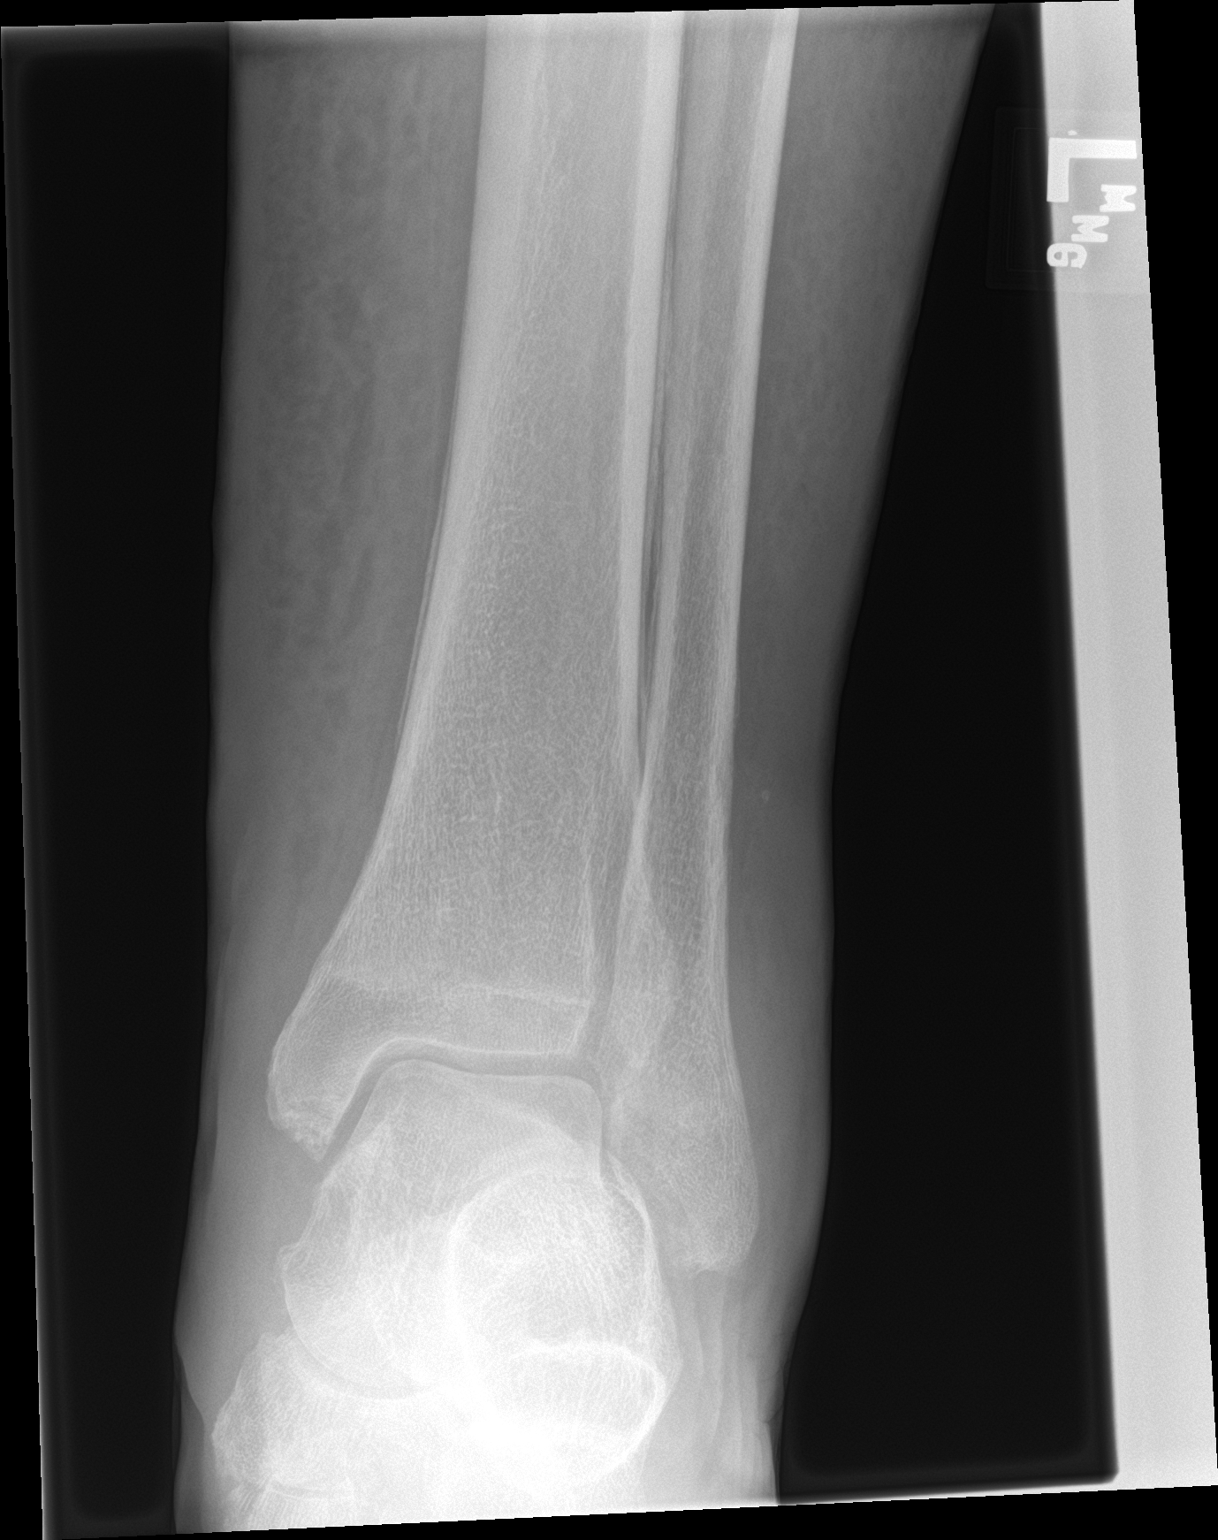

[ankle obl]
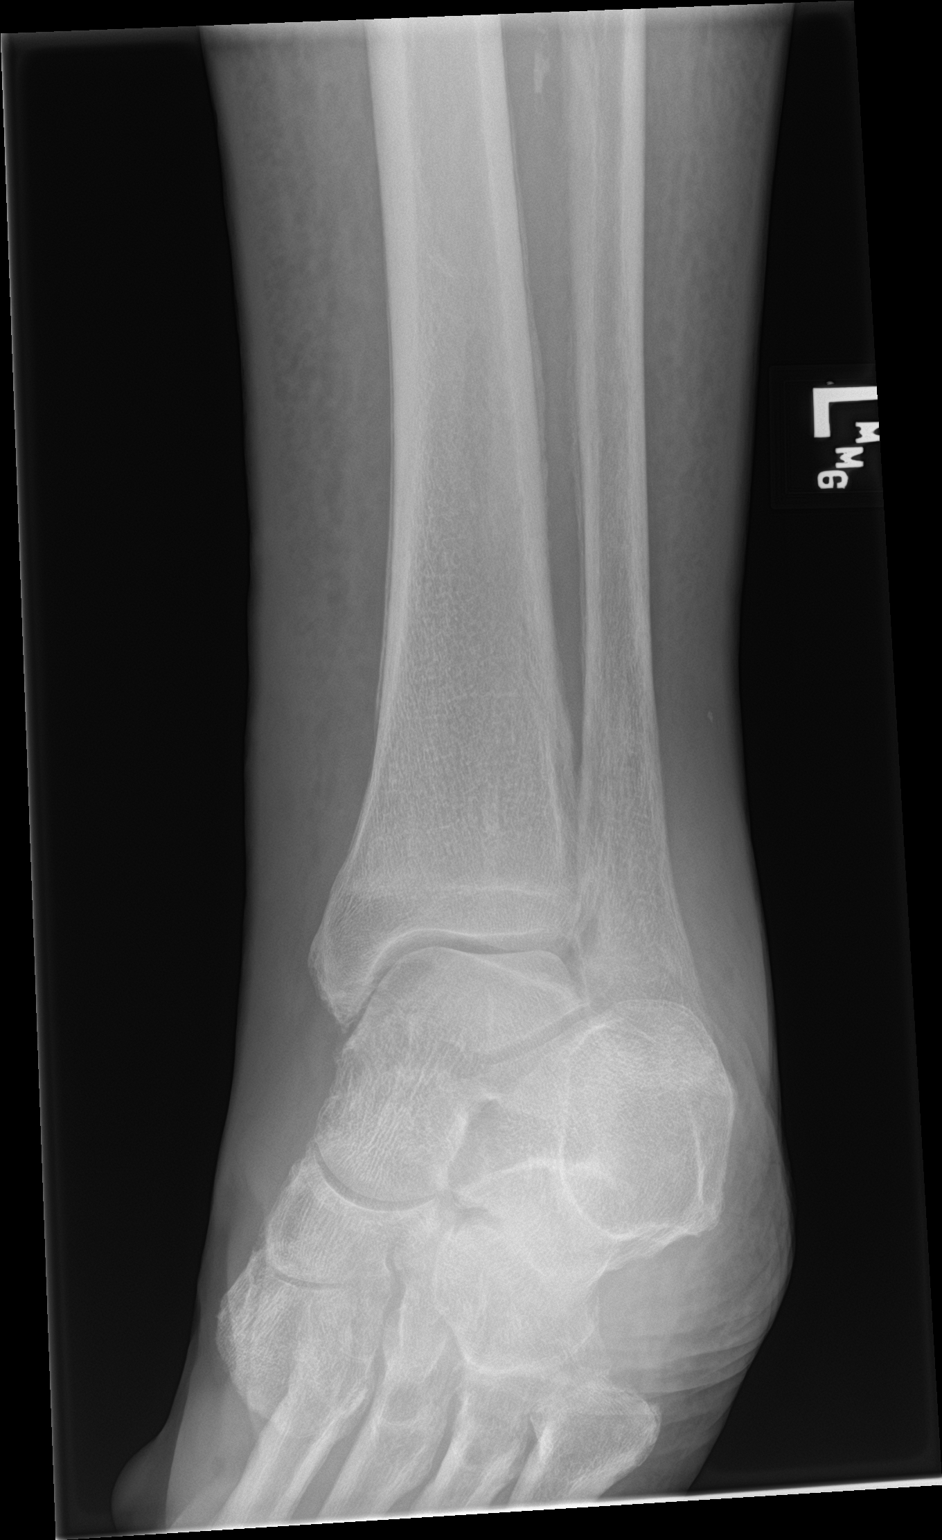

[ankle lat]
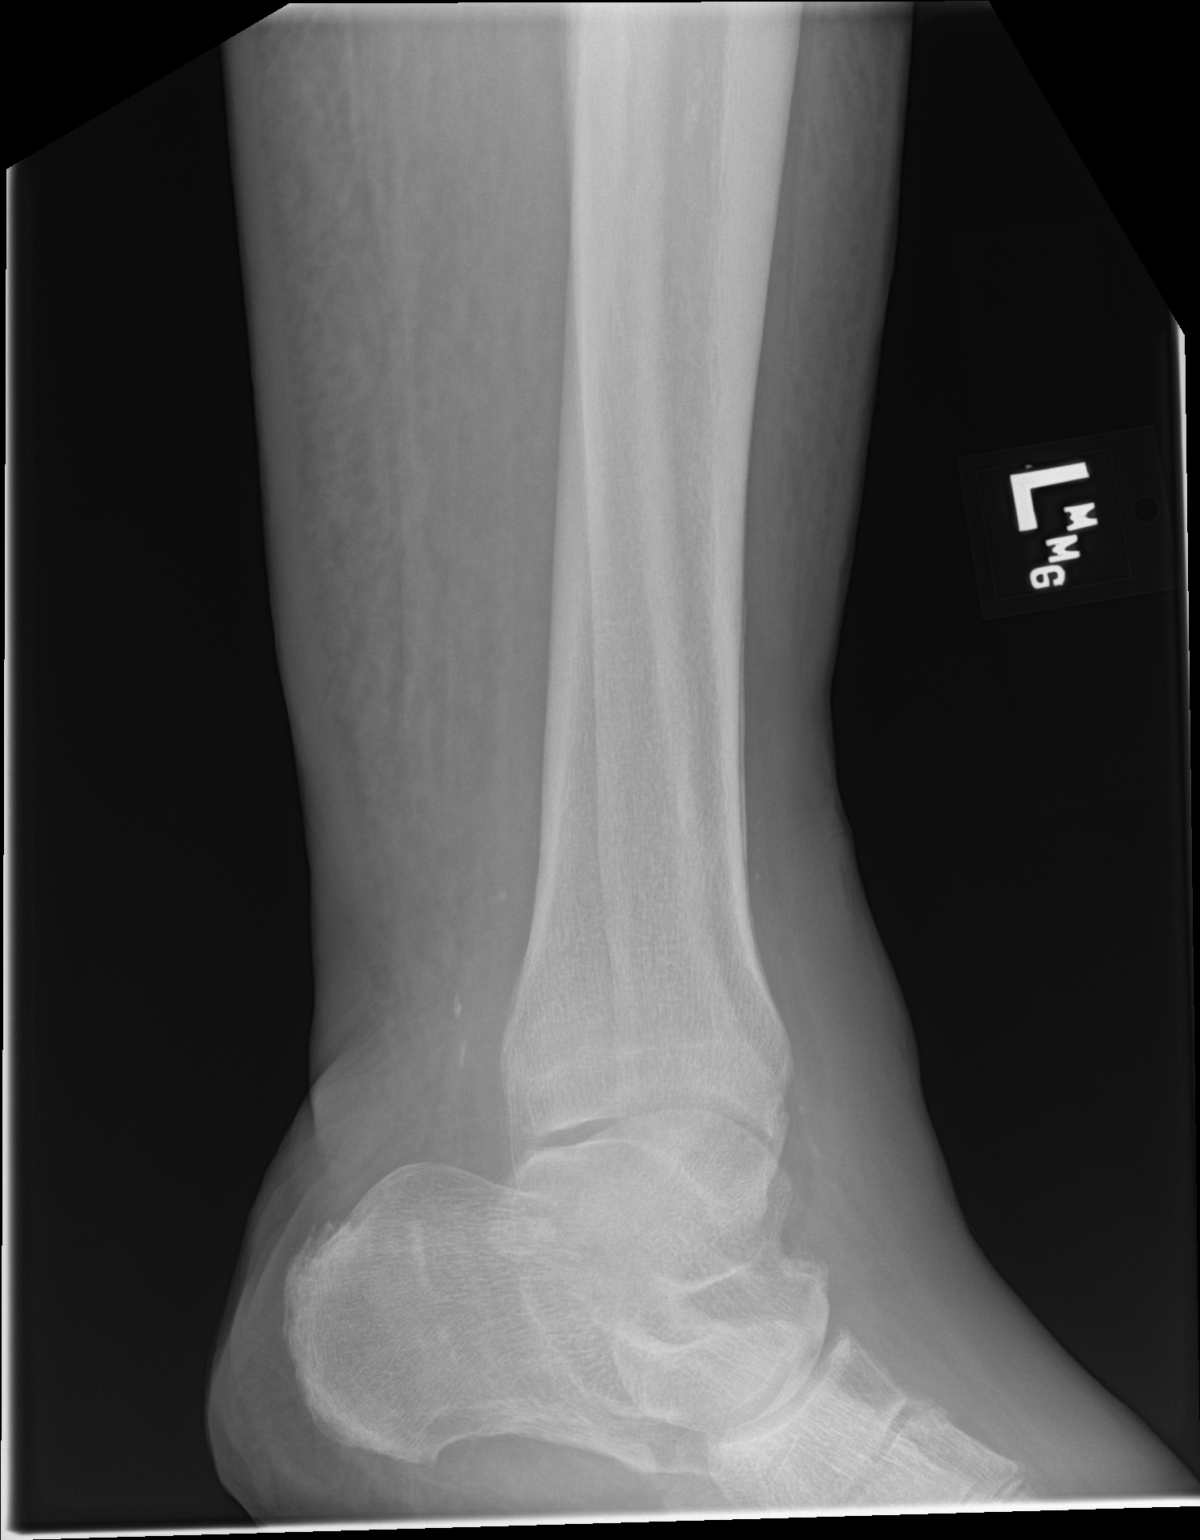

[3 of 3 positions shown; findings below may reference images not displayed]

FINDINGS: There is periosteal reaction along the distal right tibia and
fibula, similar to previous study. This likely represents reactive
bone. No evidence of bone erosion to suggest osteomyelitis. Diffuse
soft tissue swelling and edema. No radiopaque foreign bodies or soft
tissue gas collections. Vascular calcifications. Degenerative
changes in the ankle joint.
IMPRESSION: Chronic periosteal reaction along the distal tibia and fibula.
Diffuse soft tissue swelling and edema. No definite evidence of
osteomyelitis.

## 2017-10-22 ENCOUNTER — Other Ambulatory Visit: Payer: Self-pay | Admitting: Internal Medicine

## 2017-10-22 MED ORDER — CEPHALEXIN 500 MG PO CAPS: ORAL_CAPSULE | ORAL | 0 refills | Status: DC

## 2017-10-22 MED ORDER — DOXYCYCLINE HYCLATE 50 MG PO CAPS: 100.0000 mg | ORAL_CAPSULE | Freq: Two times a day (BID) | ORAL | 0 refills | Status: DC

## 2017-10-22 NOTE — Telephone Encounter (Signed)
Called pt's wife Okey RegalCarol - no answer; left message "refilled keflex and doxy for 2 more weeks to complete a 4 week course of treatment.  Rx sent to [facility] [address]" per Dr Dr Earlene PlaterWallace and call back for any questions.

## 2017-10-22 NOTE — Telephone Encounter (Signed)
Pt's wife states they uses [facility] not [facility]. Rxs called to [facility]. Also stated he has plenty of Doxycycline- I asked how he's taking it, stated 1 pill BID. Informed should be 50 mg take 2 pills BID - stated she did not realized but will start giving it as prescribed.

## 2017-10-22 NOTE — Telephone Encounter (Signed)
I have refilled keflex and doxy for 2 more weeks to complete a 4 week course of treatment.  Rx sent to [facility] [address].

## 2017-11-23 ENCOUNTER — Other Ambulatory Visit: Payer: Self-pay | Admitting: *Deleted

## 2017-11-23 MED ORDER — SILVER SULFADIAZINE 1 % EX CREA: TOPICAL_CREAM | CUTANEOUS | 1 refills | Status: DC

## 2017-12-31 ENCOUNTER — Other Ambulatory Visit: Payer: Self-pay | Admitting: *Deleted

## 2017-12-31 MED ORDER — SILVER SULFADIAZINE 1 % EX CREA: TOPICAL_CREAM | CUTANEOUS | 3 refills | Status: DC

## 2018-01-10 ENCOUNTER — Other Ambulatory Visit: Payer: Self-pay

## 2018-01-10 ENCOUNTER — Ambulatory Visit (INDEPENDENT_AMBULATORY_CARE_PROVIDER_SITE_OTHER): Payer: Medicare Other | Admitting: Internal Medicine

## 2018-01-10 ENCOUNTER — Encounter: Payer: Self-pay | Admitting: Internal Medicine

## 2018-01-10 DIAGNOSIS — L97929 Non-pressure chronic ulcer of unspecified part of left lower leg with unspecified severity: Secondary | ICD-10-CM

## 2018-01-10 DIAGNOSIS — L97919 Non-pressure chronic ulcer of unspecified part of right lower leg with unspecified severity: Secondary | ICD-10-CM | POA: Diagnosis not present

## 2018-01-10 DIAGNOSIS — Z89421 Acquired absence of other right toe(s): Secondary | ICD-10-CM | POA: Diagnosis not present

## 2018-01-10 DIAGNOSIS — M8660 Other chronic osteomyelitis, unspecified site: Secondary | ICD-10-CM

## 2018-01-10 DIAGNOSIS — I739 Peripheral vascular disease, unspecified: Secondary | ICD-10-CM

## 2018-01-10 DIAGNOSIS — Z87891 Personal history of nicotine dependence: Secondary | ICD-10-CM

## 2018-01-10 DIAGNOSIS — M86662 Other chronic osteomyelitis, left tibia and fibula: Secondary | ICD-10-CM

## 2018-01-10 DIAGNOSIS — M86661 Other chronic osteomyelitis, right tibia and fibula: Secondary | ICD-10-CM | POA: Diagnosis not present

## 2018-01-10 DIAGNOSIS — Z89422 Acquired absence of other left toe(s): Secondary | ICD-10-CM

## 2018-01-10 MED ORDER — DOXYCYCLINE HYCLATE 50 MG PO CAPS: 100.0000 mg | ORAL_CAPSULE | Freq: Two times a day (BID) | ORAL | 0 refills | Status: DC

## 2018-01-10 MED ORDER — CEPHALEXIN 500 MG PO CAPS: ORAL_CAPSULE | ORAL | 0 refills | Status: DC

## 2018-01-10 NOTE — Assessment & Plan Note (Signed)
He has bilateral blistering skin deformities with underlying chronic osteomyelitis which his wife has been managing with regular cleaning and dressing changes. Since December the wife has noticed that he's had increased drainage and edema in the legs. She did not start the extra lasix as needed yet because she wasn't sure about when to dose this. He does not have symptoms of nausea or chills at this time.  Chronic osteomyelitis secondary to chronic lower extremity wounds in the setting of untreated peripheral vascular disease.  - lasix 60 mg every morning daily for the next week then continue with as needed dose added to morning prescription when his edema becomes worse  - prescribed 2 week course of doxycycline 100 mg BID and keflex 500 mg BID  - return to clinic in one week for wound check

## 2018-01-10 NOTE — Progress Notes (Signed)
   CC: Foot pain    HPI:  Vincent Miranda is a 84 y.o. with PMH as listed below who presents for follow up of chronic osteomyelitis of the lower extremities. Please see the assessment and plans for the status of the patient chronic medical problems.    Past Medical History:  Diagnosis Date  . Acute blood loss anemia 10/18/2013  . Anemia of chronic disease   . Atrial fibrillation (HCC)   . Chronic kidney disease   . Chronic osteomyelitis (HCC)   . Gout   . History of blood transfusion    "related to anemia" (07/16/2017)  . Hypertension   . PVD (peripheral vascular disease) (HCC)    Review of Systems:  Refer to history of present illness and assessment and plans for pertinent review of systems, all others reviewed and negative  Physical Exam:  Vitals:   01/10/18 1330  BP: (!) 158/78  Pulse: 95  Temp: 98 F (36.7 C)  TempSrc: Oral  SpO2: 100%  Weight: 176 lb (79.8 kg)  Height: 5\' 10"  (1.778 m)   General: no acute distress  Cardiac: regular rate and rhythm, 1+ bilateral lower extremity pitting edema, no calf tenderness  Pulm: no respiratory distress, lungs are clear to auscultation  Skin: ulcerations and skin sloughing of bilateral lower extremities, there is serous drainage coming from the large wound of his medial left foot, the surrounding skin is edematous but not warm to touch or erythematous.        Assessment & Plan:   Foot infection and swelling  He has bilateral blistering skin deformities with underlying chronic osteomyelitis which his wife has been managing with regular cleaning and dressing changes. Since December the wife has noticed that he's had increased drainage and edema in the legs. She did not start the extra lasix as needed yet because she wasn't sure about when to dose this. He does not have symptoms of nausea or chills at this time.  Chronic osteomyelitis secondary to chronic lower extremity wounds in the setting of untreated peripheral  vascular disease.  - lasix 60 mg every morning daily for the next week then continue with as needed dose added to morning prescription when his edema becomes worse  - prescribed 2 week course of doxycycline 100 mg BID and keflex 500 mg BID  - return to clinic in one week for wound check   See Encounters Tab for problem based charting.  Patient discussed with Dr. Heide SparkNarendra

## 2018-01-10 NOTE — Patient Instructions (Addendum)
Thank you for coming to the clinic today. It was a pleasure to see you.   For your maternity wounds and swelling Please take Keflex and doxycycline twice daily for the next 2 weeks Please schedule a follow-up in the acute care clinic next week Please take Lasix 60 mg daily until the appointment next week  FOLLOW-UP INSTRUCTIONS When: 1 week in the acute care clinic For: Follow upper and lower extremity wounds What to bring: all of your medication bottles   Please call our clinic if you have any questions or concerns, we may be able to help and keep you from a long and expensive emergency room wait. Our clinic and after hours phone number is [phone], there is always someone available.

## 2018-01-14 NOTE — Progress Notes (Signed)
Internal Medicine Clinic Attending  Case discussed with Dr. Blum at the time of the visit.  We reviewed the resident's history and exam and pertinent patient test results.  I agree with the assessment, diagnosis, and plan of care documented in the resident's note. 

## 2018-01-18 ENCOUNTER — Telehealth: Payer: Self-pay | Admitting: Internal Medicine

## 2018-01-18 ENCOUNTER — Other Ambulatory Visit: Payer: Self-pay | Admitting: Internal Medicine

## 2018-01-18 MED ORDER — DOXYCYCLINE HYCLATE 50 MG PO CAPS: 100.0000 mg | ORAL_CAPSULE | Freq: Two times a day (BID) | ORAL | 0 refills | Status: DC

## 2018-01-18 NOTE — Telephone Encounter (Signed)
Pls call patient wife, she is calling about some medicine for pt. She said she called on Wednesday and no one called her back

## 2018-01-18 NOTE — Telephone Encounter (Signed)
Called pt's wife - no answer; left message to give us a call back.

## 2018-01-18 NOTE — Telephone Encounter (Signed)
Pt's wife states pt has finished taking Doxycycline and wants to know if he could get a refill to last until he's seen by Dr Earlene PlaterWallace on the 14th. Taking Keflex.  Thanks

## 2018-01-18 NOTE — Telephone Encounter (Signed)
Yes, I can refill this for his chronic osteomyelitis until he follows up with me this month.  Thanks.

## 2018-01-18 NOTE — Telephone Encounter (Signed)
Pt's wife informed of refill - stated thank-you.

## 2018-01-29 ENCOUNTER — Other Ambulatory Visit: Payer: Self-pay | Admitting: Internal Medicine

## 2018-01-31 ENCOUNTER — Ambulatory Visit (INDEPENDENT_AMBULATORY_CARE_PROVIDER_SITE_OTHER): Payer: Medicare Other | Admitting: Internal Medicine

## 2018-01-31 ENCOUNTER — Other Ambulatory Visit: Payer: Self-pay

## 2018-01-31 ENCOUNTER — Telehealth: Payer: Self-pay

## 2018-01-31 ENCOUNTER — Encounter: Payer: Self-pay | Admitting: Internal Medicine

## 2018-01-31 VITALS — BP 145/67 | HR 86 | Temp 97.9°F | Ht 70.0 in | Wt 169.5 lb

## 2018-01-31 DIAGNOSIS — Z79899 Other long term (current) drug therapy: Secondary | ICD-10-CM

## 2018-01-31 DIAGNOSIS — N189 Chronic kidney disease, unspecified: Secondary | ICD-10-CM

## 2018-01-31 DIAGNOSIS — Z9114 Patient's other noncompliance with medication regimen: Secondary | ICD-10-CM | POA: Diagnosis not present

## 2018-01-31 DIAGNOSIS — I739 Peripheral vascular disease, unspecified: Secondary | ICD-10-CM | POA: Diagnosis not present

## 2018-01-31 DIAGNOSIS — D631 Anemia in chronic kidney disease: Secondary | ICD-10-CM

## 2018-01-31 DIAGNOSIS — I10 Essential (primary) hypertension: Secondary | ICD-10-CM

## 2018-01-31 DIAGNOSIS — M8660 Other chronic osteomyelitis, unspecified site: Secondary | ICD-10-CM

## 2018-01-31 DIAGNOSIS — M86661 Other chronic osteomyelitis, right tibia and fibula: Secondary | ICD-10-CM

## 2018-01-31 DIAGNOSIS — D638 Anemia in other chronic diseases classified elsewhere: Secondary | ICD-10-CM | POA: Diagnosis not present

## 2018-01-31 DIAGNOSIS — Z87891 Personal history of nicotine dependence: Secondary | ICD-10-CM

## 2018-01-31 DIAGNOSIS — I129 Hypertensive chronic kidney disease with stage 1 through stage 4 chronic kidney disease, or unspecified chronic kidney disease: Secondary | ICD-10-CM | POA: Diagnosis not present

## 2018-01-31 DIAGNOSIS — M86662 Other chronic osteomyelitis, left tibia and fibula: Secondary | ICD-10-CM

## 2018-01-31 MED ORDER — CEPHALEXIN 500 MG PO CAPS: ORAL_CAPSULE | ORAL | 0 refills | Status: DC

## 2018-01-31 NOTE — Progress Notes (Signed)
CC: f/u of PVD and chronic wounds  HPI:  [name] is a 84 y.o. man with a past medical history listed below here today for follow up of his chronic wounds.   For details of today's visit and the status of his chronic medical issues please refer to the assessment and plan.   Past Medical History:  Diagnosis Date  . Acute blood loss anemia 10/18/2013  . Anemia of chronic disease   . Atrial fibrillation (HCC)   . Chronic kidney disease   . Chronic osteomyelitis (HCC)   . Gout   . History of blood transfusion    "related to anemia" (07/16/2017)  . Hypertension   . PVD (peripheral vascular disease) (HCC)    Review of Systems:   Please see pertinent ROS reviewed in HPI and problem based charting.   Physical Exam:  Vitals:   01/31/18 1601  BP: (!) 145/67  Pulse: 86  Temp: 97.9 F (36.6 C)  TempSrc: Oral  SpO2: 100%  Weight: 169 lb 8 oz (76.9 kg)  Height: 5\' 10"  (1.778 m)   Physical Exam  Constitutional: He is well-developed, well-nourished, and in no distress.  HENT:  Head: Normocephalic and atraumatic.  Skin:  Numerous ulcerations and skin sloughing of bilateral lower extremities.  There is serous drainage coming from the large wound of his medial left foot, the surrounding skin is somewhat edematous but not warm to touch or very erythematous.  There is some odor but not overly malodorous.   Psychiatric: Mood and affect normal.     Assessment & Plan:   See Encounters Tab for problem based charting.  Patient discussed with Dr. Josem Kaufmann.  Chronic osteomyelitis (HCC) He continues to have non-healing wounds of his lower extremities due to peripheral vascular disease.  He has completed course of Keflex and has a few pills left on the Doxy as prescribed by Dr Obie Dredge last month.  His wife has also been giving him 60mg  of Lasix per day to help with swelling.  His wife thinks this combined antibiotic and lasix treatment has been effective but she still notes some  drainage from the wounds.  The "water blisters" on his anterior shins have resolved.  She believes the Keflex to be more effective than Doxy.  Per review, it does not appear that he has had nasal PCR positive for MRSA.  Plan: - BMET today to assess his CKD and electrolytes with increased lasix dosing returned with normal potassium, but creatinine elevated from baseline 1.5-1.7.  I have called his wife and discussed results.  She attributes some of his rise in creatinine to his decreased fluid intake as well.  I have asked her to go back to the 40mg  lasix that is prescribed and reserve an extra 20mg  to be given as needed. - We will repeat a BMET when he next follows up - We will stop Doxy as he should have completed this course by now - We will provide 2 more weeks of Keflex and reassess at that point with report from his wife who provides daily wound care and dressing changes  Anemia of chronic disease We checked a CBC today to ensure his hemoglobin is stable and it was better than last checked at 9.7.  His wife was updated.   Essential hypertension His BP today is 145/67.  He is not taking his amlodipine as prescribed.  They are not interested in further BP treatment for the time being.  Plan: - will DC amlodipine  given they are not taking and per preference.  I think a BP goal less than 150 is completely reasonable in this 84 yo patient given his other chronic illnesses.

## 2018-01-31 NOTE — Patient Instructions (Signed)
Thank you for coming to see me today. It was a pleasure. Today we talked about:   Foot wounds - STOP the doxycycline at this point. - Let's do 2 more weeks of Keflex and then see how things look.  I will let you tell us if you think they look more infected and prescribe antibiotics if needed at that point  Please follow-up with us in 3-6 months or sooner if needed.   If you have any questions or concerns, please do not hesitate to call the office at [phone].  Take Care,   Gwynn BurlyAndrew Olman Yono, DO

## 2018-02-01 ENCOUNTER — Encounter: Payer: Self-pay | Admitting: Internal Medicine

## 2018-02-01 LAB — BMP8+ANION GAP
Anion Gap: 14 mmol/L (ref 10.0–18.0)
BUN / CREAT RATIO: 16 (ref 10–24)
BUN: 33 mg/dL (ref 10–36)
CO2: 24 mmol/L (ref 20–29)
CREATININE: 2.03 mg/dL — AB (ref 0.76–1.27)
Calcium: 9.5 mg/dL (ref 8.6–10.2)
Chloride: 106 mmol/L (ref 96–106)
GFR calc Af Amer: 32 mL/min/{1.73_m2} — ABNORMAL LOW (ref >59)
GFR, EST NON AFRICAN AMERICAN: 28 mL/min/{1.73_m2} — AB (ref >59)
Glucose: 103 mg/dL — ABNORMAL HIGH (ref 65–99)
Potassium: 3.6 mmol/L (ref 3.5–5.2)
SODIUM: 144 mmol/L (ref 134–144)

## 2018-02-01 LAB — CBC
Hematocrit: 30.3 % — ABNORMAL LOW (ref 37.5–51.0)
Hemoglobin: 9.7 g/dL — ABNORMAL LOW (ref 13.0–17.7)
MCH: 26.9 pg (ref 26.6–33.0)
MCHC: 32 g/dL (ref 31.5–35.7)
MCV: 84 fL (ref 79–97)
PLATELETS: 286 10*3/uL (ref 150–379)
RBC: 3.6 x10E6/uL — ABNORMAL LOW (ref 4.14–5.80)
RDW: 16.5 % — AB (ref 12.3–15.4)
WBC: 10.5 10*3/uL (ref 3.4–10.8)

## 2018-02-01 NOTE — Assessment & Plan Note (Signed)
He continues to have non-healing wounds of his lower extremities due to peripheral vascular disease.  He has completed course of Keflex and has a few pills left on the Doxy as prescribed by Dr Obie DredgeBlum last month.  His wife has also been giving him 60mg  of Lasix per day to help with swelling.  His wife thinks this combined antibiotic and lasix treatment has been effective but she still notes some drainage from the wounds.  The "water blisters" on his anterior shins have resolved.  She believes the Keflex to be more effective than Doxy.  Per review, it does not appear that he has had nasal PCR positive for MRSA.  Plan: - BMET today to assess his CKD and electrolytes with increased lasix dosing returned with normal potassium, but creatinine elevated from baseline 1.5-1.7.  I have called his wife and discussed results.  She attributes some of his rise in creatinine to his decreased fluid intake as well.  I have asked her to go back to the 40mg  lasix that is prescribed and reserve an extra 20mg  to be given as needed. - We will repeat a BMET when he next follows up - We will stop Doxy as he should have completed this course by now - We will provide 2 more weeks of Keflex and reassess at that point with report from his wife who provides daily wound care and dressing changes

## 2018-02-01 NOTE — Assessment & Plan Note (Signed)
We checked a CBC today to ensure his hemoglobin is stable and it was better than last checked at 9.7.  His wife was updated.

## 2018-02-01 NOTE — Assessment & Plan Note (Signed)
His BP today is 145/67.  He is not taking his amlodipine as prescribed.  They are not interested in further BP treatment for the time being.  Plan: - will DC amlodipine given they are not taking and per preference.  I think a BP goal less than 150 is completely reasonable in this 84 yo patient given his other chronic illnesses.

## 2018-02-04 NOTE — Progress Notes (Signed)
Case discussed with Dr. Wallace soon after the resident saw the patient.  We reviewed the resident's history and exam and pertinent patient test results.  I agree with the assessment, diagnosis and plan of care documented in the resident's note. 

## 2018-02-20 ENCOUNTER — Other Ambulatory Visit: Payer: Self-pay | Admitting: Internal Medicine

## 2018-02-27 ENCOUNTER — Other Ambulatory Visit: Payer: Self-pay | Admitting: Internal Medicine

## 2018-02-27 ENCOUNTER — Telehealth: Payer: Self-pay | Admitting: *Deleted

## 2018-02-27 MED ORDER — FUROSEMIDE 20 MG PO TABS
ORAL_TABLET | ORAL | 0 refills | Status: DC
Start: 2018-02-27 — End: 2018-03-28

## 2018-02-27 NOTE — Telephone Encounter (Signed)
I have sent in script for PRN 20mg  Lasix dose as her and I discussed at his follow up visit last month when he has extra swelling. Thanks!

## 2018-02-27 NOTE — Telephone Encounter (Signed)
Pt's wife/ caregiver calls and states pt needs to take the extra 20 mg at this time due to his leg edema, she states he has started getting small blisters and would like to start this asap. The 20mg  is not on medlist please send a script to [facility] on [address]. If need to speak w/ her you may call [phone], this is her work #Research scientist (life sciences)

## 2018-03-12 ENCOUNTER — Telehealth: Payer: Self-pay | Admitting: Internal Medicine

## 2018-03-12 NOTE — Telephone Encounter (Signed)
Patient wife is requesting refill drainage on his feet and she also want to see if he can get some antiboitic. Pls send to [facility] [address]

## 2018-03-12 NOTE — Telephone Encounter (Signed)
Rtc, pt himself answered ph, stated his wife is at work and she had already spoken to someone about the problem.

## 2018-03-28 ENCOUNTER — Other Ambulatory Visit: Payer: Self-pay | Admitting: Internal Medicine

## 2018-04-10 DIAGNOSIS — R031 Nonspecific low blood-pressure reading: Secondary | ICD-10-CM | POA: Diagnosis not present

## 2018-04-11 ENCOUNTER — Other Ambulatory Visit: Payer: Self-pay

## 2018-04-11 MED ORDER — CEPHALEXIN 500 MG PO CAPS: ORAL_CAPSULE | ORAL | 0 refills | Status: DC

## 2018-04-11 NOTE — Telephone Encounter (Signed)
  Requesting cephALEXin (KEFLEX) 500 MG capsule and antibiotic to be filled. Please call pt back.

## 2018-04-11 NOTE — Telephone Encounter (Signed)
Called spouse, made appt for 6/5 [facility]

## 2018-04-11 NOTE — Telephone Encounter (Signed)
I have refilled his Keflex for 2 weeks.  I'd like for him to be seen in clinic [facility] to have wounds re-evaluated to see if extended course is necessary and to discuss long-term suppressive antibiotics if his wife is agreeable since it has been a couple months since his last visit.  I did not refill any Flexeril prescription as I do not see this as a medication that we have prescribed in the past for him.  Thanks.

## 2018-04-24 ENCOUNTER — Encounter: Payer: Self-pay | Admitting: Internal Medicine

## 2018-04-24 ENCOUNTER — Ambulatory Visit (INDEPENDENT_AMBULATORY_CARE_PROVIDER_SITE_OTHER): Payer: Medicare Other | Admitting: Internal Medicine

## 2018-04-24 ENCOUNTER — Other Ambulatory Visit: Payer: Self-pay

## 2018-04-24 DIAGNOSIS — N183 Chronic kidney disease, stage 3 unspecified: Secondary | ICD-10-CM

## 2018-04-24 DIAGNOSIS — M86662 Other chronic osteomyelitis, left tibia and fibula: Secondary | ICD-10-CM | POA: Diagnosis not present

## 2018-04-24 DIAGNOSIS — I4891 Unspecified atrial fibrillation: Secondary | ICD-10-CM | POA: Diagnosis not present

## 2018-04-24 DIAGNOSIS — M8660 Other chronic osteomyelitis, unspecified site: Secondary | ICD-10-CM

## 2018-04-24 DIAGNOSIS — M86661 Other chronic osteomyelitis, right tibia and fibula: Secondary | ICD-10-CM

## 2018-04-24 DIAGNOSIS — I129 Hypertensive chronic kidney disease with stage 1 through stage 4 chronic kidney disease, or unspecified chronic kidney disease: Secondary | ICD-10-CM | POA: Diagnosis not present

## 2018-04-24 DIAGNOSIS — Z792 Long term (current) use of antibiotics: Secondary | ICD-10-CM | POA: Diagnosis not present

## 2018-04-24 DIAGNOSIS — Z87891 Personal history of nicotine dependence: Secondary | ICD-10-CM | POA: Diagnosis not present

## 2018-04-24 DIAGNOSIS — I739 Peripheral vascular disease, unspecified: Secondary | ICD-10-CM | POA: Diagnosis not present

## 2018-04-24 DIAGNOSIS — Z79899 Other long term (current) drug therapy: Secondary | ICD-10-CM | POA: Diagnosis not present

## 2018-04-24 MED ORDER — CEPHALEXIN 500 MG PO CAPS: 500.0000 mg | ORAL_CAPSULE | Freq: Two times a day (BID) | ORAL | 0 refills | Status: DC

## 2018-04-24 MED ORDER — FUROSEMIDE 20 MG PO TABS: ORAL_TABLET | ORAL | 0 refills | Status: DC

## 2018-04-24 MED ORDER — FUROSEMIDE 40 MG PO TABS: 40.0000 mg | ORAL_TABLET | Freq: Every day | ORAL | 2 refills | Status: DC

## 2018-04-24 NOTE — Patient Instructions (Signed)
FOLLOW-UP INSTRUCTIONS When: 3 months For: follow up wound infections What to bring: medications   Vincent Miranda,  It was good to see you today.  We are going to check some labs to make sure your kidneys are doing okay. I will let you know the results when I get them back.  For the lower extremity wounds, please continue what you have been doing as far as dressing changes and wound care. I have also prescribed another 2 weeks of Keflex to be taken twice a day. Please also continue with the current lasix dose of 40mg  daily and an extra 20mg  if needed.  Please return to our clinic in 3-6 months or sooner if needed.  If you have any questions or concerns, call our clinic at [phone] or after hours call [phone] and ask for the internal medicine resident on call.

## 2018-04-24 NOTE — Assessment & Plan Note (Addendum)
Assessment Cr elevated to 2.03 on last visit in 01/2018, above baseline of ~1.5-1.7. Patient's lasix 60mg  daily was decreased to lasix 40mg  daily and 20mg  as needed. No urinary symptoms.  Plan - Recheck BMP today - Continue lasix 40mg  daily, and extra 20mg  daily as needed  ADDENDUM: BMP with improved creatinine at 1.8 compared to 2.03. Will continue current lasix regimen and encourage PO hydration. Called patient to inform him of results.

## 2018-04-24 NOTE — Assessment & Plan Note (Addendum)
Assessment Persistent non-healed wounds of bilateral lower extremity 2/2 PVD. Pictures available under Media tab. Prescribed 2 weeks of Keflex on 5/23 and wife states that she thinks it is just starting to kick in so she requests longer antibiotic course. Will provide another 2 weeks of Keflex. Patient also doing well on lasix 40mg  daily with extra 20mg  as needed.  Plan - Refilled Keflex 500mg  BID x2 weeks - Refilled lasix 40mg  daily and lasix 20mg  daily as needed

## 2018-04-24 NOTE — Progress Notes (Signed)
   CC: management of chronic osteomyelitis  HPI:  Mr.Jamarrius T Jac CanavanWatlington is a 84 y.o. male with PMH of atrial fibrillation, CKD, PVD, chronic osteomyelitis of lower extremities, and HTN who presents for management of lower extremity wounds.  [name] returns today with his wife. His wife is his primary caregiver and does dressing changes for his lower extremities and helps him take his medications. He was last seen in clinic in 01/2018 and prescribed a 2 week course of Keflex. Since that time, wife reports that she felt his wounds were regressing so she requested another course of antibiotics on 5/23. Keflex was prescribed for 2 weeks on this date - he has a few days left of this antibiotic, but wife states that the antibiotic has just started to work, so she requests a longer course. Patient denies pain of lower extremities.  He was also noted to have an elevated creatinine above baseline of 1.5-1.7 at his visit in 01/2018. At that visit, he was instructed to decrease to 40mg  lasix daily and to take an extra 20mg  only when needed. Wife has been doing this and reports that she gives him the extra 20mg  daily for several days in a row when she notes "water blisters" on his shins, she stops the extra dose when they have resolved. Patient denies dysuria.   Past Medical History:  Diagnosis Date  . Acute blood loss anemia 10/18/2013  . Anemia of chronic disease   . Atrial fibrillation (HCC)   . Chronic kidney disease   . Chronic osteomyelitis (HCC)   . Gout   . History of blood transfusion    "related to anemia" (07/16/2017)  . Hypertension   . PVD (peripheral vascular disease) (HCC)    Review of Systems:   GEN: Negative for fevers CV: Negative for chest pain  EXT: Positive for chronic LE wounds  Physical Exam:  Vitals:   04/24/18 1318  BP: 140/68  Pulse: 94  Temp: 98.4 F (36.9 C)  TempSrc: Oral  SpO2: 100%  Weight: 176 lb 6.4 oz (80 kg)   GEN: Sitting comfortably in wheelchair  in NAD CV: Irregularly irregular rhythm, no m/r/g PULM: CTAB, no wheezes or rales ABD: Soft, NT, ND, +BS EXT: Multiple large ulcerations of bilateral lower extremities with several areas of healing pink-skin. Ulcerations have clean base, non-draining, granulation tissue present. +LE swelling, +odor, but not too malodorous. No warmth.  Assessment & Plan:   See Encounters Tab for problem based charting.  Patient discussed with Dr. Oswaldo DoneVincent

## 2018-04-25 LAB — BMP8+ANION GAP
Anion Gap: 16 mmol/L (ref 10.0–18.0)
BUN/Creatinine Ratio: 14 (ref 10–24)
BUN: 26 mg/dL (ref 10–36)
CO2: 22 mmol/L (ref 20–29)
Calcium: 8.9 mg/dL (ref 8.6–10.2)
Chloride: 106 mmol/L (ref 96–106)
Creatinine, Ser: 1.85 mg/dL — ABNORMAL HIGH (ref 0.76–1.27)
GFR calc Af Amer: 36 mL/min/{1.73_m2} — ABNORMAL LOW (ref >59)
GFR calc non Af Amer: 31 mL/min/{1.73_m2} — ABNORMAL LOW (ref >59)
Glucose: 97 mg/dL (ref 65–99)
Potassium: 4 mmol/L (ref 3.5–5.2)
Sodium: 144 mmol/L (ref 134–144)

## 2018-04-25 NOTE — Progress Notes (Signed)
Internal Medicine Clinic Attending  Case discussed with Dr. Huang at the time of the visit.  We reviewed the resident's history and exam and pertinent patient test results.  I agree with the assessment, diagnosis, and plan of care documented in the resident's note. 

## 2018-05-13 ENCOUNTER — Other Ambulatory Visit: Payer: Self-pay | Admitting: Internal Medicine

## 2018-05-13 NOTE — Telephone Encounter (Signed)
Pt wife requesting refill on Keflex 500mg , doxycycline, send to [facility] [address]. If any questions call pt

## 2018-05-14 ENCOUNTER — Other Ambulatory Visit: Payer: Self-pay | Admitting: Internal Medicine

## 2018-05-14 DIAGNOSIS — M1000 Idiopathic gout, unspecified site: Secondary | ICD-10-CM

## 2018-05-14 NOTE — Telephone Encounter (Signed)
PATIENT'S WIFE CAROL CALLED AGAIN TO SEE IF REFILL REQUESTED YESTERDAY HAD BEEN PROCESSED.

## 2018-05-14 NOTE — Telephone Encounter (Signed)
Will re-forward request to PCP L. Leward Quanucatte, RN, BSN

## 2018-05-15 ENCOUNTER — Other Ambulatory Visit: Payer: Self-pay | Admitting: Internal Medicine

## 2018-05-15 MED ORDER — ALLOPURINOL 100 MG PO TABS: 200.0000 mg | ORAL_TABLET | Freq: Every day | ORAL | 3 refills | Status: AC

## 2018-05-15 MED ORDER — CEPHALEXIN 500 MG PO CAPS: 500.0000 mg | ORAL_CAPSULE | Freq: Two times a day (BID) | ORAL | 0 refills | Status: DC

## 2018-05-15 MED ORDER — DOXYCYCLINE HYCLATE 50 MG PO CAPS
100.0000 mg | ORAL_CAPSULE | Freq: Two times a day (BID) | ORAL | 0 refills | Status: DC
Start: 2018-05-15 — End: 2018-06-19

## 2018-05-15 NOTE — Telephone Encounter (Signed)
Patient returning call for another  Medication Refill  allopurinol (ZYLOPRIM) 100 MG tablet  Pt would like to change pharmacy to  [facility] [address] as well.

## 2018-05-15 NOTE — Telephone Encounter (Signed)
Left message on wife's self-identified VM that antibiotics have [facility]. And to schedule f/u with new PCP or [facility] if PCP unavailable. Will also route to [facility] for scheduling. Kinnie FeilL. Myrlene Riera, RN, BSN

## 2018-05-18 ENCOUNTER — Encounter: Payer: Self-pay | Admitting: *Deleted

## 2018-05-27 ENCOUNTER — Other Ambulatory Visit: Payer: Self-pay | Admitting: Internal Medicine

## 2018-05-29 ENCOUNTER — Other Ambulatory Visit: Payer: Self-pay | Admitting: Internal Medicine

## 2018-05-29 ENCOUNTER — Other Ambulatory Visit: Payer: Self-pay | Admitting: *Deleted

## 2018-05-29 NOTE — Telephone Encounter (Signed)
Requesting 90 day supply. Thanks 

## 2018-05-29 NOTE — Telephone Encounter (Signed)
States the pharmacy is still waiting for the clinic to reply back on furosemide (LASIX) 20 MG tablet. [facility] [address].

## 2018-05-30 ENCOUNTER — Encounter: Payer: Medicare Other | Admitting: Internal Medicine

## 2018-06-14 ENCOUNTER — Telehealth: Payer: Self-pay | Admitting: Internal Medicine

## 2018-06-14 NOTE — Telephone Encounter (Signed)
Pt wife would like to know if patient can cont. Antibotics, pls contact pt [phone] [facility] [address]

## 2018-06-14 NOTE — Telephone Encounter (Signed)
Lm for rtc 

## 2018-06-17 ENCOUNTER — Other Ambulatory Visit: Payer: Self-pay | Admitting: Internal Medicine

## 2018-06-17 NOTE — Telephone Encounter (Signed)
Wife rtc, she states he needs to stay on abx for the wounds on his legs and feet, so she would like a refill has appt coming up in aug

## 2018-06-19 MED ORDER — DOXYCYCLINE HYCLATE 50 MG PO CAPS: 100.0000 mg | ORAL_CAPSULE | Freq: Two times a day (BID) | ORAL | 0 refills | Status: DC

## 2018-06-19 NOTE — Telephone Encounter (Signed)
Called and spoke with patient's wife. I sent in a prescription for Doxycyline and will follow up with patient at his appt with me next week.

## 2018-06-27 ENCOUNTER — Ambulatory Visit (INDEPENDENT_AMBULATORY_CARE_PROVIDER_SITE_OTHER): Payer: Medicare Other | Admitting: Internal Medicine

## 2018-06-27 ENCOUNTER — Encounter: Payer: Self-pay | Admitting: Internal Medicine

## 2018-06-27 ENCOUNTER — Other Ambulatory Visit: Payer: Self-pay

## 2018-06-27 VITALS — BP 133/71 | HR 90 | Temp 98.4°F | Ht 70.0 in | Wt 175.3 lb

## 2018-06-27 DIAGNOSIS — N183 Chronic kidney disease, stage 3 unspecified: Secondary | ICD-10-CM

## 2018-06-27 DIAGNOSIS — R339 Retention of urine, unspecified: Secondary | ICD-10-CM | POA: Diagnosis not present

## 2018-06-27 DIAGNOSIS — M86662 Other chronic osteomyelitis, left tibia and fibula: Secondary | ICD-10-CM

## 2018-06-27 DIAGNOSIS — Z79899 Other long term (current) drug therapy: Secondary | ICD-10-CM

## 2018-06-27 DIAGNOSIS — M86661 Other chronic osteomyelitis, right tibia and fibula: Secondary | ICD-10-CM | POA: Diagnosis not present

## 2018-06-27 DIAGNOSIS — Z87891 Personal history of nicotine dependence: Secondary | ICD-10-CM | POA: Diagnosis not present

## 2018-06-27 DIAGNOSIS — I129 Hypertensive chronic kidney disease with stage 1 through stage 4 chronic kidney disease, or unspecified chronic kidney disease: Secondary | ICD-10-CM | POA: Diagnosis not present

## 2018-06-27 DIAGNOSIS — I10 Essential (primary) hypertension: Secondary | ICD-10-CM

## 2018-06-27 DIAGNOSIS — I739 Peripheral vascular disease, unspecified: Secondary | ICD-10-CM

## 2018-06-27 DIAGNOSIS — D638 Anemia in other chronic diseases classified elsewhere: Secondary | ICD-10-CM

## 2018-06-27 DIAGNOSIS — M8660 Other chronic osteomyelitis, unspecified site: Secondary | ICD-10-CM

## 2018-06-27 NOTE — Progress Notes (Signed)
   CC: leg wounds  HPI:  Mr.Javarus T Jac CanavanWatlington is a 84 y.o. male with PMHx as listed below who presents for management of chronic conditions.  Please see encounter tab for full details of HPI  Past Medical History:  Diagnosis Date  . Acute blood loss anemia 10/18/2013  . Anemia of chronic disease   . Atrial fibrillation (HCC)   . Chronic kidney disease   . Chronic osteomyelitis (HCC)   . Gout   . History of blood transfusion    "related to anemia" (07/16/2017)  . Hypertension   . PVD (peripheral vascular disease) (HCC)    Physical Exam:  Vitals:   06/27/18 1519  BP: 133/71  Pulse: 90  Temp: 98.4 F (36.9 C)  SpO2: 99%  Weight: 175 lb 4.8 oz (79.5 kg)   Gen: Well appearing, NAD CV: RRR, no murmurs Pulm: Normal effort, CTA throughout, no wheezing Ext: Foul odor, multiple large ulcerations of bilateral lower extremities, pink open blister on right shin. Skin thickening over calf and shin. Slight warmth and redness. No swelling.    Assessment & Plan:   See Encounters Tab for problem based charting.  Patient seen with Dr. Criselda PeachesMullen

## 2018-06-27 NOTE — Assessment & Plan Note (Addendum)
Stopped finasteride 6 months ago because the urologist wouldn't refill it without an appointment. Denies dysuria, incontinence

## 2018-06-27 NOTE — Assessment & Plan Note (Signed)
Persistent non-healed wounds of bilateral lower extremities 2/2 PVD. Ongoing for the last 5 years. Last admitted Sept 2018 for IV antibiotics. Has been managing well outpatient with intermittent courses of oral antibiotics and detailed wound care per the wife. She uses silver sulfadiazine cream and changes bandages daily. [name] is able to get around the house using a walker and is happy that he has stayed out of the hospital this long. He denies fevers, chills, diarrhea, dysuria.   Plan: - Complete current 2 week course of doxycycline - f/u 3 months. Wife will call if needed to be seen sooner.

## 2018-06-27 NOTE — Assessment & Plan Note (Signed)
Taking lasix 60mg  daily (40mg +20mg ). Discussed checking renal function today but patient would like to wait until next time.

## 2018-06-27 NOTE — Assessment & Plan Note (Addendum)
Discontinued amlodipine at last visit. Today BP 133/71. Take lasix 60mg  daily. Continue to monitor

## 2018-06-27 NOTE — Patient Instructions (Signed)
It was nice seeing you today. Thank you for choosing [facility] for your Primary Care.   Glad you're doing well! Keep taking the rest of your doxycycline and then let me know if the wounds start to worsen.    FOLLOW-UP INSTRUCTIONS When: 3 months For: leg wounds  Please contact the clinic if you have any problems, or need to be seen sooner.

## 2018-07-01 NOTE — Progress Notes (Signed)
Internal Medicine Clinic Attending  I saw and evaluated the patient.  I personally confirmed the key portions of the history and exam documented by Dr. Vogel and I reviewed pertinent patient test results.  The assessment, diagnosis, and plan were formulated together and I agree with the documentation in the resident's note.  

## 2018-07-01 NOTE — Addendum Note (Signed)
Addended by: Debe CoderMULLEN, EMILY B on: 07/01/2018 07:17 AM   Modules accepted: Level of Service

## 2018-07-11 ENCOUNTER — Other Ambulatory Visit: Payer: Self-pay | Admitting: Internal Medicine

## 2018-07-11 NOTE — Telephone Encounter (Signed)
Needs refill on doxycycline (VIBRAMYCIN) 50 MG capsule @ [facility]; pt contact # [phone]

## 2018-07-12 MED ORDER — DOXYCYCLINE HYCLATE 50 MG PO CAPS: 100.0000 mg | ORAL_CAPSULE | Freq: Two times a day (BID) | ORAL | 0 refills | Status: DC

## 2018-07-12 NOTE — Telephone Encounter (Signed)
Pt's wife called - no answer; left message about Doxycycline refill.

## 2018-07-12 NOTE — Telephone Encounter (Signed)
Requesting to speak with a nurse about getting doxycycline by today. Please call pt back.

## 2018-07-26 ENCOUNTER — Other Ambulatory Visit: Payer: Self-pay | Admitting: Internal Medicine

## 2018-08-12 ENCOUNTER — Other Ambulatory Visit: Payer: Self-pay | Admitting: *Deleted

## 2018-08-12 MED ORDER — SILVER SULFADIAZINE 1 % EX CREA: TOPICAL_CREAM | CUTANEOUS | 0 refills | Status: DC

## 2018-08-24 DIAGNOSIS — R4182 Altered mental status, unspecified: Secondary | ICD-10-CM | POA: Diagnosis not present

## 2018-08-24 DIAGNOSIS — I491 Atrial premature depolarization: Secondary | ICD-10-CM | POA: Diagnosis not present

## 2018-08-24 DIAGNOSIS — I4510 Unspecified right bundle-branch block: Secondary | ICD-10-CM | POA: Diagnosis not present

## 2018-08-24 DIAGNOSIS — E860 Dehydration: Secondary | ICD-10-CM | POA: Diagnosis not present

## 2018-08-24 DIAGNOSIS — R404 Transient alteration of awareness: Secondary | ICD-10-CM | POA: Diagnosis not present

## 2018-08-26 ENCOUNTER — Inpatient Hospital Stay (HOSPITAL_COMMUNITY)
Admission: EM | Admit: 2018-08-26 | Discharge: 2018-08-30 | DRG: 872 | Disposition: A | Discharge status: Home Health | Payer: Medicare Other | Attending: Internal Medicine | Admitting: Internal Medicine

## 2018-08-26 ENCOUNTER — Emergency Department (HOSPITAL_COMMUNITY): Payer: Medicare Other

## 2018-08-26 ENCOUNTER — Encounter (HOSPITAL_COMMUNITY): Payer: Self-pay

## 2018-08-26 DIAGNOSIS — A419 Sepsis, unspecified organism: Secondary | ICD-10-CM | POA: Diagnosis present

## 2018-08-26 DIAGNOSIS — R05 Cough: Secondary | ICD-10-CM | POA: Diagnosis not present

## 2018-08-26 DIAGNOSIS — M8660 Other chronic osteomyelitis, unspecified site: Secondary | ICD-10-CM | POA: Diagnosis present

## 2018-08-26 DIAGNOSIS — E440 Moderate protein-calorie malnutrition: Secondary | ICD-10-CM | POA: Diagnosis present

## 2018-08-26 DIAGNOSIS — I452 Bifascicular block: Secondary | ICD-10-CM | POA: Diagnosis present

## 2018-08-26 DIAGNOSIS — R000 Tachycardia, unspecified: Secondary | ICD-10-CM | POA: Diagnosis not present

## 2018-08-26 DIAGNOSIS — L97518 Non-pressure chronic ulcer of other part of right foot with other specified severity: Secondary | ICD-10-CM | POA: Diagnosis present

## 2018-08-26 DIAGNOSIS — I480 Paroxysmal atrial fibrillation: Secondary | ICD-10-CM | POA: Diagnosis not present

## 2018-08-26 DIAGNOSIS — I491 Atrial premature depolarization: Secondary | ICD-10-CM | POA: Diagnosis present

## 2018-08-26 DIAGNOSIS — N179 Acute kidney failure, unspecified: Secondary | ICD-10-CM | POA: Diagnosis present

## 2018-08-26 DIAGNOSIS — M109 Gout, unspecified: Secondary | ICD-10-CM | POA: Diagnosis present

## 2018-08-26 DIAGNOSIS — I739 Peripheral vascular disease, unspecified: Secondary | ICD-10-CM | POA: Diagnosis present

## 2018-08-26 DIAGNOSIS — D649 Anemia, unspecified: Secondary | ICD-10-CM | POA: Diagnosis not present

## 2018-08-26 DIAGNOSIS — I248 Other forms of acute ischemic heart disease: Secondary | ICD-10-CM | POA: Diagnosis present

## 2018-08-26 DIAGNOSIS — R0602 Shortness of breath: Secondary | ICD-10-CM | POA: Diagnosis not present

## 2018-08-26 DIAGNOSIS — Z87891 Personal history of nicotine dependence: Secondary | ICD-10-CM

## 2018-08-26 DIAGNOSIS — M79672 Pain in left foot: Secondary | ICD-10-CM | POA: Diagnosis present

## 2018-08-26 DIAGNOSIS — L97818 Non-pressure chronic ulcer of other part of right lower leg with other specified severity: Secondary | ICD-10-CM | POA: Diagnosis present

## 2018-08-26 DIAGNOSIS — R600 Localized edema: Secondary | ICD-10-CM | POA: Diagnosis present

## 2018-08-26 DIAGNOSIS — Z7982 Long term (current) use of aspirin: Secondary | ICD-10-CM

## 2018-08-26 DIAGNOSIS — D638 Anemia in other chronic diseases classified elsewhere: Secondary | ICD-10-CM | POA: Diagnosis present

## 2018-08-26 DIAGNOSIS — I129 Hypertensive chronic kidney disease with stage 1 through stage 4 chronic kidney disease, or unspecified chronic kidney disease: Secondary | ICD-10-CM | POA: Diagnosis not present

## 2018-08-26 DIAGNOSIS — N183 Chronic kidney disease, stage 3 unspecified: Secondary | ICD-10-CM | POA: Diagnosis present

## 2018-08-26 DIAGNOSIS — R9431 Abnormal electrocardiogram [ECG] [EKG]: Secondary | ICD-10-CM | POA: Diagnosis present

## 2018-08-26 DIAGNOSIS — I878 Other specified disorders of veins: Secondary | ICD-10-CM | POA: Diagnosis present

## 2018-08-26 DIAGNOSIS — D509 Iron deficiency anemia, unspecified: Secondary | ICD-10-CM | POA: Diagnosis present

## 2018-08-26 DIAGNOSIS — M86679 Other chronic osteomyelitis, unspecified ankle and foot: Secondary | ICD-10-CM | POA: Diagnosis present

## 2018-08-26 DIAGNOSIS — D631 Anemia in chronic kidney disease: Secondary | ICD-10-CM | POA: Diagnosis present

## 2018-08-26 DIAGNOSIS — I5032 Chronic diastolic (congestive) heart failure: Secondary | ICD-10-CM | POA: Diagnosis present

## 2018-08-26 DIAGNOSIS — R531 Weakness: Secondary | ICD-10-CM | POA: Diagnosis not present

## 2018-08-26 DIAGNOSIS — M1000 Idiopathic gout, unspecified site: Secondary | ICD-10-CM

## 2018-08-26 DIAGNOSIS — E860 Dehydration: Secondary | ICD-10-CM | POA: Diagnosis present

## 2018-08-26 DIAGNOSIS — I130 Hypertensive heart and chronic kidney disease with heart failure and stage 1 through stage 4 chronic kidney disease, or unspecified chronic kidney disease: Secondary | ICD-10-CM | POA: Diagnosis present

## 2018-08-26 DIAGNOSIS — E872 Acidosis: Secondary | ICD-10-CM | POA: Diagnosis not present

## 2018-08-26 DIAGNOSIS — R402253 Coma scale, best verbal response, oriented, at hospital admission: Secondary | ICD-10-CM | POA: Diagnosis present

## 2018-08-26 DIAGNOSIS — E870 Hyperosmolality and hypernatremia: Secondary | ICD-10-CM | POA: Diagnosis not present

## 2018-08-26 DIAGNOSIS — L97529 Non-pressure chronic ulcer of other part of left foot with unspecified severity: Secondary | ICD-10-CM | POA: Diagnosis present

## 2018-08-26 DIAGNOSIS — M79671 Pain in right foot: Secondary | ICD-10-CM | POA: Diagnosis present

## 2018-08-26 DIAGNOSIS — I10 Essential (primary) hypertension: Secondary | ICD-10-CM | POA: Diagnosis present

## 2018-08-26 DIAGNOSIS — R7989 Other specified abnormal findings of blood chemistry: Secondary | ICD-10-CM | POA: Diagnosis present

## 2018-08-26 DIAGNOSIS — Z79899 Other long term (current) drug therapy: Secondary | ICD-10-CM

## 2018-08-26 DIAGNOSIS — S81809A Unspecified open wound, unspecified lower leg, initial encounter: Secondary | ICD-10-CM | POA: Diagnosis present

## 2018-08-26 DIAGNOSIS — Z8614 Personal history of Methicillin resistant Staphylococcus aureus infection: Secondary | ICD-10-CM

## 2018-08-26 DIAGNOSIS — R0902 Hypoxemia: Secondary | ICD-10-CM | POA: Diagnosis not present

## 2018-08-26 DIAGNOSIS — R402143 Coma scale, eyes open, spontaneous, at hospital admission: Secondary | ICD-10-CM | POA: Diagnosis present

## 2018-08-26 DIAGNOSIS — M86661 Other chronic osteomyelitis, right tibia and fibula: Secondary | ICD-10-CM | POA: Diagnosis not present

## 2018-08-26 DIAGNOSIS — L03119 Cellulitis of unspecified part of limb: Secondary | ICD-10-CM | POA: Diagnosis not present

## 2018-08-26 DIAGNOSIS — R402363 Coma scale, best motor response, obeys commands, at hospital admission: Secondary | ICD-10-CM | POA: Diagnosis present

## 2018-08-26 DIAGNOSIS — I4510 Unspecified right bundle-branch block: Secondary | ICD-10-CM | POA: Diagnosis not present

## 2018-08-26 DIAGNOSIS — L089 Local infection of the skin and subcutaneous tissue, unspecified: Secondary | ICD-10-CM | POA: Diagnosis not present

## 2018-08-26 DIAGNOSIS — R42 Dizziness and giddiness: Secondary | ICD-10-CM | POA: Diagnosis not present

## 2018-08-26 DIAGNOSIS — M86662 Other chronic osteomyelitis, left tibia and fibula: Secondary | ICD-10-CM | POA: Diagnosis not present

## 2018-08-26 LAB — COMPREHENSIVE METABOLIC PANEL
ALT: 6 U/L (ref 0–44)
AST: 9 U/L — AB (ref 15–41)
Albumin: 1.8 g/dL — ABNORMAL LOW (ref 3.5–5.0)
Alkaline Phosphatase: 75 U/L (ref 38–126)
Anion gap: 13 (ref 5–15)
BILIRUBIN TOTAL: 0.5 mg/dL (ref 0.3–1.2)
BUN: 66 mg/dL — AB (ref 8–23)
CALCIUM: 8.3 mg/dL — AB (ref 8.9–10.3)
CO2: 20 mmol/L — ABNORMAL LOW (ref 22–32)
Chloride: 108 mmol/L (ref 98–111)
Creatinine, Ser: 3.79 mg/dL — ABNORMAL HIGH (ref 0.61–1.24)
GFR, EST AFRICAN AMERICAN: 15 mL/min — AB (ref >60)
GFR, EST NON AFRICAN AMERICAN: 13 mL/min — AB (ref >60)
Glucose, Bld: 96 mg/dL (ref 70–99)
POTASSIUM: 3.4 mmol/L — AB (ref 3.5–5.1)
Sodium: 141 mmol/L (ref 135–145)
Total Protein: 6.8 g/dL (ref 6.5–8.1)

## 2018-08-26 LAB — CBC WITH DIFFERENTIAL/PLATELET
Abs Immature Granulocytes: 0.1 10*3/uL (ref 0.0–0.1)
BASOS PCT: 0 %
Basophils Absolute: 0 10*3/uL (ref 0.0–0.1)
EOS ABS: 0 10*3/uL (ref 0.0–0.7)
EOS PCT: 0 %
HEMATOCRIT: 41.3 % (ref 39.0–52.0)
Hemoglobin: 12.8 g/dL — ABNORMAL LOW (ref 13.0–17.0)
Immature Granulocytes: 1 %
LYMPHS ABS: 0.3 10*3/uL — AB (ref 0.7–4.0)
Lymphocytes Relative: 2 %
MCH: 25.9 pg — AB (ref 26.0–34.0)
MCHC: 31 g/dL (ref 30.0–36.0)
MCV: 83.6 fL (ref 78.0–100.0)
MONO ABS: 0.7 10*3/uL (ref 0.1–1.0)
Monocytes Relative: 5 %
Neutro Abs: 12.7 10*3/uL — ABNORMAL HIGH (ref 1.7–7.7)
Neutrophils Relative %: 92 %
Platelets: 200 10*3/uL (ref 150–400)
RBC: 4.94 MIL/uL (ref 4.22–5.81)
RDW: 17.2 % — AB (ref 11.5–15.5)
WBC: 13.8 10*3/uL — ABNORMAL HIGH (ref 4.0–10.5)

## 2018-08-26 LAB — LACTATE DEHYDROGENASE: LDH: 113 U/L (ref 98–192)

## 2018-08-26 LAB — MAGNESIUM: MAGNESIUM: 1.6 mg/dL — AB (ref 1.7–2.4)

## 2018-08-26 LAB — PREALBUMIN: Prealbumin: 7.2 mg/dL — ABNORMAL LOW (ref 18–38)

## 2018-08-26 LAB — TROPONIN I: TROPONIN I: 0.06 ng/mL — AB (ref <0.03)

## 2018-08-26 MED ORDER — SODIUM CHLORIDE 0.9 % IV BOLUS
500.0000 mL | Freq: Once | INTRAVENOUS | Status: AC
  Administered 2018-08-26: 500 mL via INTRAVENOUS

## 2018-08-26 MED ORDER — SODIUM CHLORIDE 0.9% FLUSH
3.0000 mL | Freq: Two times a day (BID) | INTRAVENOUS | Status: DC
  Administered 2018-08-27 – 2018-08-30 (×5): 3 mL via INTRAVENOUS

## 2018-08-26 MED ORDER — MAGNESIUM SULFATE IN D5W 1-5 GM/100ML-% IV SOLN
1.0000 g | Freq: Once | INTRAVENOUS | Status: AC
  Administered 2018-08-27: 1 g via INTRAVENOUS
  Filled 2018-08-26: qty 100

## 2018-08-26 MED ORDER — ACETAMINOPHEN 650 MG RE SUPP: 650.0000 mg | Freq: Four times a day (QID) | RECTAL | Status: DC | PRN

## 2018-08-26 MED ORDER — DEXTROSE 5 % IV SOLN
500.0000 mg | INTRAVENOUS | Status: DC
  Administered 2018-08-26 – 2018-08-29 (×4): 500 mg via INTRAVENOUS
  Filled 2018-08-26 (×6): qty 0.5

## 2018-08-26 MED ORDER — SODIUM CHLORIDE 0.9 % IV SOLN
INTRAVENOUS | Status: DC
  Administered 2018-08-28 (×2): via INTRAVENOUS

## 2018-08-26 MED ORDER — HYDROCODONE-ACETAMINOPHEN 5-325 MG PO TABS
1.0000 | ORAL_TABLET | ORAL | Status: DC | PRN
  Administered 2018-08-27: 1 via ORAL
  Filled 2018-08-26: qty 1

## 2018-08-26 MED ORDER — ENOXAPARIN SODIUM 30 MG/0.3ML ~~LOC~~ SOLN: 30.0000 mg | SUBCUTANEOUS | Status: DC

## 2018-08-26 MED ORDER — ACETAMINOPHEN 325 MG PO TABS: 650.0000 mg | ORAL_TABLET | Freq: Four times a day (QID) | ORAL | Status: DC | PRN

## 2018-08-26 MED ORDER — VANCOMYCIN HCL IN DEXTROSE 1-5 GM/200ML-% IV SOLN
1000.0000 mg | INTRAVENOUS | Status: DC
  Administered 2018-08-26 – 2018-08-28 (×2): 1000 mg via INTRAVENOUS
  Filled 2018-08-26 (×2): qty 200

## 2018-08-26 MED ORDER — ASPIRIN EC 81 MG PO TBEC
81.0000 mg | DELAYED_RELEASE_TABLET | Freq: Every day | ORAL | Status: DC
  Administered 2018-08-26: 81 mg via ORAL
  Filled 2018-08-26: qty 1

## 2018-08-26 MED ORDER — POLYETHYLENE GLYCOL 3350 17 G PO PACK: 17.0000 g | PACK | Freq: Every day | ORAL | Status: DC | PRN

## 2018-08-26 MED ORDER — POTASSIUM CHLORIDE CRYS ER 20 MEQ PO TBCR
40.0000 meq | EXTENDED_RELEASE_TABLET | Freq: Once | ORAL | Status: AC
  Administered 2018-08-27: 40 meq via ORAL
  Filled 2018-08-26: qty 2

## 2018-08-26 MED ORDER — ALLOPURINOL 100 MG PO TABS
200.0000 mg | ORAL_TABLET | Freq: Every day | ORAL | Status: DC
  Administered 2018-08-26 – 2018-08-30 (×5): 200 mg via ORAL
  Filled 2018-08-26 (×5): qty 2

## 2018-08-26 NOTE — ED Triage Notes (Signed)
Pt arrived via [facility] from home c/o weakness, EMS found +orthostatics.  PRN [facility] [place].  End tital 24.

## 2018-08-26 NOTE — ED Notes (Signed)
Pt given urinal.

## 2018-08-26 NOTE — H&P (Addendum)
Date: 08/26/2018               Patient Name:  Vincent Miranda MRN: [id number]  DOB: 12-Feb-1925 Age / Sex: 84 y.o., male   PCP: Ali Lowe, MD         Medical Service: Internal Medicine Teaching Service         Attending Physician: Dr. Gust Rung, DO    First Contact: Dr. Maryla Morrow Pager: [phone]  Second Contact: Dr. Caron Presume Pager: [phone]       After Hours (After 5p/  First Contact Pager: [phone]  weekends / holidays): Second Contact Pager: [phone]   Chief Complaint: Generalized weakness, worsening bilateral foot pain and hypotension.  History of Present Illness: [name] is a 84 yo M with PMHx significant for chronic osteomyelitis and lower extremity wounds,PVD, CKD, HTN, a-fib.Not on any anticoagulation due to patient's choice presented to ED with complaint of worsening generalized weakness, decreased appetite and worsening bilateral foot pain and purulent drainage from his chronic wound.  According to wife patient was feeling better when he was on doxycycline, approximately 1 week after completion of doxycycline in September she noticed gradually worsening appetite and generalized weakness.  Today he was feeling very weak and she checked his blood pressure and it was in 80s which prompted her to call EMS.  She also noticed worsening of his chronic lower extremity wounds with foul-smelling and purulent discharge for about 2 weeks now.  He denies any fever and chills, stating that whenever he gets worsening of his infection never developed fever and chills.  He was also complaining of worsening of bilateral foot pain for which he was taking Advil without any relief.  He continued to take Lasix because of lower extremity edema. Patient's wife is at his primary caregiver and change his dressing daily.  She was able to keep him out of hospital for about a year this time. Patient denies any nausea, vomiting, chest pain or shortness of breath. Denies any recent change in  his bowel habits. Denies any urinary symptoms.  ED course.  In the ED he was normotensive, tachycardic and found to have leukocytosis, AKI, worsening lower extremity wounds with foul-smelling and purulent discharge.  Meds:  Current Meds  Medication Sig  . allopurinol (ZYLOPRIM) 100 MG tablet Take 2 tablets (200 mg total) by mouth daily.  Marland Kitchen aspirin EC 81 MG tablet Take 81 mg by mouth daily.   . furosemide (LASIX) 20 MG tablet TAKE 1 TABLET BY MOUTH ONCE DAILY AS NEEDED IN  ADDITION  TO  40  MG  TABLET  WHEN  THERE  IS  EXTRA  SWELLING (Patient taking differently: Take 20 mg by mouth daily as needed for fluid or edema. )  . furosemide (LASIX) 40 MG tablet TAKE 1 TABLET BY MOUTH ONCE DAILY (Patient taking differently: Take 40 mg by mouth daily. )  . ibuprofen (ADVIL,MOTRIN) 200 MG tablet Take 200 mg by mouth every 6 (six) hours as needed for moderate pain.   Marland Kitchen Potassium 95 MG TABS Take 95 mg by mouth daily.  . silver sulfADIAZINE (SSD) 1 % cream apply to affected area once daily (Patient taking differently: Apply 1 application topically daily. )  . Zinc 50 MG TABS Take 50 mg by mouth daily.     Allergies: Allergies as of 08/26/2018  . (No Known Allergies)   Past Medical History:  Diagnosis Date  . Acute blood loss anemia 10/18/2013  . Anemia of  chronic disease   . Atrial fibrillation (HCC)   . Chronic kidney disease   . Chronic osteomyelitis (HCC)   . Gout   . History of blood transfusion    "related to anemia" (07/16/2017)  . Hypertension   . PVD (peripheral vascular disease) (HCC)     Family History: No pertinent family history.  Social History: Former smoker, quit 40 years ago, denies alcohol or illicit drug use.  Review of Systems: A complete ROS was negative except as per HPI.   Physical Exam: Blood pressure 134/82, pulse 94, temperature 98.4 F (36.9 C), temperature source Oral, resp. rate 19, height 5\' 11"  (1.803 m), weight 79.8 kg, SpO2 97 %. Vitals:   08/26/18  1730 08/26/18 1800 08/26/18 1815 08/26/18 1830  BP: 134/82 139/72 140/74 (!) 146/74  Pulse:      Resp: 19 19 (!) 23 (!) 21  Temp:      TempSrc:      SpO2:      Weight:      Height:       General: Vital signs reviewed.  Chronically ill-appearing elderly man, in no acute distress and cooperative with exam.  Head: Normocephalic and atraumatic. Eyes: EOMI, conjunctivae normal, no scleral icterus.  Cardiovascular: RRR, S1 normal, S2 normal, no murmurs, gallops, or rubs. Pulmonary/Chest: Clear to auscultation bilaterally, no wheezes, rales, or rhonchi. Abdominal: Soft, non-tender, non-distended, BS +,  Extremities: 1+ lower extremity edema bilaterally with signs of venous stasis, multiple foul-smelling wounds with serosanguineous and purulent drainage at some places and amputations on bilateral feet.            Psychiatric: Normal mood and affect. speech and behavior is normal. Cognition and memory are normal.  EKG: personally reviewed my interpretation is tachycardia with premature atrial contractions, right bundle branch block, new T wave inversions in V1, V2 and V3.  Assessment & Plan by Problem: [name] is a 84 yo M with PMHx significant for chronic osteomyelitis and lower extremity wounds,PVD, CKD, HTN, a-fib.Not on any anticoagulation due to patient's choice presented to ED with complaint of worsening generalized weakness, decreased appetite and worsening bilateral foot pain and purulent drainage from his chronic wound.  Chronic lower extremity wounds.  Patient has an history of chronic osteomyelitis involving the tarsals and malleolus bilaterally.  They have refused amputation in the past and still wants to treat conservatively. Further imaging was not obtained. IR was consulted during previous hospitalization for a bone biopsy and he was not considered for any interventional procedure. Patient has an history of MRSA bacteremia, this is his normal presentation whenever  he has worsening of his infection. He was having neutrophilic predominant leukocytosis and was tachycardic on initial presentation.  He was hypotensive with EMS, improved with IV fluid. He does meet SIRS criteria. -Blood cultures. -Start him on cefepime and vancomycin. -Wound care consult. -Nutritional consult. -Norco PRN for pain.  AKI with CKD.  Patient was found to have creatinine of 3.79 and BUN of 66, baseline is around 1.5-1.7.  Patient appears little dehydrated and was taking Lasix regularly. He was given IV fluid by EMS and 500 cc in ED. -Normal saline at 100 mL/h overnight. -Hold Lasix. -Repeat BMP in the morning.  New EKG changes.  Patient with new T wave inversions in anterior leads. No chest pain.  Checking troponin. -Continue aspirin  PAD.  Patient has a long-standing history of peripheral arterial disease, had ABI done in 2016 and 18 with similar results. Not a candidate for  any intervention. -Continue aspirin.  CODE STATUS.  Full. DVT prophylaxis.  Lovenox Diet.  Heart healthy.  Dispo: Admit patient to Inpatient with expected length of stay greater than 2 midnights.  SignedArnetha Courser, MD 08/26/2018, 6:20 PM  Pager: [phone]

## 2018-08-26 NOTE — Progress Notes (Signed)
Pharmacy Antibiotic Note  Vincent Miranda is a 84 y.o. male admitted on 08/26/2018 with wound infection.  Pharmacy has been consulted for vancomycin and cefepime dosing.  Plan: -Vancomycin 1000 mg q48h -Cefepime 500 mg q24h  Height: 5\' 11"  (180.3 cm) Weight: 176 lb (79.8 kg) IBW/kg (Calculated) : 75.3  Temp (24hrs), Avg:98.4 F (36.9 C), Min:98.4 F (36.9 C), Max:98.4 F (36.9 C)  Recent Labs  Lab 08/26/18 1403  WBC 13.8*  CREATININE 3.79*    Estimated Creatinine Clearance: 13.5 mL/min (A) (by C-G formula based on SCr of 3.79 mg/dL (H)).    No Known Allergies  Antimicrobials this admission: Vancomycin 10/7 >>  Cefepime 10/7 >>   Dose adjustments this admission: N/A  Microbiology results: pending  Thank you for allowing pharmacy to be a part of this patient's care.  Koleen Nimrod 08/26/2018 6:19 PM

## 2018-08-27 DIAGNOSIS — E860 Dehydration: Secondary | ICD-10-CM

## 2018-08-27 DIAGNOSIS — Z8614 Personal history of Methicillin resistant Staphylococcus aureus infection: Secondary | ICD-10-CM

## 2018-08-27 DIAGNOSIS — M86662 Other chronic osteomyelitis, left tibia and fibula: Secondary | ICD-10-CM

## 2018-08-27 DIAGNOSIS — E440 Moderate protein-calorie malnutrition: Secondary | ICD-10-CM | POA: Diagnosis present

## 2018-08-27 DIAGNOSIS — M86661 Other chronic osteomyelitis, right tibia and fibula: Secondary | ICD-10-CM

## 2018-08-27 DIAGNOSIS — I491 Atrial premature depolarization: Secondary | ICD-10-CM

## 2018-08-27 DIAGNOSIS — N179 Acute kidney failure, unspecified: Secondary | ICD-10-CM

## 2018-08-27 DIAGNOSIS — I739 Peripheral vascular disease, unspecified: Secondary | ICD-10-CM

## 2018-08-27 DIAGNOSIS — I248 Other forms of acute ischemic heart disease: Secondary | ICD-10-CM

## 2018-08-27 DIAGNOSIS — I129 Hypertensive chronic kidney disease with stage 1 through stage 4 chronic kidney disease, or unspecified chronic kidney disease: Secondary | ICD-10-CM

## 2018-08-27 DIAGNOSIS — D72828 Other elevated white blood cell count: Secondary | ICD-10-CM

## 2018-08-27 DIAGNOSIS — Z89421 Acquired absence of other right toe(s): Secondary | ICD-10-CM

## 2018-08-27 DIAGNOSIS — Z7982 Long term (current) use of aspirin: Secondary | ICD-10-CM

## 2018-08-27 DIAGNOSIS — I4510 Unspecified right bundle-branch block: Secondary | ICD-10-CM

## 2018-08-27 DIAGNOSIS — D631 Anemia in chronic kidney disease: Secondary | ICD-10-CM

## 2018-08-27 DIAGNOSIS — N183 Chronic kidney disease, stage 3 (moderate): Secondary | ICD-10-CM

## 2018-08-27 DIAGNOSIS — Z87891 Personal history of nicotine dependence: Secondary | ICD-10-CM

## 2018-08-27 DIAGNOSIS — Z89422 Acquired absence of other left toe(s): Secondary | ICD-10-CM

## 2018-08-27 DIAGNOSIS — I480 Paroxysmal atrial fibrillation: Secondary | ICD-10-CM

## 2018-08-27 DIAGNOSIS — D509 Iron deficiency anemia, unspecified: Secondary | ICD-10-CM

## 2018-08-27 LAB — COMPREHENSIVE METABOLIC PANEL
ALT: 7 U/L (ref 0–44)
AST: 9 U/L — ABNORMAL LOW (ref 15–41)
Albumin: 1.5 g/dL — ABNORMAL LOW (ref 3.5–5.0)
Alkaline Phosphatase: 60 U/L (ref 38–126)
Anion gap: 11 (ref 5–15)
BILIRUBIN TOTAL: 0.4 mg/dL (ref 0.3–1.2)
BUN: 57 mg/dL — AB (ref 8–23)
CALCIUM: 8.5 mg/dL — AB (ref 8.9–10.3)
CHLORIDE: 113 mmol/L — AB (ref 98–111)
CO2: 19 mmol/L — ABNORMAL LOW (ref 22–32)
CREATININE: 3.08 mg/dL — AB (ref 0.61–1.24)
GFR, EST AFRICAN AMERICAN: 19 mL/min — AB (ref >60)
GFR, EST NON AFRICAN AMERICAN: 16 mL/min — AB (ref >60)
Glucose, Bld: 110 mg/dL — ABNORMAL HIGH (ref 70–99)
Potassium: 3.6 mmol/L (ref 3.5–5.1)
Sodium: 143 mmol/L (ref 135–145)
TOTAL PROTEIN: 6.3 g/dL — AB (ref 6.5–8.1)

## 2018-08-27 LAB — BASIC METABOLIC PANEL
Anion gap: 10 (ref 5–15)
BUN: 59 mg/dL — AB (ref 8–23)
CHLORIDE: 112 mmol/L — AB (ref 98–111)
CO2: 20 mmol/L — ABNORMAL LOW (ref 22–32)
Calcium: 8.3 mg/dL — ABNORMAL LOW (ref 8.9–10.3)
Creatinine, Ser: 3.19 mg/dL — ABNORMAL HIGH (ref 0.61–1.24)
GFR, EST AFRICAN AMERICAN: 18 mL/min — AB (ref >60)
GFR, EST NON AFRICAN AMERICAN: 16 mL/min — AB (ref >60)
Glucose, Bld: 94 mg/dL (ref 70–99)
POTASSIUM: 3.5 mmol/L (ref 3.5–5.1)
SODIUM: 142 mmol/L (ref 135–145)

## 2018-08-27 LAB — CBC
HEMATOCRIT: 22.4 % — AB (ref 39.0–52.0)
Hemoglobin: 7 g/dL — ABNORMAL LOW (ref 13.0–17.0)
MCH: 25.7 pg — ABNORMAL LOW (ref 26.0–34.0)
MCHC: 31.3 g/dL (ref 30.0–36.0)
MCV: 82.4 fL (ref 80.0–100.0)
Platelets: 271 10*3/uL (ref 150–400)
RBC: 2.72 MIL/uL — ABNORMAL LOW (ref 4.22–5.81)
RDW: 16.5 % — AB (ref 11.5–15.5)
WBC: 20.1 10*3/uL — AB (ref 4.0–10.5)

## 2018-08-27 LAB — TROPONIN I
TROPONIN I: 0.06 ng/mL — AB (ref <0.03)
Troponin I: 0.06 ng/mL (ref <0.03)

## 2018-08-27 LAB — IRON AND TIBC
Iron: 13 ug/dL — ABNORMAL LOW (ref 45–182)
SATURATION RATIOS: 10 % — AB (ref 17.9–39.5)
TIBC: 126 ug/dL — ABNORMAL LOW (ref 250–450)
UIBC: 113 ug/dL

## 2018-08-27 LAB — URINALYSIS, ROUTINE W REFLEX MICROSCOPIC
Bilirubin Urine: NEGATIVE
Glucose, UA: NEGATIVE mg/dL
Hgb urine dipstick: NEGATIVE
Ketones, ur: NEGATIVE mg/dL
Leukocytes, UA: NEGATIVE
NITRITE: NEGATIVE
PH: 5 (ref 5.0–8.0)
Protein, ur: NEGATIVE mg/dL
SPECIFIC GRAVITY, URINE: 1.012 (ref 1.005–1.030)

## 2018-08-27 LAB — PREPARE RBC (CROSSMATCH)

## 2018-08-27 LAB — HEMOGLOBIN AND HEMATOCRIT, BLOOD
HCT: 21.8 % — ABNORMAL LOW (ref 39.0–52.0)
Hemoglobin: 6.8 g/dL — CL (ref 13.0–17.0)

## 2018-08-27 LAB — RETICULOCYTES
RBC.: 2.75 MIL/uL — ABNORMAL LOW (ref 4.22–5.81)
RETIC COUNT ABSOLUTE: 60.5 10*3/uL (ref 19.0–186.0)
Retic Ct Pct: 2.2 % (ref 0.4–3.1)

## 2018-08-27 LAB — SAVE SMEAR

## 2018-08-27 LAB — FERRITIN: FERRITIN: 74 ng/mL (ref 24–336)

## 2018-08-27 LAB — LACTATE DEHYDROGENASE: LDH: 93 U/L — ABNORMAL LOW (ref 98–192)

## 2018-08-27 LAB — OCCULT BLOOD X 1 CARD TO LAB, STOOL: Fecal Occult Bld: NEGATIVE

## 2018-08-27 MED ORDER — JUVEN PO PACK
1.0000 | PACK | Freq: Two times a day (BID) | ORAL | Status: DC
  Administered 2018-08-27 – 2018-08-28 (×2): 1 via ORAL
  Filled 2018-08-27 (×4): qty 1

## 2018-08-27 MED ORDER — SILVER SULFADIAZINE 1 % EX CREA
TOPICAL_CREAM | Freq: Every day | CUTANEOUS | Status: DC
  Administered 2018-08-27 – 2018-08-30 (×4): via TOPICAL
  Filled 2018-08-27: qty 85

## 2018-08-27 MED ORDER — SODIUM CHLORIDE 0.9% IV SOLUTION
Freq: Once | INTRAVENOUS | Status: AC
  Administered 2018-08-27: 10 mL via INTRAVENOUS

## 2018-08-27 MED ORDER — MAGNESIUM SULFATE 2 GM/50ML IV SOLN
2.0000 g | Freq: Once | INTRAVENOUS | Status: AC
  Administered 2018-08-27: 2 g via INTRAVENOUS
  Filled 2018-08-27: qty 50

## 2018-08-27 MED ORDER — SODIUM CHLORIDE 0.9% IV SOLUTION
Freq: Once | INTRAVENOUS | Status: AC
  Administered 2018-08-27: 13:00:00 via INTRAVENOUS

## 2018-08-27 MED ORDER — ADULT MULTIVITAMIN W/MINERALS CH
1.0000 | ORAL_TABLET | Freq: Every day | ORAL | Status: DC
  Administered 2018-08-27 – 2018-08-29 (×3): 1 via ORAL
  Filled 2018-08-27 (×4): qty 1

## 2018-08-27 NOTE — Consult Note (Signed)
WOC Nurse wound consult note Reason for Consult: Bilateral feet with full thickness, nonhealing, chronic wounds. Chronic osteomyelitis, which is beyond the scope of WOC Nursing practice.   Patient is well known to our department from numerous past admissions, most recently in August of this year.  Orthopedics has been consulted in the past and recommends amputation, patient and wife are adamant in their refusal and do not wish to see Orthopedic specialist again. They also refuse referral to an outpatient wound care center, preferring to manage the wounds at home with silver sulfadiazine cream and gauze dressings. Wounds are R>L.  Right foot with no 5th digit and bilateral as well as anterior foot ulcerations.  Left foot with no great toe or 1st digit. Left foot with ulcerations on medial aspect only. Wound type: PAD/PVD, infectious Pressure Injury POA: N/A Measurement:  Left medial foot:  14cm x 5cm x 0.4cm affected area Right lateral foot: 13cm x 4cm x 0.4cm affected area Right anterior foot: 6cm x 4cm x 0.4cm affected area Right medial foot: 10cm x 3cm x 0.4cm affected area Wound bed: All wounds are 75% red, 25% yellow Drainage (amount, consistency, odor) Moderate to large amount of malodorous tan drainage Periwound: With evidence of previous wound healing/pink scarring, no maceration. Dressing procedure/placement/frequency: I will provide Nursing with orders for care of the bilateral feet using silver sulfadiazine cream and with daily changes.  WOC nursing team will not follow, but will remain available to this patient, the nursing and medical teams.  Please re-consult if needed. Thanks, Ladona Mow, MSN, RN, GNP, [facility]  Pager# [phone]

## 2018-08-27 NOTE — Progress Notes (Signed)
   Subjective: Patient was seen. No acute event over night. No pain at foot. Discussed his clinical progress and our plan of care.  Objective:  Vital signs in last 24 hours:  Vitals:   08/28/18 0634 08/28/18 1527  BP: 131/69 140/75  Pulse: 93 96  Resp: 17   Temp:  98.5 F (36.9 C)  SpO2: 94% 96%  Physical exam: General: Pleasant gentleman, lying in the bed with no acute distress.  Does not look ill or toxic today. CV:RRR, no murmur Lungs: Clear to auscultation bilaterally, no wheeze, rale Abdomen: Soft, nontender Extremities:  Lower extremities: Wrapped, the exposed part of wound looks clean with no drainage or redness. No pitting edema.  CBC Latest Ref Rng & Units 08/28/2018 08/28/2018 08/27/2018  WBC 4.0 - 10.5 K/uL 14.2(H) - -  Hemoglobin 13.0 - 17.0 g/dL 1.6(X) 0.9(U) 6.8(LL)  Hematocrit 39.0 - 52.0 % 29.7(L) 28.1(L) 21.8(L)  Platelets 150 - 400 K/uL 264 - -  Mg: 2.2 Cr: 2.82<--3.08 yesterday K:3.4   Assessment/Plan:  Principal Problem:   Non-healing wound of lower extremity Active Problems:   Essential hypertension   CKD (chronic kidney disease), stage III (HCC)   Anemia of chronic disease   PVD (peripheral vascular disease) (HCC)   Paroxysmal atrial fibrillation (HCC)   Chronic osteomyelitis (HCC)   AKI (acute kidney injury) (HCC)   Malnutrition of moderate degree Vincent Miranda is a 84 yo M with PMHx significant for chronic osteomyelitis and lower extremity wounds,PVD, CKD, HTN,a-fib.(Not on any anticoagulation due to patient's choice) presented to ED with complaint of worsening generalized weakness, decreased appetite and worsening bilateral foot pain and purulent drainage from his chronic wound.   Chronic lower extremity wounds: They have refused amputation in the past and still wants to treat conservatively. Also were not interested in biopsy on previous admissions. Presented with Acute on chronic wound infection in setting of chronic osteomyelitis: No pain.  Afebrile. Not toxic or ill.  Wound wrraped. But the exposed part shows improvement. No drainage. WBC today:14.2  Blood cultures--->Negative so far  -C/w IV Cefepime and vancomycin. (started on 10/08, will continue for 2 more days and plan for p.o. antibiotic (doxycycline/Keflex for 6 weeks after discharge) -C/w wound care  -Norco PRN for pain.  AKI with CKD: Creatinine today is 2.8.  Improved comparing to 3.79 on admission. (Baseline 1.5 -1.7) -Continue normal saline 100 cc/h -Hold Lasix. -Monitor BMP.  Normocytic Anemia:  No signs or symptoms of GI bleeding, reticulocyte, LDH, peripheral blood smear did not suggest hemolysis. Hemoglobin 8.6 today. Anemia likely secondary to chronic disease as well as iron deficiency anemia despite normal ferritin (can't be reliable as an acute phase reactant), decreased iron, TIBC can also be setting of infection.  Or initial hemoglobin might be a lab error.? -Monitor CBC -May repeat iron study again after acute disease and consider iron supplement after discharge  New EKG changes.  New T wave inversions in anterior leads yesterday. No chest pain. Troponin-->0.06, 0.06, 0.06. (baseline 0.07). Probably secondary to dehydration and demand ischemia.  -Continue aspirin   PAD.  Patient has a long-standing history of peripheral arterial disease, had ABI done in 2016 and 18 with similar results. Not a candidate for any intervention. -Continue aspirin.  Dispo: Anticipated discharge in approximately 1-2 day(s) depends on respond to AB therapy Vincent Pretty, MD 08/28/2018, 4:02 PM  Pager: [phone]

## 2018-08-27 NOTE — Progress Notes (Signed)
Per tele, pt had 14 run vtach. MD aware. Pt stable.

## 2018-08-27 NOTE — Progress Notes (Signed)
Internal Medicine Attending:   I saw and examined the patient. I reviewed the Dr Shanda Bumps note and I agree with the resident's findings and plan as documented in the resident's note. See my attestation of H&P for any additional details

## 2018-08-27 NOTE — Progress Notes (Addendum)
Subjective: Patient has no complaints when seen this morning.  His foot pain has been improved and he was able to sleep last night. Patient denies any nausea, vomiting or abdominal pain.  He denies any dark-colored stools.  Objective:  Vital signs in last 24 hours: Vitals:   08/26/18 1815 08/26/18 1830 08/26/18 2307 08/27/18 0524  BP: 140/74 (!) 146/74 123/69 120/64  Pulse:   96 (!) 102  Resp: (!) 23 (!) 21 17 17   Temp:   98.3 F (36.8 C) 98.4 F (36.9 C)  TempSrc:   Oral Oral  SpO2:   98% 96%  Weight:      Height:       General.  Chronically ill-appearing, elderly man, lying comfortably in his bed, in no acute distress. Lungs.  Clear bilaterally. CV.  Regular rate and rhythm. Abdomen.  Soft, nontender, nondistended, bowel sounds positive. Extremities.  1+ lower extremity edema bilaterally with multiple full-thickness wounds on his both feet along with partial prior amputations.  Appears to have little less drainage as compared to yesterday.  Assessment/Plan: Mr. Ishman is a 84 yo M with PMHx significant for chronic osteomyelitis and lower extremity wounds,PVD, CKD, HTN,a-fib.Not on any anticoagulation due to patient's choice presented to ED with complaint of worsening generalized weakness, decreased appetite and worsening bilateral foot pain and purulent drainage from his chronic wound.  Chronic osteomyelitis/chronic lower extremity wounds.  Keeping his infection under good control is a struggle over the past few years.  Worsening leukocytosis this morning, but there is some concern for more resistant bacteria.  Patient and his wife still do not want any definitive treatment which includes amputation and would like to continue with long-term antibiotics and wound care by his wife. Wound care saw the patient and was recommending sulfadiazine cream with daily dressing change. Blood culture remain negative over 24-hour. Patient remained afebrile. -Continue vancomycin and  cefepime. -Continue CBC monitoring. -Continue daily dressing change with sulfadiazine cream as suggested by wound care. -Continue Norco for pain. -Might get benefit with long-term suppressive antibiotic therapy following completion of current antibiotic course.  Iron deficiency anemia.  Patient's hemoglobin dropped to 7 this morning, on repeat check it was 6.8.  It was 12.8 yesterday, this line is between 8-10. There is no obvious sign of bleeding and FOBT is negative. We doubt the value of 12.8 from yesterday's CBC. Iron studies consistent with iron deficiency along with anemia of chronic illness. Reticulocyte count is not appropriate. CMP and LDH is not consistent with hemolysis, we will review smear. -Getting 2 units of packed RBCs. -We will get benefit with iron supplement, which can be started on discharge. -Continue monitoring, transfuse as needed.  Demand ischemia.  Patient had worsening of anterolateral T wave inversions with positive troponin which remained stable at 0.06, most likely due to demand ischemia secondary to anemia. Denies any chest pain or shortness of breath. -Continue monitoring. -Patient is to receive 2 units of packed RBCs today.  AKI with CKD.  Creatinine improving with fluids. -Continue IV fluid for 1 more day. -Keep holding Lasix. -Repeat BMP in the morning.  PAD.  Patient has a long-standing history of peripheral arterial disease, had ABI done in 2016 and 18 with similar results. Not a candidate for any intervention. -Aspirin was discontinued after seeing a steep fall and his hemoglobin this morning, there is no sign of active bleeding and FOBT is negative. -Aspirin can be resumed once hemoglobin stabilized.  Dispo: Anticipated discharge in approximately  2-3  day(s).   Arnetha Courser, MD 08/27/2018, 11:51 AM Pager: [phone]

## 2018-08-27 NOTE — Progress Notes (Signed)
Initial Nutrition Assessment  DOCUMENTATION CODES:   Non-severe (moderate) malnutrition in context of chronic illness  INTERVENTION:   - Advance diet to Regular  - 1 packet Juven BID, each packet provides 80 calories, 8 grams of carbohydrate, and 14 grams of amino acids; supplement contains CaHMB, glutamine, and arginine, to promote wound healing  - MVI with minerals daily  NUTRITION DIAGNOSIS:   Moderate Malnutrition related to chronic illness (CKD, chronic lower extremity wounds) as evidenced by moderate muscle depletion, moderate fat depletion, severe muscle depletion.  GOAL:   Patient will meet greater than or equal to 90% of their needs  MONITOR:   PO intake, Supplement acceptance, Skin, Weight trends, Labs  REASON FOR ASSESSMENT:   Consult Wound healing  ASSESSMENT:   84 year old male who presented to the ED on 10/7 with complaints of weakness, bilateral foot pain, and hypotension. PMH significant for chronic osteomyelitis and lower extremity wounds, PVD, CKD, hypertension, and atrial fibrillation.  Noted that Orthopedics have recommended amputation in the past but pt and wife continue to refuse.  Spoke with pt and wife at bedside. The majority of history was obtained from pt's wife as pt was eating some fruit at time of visit (grapefuit that pt's wife brought from home). Pt's wife states, "he eats whatever he wants if I can get him to eat." Pt's wife shares that pt's appetite has been poor and he has been eating less over the past 2-3 months due to pt "being on and off antibiotics." Pt's wife also states that pt was dehydrated in the ED and that pt reported being hungry after receiving his first fluid infusion.  Pt's wife reports that pt has gout and that he does not eat beef or oatmeal.  Per pt's wife, pt eats 3 meals daily and drinks water.  Breakfast: grapefruit, grilled cheese sandwich Lunch: may skip or have a late lunch of soup Dinner: chicken and vegetables  (2-3 bites and then pt reports feeling full)  Per discussion with MD, will advance diet to Regular as Heart Healthy diet limits protein and pt's protein needs are increased given wound.  Pt's wife denies that pt has been losing weight recently but shares that pt "lost a lot of weight several years ago" after an episode where he had difficulty swallowing and couldn't eat much. Pt did not gain the weight back afterwards. Pt's wife shares that this resolved spontaneously and that pt has not had any issues with chewing or swallowing since that time.  Pt's wife reports that pt's UBW is 168-170 lbs. Per weight history in chart, pt's weight has remained stable between 173-182 lbs over the past 1 year.  Pt's wife states that Ensure and other oral nutrition supplements "run right through him" but is willing to try Juven to promote wound healing. RD to order Juven along with MVI with minerals. Discussed plan with RN.  Medications reviewed and include: IV Vancocin  Labs reviewed: CO2 20 (L), BUN 59 (H), creatinine 3.19 (H), magnesium 1.6 (L), hemoglobin 7.0 (L), HCT 22.4 (L)  NUTRITION - FOCUSED PHYSICAL EXAM:    Most Recent Value  Orbital Region  Moderate depletion  Upper Arm Region  Moderate depletion  Thoracic and Lumbar Region  Moderate depletion  Buccal Region  Moderate depletion  Temple Region  Severe depletion  Clavicle Bone Region  Moderate depletion  Clavicle and Acromion Bone Region  Severe depletion  Scapular Bone Region  Moderate depletion  Dorsal Hand  Severe depletion  Patellar Region  Moderate depletion  Anterior Thigh Region  Moderate depletion  Posterior Calf Region  Severe depletion  Edema (RD Assessment)  None  Hair  Reviewed  Eyes  Reviewed  Mouth  Reviewed  Skin  Reviewed  Nails  Reviewed       Diet Order:   Diet Order            Diet regular Room service appropriate? Yes; Fluid consistency: Thin  Diet effective now              EDUCATION NEEDS:   No  education needs have been identified at this time  Skin:  Skin Assessment: Skin Integrity Issues: Diabetic Ulcer: R foot  Last BM:  unknown/PTA  Height:   Ht Readings from Last 1 Encounters:  08/26/18 5\' 11"  (1.803 m)    Weight:   Wt Readings from Last 1 Encounters:  08/26/18 79.8 kg    Ideal Body Weight:  78.18 kg  BMI:  Body mass index is 24.55 kg/m.  Estimated Nutritional Needs:   Kcal:  1800-2000  Protein:  100-115 grams  Fluid:  1.8-2.0 L    Earma Reading, MS, RD, LDN Inpatient Clinical Dietitian Pager: [phone] Weekend/After Hours: [phone]

## 2018-08-28 DIAGNOSIS — D649 Anemia, unspecified: Secondary | ICD-10-CM

## 2018-08-28 DIAGNOSIS — L089 Local infection of the skin and subcutaneous tissue, unspecified: Secondary | ICD-10-CM

## 2018-08-28 DIAGNOSIS — R9431 Abnormal electrocardiogram [ECG] [EKG]: Secondary | ICD-10-CM

## 2018-08-28 LAB — TYPE AND SCREEN
ABO/RH(D): O POS
ANTIBODY SCREEN: NEGATIVE
UNIT DIVISION: 0
UNIT DIVISION: 0

## 2018-08-28 LAB — CBC
HEMATOCRIT: 29.7 % — AB (ref 39.0–52.0)
Hemoglobin: 9.3 g/dL — ABNORMAL LOW (ref 13.0–17.0)
MCH: 26.5 pg (ref 26.0–34.0)
MCHC: 31.3 g/dL (ref 30.0–36.0)
MCV: 84.6 fL (ref 80.0–100.0)
Platelets: 264 10*3/uL (ref 150–400)
RBC: 3.51 MIL/uL — ABNORMAL LOW (ref 4.22–5.81)
RDW: 16.5 % — ABNORMAL HIGH (ref 11.5–15.5)
WBC: 14.2 10*3/uL — ABNORMAL HIGH (ref 4.0–10.5)
nRBC: 0 % (ref 0.0–0.2)

## 2018-08-28 LAB — BASIC METABOLIC PANEL
Anion gap: 8 (ref 5–15)
Anion gap: 9 (ref 5–15)
BUN: 53 mg/dL — AB (ref 8–23)
BUN: 56 mg/dL — AB (ref 8–23)
CALCIUM: 8.5 mg/dL — AB (ref 8.9–10.3)
CHLORIDE: 115 mmol/L — AB (ref 98–111)
CO2: 19 mmol/L — AB (ref 22–32)
CO2: 19 mmol/L — AB (ref 22–32)
CREATININE: 2.82 mg/dL — AB (ref 0.61–1.24)
CREATININE: 2.86 mg/dL — AB (ref 0.61–1.24)
Calcium: 8.6 mg/dL — ABNORMAL LOW (ref 8.9–10.3)
Chloride: 111 mmol/L (ref 98–111)
GFR calc Af Amer: 21 mL/min — ABNORMAL LOW (ref >60)
GFR calc Af Amer: 21 mL/min — ABNORMAL LOW (ref >60)
GFR calc non Af Amer: 18 mL/min — ABNORMAL LOW (ref >60)
GFR calc non Af Amer: 18 mL/min — ABNORMAL LOW (ref >60)
GLUCOSE: 103 mg/dL — AB (ref 70–99)
Glucose, Bld: 80 mg/dL (ref 70–99)
Potassium: 3.4 mmol/L — ABNORMAL LOW (ref 3.5–5.1)
Potassium: 3.5 mmol/L (ref 3.5–5.1)
Sodium: 139 mmol/L (ref 135–145)
Sodium: 142 mmol/L (ref 135–145)

## 2018-08-28 LAB — BPAM RBC
Blood Product Expiration Date: 201910142359
Blood Product Expiration Date: 201911052359
ISSUE DATE / TIME: 201910081302
ISSUE DATE / TIME: 201910081758
UNIT TYPE AND RH: 5100
Unit Type and Rh: 9500

## 2018-08-28 LAB — HEMOGLOBIN AND HEMATOCRIT, BLOOD
HCT: 28.1 % — ABNORMAL LOW (ref 39.0–52.0)
Hemoglobin: 8.6 g/dL — ABNORMAL LOW (ref 13.0–17.0)

## 2018-08-28 LAB — MAGNESIUM: Magnesium: 2.2 mg/dL (ref 1.7–2.4)

## 2018-08-28 MED ORDER — POTASSIUM CHLORIDE CRYS ER 20 MEQ PO TBCR
40.0000 meq | EXTENDED_RELEASE_TABLET | Freq: Once | ORAL | Status: AC
  Administered 2018-08-28: 40 meq via ORAL
  Filled 2018-08-28: qty 2

## 2018-08-28 MED ORDER — FUROSEMIDE 10 MG/ML IJ SOLN
40.0000 mg | Freq: Once | INTRAMUSCULAR | Status: AC
  Administered 2018-08-28: 40 mg via INTRAVENOUS
  Filled 2018-08-28: qty 4

## 2018-08-28 NOTE — Progress Notes (Signed)
SLP Cancellation Note  Patient Details Name: Vincent Miranda MRN: [id number] DOB: 1925-01-21   Cancelled treatment:       Reason Eval/Treat Not Completed: Patient declined, no reason specified(Just finished eating and much encouragement for additional po's was ineffective. Will continue efforts.    Royce Macadamia 08/28/2018, 2:54 PM   Breck Coons Lonell Face.Ed Nurse, children's [phone] Office [phone]

## 2018-08-28 NOTE — Care Management Note (Signed)
Case Management Note  Patient Details  Name: Vincent Miranda MRN: [id number] Date of Birth: 06/20/1925  Subjective/Objective:  Non healing wounds of LE. Hx of chronic osteomyelitis and lower extremity wounds,PVD, CKD, HTN,a-fib. From home with wife.            Chauncy Passy (Spouse) [name] [phone])      [phone]         PCP: Maryelizabeth Kaufmann  Action/Plan: HH vs SNF placement ... PT evaluation pending ... NCM following for disposition needs. CSW aware of potential need for SNF placement.  Expected Discharge Date:                  Expected Discharge Plan:     In-House Referral:  Clinical Social Work  Discharge planning Services  CM Consult  Post Acute Care Choice:    Choice offered to:  Patient  DME Arranged:    DME Agency:     HH Arranged:    HH Agency:     Status of Service:  In process, will continue to follow  If discussed at Long Length of Stay Meetings, dates discussed:    Additional Comments:  Epifanio Lesches, RN 08/28/2018, 1:02 PM

## 2018-08-28 NOTE — Progress Notes (Signed)
IMTS Cross Cover Progress Note  Alerted by RN regarding shortness of breath. Patient evaluated at bedside. Per wife, he started getting short of breath a short while ago during re-positioning, however it has persisted which is unusual for him. Patient denies chest pain, cough.  Physical Exam: Constitutional: NAD CV: RRR with occasional skipped beats Resp: mild increased work of breathing, bilateral rales to mid lungs Ext: foul smelling, mostly covered by bandaging; no appreciable pitting edema  Tele reviewed - sinus tachy (100-110) with intermittent PVCs and PAC.  Assessment & Plan: Patient admitted with LE wounds 2/2 osteomyelitis with concomitant AKI 2/2 dehydration. Hospital course has been complicated by worsening anemia requiring 2x blood transfusions. He has been on continuous fluids and his home lasix has been held due to the AKI. He has a history of diastolic HF.  Patient currently complains of shortness of breath with lung exam consistent with fluid overload. O2 sat is 100%.  We'll stop maintenance fluids, give 1x dose of lasix 40mg ; added order for daily weights and strict ins/outs. K was 3.4 earlier today so will give Kdur tonight in anticipation of renal K loses with lasix.  Nyra Market, MD IMTS - PGY 3 Pager [phone]

## 2018-08-28 NOTE — Progress Notes (Signed)
Internal Medicine Attending:   I saw and examined the patient. I reviewed the resident's note and I agree with the resident's findings and plan as documented in the resident's note.   As discussed, afebrile and leukocytosis trending down, BCx NGTD, LE wounds appear less purulent.  Will plan at least 24 hours more IV Abx, if BCx remain negative will discuss options with patient and wife, potentially repeat oral antibiotic course similar to last hospitalization.

## 2018-08-28 NOTE — Consult Note (Signed)
[facility] CM Primary Care Navigator  08/28/2018  Vincent Miranda 06-29-1925 [id number]   Met with patient and wife (Carol)at the bedsideto identify possible discharge needs.  Wife reports that patientwas seen for worsening weakness, decreased appetite, worsening pain to both feet and purulent drainage from his chronic wounds that had led to this admission. (Chronic osteomyelitis/ chronic lower extremity wounds, iron deficiency anemia, acute kidney injury, peripheral arterial disease)  Patient's wifeendorsesDr. Isabelle Course with [place] primary care provider.   Wife states using [facility] [address] obtainmedications without any difficulty.  Patient's wife verbalizedthat she ismanaging his medications at homestraight out of the containers with no problem.  Wife has been driving and providingtransportationto hisdoctors' appointments.  Patient's wifestatesthat she is the primary caregiver for patient at home. Son Shanon Brow) also comes in and checks on patient's needs daily when she is at work.  According to patient's wife, anticipated discharge plan is home when ready.  Patient/ wifevoiced understanding to call primary care provider's office whenhegets home for a post discharge follow-up appointment within1- 2 weeksor sooner if needs arise.Patient letter (with PCP's contact number) was provided astheirreminder.  Explained topatient and wiferegardingTHN CM services available for health management andresourcesat home,butdenied current needs or concerns at thispoint. Wife statesthat she is capable, knowledgeable and has been managingpatient'shealth needs so far and does not feel the need foranyservicesfornow. Patient's wife hadpolitely declined [facility] care management services offered which includes EMMIcalls to follow-up with patient's recovery at home. Patient's wife was  encouragedto seekreferral to [facility] care management from primary care providerifdeemed necessary and appropriate foranyservicesin thefuture.   [facility] care management information provided for future needs thatpatientmay have.  Primary care provider's office is listed as providing transition of care (TOC) follow-up.   For additional questions please contact:  Edwena Felty A. Tavita Eastham, BSN, RN-BC [facility] PRIMARY CARE Navigator Cell: [phone]

## 2018-08-29 ENCOUNTER — Other Ambulatory Visit: Payer: Self-pay

## 2018-08-29 LAB — BASIC METABOLIC PANEL
Anion gap: 9 (ref 5–15)
BUN: 49 mg/dL — AB (ref 8–23)
CO2: 19 mmol/L — ABNORMAL LOW (ref 22–32)
CREATININE: 2.48 mg/dL — AB (ref 0.61–1.24)
Calcium: 9.1 mg/dL (ref 8.9–10.3)
Chloride: 117 mmol/L — ABNORMAL HIGH (ref 98–111)
GFR calc Af Amer: 25 mL/min — ABNORMAL LOW (ref >60)
GFR, EST NON AFRICAN AMERICAN: 21 mL/min — AB (ref >60)
Glucose, Bld: 87 mg/dL (ref 70–99)
Potassium: 3.8 mmol/L (ref 3.5–5.1)
SODIUM: 145 mmol/L (ref 135–145)

## 2018-08-29 LAB — CBC
HCT: 32.6 % — ABNORMAL LOW (ref 39.0–52.0)
Hemoglobin: 9.7 g/dL — ABNORMAL LOW (ref 13.0–17.0)
MCH: 25.9 pg — AB (ref 26.0–34.0)
MCHC: 29.8 g/dL — AB (ref 30.0–36.0)
MCV: 86.9 fL (ref 80.0–100.0)
PLATELETS: 281 10*3/uL (ref 150–400)
RBC: 3.75 MIL/uL — ABNORMAL LOW (ref 4.22–5.81)
RDW: 16.6 % — ABNORMAL HIGH (ref 11.5–15.5)
WBC: 13.1 10*3/uL — ABNORMAL HIGH (ref 4.0–10.5)
nRBC: 0 % (ref 0.0–0.2)

## 2018-08-29 LAB — MAGNESIUM: MAGNESIUM: 2 mg/dL (ref 1.7–2.4)

## 2018-08-29 MED ORDER — VANCOMYCIN HCL IN DEXTROSE 1-5 GM/200ML-% IV SOLN
1000.0000 mg | INTRAVENOUS | Status: DC
  Administered 2018-08-29: 1000 mg via INTRAVENOUS
  Filled 2018-08-29 (×2): qty 200

## 2018-08-29 NOTE — Care Management Important Message (Signed)
Important Message  Patient Details  Name: EDMAR BLANKENBURG MRN: [id number] Date of Birth: 1925/06/16   Medicare Important Message Given:  Yes    Markie Frith Stefan Church 08/29/2018, 4:39 PM

## 2018-08-29 NOTE — Progress Notes (Signed)
   Subjective: [name] is doing good today.  Has had shortness of breath during the night.  He says he feels better after receiving Lasix.  He denies any chest pain.  Denies any pain on his feet.  Not have any other complaints.  Objective:  Vital signs in last 24 hours: Vitals:   08/28/18 0634 08/28/18 1527 08/28/18 2200 08/29/18 0538  BP: 131/69 140/75 (!) 147/76 (!) 141/78  Pulse: 93 96 (!) 106 (!) 101  Resp: 17  20 18   Temp:  98.5 F (36.9 C) 98 F (36.7 C) 98.2 F (36.8 C)  TempSrc:  Oral Oral Oral  SpO2: 94% 96% 96% 95%  Weight:      Height:      Physical Exam  Constitutional: He appears well-developed. No distress.  HENT:  Head: Atraumatic.  Cardiovascular: Normal rate and regular rhythm.  Pulmonary/Chest: Effort normal and breath sounds normal. No respiratory distress. He has no wheezes. He has no rales.  Abdominal: Soft. There is no tenderness.  Neurological: He is alert and demented x3 Extremities: Lower extremities are wrapped. Bandage is clean.    Assessment/Plan:  Principal Problem:   Non-healing wound of lower extremity Active Problems:   Essential hypertension   CKD (chronic kidney disease), stage III (HCC)   Anemia of chronic disease   PVD (peripheral vascular disease) (HCC)   Paroxysmal atrial fibrillation (HCC)   Chronic osteomyelitis (HCC)   AKI (acute kidney injury) (HCC)   Malnutrition of moderate degree Chronic lower extremity wounds:   Presented with Acute on chronic wound infection in setting of chronic osteomyelitis and with Hx of peripheral vascular disease.  On Vanc and Cefepim (day 4). Has responded well. No pain. Afebrile. Not toxic or ill.  Wound wrraped. Clean bandage. WBC today:13.1  Blood culture has been negative x3 days -C/w Vanc and Cefipim  -C/w wound care  -Norco PRN for pain. -May Dc tomorrow with Doxyxycline and Keflex for 6 weeks   AKI with CKD: Creatinine today is 2.4 today. Gradually improving. (Baseline 1.5  -1.7).   -Keep holding normal saline (due to SOB last night) -Hold Lasix. -Monitor BMP  Normocytic Anemia: Hb stable at 9s. (9.7 today.) Anemia of Chronic disease+/- IDA  -Monitor CBC -May repeat iron study again after acute disease and consider iron supplement after discharge  Dispo: Anticipated discharge in approximately 1 day  Chevis Pretty, MD 08/29/2018, 6:43 AM Pager: [phone]

## 2018-08-29 NOTE — Evaluation (Signed)
Physical Therapy Evaluation Patient Details Name: Vincent Miranda MRN: [id number] DOB: 10/27/1925 Today's Date: 08/29/2018   History of Present Illness  Pt is a 84 y/o male admitted secondary to generalized weakness and worsening foot pain (bilaterally). Pt with chronic LE wounds, refusal of amputation previously. PMH including but not limited to chronic osteomyelitis and lower extremity wounds,PVD, CKD, HTN, a-fib.    Clinical Impression  Pt presented supine in bed with HOB elevated, awake and initially willing to participate in therapy session. However, despite max encouragement from therapist and RN, pt refusing to sit EOB or participate in OOB activity. Pt currently requires total A x2 for bed mobility (repositioning in bed). No family/caregivers present. Pt stated that prior to admission he was ambulating with rollator and required assistance with ADLs from his wife. Pt would continue to benefit from skilled physical therapy services at this time while admitted and after d/c to address the below listed limitations in order to improve overall safety and independence with functional mobility.     Follow Up Recommendations SNF;Supervision/Assistance - 24 hour    Equipment Recommendations  None recommended by PT    Recommendations for Other Services       Precautions / Restrictions Precautions Precautions: Fall Restrictions Weight Bearing Restrictions: No      Mobility  Bed Mobility Overal bed mobility: Needs Assistance             General bed mobility comments: pt refusing OOB mobility; required total A x2 for repositioning in bed as pt had slid down towards bottom of the bed; despite max encouragement, pt adamantly refusing to sit EOB   Transfers                    Ambulation/Gait                Stairs            Wheelchair Mobility    Modified Rankin (Stroke Patients Only)       Balance                                             Pertinent Vitals/Pain Pain Assessment: Faces Faces Pain Scale: Hurts even more Pain Location: feet Pain Descriptors / Indicators: Sore Pain Intervention(s): Monitored during session;Repositioned    Home Living Family/patient expects to be discharged to:: Private residence Living Arrangements: Spouse/significant other Available Help at Discharge: Family;Available PRN/intermittently Type of Home: House Home Access: Stairs to enter Entrance Stairs-Rails: None Entrance Stairs-Number of Steps: 1 Home Layout: One level Home Equipment: Environmental consultant - 4 wheels      Prior Function Level of Independence: Needs assistance   Gait / Transfers Assistance Needed: ambulates with rollator  ADL's / Homemaking Assistance Needed: requires some assistance with bathing/dressing from wife        Hand Dominance        Extremity/Trunk Assessment   Upper Extremity Assessment Upper Extremity Assessment: Generalized weakness    Lower Extremity Assessment Lower Extremity Assessment: Generalized weakness    Cervical / Trunk Assessment Cervical / Trunk Assessment: Kyphotic  Communication   Communication: HOH  Cognition Arousal/Alertness: Awake/alert Behavior During Therapy: Flat affect;Agitated Overall Cognitive Status: Impaired/Different from baseline Area of Impairment: Safety/judgement;Problem solving  Safety/Judgement: Decreased awareness of deficits;Decreased awareness of safety   Problem Solving: Slow processing;Decreased initiation        General Comments      Exercises     Assessment/Plan    PT Assessment Patient needs continued PT services  PT Problem List Decreased strength;Decreased activity tolerance;Decreased balance;Decreased mobility;Decreased coordination;Decreased safety awareness;Decreased knowledge of precautions;Pain       PT Treatment Interventions DME instruction;Gait training;Stair training;Functional mobility  training;Therapeutic exercise;Therapeutic activities;Balance training;Neuromuscular re-education;Cognitive remediation;Patient/family education    PT Goals (Current goals can be found in the Care Plan section)  Acute Rehab PT Goals Patient Stated Goal: to eat breakfast PT Goal Formulation: With patient Time For Goal Achievement: 09/12/18 Potential to Achieve Goals: Fair    Frequency Min 2X/week   Barriers to discharge        Co-evaluation               AM-PAC PT "6 Clicks" Daily Activity  Outcome Measure Difficulty turning over in bed (including adjusting bedclothes, sheets and blankets)?: Unable Difficulty moving from lying on back to sitting on the side of the bed? : Unable Difficulty sitting down on and standing up from a chair with arms (e.g., wheelchair, bedside commode, etc,.)?: Unable Help needed moving to and from a bed to chair (including a wheelchair)?: Total Help needed walking in hospital room?: Total Help needed climbing 3-5 steps with a railing? : Total 6 Click Score: 6    End of Session   Activity Tolerance: Patient limited by pain;Patient limited by fatigue Patient left: in bed;with call bell/phone within reach;Other (comment)(RN in room) Nurse Communication: Mobility status PT Visit Diagnosis: Other abnormalities of gait and mobility (R26.89);Muscle weakness (generalized) (M62.81)    Time: 1610-9604 PT Time Calculation (min) (ACUTE ONLY): 23 min   Charges:   PT Evaluation $PT Eval Moderate Complexity: 1 Mod PT Treatments $Therapeutic Activity: 8-22 mins        Deborah Chalk, PT, DPT  Acute Rehabilitation Services Pager [phone] Office [phone]    Alessandra Bevels Estanislado Surgeon 08/29/2018, 11:47 AM

## 2018-08-29 NOTE — Progress Notes (Signed)
Pharmacy Antibiotic Note  Vincent Miranda is a 84 y.o. male admitted on 08/26/2018 with wound infection. He has a history of chronic non-healing lower extremity wounds but has elected not to undergo amputation for source control or follow up with a would care clinic. He has had several hospitalizations in the past to receive IV antibiotics and has been managed with repeated courses of po antibiotics over the past year. Pharmacy has been consulted for vancomycin and cefepime dosing.  Patient presented with an acute on chronic kidney injury that has slowly improved. Scr 3. 79>2.48 today. CrCl ~20.7, afebrile, WBC 13.1.   Plan: Increase to vancomycin 1000 mg IV Q24H Continue cefepime 500 mg IV Q24H Monitor renal function, clinical status, and cultures   Height: 5\' 11"  (180.3 cm) Weight: 176 lb (79.8 kg) IBW/kg (Calculated) : 75.3  Temp (24hrs), Avg:98.2 F (36.8 C), Min:98 F (36.7 C), Max:98.5 F (36.9 C)  Recent Labs  Lab 08/26/18 1403 08/27/18 0433 08/27/18 1113 08/28/18 0030 08/28/18 0720 08/29/18 0318  WBC 13.8* 20.1*  --   --  14.2* 13.1*  CREATININE 3.79* 3.19* 3.08* 2.86* 2.82* 2.48*    Estimated Creatinine Clearance: 20.7 mL/min (A) (by C-G formula based on SCr of 2.48 mg/dL (H)).    No Known Allergies  Antimicrobials this admission: Vancomycin 10/7 >>  Cefepime 10/7 >>   Dose adjustments this admission: 10/10 Vanc 1000mg  Q48H >> Q24H  Microbiology results: 10/7 Bcx: ngtd  Thank you for allowing pharmacy to be a part of this patient's care.  Ewing Schlein, PharmD PGY1 Pharmacy Resident 08/29/2018    11:25 AM

## 2018-08-29 NOTE — Progress Notes (Signed)
SLP Cancellation Note  Patient Details Name: Vincent Miranda MRN: [id number] DOB: [birth year]   Cancelled treatment:       Reason Eval/Treat Not Completed: Patient declined, no reason specified. Pt continues to politely refuse to participate in BSE. He reports no difficulty swallowing, but indicates difficulty breathing. No obvious signs of respiratory distress noted. RN notified of pt concern. Will continue efforts.   Roseanna Koplin B. [facility], CCC-SLP Speech Language Pathologist [phone]  Leigh Aurora 08/29/2018, 9:29 AM

## 2018-08-29 NOTE — Progress Notes (Signed)
Internal Medicine Attending:   I saw and examined the patient. I reviewed the resident's note and I agree with the resident's findings and plan as documented in the resident's note.  Some SOB overngiht, responded well to 1 dose of IV lasix, SCr continue to slowly improve, BCx NGTD.  Overall appears to be improveing would like to see a little more improvement in renal function prior to discharge.   Anticipate discharge in 1 day, seems like patient and family preference is home, however PT recommending SNF.

## 2018-08-30 ENCOUNTER — Inpatient Hospital Stay (HOSPITAL_COMMUNITY): Payer: Medicare Other

## 2018-08-30 DIAGNOSIS — E872 Acidosis: Secondary | ICD-10-CM

## 2018-08-30 DIAGNOSIS — Z9889 Other specified postprocedural states: Secondary | ICD-10-CM

## 2018-08-30 DIAGNOSIS — L03119 Cellulitis of unspecified part of limb: Secondary | ICD-10-CM

## 2018-08-30 DIAGNOSIS — A419 Sepsis, unspecified organism: Principal | ICD-10-CM

## 2018-08-30 DIAGNOSIS — R05 Cough: Secondary | ICD-10-CM

## 2018-08-30 DIAGNOSIS — R0602 Shortness of breath: Secondary | ICD-10-CM

## 2018-08-30 DIAGNOSIS — E870 Hyperosmolality and hypernatremia: Secondary | ICD-10-CM

## 2018-08-30 LAB — CBC
HCT: 34.5 % — ABNORMAL LOW (ref 39.0–52.0)
Hemoglobin: 10.4 g/dL — ABNORMAL LOW (ref 13.0–17.0)
MCH: 26.5 pg (ref 26.0–34.0)
MCHC: 30.1 g/dL (ref 30.0–36.0)
MCV: 88 fL (ref 80.0–100.0)
PLATELETS: 266 10*3/uL (ref 150–400)
RBC: 3.92 MIL/uL — ABNORMAL LOW (ref 4.22–5.81)
RDW: 17.5 % — AB (ref 11.5–15.5)
WBC: 10.8 10*3/uL — AB (ref 4.0–10.5)
nRBC: 0 % (ref 0.0–0.2)

## 2018-08-30 LAB — BASIC METABOLIC PANEL
Anion gap: 14 (ref 5–15)
BUN: 50 mg/dL — AB (ref 8–23)
CALCIUM: 9.3 mg/dL (ref 8.9–10.3)
CO2: 17 mmol/L — ABNORMAL LOW (ref 22–32)
Chloride: 116 mmol/L — ABNORMAL HIGH (ref 98–111)
Creatinine, Ser: 2.51 mg/dL — ABNORMAL HIGH (ref 0.61–1.24)
GFR calc Af Amer: 24 mL/min — ABNORMAL LOW (ref >60)
GFR, EST NON AFRICAN AMERICAN: 21 mL/min — AB (ref >60)
GLUCOSE: 90 mg/dL (ref 70–99)
Potassium: 4.3 mmol/L (ref 3.5–5.1)
SODIUM: 147 mmol/L — AB (ref 135–145)

## 2018-08-30 MED ORDER — POTASSIUM 95 MG PO TABS: 95.0000 mg | ORAL_TABLET | Freq: Every day | ORAL | 0 refills | Status: AC

## 2018-08-30 MED ORDER — SODIUM BICARBONATE 650 MG PO TABS
325.0000 mg | ORAL_TABLET | Freq: Once | ORAL | Status: AC
  Administered 2018-08-30: 325 mg via ORAL
  Filled 2018-08-30: qty 1

## 2018-08-30 MED ORDER — FUROSEMIDE 20 MG PO TABS: ORAL_TABLET | ORAL | 3 refills | Status: AC

## 2018-08-30 MED ORDER — CEPHALEXIN 500 MG PO CAPS: 500.0000 mg | ORAL_CAPSULE | Freq: Two times a day (BID) | ORAL | 0 refills | Status: AC

## 2018-08-30 MED ORDER — FUROSEMIDE 10 MG/ML IJ SOLN
40.0000 mg | Freq: Once | INTRAMUSCULAR | Status: AC
  Administered 2018-08-30: 40 mg via INTRAVENOUS
  Filled 2018-08-30: qty 4

## 2018-08-30 MED ORDER — ADULT MULTIVITAMIN W/MINERALS CH: 1.0000 | ORAL_TABLET | Freq: Every day | ORAL | 0 refills | Status: AC

## 2018-08-30 MED ORDER — DOXYCYCLINE HYCLATE 100 MG PO TABS: 100.0000 mg | ORAL_TABLET | Freq: Two times a day (BID) | ORAL | 0 refills | Status: AC

## 2018-08-30 MED ORDER — GUAIFENESIN ER 600 MG PO TB12: 600.0000 mg | ORAL_TABLET | Freq: Two times a day (BID) | ORAL | Status: DC

## 2018-08-30 MED ORDER — ENOXAPARIN SODIUM 30 MG/0.3ML ~~LOC~~ SOLN: 30.0000 mg | SUBCUTANEOUS | Status: DC

## 2018-08-30 MED ORDER — GUAIFENESIN 100 MG/5ML PO SOLN: 15.0000 mL | Freq: Two times a day (BID) | ORAL | Status: DC | PRN

## 2018-08-30 MED ORDER — FUROSEMIDE 40 MG PO TABS: 40.0000 mg | ORAL_TABLET | Freq: Every day | ORAL | 4 refills | Status: DC

## 2018-08-30 MED ORDER — GUAIFENESIN 100 MG/5ML PO SOLN: 15.0000 mL | Freq: Two times a day (BID) | ORAL | 0 refills | Status: AC | PRN

## 2018-08-30 MED ORDER — JUVEN PO PACK: 1.0000 | PACK | Freq: Two times a day (BID) | ORAL | 0 refills | Status: AC

## 2018-08-30 MED ORDER — GUAIFENESIN 100 MG/5ML PO SOLN
15.0000 mL | Freq: Once | ORAL | Status: DC
  Filled 2018-08-30: qty 5

## 2018-08-30 NOTE — Progress Notes (Signed)
  Speech Language Pathology  Patient Details Name: KAESON KLEINERT MRN: [id number] DOB: 02/05/1925 Today's Date: 08/30/2018 Time:  -     Pt and wife declined swallow assessment (3rd attempt). Stated respiratory distress is due to fluid overload. Will sign off.                GO                Royce Macadamia 08/30/2018, 12:52 PM  Breck Coons Lonell Face.Ed Nurse, children's [phone] Office [phone]

## 2018-08-30 NOTE — Care Management Note (Signed)
Case Management Note  Patient Details  Name: Vincent Miranda MRN: [id number] Date of Birth: 1925-06-23  Subjective/Objective: Admitted with LE wounds.Hx of chronic osteomyelitis and nonhealing lower extremity wounds, peripheral vascular disease, chronic kidney disease stage III, hypertension, paroxysmal A. fib.   Vincent Miranda (Spouse) Vincent Miranda [phone])      [phone]        PCP: Maryelizabeth Kaufmann  Action/Plan: Declined SNF placement. Pt will transition to home with home health services to follow. Wife vs PTAR to provide transportation to home.  Expected Discharge Date:  08/30/18               Expected Discharge Plan:  Home w Home Health Services  In-House Referral:  Clinical Social Work  Discharge planning Services  CM Consult  Post Acute Care Choice:    Choice offered to:  Patient  DME Arranged:   N/A DME Agency:   N/A  HH Arranged:  RN, PT, OT, Nurse's Aide, Social Work [employer] Agency:  [facility]  Status of Service:  completed  If discussed at [employer], dates discussed:    Additional Comments:  Epifanio Lesches, RN 08/30/2018, 3:39 PM

## 2018-08-30 NOTE — Discharge Summary (Signed)
Name: Vincent Miranda MRN: [id number] DOB: 03-10-1925 84 y.o. PCP: Ali Lowe, MD  Date of Admission: 08/26/2018  1:39 PM Date of Discharge:  Attending Physician: No att. providers found  Discharge Diagnosis: 1. Sepsis due to acute on chronic non healing feet wound infection   Discharge Medications: Allergies as of 08/30/2018   No Known Allergies     Medication List    STOP taking these medications   ibuprofen 200 MG tablet Commonly known as:  ADVIL,MOTRIN     TAKE these medications   allopurinol 100 MG tablet Commonly known as:  ZYLOPRIM Take 2 tablets (200 mg total) by mouth daily.   aspirin EC 81 MG tablet Take 81 mg by mouth daily.   cephALEXin 500 MG capsule Commonly known as:  KEFLEX Take 1 capsule (500 mg total) by mouth 2 (two) times daily.   doxycycline 100 MG tablet Commonly known as:  VIBRA-TABS Take 1 tablet (100 mg total) by mouth 2 (two) times daily.   furosemide 40 MG tablet Commonly known as:  LASIX Take 1 tablet (40 mg total) by mouth daily. Start taking on:  09/01/2018 What changed:  Another medication with the same name was changed. Make sure you understand how and when to take each.   furosemide 20 MG tablet Commonly known as:  LASIX TAKE 1 TABLET BY MOUTH ONCE DAILY AS NEEDED IN  ADDITION  TO  40  MG  TABLET  WHEN  THERE  IS  EXTRA  SWELLING Start taking on:  09/01/2018 What changed:    how much to take  how to take this  when to take this  reasons to take this  additional instructions   guaiFENesin 100 MG/5ML Soln Commonly known as:  ROBITUSSIN Take 15 mLs (300 mg total) by mouth 2 (two) times daily as needed for up to 3 days for cough or to loosen phlegm.   multivitamin with minerals Tabs tablet Take 1 tablet by mouth daily.   nutrition supplement (JUVEN) Pack Take 1 packet by mouth 2 (two) times daily between meals.   Potassium 95 MG Tabs Take 1 tablet (95 mg total) by mouth daily. Start taking on:   09/01/2018   silver sulfADIAZINE 1 % cream Commonly known as:  SILVADENE apply to affected area once daily What changed:    how much to take  how to take this  when to take this  additional instructions   Zinc 50 MG Tabs Take 50 mg by mouth daily.       Disposition and follow-up:   Vincent Miranda was discharged from [facility] in Stable condition.  At the hospital follow up visit please address:  1.  Patient with acute on chronic wound infection with Hx.  of chronic osteomyelitis and chronic non healing wound, not interested in amputation. Patient treated with Vancomycin and Cefepime during this admission. Responded well to this treatment. Patient and his wife do Not prefer SNF with IV antibiotic. So he is discharged with PO Doxycycline and Keflex for 6 weeks (He has Hx. of + MRSA in previous admissions) then may put on daily suppressing antibiotic.  2.  Patient developed some AKI on CKD on this admission. Cr. at discharge : 2.51. Please monitor BMP in follow up visit as patient will resume Lasix in 2 days after discharge.   3. Hx of CHF: Lasix held during hospitalization (except to dose of IV lasix 40 due to SOB) Euvolemic at discharge. Planed to  resume Lasix in 2 days after discharge. Please check volume status at follow up.  4. Patient has Hx of paroxymal Afib that has not been on anticoagulant per family wishes.  Labs / imaging needed at time of follow-up: BMP, CBC  3.  Pending labs/ test needing follow-up: None  Follow-up Appointments: [facility] within a week. (Sent request for schedule to IMP pool) Follow-up Information    [facility] up.   Specialty:  Home Health Services Why:  home health services arranged Contact information: [address] [place] [phone]           Hospital Course by problem list: Vincent Miranda is a 84 yo M with PMHx significant for chronic osteomyelitis and lower extremity  wounds (not interested in amputation) ,PVD, CKD, HTN, a-fib.Not on any anticoagulation due to patient's choice.  1. He p/w worsening generalized weakness, decreased appetite and worsening bilateral foot pain and purulent drainage from his chronic wound. Found to have Sepsis. Received cefepime and vancomycin x5 days and clinically improved.   2. AKI: Patient presented with some AKI on CKD on this admission in setting of sepsis and dehydration.Creatinine 3.79 on admission. ( baseline of 1.5-1.7). Managed with IV fluid and holding lasix. Cr. at discharge : 2.51.   3. Developed normocytic anemia with Hb of 6.8. No evidence of bleeding or hemolysis found. Likely in setting of IDA and anemia of chronic disease. (Ferritin and iron were not reliable due to acute infection). Hb remained stable after 1 unit of PRBC.   Discharge Vitals:   BP 131/71 (BP Location: Right Arm)   Pulse 80   Temp 98.2 F (36.8 C) (Oral)   Resp 18   Ht 5\' 11"  (1.803 m)   Wt 160 lb 4.4 oz (72.7 kg)   SpO2 98%   BMI 22.35 kg/m   Pertinent Labs, Studies, and Procedures:  CBC Latest Ref Rng & Units 08/30/2018 08/29/2018 08/28/2018  WBC 4.0 - 10.5 K/uL 10.8(H) 13.1(H) 14.2(H)  Hemoglobin 13.0 - 17.0 g/dL 10.4(L) 9.7(L) 9.3(L)  Hematocrit 39.0 - 52.0 % 34.5(L) 32.6(L) 29.7(L)  Platelets 150 - 400 K/uL 266 281 264   BMP Latest Ref Rng & Units 08/30/2018 08/29/2018 08/28/2018  Glucose 70 - 99 mg/dL 90 87 80  BUN 8 - 23 mg/dL 91(Y) 78(G) 95(A)  Creatinine 0.61 - 1.24 mg/dL 2.13(Y) 8.65(H) 8.46(N)  BUN/Creat Ratio 10 - 24 - - -  Sodium 135 - 145 mmol/L 147(H) 145 142  Potassium 3.5 - 5.1 mmol/L 4.3 3.8 3.4(L)  Chloride 98 - 111 mmol/L 116(H) 117(H) 115(H)  CO2 22 - 32 mmol/L 17(L) 19(L) 19(L)  Calcium 8.9 - 10.3 mg/dL 9.3 9.1 6.2(X)  CXR at discharge: No acute cardio pulmonary abnormality. No Pulmonary edema.   Discharge Instructions:   Signed: Chevis Pretty, MD 08/31/2018, 3:01 PM   Pager: [phone]

## 2018-08-30 NOTE — ED Provider Notes (Signed)
[place] [facility] PROGRESSIVE CARE Provider Note   CSN: [id number] Arrival date & time: 08/26/18  1339     History   Chief Complaint Chief Complaint  Patient presents with  . Weakness    HPI Vincent Miranda is a 84 y.o. male.  Patient was brought to the emergency department by his wife because he was dizzy and his blood pressure has been low  The history is provided by the patient. No language interpreter was used.  Weakness  Primary symptoms include focal weakness. This is a new problem. The current episode started 12 to 24 hours ago. The problem has not changed since onset.There was no focality noted. There has been no fever. Pertinent negatives include no shortness of breath, no chest pain and no headaches. There were no medications administered prior to arrival. Associated medical issues do not include trauma.    Past Medical History:  Diagnosis Date  . Acute blood loss anemia 10/18/2013  . Anemia of chronic disease   . Atrial fibrillation (HCC)   . Chronic kidney disease   . Chronic osteomyelitis (HCC)   . Gout   . History of blood transfusion    "related to anemia" (07/16/2017)  . Hypertension   . PVD (peripheral vascular disease) [facility])     Patient Active Problem List   Diagnosis Date Noted  . Malnutrition of moderate degree 08/27/2018  . Non-healing wound of lower extremity 08/26/2018  . Chronic skin ulcer with fat layer exposed (HCC) 07/11/2017  . AKI (acute kidney injury) (HCC) 04/18/2017  . Chronic osteomyelitis (HCC) 12/09/2016  . Rash and nonspecific skin eruption 09/08/2016  . Abnormal urinalysis 09/08/2016  . Paroxysmal atrial fibrillation (HCC) 03/01/2016  . Urinary retention 02/15/2016  . Gout 02/03/2016  . PVD (peripheral vascular disease) (HCC) 03/28/2015  . Anemia of chronic disease   . CKD (chronic kidney disease), stage III (HCC) 10/18/2013  . Essential hypertension   . Protein-calorie malnutrition, severe (HCC) 08/07/2013    Past  Surgical History:  Procedure Laterality Date  . AMPUTATION Left 10/19/2013   Procedure: AMPUTATION RAY  LEFT GREAT TOE;  Surgeon: Nadara Mustard, MD;  Location: [facility] OR;  Service: Orthopedics;  Laterality: Left;  . EYE SURGERY Right    "had accident; had to replace lens in eye"        Home Medications    Prior to Admission medications   Medication Sig Start Date End Date Taking? Authorizing Provider  allopurinol (ZYLOPRIM) 100 MG tablet Take 2 tablets (200 mg total) by mouth daily. 05/15/18  Yes Gwynn Burly, DO  aspirin EC 81 MG tablet Take 81 mg by mouth daily.    Yes [provider]  furosemide (LASIX) 20 MG tablet TAKE 1 TABLET BY MOUTH ONCE DAILY AS NEEDED IN  ADDITION  TO  40  MG  TABLET  WHEN  THERE  IS  EXTRA  SWELLING Patient taking differently: Take 20 mg by mouth daily as needed for fluid or edema.  05/29/18  Yes Tyson Alias, MD  furosemide (LASIX) 40 MG tablet TAKE 1 TABLET BY MOUTH ONCE DAILY Patient taking differently: Take 40 mg by mouth daily.  07/29/18  Yes Ali Lowe, MD  ibuprofen (ADVIL,MOTRIN) 200 MG tablet Take 200 mg by mouth every 6 (six) hours as needed for moderate pain.    Yes [provider]  Potassium 95 MG TABS Take 95 mg by mouth daily.   Yes [provider]  silver sulfADIAZINE (SSD) 1 %  cream apply to affected area once daily Patient taking differently: Apply 1 application topically daily.  08/12/18  Yes Earl Lagos, MD  Zinc 50 MG TABS Take 50 mg by mouth daily.   Yes [provider]    Family History History reviewed. No pertinent family history.  Social History Social History   Tobacco Use  . Smoking status: Former Smoker    Packs/day: 1.00    Years: 43.00    Pack years: 43.00    Types: Cigarettes  . Smokeless tobacco: Never Used  . Tobacco comment: "quit smoking cigarettes in the 1980s"  Substance Use Topics  . Alcohol use: Not Currently    Comment: "nothing anymore" (02/03/2016)  .  Drug use: No     Allergies   Patient has no known allergies.   Review of Systems Review of Systems  Constitutional: Negative for appetite change and fatigue.  HENT: Negative for congestion, ear discharge and sinus pressure.   Eyes: Negative for discharge.  Respiratory: Negative for cough and shortness of breath.   Cardiovascular: Negative for chest pain.  Gastrointestinal: Negative for abdominal pain and diarrhea.  Genitourinary: Negative for frequency and hematuria.  Musculoskeletal: Negative for back pain.  Skin: Negative for rash.  Neurological: Positive for focal weakness and weakness. Negative for seizures and headaches.  Psychiatric/Behavioral: Negative for hallucinations.     Physical Exam Updated Vital Signs BP (!) 106/59 (BP Location: Right Arm)   Pulse 61   Temp 98.5 F (36.9 C) (Oral)   Resp 18   Ht 5\' 11"  (1.803 m)   Wt 72.7 kg   SpO2 100%   BMI 22.35 kg/m   Physical Exam  Constitutional: He is oriented to person, place, and time. He appears well-developed.  HENT:  Head: Normocephalic.  Eyes: Conjunctivae and EOM are normal. No scleral icterus.  Neck: Neck supple. No thyromegaly present.  Cardiovascular: Normal rate and regular rhythm. Exam reveals no gallop and no friction rub.  No murmur heard. Pulmonary/Chest: No stridor. He has no wheezes. He has no rales. He exhibits no tenderness.  Abdominal: He exhibits no distension. There is no tenderness. There is no rebound.  Musculoskeletal: Normal range of motion. He exhibits no edema.  Lymphadenopathy:    He has no cervical adenopathy.  Neurological: He is oriented to person, place, and time. He exhibits normal muscle tone. Coordination normal.  Skin: No rash noted. No erythema.  Psychiatric: He has a normal mood and affect. His behavior is normal.     ED Treatments / Results  Labs (all labs ordered are listed, but only abnormal results are displayed) Labs Reviewed  CBC WITH DIFFERENTIAL/PLATELET -  Abnormal; Notable for the following components:      Result Value   WBC 13.8 (*)    Hemoglobin 12.8 (*)    MCH 25.9 (*)    RDW 17.2 (*)    Neutro Abs 12.7 (*)    Lymphs Abs 0.3 (*)    All other components within normal limits  COMPREHENSIVE METABOLIC PANEL - Abnormal; Notable for the following components:   Potassium 3.4 (*)    CO2 20 (*)    BUN 66 (*)    Creatinine, Ser 3.79 (*)    Calcium 8.3 (*)    Albumin 1.8 (*)    AST 9 (*)    GFR calc non Af Amer 13 (*)    GFR calc Af Amer 15 (*)    All other components within normal limits  PREALBUMIN - Abnormal; Notable  for the following components:   Prealbumin 7.2 (*)    All other components within normal limits  BASIC METABOLIC PANEL - Abnormal; Notable for the following components:   Chloride 112 (*)    CO2 20 (*)    BUN 59 (*)    Creatinine, Ser 3.19 (*)    Calcium 8.3 (*)    GFR calc non Af Amer 16 (*)    GFR calc Af Amer 18 (*)    All other components within normal limits  CBC - Abnormal; Notable for the following components:   WBC 20.1 (*)    RBC 2.72 (*)    Hemoglobin 7.0 (*)    HCT 22.4 (*)    MCH 25.7 (*)    RDW 16.5 (*)    All other components within normal limits  MAGNESIUM - Abnormal; Notable for the following components:   Magnesium 1.6 (*)    All other components within normal limits  TROPONIN I - Abnormal; Notable for the following components:   Troponin I 0.06 (*)    All other components within normal limits  TROPONIN I - Abnormal; Notable for the following components:   Troponin I 0.06 (*)    All other components within normal limits  TROPONIN I - Abnormal; Notable for the following components:   Troponin I 0.06 (*)    All other components within normal limits  HEMOGLOBIN AND HEMATOCRIT, BLOOD - Abnormal; Notable for the following components:   Hemoglobin 6.8 (*)    HCT 21.8 (*)    All other components within normal limits  COMPREHENSIVE METABOLIC PANEL - Abnormal; Notable for the following  components:   Chloride 113 (*)    CO2 19 (*)    Glucose, Bld 110 (*)    BUN 57 (*)    Creatinine, Ser 3.08 (*)    Calcium 8.5 (*)    Total Protein 6.3 (*)    Albumin 1.5 (*)    AST 9 (*)    GFR calc non Af Amer 16 (*)    GFR calc Af Amer 19 (*)    All other components within normal limits  LACTATE DEHYDROGENASE - Abnormal; Notable for the following components:   LDH 93 (*)    All other components within normal limits  RETICULOCYTES - Abnormal; Notable for the following components:   RBC. 2.75 (*)    All other components within normal limits  IRON AND TIBC - Abnormal; Notable for the following components:   Iron 13 (*)    TIBC 126 (*)    Saturation Ratios 10 (*)    All other components within normal limits  BASIC METABOLIC PANEL - Abnormal; Notable for the following components:   CO2 19 (*)    Glucose, Bld 103 (*)    BUN 56 (*)    Creatinine, Ser 2.86 (*)    Calcium 8.5 (*)    GFR calc non Af Amer 18 (*)    GFR calc Af Amer 21 (*)    All other components within normal limits  HEMOGLOBIN AND HEMATOCRIT, BLOOD - Abnormal; Notable for the following components:   Hemoglobin 8.6 (*)    HCT 28.1 (*)    All other components within normal limits  CBC - Abnormal; Notable for the following components:   WBC 14.2 (*)    RBC 3.51 (*)    Hemoglobin 9.3 (*)    HCT 29.7 (*)    RDW 16.5 (*)    All other components within normal limits  BASIC METABOLIC PANEL - Abnormal; Notable for the following components:   Potassium 3.4 (*)    Chloride 115 (*)    CO2 19 (*)    BUN 53 (*)    Creatinine, Ser 2.82 (*)    Calcium 8.6 (*)    GFR calc non Af Amer 18 (*)    GFR calc Af Amer 21 (*)    All other components within normal limits  CBC - Abnormal; Notable for the following components:   WBC 13.1 (*)    RBC 3.75 (*)    Hemoglobin 9.7 (*)    HCT 32.6 (*)    MCH 25.9 (*)    MCHC 29.8 (*)    RDW 16.6 (*)    All other components within normal limits  BASIC METABOLIC PANEL - Abnormal;  Notable for the following components:   Chloride 117 (*)    CO2 19 (*)    BUN 49 (*)    Creatinine, Ser 2.48 (*)    GFR calc non Af Amer 21 (*)    GFR calc Af Amer 25 (*)    All other components within normal limits  BASIC METABOLIC PANEL - Abnormal; Notable for the following components:   Sodium 147 (*)    Chloride 116 (*)    CO2 17 (*)    BUN 50 (*)    Creatinine, Ser 2.51 (*)    GFR calc non Af Amer 21 (*)    GFR calc Af Amer 24 (*)    All other components within normal limits  CULTURE, BLOOD (ROUTINE X 2)  CULTURE, BLOOD (ROUTINE X 2)  LACTATE DEHYDROGENASE  URINALYSIS, ROUTINE W REFLEX MICROSCOPIC  OCCULT BLOOD X 1 CARD TO LAB, STOOL  SAVE SMEAR  FERRITIN  MAGNESIUM  MAGNESIUM  CBC  TYPE AND SCREEN  PREPARE RBC (CROSSMATCH)  PREPARE RBC (CROSSMATCH)    EKG EKG Interpretation  Date/Time:  Monday August 26 2018 13:50:44 EDT Ventricular Rate:  106 PR Interval:    QRS Duration: 134 QT Interval:  368 QTC Calculation: 489 R Axis:   159 Text Interpretation:  Sinus tachycardia Atrial premature complex RBBB and LPFB Confirmed by Bethann Berkshire [phone]) on 08/26/2018 4:17:20 PM   Radiology No results found.  Procedures Procedures (including critical care time)  Medications Ordered in ED Medications  sodium chloride flush (NS) 0.9 % injection 3 mL (3 mLs Intravenous Given 08/29/18 2101)  acetaminophen (TYLENOL) tablet 650 mg (has no administration in time range)    Or  acetaminophen (TYLENOL) suppository 650 mg (has no administration in time range)  HYDROcodone-acetaminophen (NORCO/VICODIN) 5-325 MG per tablet 1-2 tablet (1 tablet Oral Given 08/27/18 1228)  polyethylene glycol (MIRALAX / GLYCOLAX) packet 17 g (has no administration in time range)  allopurinol (ZYLOPRIM) tablet 200 mg (200 mg Oral Given 08/29/18 0839)  ceFEPIme (MAXIPIME) 500 mg in dextrose 5 % 50 mL IVPB (0 mg Intravenous Stopping Infusion hung by another clincian 08/30/18 0700)  silver  sulfADIAZINE (SILVADENE) 1 % cream ( Topical Given 08/29/18 1938)  nutrition supplement (JUVEN) (JUVEN) powder packet 1 packet (1 packet Oral Not Given 08/29/18 1458)  multivitamin with minerals tablet 1 tablet (1 tablet Oral Given 08/29/18 0840)  vancomycin (VANCOCIN) IVPB 1000 mg/200 mL premix (0 mg Intravenous Stopping Infusion hung by another clincian 08/30/18 0700)  sodium chloride 0.9 % bolus 500 mL (0 mLs Intravenous Stopped 08/26/18 1623)  magnesium sulfate IVPB 1 g 100 mL (0 g Intravenous Stopped 08/27/18 0557)  potassium chloride SA (K-DUR,KLOR-CON)  CR tablet 40 mEq (40 mEq Oral Given 08/27/18 0058)  0.9 %  sodium chloride infusion (Manually program via Guardrails IV Fluids) ( Intravenous Stopped 08/27/18 2221)  magnesium sulfate IVPB 2 g 50 mL (0 g Intravenous Stopped 08/27/18 2220)  0.9 %  sodium chloride infusion (Manually program via Guardrails IV Fluids) (10 mLs Intravenous New Bag/Given 08/27/18 1808)  furosemide (LASIX) injection 40 mg (40 mg Intravenous Given 08/28/18 2201)  potassium chloride SA (K-DUR,KLOR-CON) CR tablet 40 mEq (40 mEq Oral Given 08/28/18 2215)  furosemide (LASIX) injection 40 mg (40 mg Intravenous Given 08/30/18 0333)  sodium bicarbonate tablet 325 mg (325 mg Oral Given 08/30/18 0900)     Initial Impression / Assessment and Plan / ED Course  I have reviewed the triage vital signs and the nursing notes.  Pertinent labs & imaging results that were available during my care of the patient were reviewed by me and considered in my medical decision making (see chart for details).    Patient with orthostatic hypotension.  He will be admitted for further evaluation by his medicine service  Final Clinical Impressions(s) / ED Diagnoses   Final diagnoses:  Shortness of breath    ED Discharge Orders    None       Bethann Berkshire, MD 08/30/18 1051

## 2018-08-30 NOTE — Progress Notes (Signed)
Pt discharged home with home health at this time. Discharge instructions provided to Pt and spouse. Both verbalized understanding. All Pt belongings packed and accompanied Pt. VS stable. Pt denied pain. Pt discharged via wheelchair to private car.

## 2018-08-30 NOTE — Progress Notes (Addendum)
Subjective: Patient was seen and evaluated today. He is sitting on the bed. In no acute distress.  Has had some shortness of breath and some cough last night that resolved with oxygen (and lasix). Wife says that he could not bring up any mucus with cough. Denies chest pain or shortness of breath currently.  Objective:  Vital signs in last 24 hours: Vitals:   08/29/18 1304 08/29/18 2209 08/30/18 0459 08/30/18 0500  BP: 129/67 (!) 156/96 (!) 106/59   Pulse: (!) 102 (!) 109 61   Resp: 18 18 18    Temp: 97.8 F (36.6 C) 98.1 F (36.7 C) 98.5 F (36.9 C)   TempSrc: Oral Oral Oral   SpO2: 98% 94% 100%   Weight:    72.7 kg  Height:       Physical Exam  Constitutional: He appears well-developed. No distress.  HENT:  Head: Normocephalic and atraumatic.  Cardiovascular: Normal rate, regular rhythm and normal heart sounds.  No murmur heard. Pulmonary/Chest: Effort normal and breath sounds normal. He has no wheezes. He has mild basilar crackle at right lower field. Abdominal: Soft. Bowel sounds are normal. There is no tenderness.  Musculoskeletal: He exhibits no edema.   Assessment/Plan:  Principal Problem:   Non-healing wound of lower extremity Active Problems:   Essential hypertension   CKD (chronic kidney disease), stage III (HCC)   Anemia of chronic disease   PVD (peripheral vascular disease) (HCC)   Paroxysmal atrial fibrillation (HCC)   Chronic osteomyelitis (HCC)   AKI (acute kidney injury) (HCC)   Malnutrition of moderate degree Episode of SOB, cough:  Reports transient SOB in last 2 nights. No chest pain.  Some transient cough, felt he has phlegm on his throat but could not bring them up with cough.S/p IV lasix over night. O2sat has been normal when short of breath at night. Has been on nasal O2 since night, has not tried to take it off. No more sob. No chest pain. Has Hx of CHF, off of Lasix due to AKI and dehydration. But euvolemic on exam: (Lung exam shows mild  basilar crackle at right side. Otherwise nonsignificant) No LLE. SOB seems to be mostly 2/2 upper respiratory congestion v/s CHF volume overload that responded to Lasix last night. No sign of PNA. On Vanc and Cefipim.  -Mucinex 600 mg BID x 2days -CXR -Wean off of nasal O2 and Observe the patient   -May resume Lasix in 2 days based on kidney function and BP.  Acute on chronic bilateral lower extremities wound infection: In setting of chronic osteomyelitis and with Hx of peripheral vascular disease. (Family did not want amputation or biopsy before)  On Vanc and Cefepim (day 5). Has responded well.  No pain. Afebrile. Not toxic or ill.  Wound wrraped.  Clean bandage.  Blood culture has been negative since admission -C/w Vanc and Cefipim  -C/w wound care  -May Dc tomorrow with Doxyxycline and Keflex for 6 weeks  AKI: in setting of CKD III and  2/2 dehydration. Improved since admission but remain aroud 2.5 since yesterday. IV fluid held 2 nights ago due to SOB and concern for volume overload.  Has non anion gap metabolic acidosis with HCO3 of 17 today. Might be 2/2 uremia vs RTA2. (Urine PH:5, K when off of Lasix: low to nl.) S/p 1 dose of PO NaHco3. Due to mild hypernatremia, wont continue that.  -Monitor BMP  Lab Results  Component Value Date   CREATININE 2.51 (H) 08/30/2018  CREATININE 2.48 (H) 08/29/2018   CREATININE 2.82 (H) 08/28/2018   Normocitic anemia: HB has been stable until yesterday. AM CBC has not collected. Called the lab and will collect. -Follow up    Diet: Regular VTE ppx: Levonox IV fluid: none Code status: Full   Dispo: Anticipated discharge in 1-2 days  Chevis Pretty, MD 08/30/2018, 11:08 AM Pager: [phone]

## 2018-08-30 NOTE — Discharge Instructions (Signed)
Thanks for allowing [place] taking care of you at [facility].  We are glad you feel better. Please take your medications as instructed. As a reminder, please hold your lasix for another day and then restart. Please follow up with your PCP early next week. (Some one from our clinic will contact you to let you know about the exact day and time of schedule.) Please let us know if you have any question or concern.  Thanks, Dr. Maryla Morrow

## 2018-08-30 NOTE — Progress Notes (Signed)
Internal Medicine Attending:   I saw and examined the patient. I reviewed the resident's note and I agree with the resident's findings and plan as documented in the resident's note.   Patient again had an episode of shortness of breath overnight was evaluated by her overnight team and given another dose of IV Lasix.  Wife reports that she noticed some nonproductive cough yesterday and a brief episode of shortness of breath but seemed to be self-limited. Discussed recommendations for skilled nursing facility however patient and his wife note previously bad experience they do not want to go back to skilled nurse facility if it can be avoided.  We also discussed the option of home health he does not want any home health physical therapist out there they seem a little mixed about whether a one-time visit from a nurse would be beneficial.  On my examination he is currently resting comfortably he has supplemental O2 at 2.5 L via nasal cannula, pulmonary exam is revealing of mild right basilar crackles otherwise clear, I do not appreciate any JVD, heart is regular rate and rhythm, abdomen soft nontender, he is trace pedal edema, bilateral foot wounds appear to have less drainage.  Assessment and plan Shortness of breath/ cough -Appears there is some concern for pulmonary edema, however I do not think this is the case my examination he appears more euvolemic and is also now hypernatremic and creatinine has slightly increased.  My suspicion is that his cough may be related to some atelectasis we repeated a chest x-ray which did not show any focal infiltrate or pulmonary edema it does show some right lower lobe atelectasis. Incentive spirometry -Wean supplemental O2, determine if actually has O2 requirement, if so this could be provided at home.  AKI on CKD stage III -Creatinine has trended down from admission, I suspect slight bump today is related to the Lasix dose. -I discussed I think that it is fine for  the patient to be discharged, I would hold his Lasix for at least another day and he can restart on Sunday  Chronic osteomyelitis, nonhealing foot wounds -Discussed treatment options with family including SNF with IV antibiotics, home health with IV antibiotics, or again course of oral antibiotics their preference is clearly for oral antibiotics, I think this is a reasonable choice I do want to have MRSA coverage and have doxycycline however he also has some cellulitis I think dual coverage with cephalexin and doxycycline is a reasonable course.  I would continue this for 6 weeks, after this time I would consider putting him on a daily suppressive antibiotic  Dispo: Patient refused skilled nurse facility wants limited home health

## 2018-08-30 NOTE — Progress Notes (Signed)
Pt complains of SOB. Pt not in any apparent distress. Upon assessment O2 sat 100% and crackles heard. Pt had a similar episode last night and improved after give Lasix. MD notified.

## 2018-08-30 NOTE — Progress Notes (Addendum)
Paged by RN about dyspnea. Examined and evaluated [name] at bedside. He states he feels he is out of breath w/o any chest pain, palpitations, cough or wheezing. He requests to be sit up more upright. When bed raised to 75 degree angle, he endorse some improvement. Denies any light-headedness, dizziness or headaches.  Gen: NAD HEENT: EOMI, PERRL,  MMM Neck: supple, ROM intact, no JVD, no cervical adenopathy CV: RRR, S1, S2 normal,  Pulm: Bibasilar rales up to 1/3 thorax  Abd: Soft, BS+, NTND Extm: Lower extremities covered in bandaging, No obvious peripheral edema Skin: Dry, Warm, normal turgor  Vincent Miranda complaining of dyspnea and orthopnea. He has hx of CHF and is on furosemide 40mg  PO at home which was stopped due to AKI. Lung exam improved compared to yesterday but continuing to endorse bibasilar rales. O2 sat is 100%. No urine output recorded on I&Os but 800cc in condom cath bag. No daily weights - 1 time dose of Lasix 40mg  - Will clarify with nursing staff about I&Os & daily weights - F/u BMP in AM

## 2018-08-30 NOTE — Progress Notes (Signed)
CSW received consult regarding PT recommendation of SNF at discharge.  Patient's spouse is refusing SNF. CSW questioned if they wanted home health but patient's wife stated that she is not sure that they need it. They have used [facility] in the past.   CSW signing off.   Osborne Casco Adiel Mcnamara LCSW [phone]

## 2018-08-31 LAB — CULTURE, BLOOD (ROUTINE X 2)
CULTURE: NO GROWTH
Culture: NO GROWTH
SPECIAL REQUESTS: ADEQUATE
SPECIAL REQUESTS: ADEQUATE

## 2018-09-02 ENCOUNTER — Telehealth: Payer: Self-pay | Admitting: Internal Medicine

## 2018-09-02 ENCOUNTER — Telehealth: Payer: Self-pay

## 2018-09-02 NOTE — Telephone Encounter (Signed)
TOC HFU appt offered for this week but, his wife is unable to bring him, due to wife time out of work for his hospitalized.  HFU sch for 09/12/2018 with PCP Dr. Petra Kuba.

## 2018-09-02 NOTE — Telephone Encounter (Signed)
ERROR

## 2018-09-11 ENCOUNTER — Other Ambulatory Visit: Payer: Self-pay | Admitting: Internal Medicine

## 2018-09-12 ENCOUNTER — Ambulatory Visit: Payer: Medicare Other | Admitting: Internal Medicine

## 2018-09-12 NOTE — Telephone Encounter (Signed)
Patient cancelled today's HFU appt. Kinnie Feil, RN, BSN

## 2018-09-16 ENCOUNTER — Telehealth: Payer: Self-pay

## 2018-09-16 NOTE — Telephone Encounter (Signed)
Needs to speak with a nurse about silver sulfADIAZINE (SSD) 1 % cream. Please call back.

## 2018-09-16 NOTE — Telephone Encounter (Signed)
Returned call to patient, no answer.  Left message to call RN back.  SChaplin,RN   

## 2018-09-16 NOTE — Telephone Encounter (Signed)
Telephone call to patient's wife, no answer.  Message left to call RN back.

## 2018-09-16 NOTE — Telephone Encounter (Signed)
Patient's wife called back to speak with a nurse.

## 2018-09-17 ENCOUNTER — Emergency Department (HOSPITAL_COMMUNITY): Payer: Medicare Other

## 2018-09-17 ENCOUNTER — Inpatient Hospital Stay (HOSPITAL_COMMUNITY)
Admission: EM | Admit: 2018-09-17 | Discharge: 2018-09-25 | DRG: 682 | Disposition: A | Discharge status: Home Health | Payer: Medicare Other | Attending: Internal Medicine | Admitting: Internal Medicine

## 2018-09-17 ENCOUNTER — Inpatient Hospital Stay (HOSPITAL_COMMUNITY): Payer: Medicare Other

## 2018-09-17 DIAGNOSIS — A419 Sepsis, unspecified organism: Secondary | ICD-10-CM | POA: Diagnosis not present

## 2018-09-17 DIAGNOSIS — Z6820 Body mass index (BMI) 20.0-20.9, adult: Secondary | ICD-10-CM | POA: Diagnosis not present

## 2018-09-17 DIAGNOSIS — M86671 Other chronic osteomyelitis, right ankle and foot: Secondary | ICD-10-CM | POA: Diagnosis not present

## 2018-09-17 DIAGNOSIS — G9341 Metabolic encephalopathy: Secondary | ICD-10-CM | POA: Diagnosis present

## 2018-09-17 DIAGNOSIS — Z7401 Bed confinement status: Secondary | ICD-10-CM | POA: Diagnosis not present

## 2018-09-17 DIAGNOSIS — M8660 Other chronic osteomyelitis, unspecified site: Secondary | ICD-10-CM | POA: Diagnosis present

## 2018-09-17 DIAGNOSIS — R5381 Other malaise: Secondary | ICD-10-CM | POA: Diagnosis not present

## 2018-09-17 DIAGNOSIS — L97529 Non-pressure chronic ulcer of other part of left foot with unspecified severity: Secondary | ICD-10-CM | POA: Diagnosis not present

## 2018-09-17 DIAGNOSIS — R1310 Dysphagia, unspecified: Secondary | ICD-10-CM | POA: Diagnosis not present

## 2018-09-17 DIAGNOSIS — S91302A Unspecified open wound, left foot, initial encounter: Secondary | ICD-10-CM | POA: Diagnosis present

## 2018-09-17 DIAGNOSIS — R6520 Severe sepsis without septic shock: Secondary | ICD-10-CM

## 2018-09-17 DIAGNOSIS — E43 Unspecified severe protein-calorie malnutrition: Secondary | ICD-10-CM | POA: Diagnosis not present

## 2018-09-17 DIAGNOSIS — Z8673 Personal history of transient ischemic attack (TIA), and cerebral infarction without residual deficits: Secondary | ICD-10-CM | POA: Diagnosis not present

## 2018-09-17 DIAGNOSIS — Z89421 Acquired absence of other right toe(s): Secondary | ICD-10-CM | POA: Diagnosis not present

## 2018-09-17 DIAGNOSIS — R1319 Other dysphagia: Secondary | ICD-10-CM | POA: Diagnosis not present

## 2018-09-17 DIAGNOSIS — I739 Peripheral vascular disease, unspecified: Secondary | ICD-10-CM | POA: Diagnosis present

## 2018-09-17 DIAGNOSIS — R0902 Hypoxemia: Secondary | ICD-10-CM | POA: Diagnosis not present

## 2018-09-17 DIAGNOSIS — N184 Chronic kidney disease, stage 4 (severe): Secondary | ICD-10-CM | POA: Diagnosis present

## 2018-09-17 DIAGNOSIS — N183 Chronic kidney disease, stage 3 unspecified: Secondary | ICD-10-CM | POA: Diagnosis present

## 2018-09-17 DIAGNOSIS — E871 Hypo-osmolality and hyponatremia: Secondary | ICD-10-CM | POA: Diagnosis present

## 2018-09-17 DIAGNOSIS — B3781 Candidal esophagitis: Secondary | ICD-10-CM | POA: Diagnosis present

## 2018-09-17 DIAGNOSIS — I480 Paroxysmal atrial fibrillation: Secondary | ICD-10-CM | POA: Diagnosis present

## 2018-09-17 DIAGNOSIS — M86471 Chronic osteomyelitis with draining sinus, right ankle and foot: Secondary | ICD-10-CM | POA: Diagnosis not present

## 2018-09-17 DIAGNOSIS — D631 Anemia in chronic kidney disease: Secondary | ICD-10-CM | POA: Diagnosis present

## 2018-09-17 DIAGNOSIS — E876 Hypokalemia: Secondary | ICD-10-CM | POA: Diagnosis present

## 2018-09-17 DIAGNOSIS — B370 Candidal stomatitis: Secondary | ICD-10-CM | POA: Diagnosis present

## 2018-09-17 DIAGNOSIS — E440 Moderate protein-calorie malnutrition: Secondary | ICD-10-CM

## 2018-09-17 DIAGNOSIS — M7989 Other specified soft tissue disorders: Secondary | ICD-10-CM | POA: Diagnosis not present

## 2018-09-17 DIAGNOSIS — I10 Essential (primary) hypertension: Secondary | ICD-10-CM | POA: Diagnosis present

## 2018-09-17 DIAGNOSIS — R000 Tachycardia, unspecified: Secondary | ICD-10-CM | POA: Diagnosis not present

## 2018-09-17 DIAGNOSIS — T364X5A Adverse effect of tetracyclines, initial encounter: Secondary | ICD-10-CM | POA: Diagnosis present

## 2018-09-17 DIAGNOSIS — I5030 Unspecified diastolic (congestive) heart failure: Secondary | ICD-10-CM | POA: Diagnosis not present

## 2018-09-17 DIAGNOSIS — Z515 Encounter for palliative care: Secondary | ICD-10-CM | POA: Diagnosis not present

## 2018-09-17 DIAGNOSIS — I959 Hypotension, unspecified: Secondary | ICD-10-CM | POA: Diagnosis present

## 2018-09-17 DIAGNOSIS — Z87891 Personal history of nicotine dependence: Secondary | ICD-10-CM

## 2018-09-17 DIAGNOSIS — L98499 Non-pressure chronic ulcer of skin of other sites with unspecified severity: Secondary | ICD-10-CM | POA: Diagnosis not present

## 2018-09-17 DIAGNOSIS — E870 Hyperosmolality and hypernatremia: Secondary | ICD-10-CM | POA: Diagnosis not present

## 2018-09-17 DIAGNOSIS — R64 Cachexia: Secondary | ICD-10-CM | POA: Diagnosis not present

## 2018-09-17 DIAGNOSIS — R1314 Dysphagia, pharyngoesophageal phase: Secondary | ICD-10-CM | POA: Diagnosis present

## 2018-09-17 DIAGNOSIS — M86672 Other chronic osteomyelitis, left ankle and foot: Secondary | ICD-10-CM | POA: Diagnosis present

## 2018-09-17 DIAGNOSIS — N179 Acute kidney failure, unspecified: Secondary | ICD-10-CM | POA: Diagnosis not present

## 2018-09-17 DIAGNOSIS — I509 Heart failure, unspecified: Secondary | ICD-10-CM | POA: Diagnosis present

## 2018-09-17 DIAGNOSIS — R627 Adult failure to thrive: Secondary | ICD-10-CM | POA: Diagnosis present

## 2018-09-17 DIAGNOSIS — M2550 Pain in unspecified joint: Secondary | ICD-10-CM | POA: Diagnosis not present

## 2018-09-17 DIAGNOSIS — I771 Stricture of artery: Secondary | ICD-10-CM | POA: Diagnosis present

## 2018-09-17 DIAGNOSIS — R531 Weakness: Secondary | ICD-10-CM | POA: Diagnosis not present

## 2018-09-17 DIAGNOSIS — S91301A Unspecified open wound, right foot, initial encounter: Secondary | ICD-10-CM | POA: Diagnosis present

## 2018-09-17 DIAGNOSIS — R404 Transient alteration of awareness: Secondary | ICD-10-CM | POA: Diagnosis not present

## 2018-09-17 DIAGNOSIS — Z7982 Long term (current) use of aspirin: Secondary | ICD-10-CM

## 2018-09-17 DIAGNOSIS — I4510 Unspecified right bundle-branch block: Secondary | ICD-10-CM | POA: Diagnosis present

## 2018-09-17 DIAGNOSIS — E860 Dehydration: Secondary | ICD-10-CM | POA: Diagnosis not present

## 2018-09-17 DIAGNOSIS — R29898 Other symptoms and signs involving the musculoskeletal system: Secondary | ICD-10-CM | POA: Diagnosis not present

## 2018-09-17 DIAGNOSIS — M8669 Other chronic osteomyelitis, multiple sites: Secondary | ICD-10-CM | POA: Diagnosis not present

## 2018-09-17 DIAGNOSIS — B3789 Other sites of candidiasis: Secondary | ICD-10-CM | POA: Diagnosis present

## 2018-09-17 DIAGNOSIS — E8729 Other acidosis: Secondary | ICD-10-CM | POA: Diagnosis present

## 2018-09-17 DIAGNOSIS — R1312 Dysphagia, oropharyngeal phase: Secondary | ICD-10-CM | POA: Diagnosis not present

## 2018-09-17 DIAGNOSIS — Z89422 Acquired absence of other left toe(s): Secondary | ICD-10-CM | POA: Diagnosis not present

## 2018-09-17 DIAGNOSIS — L97519 Non-pressure chronic ulcer of other part of right foot with unspecified severity: Secondary | ICD-10-CM | POA: Diagnosis not present

## 2018-09-17 DIAGNOSIS — Z792 Long term (current) use of antibiotics: Secondary | ICD-10-CM | POA: Diagnosis not present

## 2018-09-17 DIAGNOSIS — Z79899 Other long term (current) drug therapy: Secondary | ICD-10-CM

## 2018-09-17 DIAGNOSIS — I4891 Unspecified atrial fibrillation: Secondary | ICD-10-CM | POA: Diagnosis not present

## 2018-09-17 DIAGNOSIS — E872 Acidosis: Secondary | ICD-10-CM | POA: Diagnosis not present

## 2018-09-17 DIAGNOSIS — I130 Hypertensive heart and chronic kidney disease with heart failure and stage 1 through stage 4 chronic kidney disease, or unspecified chronic kidney disease: Secondary | ICD-10-CM | POA: Diagnosis present

## 2018-09-17 DIAGNOSIS — M86472 Chronic osteomyelitis with draining sinus, left ankle and foot: Secondary | ICD-10-CM | POA: Diagnosis not present

## 2018-09-17 LAB — COMPREHENSIVE METABOLIC PANEL
ALT: 10 U/L (ref 0–44)
AST: 17 U/L (ref 15–41)
Albumin: 2 g/dL — ABNORMAL LOW (ref 3.5–5.0)
Alkaline Phosphatase: 73 U/L (ref 38–126)
Anion gap: 17 — ABNORMAL HIGH (ref 5–15)
BILIRUBIN TOTAL: 0.5 mg/dL (ref 0.3–1.2)
BUN: 81 mg/dL — ABNORMAL HIGH (ref 8–23)
CALCIUM: 9.2 mg/dL (ref 8.9–10.3)
CHLORIDE: 118 mmol/L — AB (ref 98–111)
CO2: 18 mmol/L — AB (ref 22–32)
CREATININE: 4.52 mg/dL — AB (ref 0.61–1.24)
GFR calc Af Amer: 12 mL/min — ABNORMAL LOW (ref >60)
GFR calc non Af Amer: 10 mL/min — ABNORMAL LOW (ref >60)
GLUCOSE: 100 mg/dL — AB (ref 70–99)
Potassium: 3.2 mmol/L — ABNORMAL LOW (ref 3.5–5.1)
Sodium: 153 mmol/L — ABNORMAL HIGH (ref 135–145)
Total Protein: 7.3 g/dL (ref 6.5–8.1)

## 2018-09-17 LAB — CBC WITH DIFFERENTIAL/PLATELET
Abs Immature Granulocytes: 0.06 10*3/uL (ref 0.00–0.07)
Basophils Absolute: 0 10*3/uL (ref 0.0–0.1)
Basophils Relative: 0 %
EOS PCT: 0 %
Eosinophils Absolute: 0 10*3/uL (ref 0.0–0.5)
HCT: 37.1 % — ABNORMAL LOW (ref 39.0–52.0)
HEMOGLOBIN: 10.9 g/dL — AB (ref 13.0–17.0)
IMMATURE GRANULOCYTES: 1 %
LYMPHS ABS: 1.4 10*3/uL (ref 0.7–4.0)
LYMPHS PCT: 12 %
MCH: 26.2 pg (ref 26.0–34.0)
MCHC: 29.4 g/dL — ABNORMAL LOW (ref 30.0–36.0)
MCV: 89.2 fL (ref 80.0–100.0)
MONOS PCT: 8 %
Monocytes Absolute: 0.9 10*3/uL (ref 0.1–1.0)
NRBC: 0 % (ref 0.0–0.2)
Neutro Abs: 9.4 10*3/uL — ABNORMAL HIGH (ref 1.7–7.7)
Neutrophils Relative %: 79 %
Platelets: 256 10*3/uL (ref 150–400)
RBC: 4.16 MIL/uL — ABNORMAL LOW (ref 4.22–5.81)
RDW: 18 % — ABNORMAL HIGH (ref 11.5–15.5)
WBC: 11.8 10*3/uL — ABNORMAL HIGH (ref 4.0–10.5)

## 2018-09-17 LAB — I-STAT CG4 LACTIC ACID, ED
LACTIC ACID, VENOUS: 2.7 mmol/L — AB (ref 0.5–1.9)
Lactic Acid, Venous: 1.98 mmol/L — ABNORMAL HIGH (ref 0.5–1.9)

## 2018-09-17 MED ORDER — VANCOMYCIN VARIABLE DOSE PER UNSTABLE RENAL FUNCTION (PHARMACIST DOSING): Status: DC

## 2018-09-17 MED ORDER — POLYETHYLENE GLYCOL 3350 17 G PO PACK
17.0000 g | PACK | Freq: Every day | ORAL | Status: DC | PRN
  Filled 2018-09-17: qty 1

## 2018-09-17 MED ORDER — ALLOPURINOL 100 MG PO TABS
200.0000 mg | ORAL_TABLET | Freq: Every day | ORAL | Status: DC
  Administered 2018-09-19 – 2018-09-25 (×5): 200 mg via ORAL
  Filled 2018-09-17 (×7): qty 2

## 2018-09-17 MED ORDER — SILVER SULFADIAZINE 1 % EX CREA: TOPICAL_CREAM | CUTANEOUS | 1 refills | Status: AC

## 2018-09-17 MED ORDER — LACTATED RINGERS IV BOLUS
1000.0000 mL | Freq: Once | INTRAVENOUS | Status: AC
  Administered 2018-09-17: 1000 mL via INTRAVENOUS

## 2018-09-17 MED ORDER — FLUCONAZOLE IN SODIUM CHLORIDE 200-0.9 MG/100ML-% IV SOLN
200.0000 mg | INTRAVENOUS | Status: DC
  Administered 2018-09-18 – 2018-09-19 (×2): 200 mg via INTRAVENOUS
  Filled 2018-09-17 (×2): qty 100

## 2018-09-17 MED ORDER — FLUCONAZOLE IN SODIUM CHLORIDE 400-0.9 MG/200ML-% IV SOLN
400.0000 mg | Freq: Once | INTRAVENOUS | Status: AC
  Administered 2018-09-17: 400 mg via INTRAVENOUS
  Filled 2018-09-17: qty 200

## 2018-09-17 MED ORDER — VANCOMYCIN HCL 10 G IV SOLR
1500.0000 mg | Freq: Once | INTRAVENOUS | Status: AC
  Administered 2018-09-17: 1500 mg via INTRAVENOUS
  Filled 2018-09-17: qty 1500

## 2018-09-17 MED ORDER — DEXTROSE 5 % IV SOLN
500.0000 mg | INTRAVENOUS | Status: DC
  Administered 2018-09-18 – 2018-09-20 (×3): 500 mg via INTRAVENOUS
  Filled 2018-09-17 (×4): qty 0.5

## 2018-09-17 MED ORDER — POTASSIUM CHLORIDE 10 MEQ/100ML IV SOLN
10.0000 meq | INTRAVENOUS | Status: AC
  Administered 2018-09-17 (×3): 10 meq via INTRAVENOUS
  Filled 2018-09-17 (×3): qty 100

## 2018-09-17 MED ORDER — LACTATED RINGERS IV BOLUS
500.0000 mL | Freq: Once | INTRAVENOUS | Status: AC
  Administered 2018-09-17: 500 mL via INTRAVENOUS

## 2018-09-17 MED ORDER — LACTATED RINGERS IV BOLUS: 1000.0000 mL | Freq: Once | INTRAVENOUS | Status: DC

## 2018-09-17 MED ORDER — ACETAMINOPHEN 325 MG PO TABS
650.0000 mg | ORAL_TABLET | Freq: Four times a day (QID) | ORAL | Status: DC | PRN
  Filled 2018-09-17: qty 2

## 2018-09-17 MED ORDER — SODIUM CHLORIDE 0.9 % IV SOLN
2.0000 g | Freq: Once | INTRAVENOUS | Status: AC
  Administered 2018-09-17: 2 g via INTRAVENOUS
  Filled 2018-09-17: qty 2

## 2018-09-17 MED ORDER — HEPARIN SODIUM (PORCINE) 5000 UNIT/ML IJ SOLN
5000.0000 [IU] | Freq: Three times a day (TID) | INTRAMUSCULAR | Status: DC
  Administered 2018-09-17 – 2018-09-19 (×2): 5000 [IU] via SUBCUTANEOUS
  Filled 2018-09-17 (×9): qty 1

## 2018-09-17 MED ORDER — VANCOMYCIN HCL IN DEXTROSE 1-5 GM/200ML-% IV SOLN: 1000.0000 mg | Freq: Once | INTRAVENOUS | Status: DC

## 2018-09-17 MED ORDER — ADULT MULTIVITAMIN W/MINERALS CH
1.0000 | ORAL_TABLET | Freq: Every day | ORAL | Status: DC
  Administered 2018-09-20 – 2018-09-25 (×3): 1 via ORAL
  Filled 2018-09-17 (×7): qty 1

## 2018-09-17 MED ORDER — ACETAMINOPHEN 650 MG RE SUPP: 650.0000 mg | Freq: Four times a day (QID) | RECTAL | Status: DC | PRN

## 2018-09-17 MED ORDER — ASPIRIN EC 81 MG PO TBEC
81.0000 mg | DELAYED_RELEASE_TABLET | Freq: Every day | ORAL | Status: DC
  Administered 2018-09-19 – 2018-09-25 (×4): 81 mg via ORAL
  Filled 2018-09-17 (×7): qty 1

## 2018-09-17 NOTE — ED Provider Notes (Signed)
I saw and evaluated the patient, reviewed the resident's note and I agree with the findings and plan.  Pertinent History: Elderly 84 year old male, recently admitted for wound infection, presents to hospital today with hypotension generalized weakness  Pertinent Exam findings: Patient was initially severely hypotensive, IV fluids given by paramedics with improvement, on exam the patient is borderline tachycardic in a sinus tachycardia at 110 bpm, weak peripheral pulses, no edema but has purulent drainage from multiple wounds on bilateral feet where he has had multiple previous amputations of toes and partial foot amputations.  Oropharynx is mildly dehydrated  I was personally present and directly supervised the following procedures:  Medical resuscitation  .Critical Care Performed by: Eber Hong, MD Authorized by: Eber Hong, MD   Critical care provider statement:    Critical care time (minutes):  35   Critical care time was exclusive of:  Separately billable procedures and treating other patients and teaching time   Critical care was necessary to treat or prevent imminent or life-threatening deterioration of the following conditions:  Sepsis   Critical care was time spent personally by me on the following activities:  Blood draw for specimens, development of treatment plan with patient or surrogate, discussions with consultants, evaluation of patient's response to treatment, examination of patient, obtaining history from patient or surrogate, ordering and performing treatments and interventions, ordering and review of laboratory studies, ordering and review of radiographic studies, pulse oximetry, re-evaluation of patient's condition and review of old charts   I personally interpreted the EKG as well as the resident and agree with the interpretation on the resident's chart.  Final diagnoses:  Sepsis with acute renal failure without septic shock, due to unspecified organism, unspecified  acute renal failure type (HCC)  AKI (acute kidney injury) (HCC)      Eber Hong, MD 09/19/18 1702

## 2018-09-17 NOTE — ED Notes (Signed)
Pt and family refusing to have X RAYS of the feet done

## 2018-09-17 NOTE — Telephone Encounter (Signed)
Done. Thank you.

## 2018-09-17 NOTE — ED Notes (Signed)
Bladder scan showed 150 mL in bladder at this time

## 2018-09-17 NOTE — Progress Notes (Signed)
Pharmacy Antibiotic Note  Vincent Miranda is a 84 y.o. male admitted on 09/17/2018 with sepsis/osteomyelitis.  PMH is significant for chronic osteomyelitis and nonhealing lower extremity wounds, PVD, CKD stage III with acute on chronic AKI, HTN, Afib not on PTA anticoagulation.  PTA patient is taking doxycycline and cephalexin for chronic osteomyelitis. Pharmacy has been consulted for Vancomycin and Cefepime dosing. WBC - 11.8, afebrile, lactic acid - 1.98; Scr-4.52, elevated from baseline of around 2.5  Plan: Cefepime 2g IV x1, then Cefepime 500mg  IV q24h Vancomycin 1500mg  IV x1, then dose per level Fluconazole per MD Monitor and adjust per renal fx, clinical status, C&S, vanc troughs as needed  Weight: 160 lb 4.4 oz (72.7 kg)  Temp (24hrs), Avg:97.9 F (36.6 C), Min:97.9 F (36.6 C), Max:97.9 F (36.6 C)  Recent Labs  Lab 09/17/18 1723 09/17/18 1733 09/17/18 1941  WBC 11.8*  --   --   CREATININE 4.52*  --   --   LATICACIDVEN  --  2.70* 1.98*    Estimated Creatinine Clearance: 10.9 mL/min (A) (by C-G formula based on SCr of 4.52 mg/dL (H)).    No Known Allergies  Antimicrobials this admission: Vancomycin 10/29 >>  Cefepime 10/29 >>  Fluconazole 10/29 >>  Dose adjustments this admission: N/A  Microbiology results: Pending  Thank you for allowing pharmacy to be a part of this patient's care.  Koleen Nimrod 09/17/2018 9:09 PM

## 2018-09-17 NOTE — ED Provider Notes (Signed)
[facility] EMERGENCY DEPARTMENT Provider Note   CSN: [id number] Arrival date & time: 09/17/18  1654     History   Chief Complaint Chief Complaint  Patient presents with  . Altered Mental Status  . Dysphagia    HPI Vincent Miranda is a 84 y.o. male.  HPI Patient is a 84 year old male with a past medical history of anemia, A. fib, CKD, chronic osteomyelitis, and peripheral vascular disease who presents to the emergency department for evaluation of hypotension and reported altered mental status earlier today.  Patient was recently admitted to the hospital secondary to an acute on chronic wound infection of his bilateral feet.  Patient was given vancomycin and cefepime during his previous admission with improvement to his symptoms and was discharged home on Keflex and doxycycline as he did not want to go to a SNF for further IV antibiotics.  EMS was called out to patient's home earlier today secondary to blood pressures of 60s systolic and reported altered mental status.  He was given some IV fluids with EMS after which his blood pressure did improve.  They report he was also tachycardic in route.  Upon arrival patient is alert and oriented to person and place but not time.  He does complain of mild difficulty with swallowing but no other physical complaints at this time.  Of note, EMS reports that patient has been unable to take his p.o. antibiotics for the past several days secondary to difficulty swallowing.  Remaining review of systems as below.  Past Medical History:  Diagnosis Date  . Acute blood loss anemia 10/18/2013  . Anemia of chronic disease   . Atrial fibrillation (HCC)   . Chronic kidney disease   . Chronic osteomyelitis (HCC)   . Gout   . History of blood transfusion    "related to anemia" (07/16/2017)  . Hypertension   . PVD (peripheral vascular disease) [facility])     Patient Active Problem List   Diagnosis Date Noted  . Dysphagia 09/18/2018  .  Thrush 09/18/2018  . Hypomagnesemia 09/18/2018  . Hypernatremia 09/17/2018  . High anion gap metabolic acidosis 09/17/2018  . Malnutrition of moderate degree 08/27/2018  . Non-healing wound of lower extremity 08/26/2018  . Chronic skin ulcer with fat layer exposed (HCC) 07/11/2017  . AKI (acute kidney injury) (HCC) 04/18/2017  . Chronic osteomyelitis (HCC) 12/09/2016  . Rash and nonspecific skin eruption 09/08/2016  . Abnormal urinalysis 09/08/2016  . Paroxysmal atrial fibrillation (HCC) 03/01/2016  . Urinary retention 02/15/2016  . Gout 02/03/2016  . PVD (peripheral vascular disease) (HCC) 03/28/2015  . Anemia of chronic disease   . CKD (chronic kidney disease), stage III (HCC) 10/18/2013  . Essential hypertension   . Protein-calorie malnutrition, severe (HCC) 08/07/2013    Past Surgical History:  Procedure Laterality Date  . AMPUTATION Left 10/19/2013   Procedure: AMPUTATION RAY  LEFT GREAT TOE;  Surgeon: Nadara Mustard, MD;  Location: [facility] OR;  Service: Orthopedics;  Laterality: Left;  . EYE SURGERY Right    "had accident; had to replace lens in eye"        Home Medications    Prior to Admission medications   Medication Sig Start Date End Date Taking? Authorizing Provider  allopurinol (ZYLOPRIM) 100 MG tablet Take 2 tablets (200 mg total) by mouth daily. 05/15/18  Yes Gwynn Burly, DO  aspirin EC 81 MG tablet Take 81 mg by mouth daily.    Yes [provider]  cephALEXin (KEFLEX) 500  MG capsule Take 1 capsule (500 mg total) by mouth 2 (two) times daily. 08/30/18 10/11/18 Yes Masoudi, Elhamalsadat, MD  doxycycline (VIBRA-TABS) 100 MG tablet Take 1 tablet (100 mg total) by mouth 2 (two) times daily. 08/30/18 10/11/18 Yes Masoudi, Elhamalsadat, MD  furosemide (LASIX) 20 MG tablet TAKE 1 TABLET BY MOUTH ONCE DAILY AS NEEDED IN  ADDITION  TO  40  MG  TABLET  WHEN  THERE  IS  EXTRA  SWELLING Patient taking differently: Take 60 mg by mouth daily. TAKE 1 TABLET BY MOUTH  ONCE DAILY AS NEEDED IN  ADDITION  TO  40  MG  TABLET  WHEN  THERE  IS  EXTRA  SWELLING 09/01/18  Yes Masoudi, Elhamalsadat, MD  Multiple Vitamin (MULTIVITAMIN WITH MINERALS) TABS tablet Take 1 tablet by mouth daily. 08/31/18  Yes Masoudi, Elhamalsadat, MD  Potassium 95 MG TABS Take 1 tablet (95 mg total) by mouth daily. 09/01/18  Yes Masoudi, Elhamalsadat, MD  silver sulfADIAZINE (SSD) 1 % cream APPLY  CREAM EXTERNALLY TO AFFECTED AREA ONCE DAILY 09/17/18  Yes Tyson Alias, MD  Zinc 50 MG TABS Take 50 mg by mouth daily.   Yes [provider]  furosemide (LASIX) 40 MG tablet Take 1 tablet (40 mg total) by mouth daily. Patient not taking: Reported on 09/17/2018 09/01/18   MasoudiShawna Orleans, MD  nutrition supplement, JUVEN, (JUVEN) PACK Take 1 packet by mouth 2 (two) times daily between meals. Patient not taking: Reported on 09/17/2018 08/31/18   Chevis Pretty, MD    Family History History reviewed. No pertinent family history.  Social History Social History   Tobacco Use  . Smoking status: Former Smoker    Packs/day: 1.00    Years: 43.00    Pack years: 43.00    Types: Cigarettes  . Smokeless tobacco: Never Used  . Tobacco comment: "quit smoking cigarettes in the 1980s"  Substance Use Topics  . Alcohol use: Not Currently    Comment: "nothing anymore" (02/03/2016)  . Drug use: No     Allergies   Patient has no known allergies.   Review of Systems Review of Systems  Constitutional: Negative for chills and fever.  HENT: Positive for trouble swallowing. Negative for ear pain and sore throat.   Eyes: Negative for pain and visual disturbance.  Respiratory: Negative for cough and shortness of breath.   Cardiovascular: Negative for chest pain and palpitations.  Gastrointestinal: Negative for abdominal pain and vomiting.  Genitourinary: Negative for dysuria and hematuria.  Musculoskeletal: Negative for back pain and neck stiffness.  Skin: Positive for  wound. Negative for color change and rash.  Neurological: Negative for seizures and syncope.  Psychiatric/Behavioral: Negative for agitation.  All other systems reviewed and are negative.    Physical Exam Updated Vital Signs BP 112/75 (BP Location: Right Arm)   Pulse 94   Temp (!) 97.5 F (36.4 C) (Oral)   Resp (!) 27   Ht 5\' 11"  (1.803 m)   Wt 66 kg   SpO2 97%   BMI 20.29 kg/m   Physical Exam  Constitutional: He appears cachectic.  Appears dehydrated  HENT:  Head: Normocephalic and atraumatic.  Dry mucus membranes.   Eyes: Conjunctivae are normal.  Neck: Neck supple.  Cardiovascular: Regular rhythm. Tachycardia present.  Pulmonary/Chest: Effort normal. No respiratory distress.  Abdominal: Soft. There is no tenderness.  Musculoskeletal:  Patient with wounds to his bilateral feet.  Multiple amputations to his left foot.  There is a significant amount of  purulent drainage present on patient's bandaging after removing it for exam.  Significant edema present bilaterally.  There is some erythema present.  Neurological: He is alert.  Skin: Skin is warm and dry.  Psychiatric: He has a normal mood and affect.  Nursing note and vitals reviewed.    ED Treatments / Results  Labs (all labs ordered are listed, but only abnormal results are displayed) Labs Reviewed  MRSA PCR SCREENING - Abnormal; Notable for the following components:      Result Value   MRSA by PCR POSITIVE (*)    All other components within normal limits  COMPREHENSIVE METABOLIC PANEL - Abnormal; Notable for the following components:   Sodium 153 (*)    Potassium 3.2 (*)    Chloride 118 (*)    CO2 18 (*)    Glucose, Bld 100 (*)    BUN 81 (*)    Creatinine, Ser 4.52 (*)    Albumin 2.0 (*)    GFR calc non Af Amer 10 (*)    GFR calc Af Amer 12 (*)    Anion gap 17 (*)    All other components within normal limits  CBC WITH DIFFERENTIAL/PLATELET - Abnormal; Notable for the following components:   WBC 11.8  (*)    RBC 4.16 (*)    Hemoglobin 10.9 (*)    HCT 37.1 (*)    MCHC 29.4 (*)    RDW 18.0 (*)    Neutro Abs 9.4 (*)    All other components within normal limits  MAGNESIUM - Abnormal; Notable for the following components:   Magnesium 1.1 (*)    All other components within normal limits  BASIC METABOLIC PANEL - Abnormal; Notable for the following components:   Sodium 151 (*)    Potassium 3.0 (*)    Chloride 123 (*)    CO2 21 (*)    BUN 73 (*)    Creatinine, Ser 3.82 (*)    Calcium 7.5 (*)    GFR calc non Af Amer 13 (*)    GFR calc Af Amer 15 (*)    All other components within normal limits  RENAL FUNCTION PANEL - Abnormal; Notable for the following components:   Sodium 148 (*)    Chloride 120 (*)    CO2 19 (*)    BUN 84 (*)    Creatinine, Ser 3.90 (*)    Calcium 8.4 (*)    Albumin 1.4 (*)    GFR calc non Af Amer 12 (*)    GFR calc Af Amer 14 (*)    All other components within normal limits  CBC - Abnormal; Notable for the following components:   WBC 11.9 (*)    RBC 3.19 (*)    Hemoglobin 8.2 (*)    HCT 28.8 (*)    MCH 25.7 (*)    MCHC 28.5 (*)    RDW 17.5 (*)    All other components within normal limits  I-STAT CG4 LACTIC ACID, ED - Abnormal; Notable for the following components:   Lactic Acid, Venous 2.70 (*)    All other components within normal limits  I-STAT CG4 LACTIC ACID, ED - Abnormal; Notable for the following components:   Lactic Acid, Venous 1.98 (*)    All other components within normal limits  CULTURE, BLOOD (ROUTINE X 2)  CULTURE, BLOOD (ROUTINE X 2)  MAGNESIUM  VANCOMYCIN, RANDOM    EKG EKG Interpretation  Date/Time:  Tuesday September 17 2018 17:50:31 EDT Ventricular Rate:  106 PR Interval:    QRS Duration: 136 QT Interval:  396 QTC Calculation: 526 R Axis:   -80 Text Interpretation:  Sinus tachycardia Atrial premature complex Right bundle branch block Probable inferior infarct, old Artifact in lead(s) I III aVR aVL aVF V1 V2 V4 V6 No  significant change since last tracing Confirmed by Gwyneth Sprout ([id number]) on 09/18/2018 8:56:55 PM   Radiology Dg Chest Port 1 View  Result Date: 09/17/2018 CLINICAL DATA:  Difficulty swallowing EXAM: PORTABLE CHEST 1 VIEW COMPARISON:  08/30/2018 FINDINGS: Top-normal heart size with tortuous atherosclerotic aorta. No pulmonary consolidation. Clearing of right basilar atelectasis. No effusion or pneumothorax. No radiopaque foreign body. Osteoarthritis of the included AC and glenohumeral joints. IMPRESSION: Tortuous atherosclerotic aorta with borderline cardiomegaly. No active pulmonary disease. Electronically Signed   By: Tollie Eth M.D.   On: 09/17/2018 17:47   Dg Foot 2 Views Left  Result Date: 09/17/2018 CLINICAL DATA:  Evaluate for osteomyelitis. EXAM: LEFT FOOT - 2 VIEW COMPARISON:  07/11/2017 FINDINGS: Examination demonstrates evidence of patient's previous amputations distal to the base of the first metatarsal as well as distal to the head of the second metatarsal. Hammertoe deformity of the third toe. Increased lucency with possible bone destruction involving the fifth interphalangeal joint as this may be due to osteomyelitis. No air within the soft tissues. Remainder of the exam is unchanged. IMPRESSION: Postsurgical change compatible previous first and second toe amputations as described. Possible bone destruction involving the fifth interphalangeal joint which could due to osteomyelitis. Electronically Signed   By: Elberta Fortis M.D.   On: 09/17/2018 22:21   Dg Foot 2 Views Right  Result Date: 09/17/2018 CLINICAL DATA:  Assess for osteomyelitis. EXAM: RIGHT FOOT - 2 VIEW COMPARISON:  07/11/2017 FINDINGS: Evidence patient's amputation distal to the fifth mid metatarsal. Moderate degenerate change of the first MTP joint. Hammertoe deformity of the fourth toe. No bone destruction to suggest osteomyelitis. No air within the soft tissues. Remainder of the exam is unchanged. IMPRESSION: No  evidence of osteomyelitis. Amputation distal to the mid fifth metatarsal. Electronically Signed   By: Elberta Fortis M.D.   On: 09/17/2018 22:22    Procedures Procedures (including critical care time)  Medications Ordered in ED Medications  allopurinol (ZYLOPRIM) tablet 200 mg (200 mg Oral Not Given 09/18/18 1021)  aspirin EC tablet 81 mg (81 mg Oral Not Given 09/18/18 1021)  multivitamin with minerals tablet 1 tablet (1 tablet Oral Not Given 09/18/18 1021)  heparin injection 5,000 Units (5,000 Units Subcutaneous Not Given 09/18/18 2147)  acetaminophen (TYLENOL) tablet 650 mg (has no administration in time range)    Or  acetaminophen (TYLENOL) suppository 650 mg (has no administration in time range)  polyethylene glycol (MIRALAX / GLYCOLAX) packet 17 g (has no administration in time range)  fluconazole (DIFLUCAN) IVPB 400 mg (0 mg Intravenous Stopped 09/18/18 0052)    Followed by  fluconazole (DIFLUCAN) IVPB 200 mg (200 mg Intravenous New Bag/Given 09/18/18 2159)  ceFEPIme (MAXIPIME) 500 mg in dextrose 5 % 50 mL IVPB (500 mg Intravenous New Bag/Given 09/18/18 1731)  vancomycin variable dose per unstable renal function (pharmacist dosing) (has no administration in time range)  dextrose 5 % solution ( Intravenous New Bag/Given 09/18/18 1323)  potassium chloride 10 mEq in 100 mL IVPB ( Intravenous Canceled Entry 09/18/18 0412)  mupirocin ointment (BACTROBAN) 2 % 1 application (1 application Nasal Given 09/18/18 2146)  Chlorhexidine Gluconate Cloth 2 % PADS 6 each (6 each Topical Given 09/18/18 0556)  lactated ringers bolus 1,000 mL (0 mLs Intravenous Stopped 09/17/18 1848)  ceFEPIme (MAXIPIME) 2 g in sodium chloride 0.9 % 100 mL IVPB (0 g Intravenous Stopped 09/17/18 1816)  vancomycin (VANCOCIN) 1,500 mg in sodium chloride 0.9 % 500 mL IVPB (0 mg Intravenous Stopped 09/17/18 1940)  potassium chloride 10 mEq in 100 mL IVPB (0 mEq Intravenous Stopped 09/18/18 0012)  lactated ringers bolus 500  mL (0 mLs Intravenous Stopped 09/17/18 2050)  magnesium sulfate IVPB 4 g 100 mL (4 g Intravenous New Bag/Given 09/18/18 0228)     Initial Impression / Assessment and Plan / ED Course  I have reviewed the triage vital signs and the nursing notes.  Pertinent labs & imaging results that were available during my care of the patient were reviewed by me and considered in my medical decision making (see chart for details).     Patient is a 84 year old male with past medical history as detailed above who presents to the emergency department for evaluation of altered mental status and dysphasia.  Upon arrival patient is alert and oriented x4.  He is able to maintain his own airway.  It was also reported that patient was significantly hypotensive at his home, however after mild fluid resuscitation with EMS his blood pressure has significantly improved.  Secondary to patient's arrival complaint laboratory and imaging studies were obtained.  Patient's labs were as detailed above and are concerning for possible early sepsis, AKI, and lactic acidosis.  Patient does have possible changes consistent with osteomyelitis on his left foot x-ray.  Secondary to concern for early sepsis patient was made code sepsis and given IV fluid resuscitation and IV antibiotics.  Blood cultures were drawn prior to initiation of IV antibiotics.  Patient's AKI also treated with IV fluids.  This also improved his lactic acidosis.  Patient was able to maintain adequate blood pressures were under my care.  Given patient's laboratory imaging findings, the internal medicine service was consulted for admission and the patient was accepted to their service for further evaluation and care.  Patient with no further acute events while under my care.  For further information regarding patient's continued hospital stay please see admitting team's documentation.  The care of this patient was discussed with my attending physician Dr. Hyacinth Meeker, who  voices agreement with work-up and ED disposition.  Final Clinical Impressions(s) / ED Diagnoses   Final diagnoses:  Sepsis with acute renal failure without septic shock, due to unspecified organism, unspecified acute renal failure type [facility])  AKI (acute kidney injury) [facility])    ED Discharge Orders    None       Karlene Southard, Winfield Rast, MD 09/18/18 2222    Eber Hong, MD 09/19/18 [phone]

## 2018-09-17 NOTE — H&P (Addendum)
Date: 09/17/2018               Patient Name:  Vincent Miranda MRN: [id number]  DOB: 28-Jan-1925 Age / Sex: 84 y.o., male   PCP: Ali Lowe, MD         Medical Service: Internal Medicine Teaching Service         Attending Physician: Dr. Gust Rung, DO    First Contact: Dr. Teola Bradley Pager: [phone]  Second Contact: Dr. Levora Dredge Pager: [phone]       After Hours (After 5p/  First Contact Pager: [phone]  weekends / holidays): Second Contact Pager: [phone]   Chief Complaint: Dysphagia  History of Present Illness: Vincent Miranda is a 84 yo M w/ PMH of chronic lower extremity ulcers, a.fib, CKD3, chronic osteomyelitis and PVD. He was examined and evaluated in the ED with family present. Vincent Miranda stated he was not able to recall the presentation of his symptoms but he states he has been having gradual onset difficulty swallowing with left sided tightness about a week ago. He states he feels like his throat is closing up and he ends up coughing up his intake along with some whitish sputum. His wife provides additional history, stating he was doing well for about a week after discharge then started having more dysphagia which seemed to be progressive and in the last few days she states he has barely taken anything by mouth; Along with this poor intake, he had has worsening weakness and increased somnolence. Due to his dysphagia he had been unable to regularly take his oral medications including his potassium pills. His wife states the last time he had his full dose of antibiotics was a week ago. He denies any fevers, chills, chest pain, abdominal pain, nausea, vomiting, or significant shortness of breath. Denies any worsening of his bilateral foot pain, drainage or smell.   She mentions that he has had prior episodes of dysphagia similar to this episode going back year. She states usually these episodes are intermittent and given couple of days, he is able to recover  his swallowing function and his issue is resolved but this episode appears different in how long it has been going on.  He had a recent hospitalization (10/11~10/13) for acute on chronic wound infection of his foot when he was treated with vancomycin and cefepime with improvement and he was discharged with po doxycyline and keflex for 6 weeks. During the admission he was offered amputation but he refused. He was also offered SNF placement with IV antibiotics but patient preferred discharge to home on oral antibiotics.  ED Course: In the ED, he was found to have purulent drainage from his foot ulcers with leukocytosis. Blood cultures were collected and he was started on vanc and cefepime. He was also found to have elevated lactate with AKI of creatine 4.52 and given 1.5L LR bolus. X-ray of his feet were ordered.  Meds:  Current Meds  Medication Sig  . allopurinol (ZYLOPRIM) 100 MG tablet Take 2 tablets (200 mg total) by mouth daily.  Marland Kitchen aspirin EC 81 MG tablet Take 81 mg by mouth daily.   . cephALEXin (KEFLEX) 500 MG capsule Take 1 capsule (500 mg total) by mouth 2 (two) times daily.  Marland Kitchen doxycycline (VIBRA-TABS) 100 MG tablet Take 1 tablet (100 mg total) by mouth 2 (two) times daily.  . furosemide (LASIX) 20 MG tablet TAKE 1 TABLET BY MOUTH ONCE DAILY AS NEEDED IN  ADDITION  TO  40  MG  TABLET  WHEN  THERE  IS  EXTRA  SWELLING (Patient taking differently: Take 60 mg by mouth daily. TAKE 1 TABLET BY MOUTH ONCE DAILY AS NEEDED IN  ADDITION  TO  40  MG  TABLET  WHEN  THERE  IS  EXTRA  SWELLING)  . Multiple Vitamin (MULTIVITAMIN WITH MINERALS) TABS tablet Take 1 tablet by mouth daily.  . Potassium 95 MG TABS Take 1 tablet (95 mg total) by mouth daily.  . silver sulfADIAZINE (SSD) 1 % cream APPLY  CREAM EXTERNALLY TO AFFECTED AREA ONCE DAILY  . Zinc 50 MG TABS Take 50 mg by mouth daily.    Allergies: Allergies as of 09/17/2018  . (No Known Allergies)   Past Medical History:  Diagnosis Date  . Acute  blood loss anemia 10/18/2013  . Anemia of chronic disease   . Atrial fibrillation (HCC)   . Chronic kidney disease   . Chronic osteomyelitis (HCC)   . Gout   . History of blood transfusion    "related to anemia" (07/16/2017)  . Hypertension   . PVD (peripheral vascular disease) (HCC)    Family History:  No pertinent family history  Social History: Lives with wife; former cigarette smoker (43 pack year history); denies EtOH or illicit drug use.  Review of Systems: No headache, blurry vision, numbness, tingling, diarrhea, constipation. A complete ROS was negative except as per HPI.   Physical Exam: Blood pressure 110/70, pulse (!) 107, temperature 97.9 F (36.6 C), temperature source Oral, resp. rate 19, SpO2 100 %. Physical Exam  Constitutional: He is oriented to person, place, and time. No distress.  Cachetic appearing, somnolent elderly male  HENT:  Head: Atraumatic.  Mouth/Throat: Oropharynx is clear and moist. No oropharyngeal exudate.  + thrush on tongue  Eyes: Pupils are equal, round, and reactive to light. Conjunctivae and EOM are normal. No scleral icterus.  Neck: Neck supple. No JVD present. No tracheal deviation present. No thyromegaly present.  ROM limited due to weakness  Cardiovascular: Normal rate, regular rhythm and intact distal pulses.  No murmur heard. Pulmonary/Chest: Effort normal and breath sounds normal. No stridor. He has no wheezes. He has no rales.  Abdominal: Soft. Bowel sounds are normal. He exhibits no distension. There is no tenderness.  Musculoskeletal: Normal range of motion. He exhibits tenderness (biliateral chronic foot ulcers with purulent drainage with surrounding warmth and edema. ) and deformity (See pictures below).  Lymphadenopathy:    He has no cervical adenopathy.  Neurological: He is oriented to person, place, and time. No cranial nerve deficit.  somnolent  Skin: Skin is warm and dry. He is not diaphoretic.  Bilateral foot ulcers  pictured below               EKG: personally reviewed my interpretation is sinus tachycardia, significant artifacts, +PACs, right bundle branch block.  CXR: personally reviewed my interpretation is wide mediastinum, no lobar consolidation, no vascular congestion, no pleural effusion, flattened diaphragm.  Assessment & Plan by Problem: Mr.Casamento is a 84 yo M w/ PMH of CKD3, chronic osteomyelitis in bilateral feet, HTN, and PVD presenting with dysphagia. He appears to have an acute exacerbation of chronic intermittent dysphagic episodes. This appears to have been an ongoing issue w/o history of prior work up. During his last hospitalization he declined speech and swallow evaluation x3. Dysphagia appear oropharyngeal based on presentation. However he is at risk for medication-induced esophagitis base on his current medlist (doxycycline, potassium, aspirin) Also,  evidence of thrush on his tongue is present which may appoint to infectious esophagitis. Lastly, considering intermittent presentation, he may have a neuromuscular motility disorder causing his dysphagia. He also developed AKI and lactic acidosis most likely due to dehydration from poor oral intake in setting of known osteomyelitis. Will admit for IV abx and fluid resuscitation with further work up for dysphagia.  Oropharyngeal Dysphagia 2/2 functional dysphagia vs esophagitis vs motility dysfunction Prior hx of intermittent episodes of dysphagia. Unable to tolerate solids and liquis. No prior history of GERD. HIV negative. No other focal neurological deficits.  - Start fluconazole 400mg  IV then 200mg  IV daily - Speech and swallow therapy and eval in AM - If inconclusive, may need GI consult for upper endoscopy  AKI on CKD stage 3 with 'chronic' hypernatremia 2/2 dehydration 2/2 poor oral intake On admit Na 153, Baseline creatinine 1.5. Received 1.5L LR in ED. Repeat BMP show Na 151.  Calculated free water deficit 1.4L. Creatine  4.52->3.82 - Start D5 100cc/hr - Trend BMP - Goal is 10-12 mEq Na change in 24hrs until at baseline. Avoid overcorrection  Chronic bil foot osteomyelitis Appears similar to prior presentation. Lactate on admit 2.7. Completed 2/6 weeks of abx treatment. Was on keflex and doxy at home. Started on vanc and cefepime in ED. - Lactate downtrending (repeat 1.98 after 1L bolus) - F/u Blood cultures - F/u X-rays of both feet - C/w vancomycin and cefepime per pharmacy - Wound care consult in AM for dressing changes  Hypokalemia K of 3.2 on admission; has not been able to tolerate taking PO potassium at home. -f/u Mag level -replete with KCl x3 -f/u AM renal panel  Hx of dCHF  Last Echo 60-65% with garde 1 diastolic dysfunction. On furosemide 20-40mg  at home - Hold home Lasix - C/w home med: aspirin 81mg  daily   Hx of Gout - c/w home meds: allopurinol 200mg  daily  DVT prophx: heparin sq Diet: NPO Bowel: Miralax Code: Full  Dispo: Admit patient to Inpatient with expected length of stay greater than 2 midnights.  Signed: Theotis Barrio, MD 09/17/2018, 8:32 PM  Pager: [phone]

## 2018-09-17 NOTE — Telephone Encounter (Signed)
Pt's wife called and states RX for silver sulfadiazine 1% cream was called in for 25 gm tube and pharmacy does not have this in stock because it is such a small amount.  Wife states she always gets this RX in a 400gm tube and is requesting a 400gm tube/jar.  Pt is completely out and dressing change is due today.  SChaplin, RN

## 2018-09-17 NOTE — ED Triage Notes (Signed)
Pt here by EMS after family called due to patient being more altered than normal.  Was treated for infections to bilateral feet and discharged on oral ABX.  Initial 60s systolic for EMS, given 300 cc fluids.  A&Ox2 disoriented to date and situation.

## 2018-09-18 ENCOUNTER — Encounter (HOSPITAL_COMMUNITY): Payer: Self-pay

## 2018-09-18 ENCOUNTER — Other Ambulatory Visit: Payer: Self-pay

## 2018-09-18 DIAGNOSIS — M86472 Chronic osteomyelitis with draining sinus, left ankle and foot: Secondary | ICD-10-CM

## 2018-09-18 DIAGNOSIS — Z792 Long term (current) use of antibiotics: Secondary | ICD-10-CM

## 2018-09-18 DIAGNOSIS — B370 Candidal stomatitis: Secondary | ICD-10-CM

## 2018-09-18 DIAGNOSIS — I5030 Unspecified diastolic (congestive) heart failure: Secondary | ICD-10-CM

## 2018-09-18 DIAGNOSIS — E870 Hyperosmolality and hypernatremia: Secondary | ICD-10-CM

## 2018-09-18 DIAGNOSIS — R1312 Dysphagia, oropharyngeal phase: Secondary | ICD-10-CM

## 2018-09-18 DIAGNOSIS — M86471 Chronic osteomyelitis with draining sinus, right ankle and foot: Secondary | ICD-10-CM

## 2018-09-18 DIAGNOSIS — M109 Gout, unspecified: Secondary | ICD-10-CM

## 2018-09-18 DIAGNOSIS — L97519 Non-pressure chronic ulcer of other part of right foot with unspecified severity: Secondary | ICD-10-CM

## 2018-09-18 DIAGNOSIS — Z7982 Long term (current) use of aspirin: Secondary | ICD-10-CM

## 2018-09-18 DIAGNOSIS — N179 Acute kidney failure, unspecified: Principal | ICD-10-CM

## 2018-09-18 DIAGNOSIS — E860 Dehydration: Secondary | ICD-10-CM

## 2018-09-18 DIAGNOSIS — R1310 Dysphagia, unspecified: Secondary | ICD-10-CM

## 2018-09-18 DIAGNOSIS — I739 Peripheral vascular disease, unspecified: Secondary | ICD-10-CM

## 2018-09-18 DIAGNOSIS — L97529 Non-pressure chronic ulcer of other part of left foot with unspecified severity: Secondary | ICD-10-CM

## 2018-09-18 DIAGNOSIS — I4891 Unspecified atrial fibrillation: Secondary | ICD-10-CM

## 2018-09-18 DIAGNOSIS — R64 Cachexia: Secondary | ICD-10-CM

## 2018-09-18 DIAGNOSIS — Z79899 Other long term (current) drug therapy: Secondary | ICD-10-CM

## 2018-09-18 LAB — MAGNESIUM
MAGNESIUM: 1.1 mg/dL — AB (ref 1.7–2.4)
MAGNESIUM: 2.4 mg/dL (ref 1.7–2.4)

## 2018-09-18 LAB — CBC
HCT: 28.8 % — ABNORMAL LOW (ref 39.0–52.0)
HEMOGLOBIN: 8.2 g/dL — AB (ref 13.0–17.0)
MCH: 25.7 pg — ABNORMAL LOW (ref 26.0–34.0)
MCHC: 28.5 g/dL — ABNORMAL LOW (ref 30.0–36.0)
MCV: 90.3 fL (ref 80.0–100.0)
NRBC: 0 % (ref 0.0–0.2)
PLATELETS: 182 10*3/uL (ref 150–400)
RBC: 3.19 MIL/uL — AB (ref 4.22–5.81)
RDW: 17.5 % — ABNORMAL HIGH (ref 11.5–15.5)
WBC: 11.9 10*3/uL — ABNORMAL HIGH (ref 4.0–10.5)

## 2018-09-18 LAB — RENAL FUNCTION PANEL
ANION GAP: 9 (ref 5–15)
Albumin: 1.4 g/dL — ABNORMAL LOW (ref 3.5–5.0)
BUN: 84 mg/dL — ABNORMAL HIGH (ref 8–23)
CO2: 19 mmol/L — AB (ref 22–32)
Calcium: 8.4 mg/dL — ABNORMAL LOW (ref 8.9–10.3)
Chloride: 120 mmol/L — ABNORMAL HIGH (ref 98–111)
Creatinine, Ser: 3.9 mg/dL — ABNORMAL HIGH (ref 0.61–1.24)
GFR calc non Af Amer: 12 mL/min — ABNORMAL LOW (ref >60)
GFR, EST AFRICAN AMERICAN: 14 mL/min — AB (ref >60)
GLUCOSE: 96 mg/dL (ref 70–99)
PHOSPHORUS: 3.7 mg/dL (ref 2.5–4.6)
POTASSIUM: 3.5 mmol/L (ref 3.5–5.1)
Sodium: 148 mmol/L — ABNORMAL HIGH (ref 135–145)

## 2018-09-18 LAB — BASIC METABOLIC PANEL
Anion gap: 7 (ref 5–15)
BUN: 73 mg/dL — ABNORMAL HIGH (ref 8–23)
CALCIUM: 7.5 mg/dL — AB (ref 8.9–10.3)
CHLORIDE: 123 mmol/L — AB (ref 98–111)
CO2: 21 mmol/L — ABNORMAL LOW (ref 22–32)
CREATININE: 3.82 mg/dL — AB (ref 0.61–1.24)
GFR calc non Af Amer: 13 mL/min — ABNORMAL LOW (ref >60)
GFR, EST AFRICAN AMERICAN: 15 mL/min — AB (ref >60)
GLUCOSE: 87 mg/dL (ref 70–99)
Potassium: 3 mmol/L — ABNORMAL LOW (ref 3.5–5.1)
Sodium: 151 mmol/L — ABNORMAL HIGH (ref 135–145)

## 2018-09-18 LAB — MRSA PCR SCREENING: MRSA by PCR: POSITIVE — AB

## 2018-09-18 MED ORDER — DEXTROSE 5 % IV SOLN
INTRAVENOUS | Status: DC
  Administered 2018-09-18 (×2): via INTRAVENOUS

## 2018-09-18 MED ORDER — CHLORHEXIDINE GLUCONATE CLOTH 2 % EX PADS
6.0000 | MEDICATED_PAD | Freq: Every day | CUTANEOUS | Status: AC
  Administered 2018-09-18 – 2018-09-22 (×5): 6 via TOPICAL

## 2018-09-18 MED ORDER — MUPIROCIN 2 % EX OINT
1.0000 "application " | TOPICAL_OINTMENT | Freq: Two times a day (BID) | CUTANEOUS | Status: AC
  Administered 2018-09-18 – 2018-09-22 (×10): 1 via NASAL
  Filled 2018-09-18 (×3): qty 22

## 2018-09-18 MED ORDER — MAGNESIUM SULFATE 4 GM/100ML IV SOLN
4.0000 g | Freq: Once | INTRAVENOUS | Status: AC
  Administered 2018-09-18: 4 g via INTRAVENOUS
  Filled 2018-09-18: qty 100

## 2018-09-18 MED ORDER — POTASSIUM CHLORIDE 10 MEQ/100ML IV SOLN
10.0000 meq | INTRAVENOUS | Status: AC
  Administered 2018-09-18 (×3): 10 meq via INTRAVENOUS
  Filled 2018-09-18: qty 100

## 2018-09-18 NOTE — Progress Notes (Signed)
Subjective: Vincent Miranda is doing well. He says he is better than arrival. He denies any pain. No other acute complaint. Wife is not in the room during morning round. Objective:  Vital signs in last 24 hours: Vitals:   09/18/18 0115 09/18/18 0217 09/18/18 0318 09/18/18 0555  BP: 109/75 113/75 102/72 111/69  Pulse:  (!) 101 97 97  Resp: (!) 25 20 19 20   Temp:  98 F (36.7 C)    TempSrc:  Oral    SpO2: 96% 100% 100% 100%  Weight:  66 kg  66 kg  Height:  5\' 11"  (1.803 m)     Physical exam:  Pleasant gentle man.   Physical Exam  Constitutional: He appears dehydrated. He appears cachectic.  Non-toxic appearance.  HEENT: Mouth/throat exam: small localized Thrush on the right lateral side of tongue, soft palate and uvula Cardiovascular: Normal rate, regular rhythm, normal heart sounds and intact distal pulses.  No murmur heard. Pulmonary/Chest: Effort normal and breath sounds normal. No respiratory distress. He has no wheezes. He has no rales.  Abdominal: Soft. Bowel sounds are normal. He exhibits no distension. There is no tenderness. There is no rebound.  Musculoskeletal: Has bilateral multiple chronic ulcers.  Some with minimal purulent drainage and redness. he exhibits no pitting edema. Has a long unclipped fingernails.  Neurological: He is alert.  Psychiatric: Mood and affect normal.  Physical Exam  Assessment/Plan: Principal Problem:   AKI (acute kidney injury) (HCC) Active Problems:   Chronic osteomyelitis (HCC)   Hypernatremia   High anion gap metabolic acidosis  Vincent Miranda is a 84 yo M w/ PMH of chronic lower extremity osteomyelitis and ulcers, a.fib, CKD3, PVD, presented with poor PO intake due to difficulty swallowing and also found to be hypotensive and had AKI on arrival.  Oropharyngeal sold dysphagia 2/2 functional dysphagia vs oropharyngeal candidiasis 2/2 long term antibiotic use vs esophagitis (due to doxycycline) vs motility dysfunction  HIV  negative On exam: Has small localized thrush on  His tongue  No neurologic deficit  -C/w Fluconazole 200 mg IV QD (after finishing starting dose of 400 mg IV once) -Speech and swallow eval and therapy  AKI on CKD stage 3: Creatinine 4.52 on arrival . Baseline 1.5 Prerenal, 2/2 dehydration due to poor oral intake. Cr. Improved to 3.9 S/P 1.5L LR  -c/w D5 137ml/h Hypernatremia 2/2 dehydration due to poor oral intake: Na 153 on arrival. Now 148. Free water deficit: 2.4 li  -C/w D5 11ml/h    Chronic bilateral osteomyelitis of feet: Appears similar to prior presentation. Lactate on admit 2.7. Completed 2/6 weeks of keflex and doxy at home. Started on vanc and cefepime in ED. Lactate downtrending (repeat 1.98 after 1L bolus)  - F/u Blood cultures - F/u X-rays of both feet - C/w vancomycin and cefepime per pharmacy - Wound care consult in AM for dressing changes  Hypokalemia: resolved BMP Latest Ref Rng & Units 09/18/2018 09/17/2018 09/17/2018  Glucose 70 - 99 mg/dL 96 87 161(W)  BUN 8 - 23 mg/dL 96(E) 45(W) 09(W)  Creatinine 0.61 - 1.24 mg/dL 1.19(J) 4.78(G) 9.56(O)  BUN/Creat Ratio 10 - 24 - - -  Sodium 135 - 145 mmol/L 148(H) 151(H) 153(H)  Potassium 3.5 - 5.1 mmol/L 3.5 3.0(L) 3.2(L)  Chloride 98 - 111 mmol/L 120(H) 123(H) 118(H)  CO2 22 - 32 mmol/L 19(L) 21(L) 18(L)  Calcium 8.9 - 10.3 mg/dL 1.3(Y) 7.5(L) 9.2   -Repeat Mag level  Hx of dCHF  Last Echo 60-65% with  garde 1 diastolic dysfunction. On furosemide 20-40mg  at home - Hold home Lasix - C/w home med: aspirin 81mg  daily   Hx of Gout - c/w home meds: allopurinol 200mg  daily  Dispo: Anticipated discharge in approximately 2-3 days  Chevis Pretty, MD 09/18/2018, 6:18 AM Pager: [phone]

## 2018-09-18 NOTE — Evaluation (Signed)
Clinical/Bedside Swallow Evaluation Patient Details  Name: Vincent Miranda MRN: [id number] Date of Birth: Dec 03, 1924  Today's Date: 09/18/2018 Time: SLP Start Time (ACUTE ONLY): 1036 SLP Stop Time (ACUTE ONLY): 1102 SLP Time Calculation (min) (ACUTE ONLY): 26 min  Past Medical History:  Past Medical History:  Diagnosis Date  . Acute blood loss anemia 10/18/2013  . Anemia of chronic disease   . Atrial fibrillation (HCC)   . Chronic kidney disease   . Chronic osteomyelitis (HCC)   . Gout   . History of blood transfusion    "related to anemia" (07/16/2017)  . Hypertension   . PVD (peripheral vascular disease) (HCC)    Past Surgical History:  Past Surgical History:  Procedure Laterality Date  . AMPUTATION Left 10/19/2013   Procedure: AMPUTATION RAY  LEFT GREAT TOE;  Surgeon: Nadara Mustard, MD;  Location: [facility] OR;  Service: Orthopedics;  Laterality: Left;  . EYE SURGERY Right    "had accident; had to replace lens in eye"   HPI:      Assessment / Plan / Recommendation Clinical Impression  Patient presents with a suspected primary esophageal dysphagia characterized by c/o globus during po intake without evidence of oropharyngeal dysphagia. Oral and pharyngeal functional appear largely normal with swift oral transit of bolus (loose fitting dentures) and no overt indication of aspiration. Patient with white coating on tongue as well as uvula, likely in pharynx and possibly in esophagus contributing to difficulty. Patient has episodes of dysphagia which have resolved on their own in the past however, raising suspicion for additional esophageal components as well ( ? GERD, presbyesophagus, etc). Recommend soft solids due to loose fitting dentures and possible decreased esophageal motility, thin liquids, reflux precautions. SLP will f/u briefly for tolerance and additional needs (diflucan starting tonight).  SLP Visit Diagnosis: Dysphagia, unspecified (R13.10)    Aspiration Risk  Risk for  inadequate nutrition/hydration;Mild aspiration risk    Diet Recommendation Dysphagia 3 (Mech soft);Thin liquid   Liquid Administration via: Cup;Straw Medication Administration: Whole meds with liquid(crush if needed) Supervision: Patient able to self feed;Intermittent supervision to cue for compensatory strategies Compensations: Slow rate;Small sips/bites;Follow solids with liquid Postural Changes: Seated upright at 90 degrees;Remain upright for at least 30 minutes after po intake    Other  Recommendations Recommended Consults: Consider esophageal assessment;Consider GI evaluation Oral Care Recommendations: Oral care BID   Follow up Recommendations None      Frequency and Duration min 2x/week  1 week       Prognosis Prognosis for Safe Diet Advancement: Fair      Swallow Study   General Type of Study: Bedside Swallow Evaluation Previous Swallow Assessment: none Diet Prior to this Study: Thin liquids;Dysphagia 1 (puree) Temperature Spikes Noted: No Respiratory Status: Room air History of Recent Intubation: No Behavior/Cognition: Alert;Cooperative;Pleasant mood Oral Cavity Assessment: Other (comment)(white coating on tongue and along uvula, likely thrush) Oral Care Completed by SLP: Recent completion by staff Oral Cavity - Dentition: Dentures, top;Dentures, bottom Vision: Functional for self-feeding Self-Feeding Abilities: Able to feed self Patient Positioning: Upright in bed Baseline Vocal Quality: Normal Volitional Cough: Strong Volitional Swallow: Able to elicit    Oral/Motor/Sensory Function Overall Oral Motor/Sensory Function: Within functional limits   Ice Chips Ice chips: Within functional limits Presentation: Spoon   Thin Liquid Thin Liquid: Within functional limits Presentation: Straw;Self Fed    Nectar Thick Nectar Thick Liquid: Not tested   Honey Thick Honey Thick Liquid: Not tested   Puree Puree: Not tested(patient refused)  Solid     Solid: Within  functional limits Presentation: Self Fed     Vincent Basden MA, CCC-SLP   Vincent Miranda Vincent Miranda 09/18/2018,11:10 AM

## 2018-09-19 DIAGNOSIS — R1319 Other dysphagia: Secondary | ICD-10-CM

## 2018-09-19 DIAGNOSIS — M86671 Other chronic osteomyelitis, right ankle and foot: Secondary | ICD-10-CM

## 2018-09-19 DIAGNOSIS — M86672 Other chronic osteomyelitis, left ankle and foot: Secondary | ICD-10-CM

## 2018-09-19 LAB — VANCOMYCIN, RANDOM: VANCOMYCIN RM: 17

## 2018-09-19 LAB — BASIC METABOLIC PANEL
Anion gap: 4 — ABNORMAL LOW (ref 5–15)
BUN: 76 mg/dL — AB (ref 8–23)
CHLORIDE: 119 mmol/L — AB (ref 98–111)
CO2: 21 mmol/L — ABNORMAL LOW (ref 22–32)
Calcium: 8.8 mg/dL — ABNORMAL LOW (ref 8.9–10.3)
Creatinine, Ser: 3.54 mg/dL — ABNORMAL HIGH (ref 0.61–1.24)
GFR calc Af Amer: 16 mL/min — ABNORMAL LOW (ref >60)
GFR calc non Af Amer: 14 mL/min — ABNORMAL LOW (ref >60)
GLUCOSE: 100 mg/dL — AB (ref 70–99)
POTASSIUM: 3.1 mmol/L — AB (ref 3.5–5.1)
SODIUM: 144 mmol/L (ref 135–145)

## 2018-09-19 MED ORDER — ORAL CARE MOUTH RINSE
15.0000 mL | Freq: Two times a day (BID) | OROMUCOSAL | Status: DC
  Administered 2018-09-19 – 2018-09-25 (×7): 15 mL via OROMUCOSAL

## 2018-09-19 MED ORDER — VANCOMYCIN HCL IN DEXTROSE 1-5 GM/200ML-% IV SOLN
1000.0000 mg | INTRAVENOUS | Status: DC
  Administered 2018-09-19: 1000 mg via INTRAVENOUS
  Filled 2018-09-19 (×2): qty 200

## 2018-09-19 MED ORDER — POTASSIUM CHLORIDE 10 MEQ/100ML IV SOLN
10.0000 meq | INTRAVENOUS | Status: AC
  Administered 2018-09-19 (×4): 10 meq via INTRAVENOUS
  Filled 2018-09-19 (×4): qty 100

## 2018-09-19 MED ORDER — PNEUMOCOCCAL VAC POLYVALENT 25 MCG/0.5ML IJ INJ
0.5000 mL | INJECTION | INTRAMUSCULAR | Status: DC
  Filled 2018-09-19: qty 0.5

## 2018-09-19 MED ORDER — DEXTROSE 5 % IV SOLN
INTRAVENOUS | Status: AC
  Administered 2018-09-19 (×2): via INTRAVENOUS

## 2018-09-19 NOTE — Consult Note (Signed)
WOC Nurse wound consult note Reason for Consult: bilateral feet wounds, full thickness, non healing chronic wounds. Left foot has first and second toe amputations, possible osteomyelitis. Right foot has partial amputation of 5th toe. No osteomyelitis noted in xray today.  Wound type:venous and arterial insufficiency, infectious. Pressure Injury POA: NA Measurement: Right foot: medial ankle 11cm x 15cm x 0.5cm. Dorsal foot 4cm  X 4cm  X 0.5cm. Distal  Dorsal at base of toes 8cm x 6cm x 0.4cm. Lateral foot 10cm x 5cm x 0.8cm. Great toe on right foot 1cm x 2cm x 0.1cm. Left foot: proximal anterior foot 10cm x 9cm x 0.1cm. (this one alone is old healed scar tissue) between both toes that are left are 2.5cm x 0.5cm x 0.1cm, medial foot has 15cm x 10cm x 0.5cm. All wounds are slick pink non granulating with yellow dried exudate on right and left foot is malodorous. And has copious yellow exudate. All wounds have epibole edges. Wound bed:see above Drainage (amount, consistency, odor) see above Periwound:scar tissue, intact. Dressing procedure/placement/frequency:I have provided nurses with orders for NS wet to dry dressings while in hospital. Wife likes to use Silvadene but wounds need cleansing and light debridement with wet to dry gauze. Wife can continue Silvadene when discharged. Nutritional consult for optimal wound healing, please order if you agree. We will not follow, but will remain available to this patient, to nursing, and the medical and/or surgical teams.  Please re-consult if we need to assist further.  Barnett Hatter, RN-C, WTA-C, [facility] Wound Treatment Associate Ostomy Care Associate

## 2018-09-19 NOTE — Progress Notes (Addendum)
Subjective: Vincent Miranda is doing well. He mentions that "his swallowing seems to be better". Does not have any acute complaint. Wife is not in the room today.  Objective:  Vital signs in last 24 hours: Vitals:   09/18/18 2015 09/18/18 2039 09/18/18 2328 09/19/18 0556  BP:  112/75 117/66 104/69  Pulse:  94 83 79  Resp: 19 (!) 27 18 15   Temp:  97.9 F (36.6 C)    TempSrc:  Oral    SpO2:  97% 96%   Weight:      Height:       Physical Exam  Constitutional: Vital signs are normal. He appears malnourished and dehydrated. He appears not lethargic and to not be writhing in pain. He appears cachectic. He does not have a sickly appearance.  HEENT: Thrush on the tongue and uvula is improved.  Cardiovascular: Normal rate, regular rhythm, normal heart sounds and intact distal pulses.  No murmur heard. Pulmonary/Chest: Effort normal and breath sounds normal. No respiratory distress. He has no wheezes. He has no rales.  Abdominal: Soft. Bowel sounds are normal. There is no tenderness.  Musculoskeletal: He exhibits no edema. Feet chronic ulcers are warped. Dressing is clean and intact. Neurological: He is alert. He appears not lethargic.  Psychiatric: Mood and affect normal.   Assessment/Plan:  Principal Problem:   AKI (acute kidney injury) (HCC) Active Problems:   Protein-calorie malnutrition, severe (HCC)   Essential hypertension   CKD (chronic kidney disease), stage III (HCC)   Chronic osteomyelitis (HCC)   Hypernatremia   High anion gap metabolic acidosis   Dysphagia   Thrush   Hypomagnesemia  Vincent Miranda is a 84 yo M w/ PMH of chronic lower extremity osteomyelitis and nonhealing ulcers, a.fib, CKD3, PVD, presented with weakness, poor PO intake due to difficulty swallowing. He was found to be hypotensive and had AKI on arrival.  Esophageal dysphagialikely 2/2 candidiasis, vs pill  induced esophagitis(doxycycline), vs motility dysfunction or other GI patholoy:  HIV  negative thrush on his tongue and uvula improved No neurologic deficit Speech and swallow eval performed and   -C/w Fluconazole 200 mg IV QD  -Dysphagia 3 diet  Chronic bilateral osteomyelitis on feet and nonhealing chronic wounds: Remained stable. Wound care consulted and signed off, recommended wet-to-dry dressing in hospital by nurse.  And nutritional consult   -Continue IV Vanco and cefepime -Continue wound dressing BMP Latest Ref Rng & Units 09/19/2018 09/18/2018 09/17/2018  Glucose 70 - 99 mg/dL 629(B) 96 87  BUN 8 - 23 mg/dL 28(U) 13(K) 44(W)  Creatinine 0.61 - 1.24 mg/dL 1.02(V) 2.53(G) 6.44(I)  BUN/Creat Ratio 10 - 24 - - -  Sodium 135 - 145 mmol/L 144 148(H) 151(H)  Potassium 3.5 - 5.1 mmol/L 3.1(L) 3.5 3.0(L)  Chloride 98 - 111 mmol/L 119(H) 120(H) 123(H)  CO2 22 - 32 mmol/L 21(L) 19(L) 21(L)  Calcium 8.9 - 10.3 mg/dL 3.4(V) 4.2(V) 7.5(L)     AKI and setting of CKD 3: Improved but not at baseline yet Patient is still dry  -D5W 75 mg/h for 24 hours -BMP daily  Hypernatremia 2/2 dehydration due to poor oral intake: Na 153 on arrival. Now 148. Free water deficit: 2.4 li  -C/w D5 132ml/h  Now 144. Free water deficit: 2.4 li repleted by D5.  Has received some half saline overnight. We switch to D5W. -D5W 75 ml/h for 24 h.  Hx of dCHF: Patient is hypovulemic due to poor PO intake. Last Echo 60-65% with garde 1 diastolic  dysfunction. On furosemide 20-40mg  at home  - Hold home Lasix - C/w home med: aspirin 81mg  daily   Hx of Gout - c/w home meds: allopurinol 200mg  daily  DVT prophx: heparin sq Diet: Dysphagia 3 IV fluid: D5W 75 ml/7 till 11/1 AM Code: Full  Dispo: Anticipated discharge in approximately 1-2 day(s).   Chevis Pretty, MD 09/19/2018, 6:24 AM Pager: [phone]

## 2018-09-19 NOTE — Progress Notes (Addendum)
Pharmacy Antibiotic Note  Vincent Miranda is a 84 y.o. male admitted on 09/17/2018 with osteomyelitis.  PMH is significant for chronic osteomyelitis and nonhealing lower extremity wounds, PVD, CKD stage III with acute on chronic AKI, HTN, Afib not on PTA anticoagulation.  Patient was taking doxycycline and cephalexin for chronic osteomyelitis before admission. Pharmacy has been consulted for Vancomycin and Cefepime dosing.   WBC 11.9, afebrile, Scr 3.9 > 3.54 (baseline ~2.5), CrCl improved to ~69ml/min. Vancomycin random level this morning was 17 approximately 36 hours after loading dose.    Plan: Cefepime 500 mg IV q24h Vancomycin 1000 mg IV Q48H Fluconazole 200 mg IV Q24H for oral candidiasis  Monitor and adjust per renal fx, clinical status, C&S, vanc troughs as needed  Height: 5\' 11"  (180.3 cm) Weight: 145 lb 8.1 oz (66 kg) IBW/kg (Calculated) : 75.3  Temp (24hrs), Avg:97.7 F (36.5 C), Min:97.5 F (36.4 C), Max:98 F (36.7 C)  Recent Labs  Lab 09/17/18 1723 09/17/18 1733 09/17/18 1941 09/17/18 2305 09/18/18 0708 09/18/18 0714 09/19/18 0445 09/19/18 0651  WBC 11.8*  --   --   --  11.9*  --   --   --   CREATININE 4.52*  --   --  3.82*  --  3.90*  --  3.54*  LATICACIDVEN  --  2.70* 1.98*  --   --   --   --   --   VANCORANDOM  --   --   --   --   --   --  17  --     Estimated Creatinine Clearance: 12.7 mL/min (A) (by C-G formula based on SCr of 3.54 mg/dL (H)).    No Known Allergies  Antimicrobials this admission: Vancomycin 10/29 >>  Cefepime 10/29 >>  Fluconazole 10/29 >>  Dose adjustments this admission: N/A  Microbiology results: 10/29 Bcx: ngtd 10/29 MRSA PCR: positive  Thank you for allowing pharmacy to be a part of this patient's care.  Ewing Schlein, PharmD PGY1 Pharmacy Resident 09/19/2018    9:32 AM

## 2018-09-19 NOTE — Progress Notes (Signed)
Initial Nutrition Assessment  DOCUMENTATION CODES:   Severe malnutrition in context of chronic illness  INTERVENTION:    Multivitamin daily  Magic cup TID with meals, each supplement provides 290 kcal and 9 grams of protein  NUTRITION DIAGNOSIS:   Severe Malnutrition related to chronic illness(CKD) as evidenced by severe fat depletion, severe muscle depletion, percent weight loss(17% weight loss within 1 month).  GOAL:   Patient will meet greater than or equal to 90% of their needs  MONITOR:   PO intake, Supplement acceptance, Labs, Skin  REASON FOR ASSESSMENT:   Consult Assessment of nutrition requirement/status  ASSESSMENT:   84 yo male with PMH of HTN, CKD, anemia, chronic osteomyelitis, PVD, A fib who was admitted on 10/29 with progressive dysphagia.  Patient with ongoing poor appetite and intake related to difficulty swallowing. S/P SLP evaluation 10/30, dysphagia 3 diet with thin liquids initiated.   Patient with a significant amount of weight loss, 17% over the past 3 weeks.  Labs and medications reviewed.   NUTRITION - FOCUSED PHYSICAL EXAM:    Most Recent Value  Orbital Region  Severe depletion  Upper Arm Region  Severe depletion  Thoracic and Lumbar Region  Severe depletion  Buccal Region  Severe depletion  Temple Region  Severe depletion  Clavicle Bone Region  Severe depletion  Clavicle and Acromion Bone Region  Severe depletion  Scapular Bone Region  Severe depletion  Dorsal Hand  Severe depletion  Patellar Region  Severe depletion  Anterior Thigh Region  Severe depletion  Posterior Calf Region  Severe depletion  Edema (RD Assessment)  None  Hair  Reviewed  Eyes  Reviewed  Mouth  Reviewed  Skin  Reviewed  Nails  Reviewed       Diet Order:   Diet Order            DIET DYS 3 Room service appropriate? Yes; Fluid consistency: Thin  Diet effective now              EDUCATION NEEDS:   No education needs have been identified at this  time  Skin:  Skin Integrity Issues:: Diabetic Ulcer Diabetic Ulcer: R leg and foot, L foot  Last BM:  10/29  Height:   Ht Readings from Last 1 Encounters:  09/18/18 5\' 11"  (1.803 m)    Weight:   Wt Readings from Last 1 Encounters:  09/18/18 66 kg    Ideal Body Weight:  78.2 kg  BMI:  Body mass index is 20.29 kg/m.  Estimated Nutritional Needs:   Kcal:  1800-2000  Protein:  100-115 gm  Fluid:  1.8-2 L    Joaquin Courts, RD, LDN, CNSC Pager [phone] After Hours Pager [phone]

## 2018-09-20 DIAGNOSIS — M8669 Other chronic osteomyelitis, multiple sites: Secondary | ICD-10-CM

## 2018-09-20 LAB — BASIC METABOLIC PANEL
Anion gap: 4 — ABNORMAL LOW (ref 5–15)
BUN: 64 mg/dL — AB (ref 8–23)
CO2: 22 mmol/L (ref 22–32)
Calcium: 8.7 mg/dL — ABNORMAL LOW (ref 8.9–10.3)
Chloride: 118 mmol/L — ABNORMAL HIGH (ref 98–111)
Creatinine, Ser: 3.08 mg/dL — ABNORMAL HIGH (ref 0.61–1.24)
GFR calc Af Amer: 19 mL/min — ABNORMAL LOW (ref >60)
GFR, EST NON AFRICAN AMERICAN: 16 mL/min — AB (ref >60)
GLUCOSE: 92 mg/dL (ref 70–99)
POTASSIUM: 3.6 mmol/L (ref 3.5–5.1)
Sodium: 144 mmol/L (ref 135–145)

## 2018-09-20 MED ORDER — FLUCONAZOLE IN SODIUM CHLORIDE 200-0.9 MG/100ML-% IV SOLN
200.0000 mg | INTRAVENOUS | Status: DC
  Administered 2018-09-20 – 2018-09-24 (×5): 200 mg via INTRAVENOUS
  Filled 2018-09-20 (×6): qty 100

## 2018-09-20 MED ORDER — SILVER SULFADIAZINE 1 % EX CREA
TOPICAL_CREAM | Freq: Every day | CUTANEOUS | Status: DC
  Administered 2018-09-20 – 2018-09-24 (×7): via TOPICAL
  Filled 2018-09-20 (×2): qty 85

## 2018-09-20 MED ORDER — DEXTROSE 5 % IV SOLN: INTRAVENOUS | Status: DC

## 2018-09-20 NOTE — Progress Notes (Signed)
  Speech Language Pathology Treatment: Dysphagia  Patient Details Name: Vincent Miranda MRN: [id number] DOB: Sep 22, 1925 Today's Date: 09/20/2018 Time: 3403-5248 SLP Time Calculation (min) (ACUTE ONLY): 18 min  Assessment / Plan / Recommendation Clinical Impression  Po intake continues to be minimal with patient reporting poor appetite but improved swallowing function as compared to admission. Patient again demonstrates no indication of an oropharyngeal dysphagia and today did not complain of globus or other signs of esophageal dysfunction. Vincent Miranda appears to be improving on tongue and palate with initiation of Diflucan. Continue to suspect a primary esophageal dysphagia as cause of acute difficulty swallowing, likely chronic in nature to some degree. No further SLP needs indicated. Defer need for further esophageal w/u to MD.     HPI HPI: 84 yo M with PMHx significant for chronic osteomyelitis and lower extremity wounds, PVD, CKD, HTN,a-fib. Not on any anticoagulation due to patient's choice presented to ED with complaint of worsening generalized weakness, decreased appetite and worsening bilateral foot pain and purulent drainage from his chronic wound. CXR revealed no acute cardiopulmonary abnormality; aortic atherosclerosis.      SLP Plan  All goals met       Recommendations  Diet recommendations: Dysphagia 3 (mechanical soft);Thin liquid Liquids provided via: Cup;Straw Medication Administration: Whole meds with liquid(crush if needed) Supervision: Staff to assist with self feeding;Intermittent supervision to cue for compensatory strategies Compensations: Slow rate;Small sips/bites;Follow solids with liquid Postural Changes and/or Swallow Maneuvers: Seated upright 90 degrees;Upright 30-60 min after meal                Oral Care Recommendations: Oral care BID Follow up Recommendations: None SLP Visit Diagnosis: Dysphagia, unspecified (R13.10) Plan: All goals met        GO          Vincent Rainwater MA, CCC-SLP        Vincent Miranda Vincent Miranda 09/20/2018, 8:54 AM

## 2018-09-20 NOTE — Progress Notes (Addendum)
  Date: 09/20/2018  Patient name: Vincent Miranda  Medical record number: [id number]  Date of birth: December 11, 1924   I have seen and evaluated this patient and I have discussed the plan of care with the house staff. Please see their note for complete details. I concur with their findings with the following additions/corrections:   Seen with the team on rounds this morning, with his wife at the bedside.  He appears quite weak and frail, and let his wife do the most of the talking.  Intake is still very limited, and his wife reports he barely ate anything for breakfast because they brought him oatmeal, which he does not eat, and he had difficulty getting the pancakes down.  He did drink a little bit of juice.  He reports swallowing is not painful, but is still difficult.  We discussed the overall state of his health and his recent rapid decline related to inability to take in enough nutrition.  His wife expressed hope that treating his oral thrush may improve his swallowing and intake.  We shared her hope, but also communicated we are worried that his intake amount may not improve much, as a lot of it is also likely related to his stroke.  We also examined his chronic bilateral lower extremity osteomyelitis wounds today during a dressing change, and the wounds appeared clean with pink wound base and minimal drainage.  We will continue IV antibiotics while he is inpatient, but reiterated that these chronic infections will persist for the remainder of his life.  Given that he has multiple conditions which are not curable, we discussed consulting palliative care for assistance with managing symptoms associated with these conditions and maximizing quality and function of his life.  They were agreeable to this.  I tried to begin addressing with him what is important in his life, but he said he had not thought about this.  I encouraged him and his wife to discuss this further.  In the meantime, we will  try to maximize nutrition, get him up out of bed every day, continue free water repletion for hypernatremia (likely has limited ability to concentrate urine due to CKD) and readdress goals of care in the coming days.  Jessy Oto, M.D., Ph.D. 09/20/2018, 3:16 PM

## 2018-09-20 NOTE — Consult Note (Signed)
   [facility] CM Inpatient Consult   09/20/2018  Vincent Miranda [birth year] [id number]    Patient screened for potential [facility] Care Management services due to unplanned readmission risk score of 24% (high).  Spoke with inpatient RNCM. Palliative consult is pending. Will continue to follow along for progression and disposition plans. Will engage for [facility] Care Management if appropriate.    [name], MSN-Ed, RN,BSN [facility] Liaison [phone]

## 2018-09-20 NOTE — Progress Notes (Signed)
PMT RN Note: Consult order noted. PMT is experiencing very high consult volume and will be staffed with emergency coverage only on 11/1 due to the team being out of the office at the Palliative Symposium.   If you need interim recommendations, please call [phone] to leave a message for the nurse. All medical emergencies should be directed to the attending physician.   Once there is an available provider (likely 11/2 or 11/3 due to weekend staffing), this patient will be evaluated and seen by our team.  Margret Chance. Milo Solana, RN, BSN, [facility] Palliative Medicine Team 09/20/2018 11:11 AM Office [phone]

## 2018-09-20 NOTE — Care Management Note (Addendum)
Case Management Note  Patient Details  Name: Vincent Miranda MRN: [id number] Date of Birth: 07/20/1925  Subjective/Objective:  Pt presented for AKI-Chronic lower extremity osteomyelitis and weakness. PTA from home with support of family. PT/OT and Palliative to consult.                    Action/Plan: Patient has utilized [facility] in the past and wants to utilize again. CM will continue to monitor for disposition needs.   Expected Discharge Date:  09/21/18               Expected Discharge Plan:  Home w Home Health Services  In-House Referral:  Hospice / Palliative Care(Palliative to Consult)  Discharge planning Services  CM Consult  Post Acute Care Choice:  Home Health Choice offered to:     DME Arranged:    DME Agency:     HH Arranged:    HH Agency:  [facility](Utilized in the past and wants )  Status of Service:  In process, will continue to follow  If discussed at Long Length of Stay Meetings, dates discussed:    Additional Comments: 1050 09-23-18 Tomi Bamberger, RN,BSN [phone] CM spoke with wife this am in regards to disposition plan- pt was unclear on when patient would be stable for transition home. CM did discuss ambulance transport can be utilized when stable. CM did call Physician to discuss plan of care.  Gala Lewandowsky, RN 09/20/2018, 10:58 AM

## 2018-09-20 NOTE — Progress Notes (Signed)
Subjective: Vincent Miranda tells well today.  Wife is present at room today. patient has a started eating, however does not like hospitals food.  Patient's wife asked if she can bring food from at home and we confirmed that is completely okay. We talked about the option for palliative care.  It was explained completely for patient and his wife, they agreed to go forward to take benefit of that.  Objective:  Vital signs in last 24 hours: Vitals:   09/19/18 1650 09/19/18 2154 09/20/18 0246 09/20/18 0556  BP: (!) 79/67 108/65 129/67 103/63  Pulse: 81 69 70 64  Resp: (!) 21 20 14 18   Temp: 97.8 F (36.6 C)  (!) 97.2 F (36.2 C) 97.6 F (36.4 C)  TempSrc: Oral  Oral   SpO2: 100% 100% 97%   Weight:    67.3 kg  Height:       Physical Exam  Constitutional: He is oriented to person, place, and time. Vital signs are normal. He appears malnourished and dehydrated but better than yesterday. He appears to not be writhing in pain. He appears cachectic.  Non-toxic appearance. He does not have a sickly appearance.  HEENT: Thrush on tongue and uvula improved Cardiovascular: Normal rate, regular rhythm, normal heart sounds and intact distal pulses.  No murmur heard. Pulmonary/Chest: Effort normal and breath sounds normal. He has no wheezes. He has no rales.  Abdominal: Soft. Bowel sounds are normal. There is no tenderness.  Musculoskeletal: Chronic multiple non healing ulcers are at baseline. No drainage. No sign of worsening or acute infection. No LLE Neurological: He is alert and oriented Psychiatric: Mood and affect normal.   Assessment/Plan:  Principal Problem:   AKI (acute kidney injury) (HCC) Active Problems:   Protein-calorie malnutrition, severe (HCC)   Essential hypertension   CKD (chronic kidney disease), stage III (HCC)   Chronic osteomyelitis (HCC)   Hypernatremia   High anion gap metabolic acidosis   Dysphagia   Thrush   Hypomagnesemia  Vincent Miranda is a 84 yo M w/ PMH of  chronic lower extremityosteomyelitis and nonhealing ulcers, a.fib, CKD3, PVD, presented with weakness, poor PO intake due to difficulty swallowing. He was found to be hypotensive and had AKI on arrival.  Esophageal dysphagialikely 2/2 candidiasis, vs pill induced esophagitis(doxycycline), vs motility dysfunction or other GI patholoy:  thrushon his tongue and uvula improved on exam today Patient has a started eating, however does not like hospitals food. Generally looks malnourished and dry on exam Current goal is put him back on p.o. diet as much as possible.  We will give dysphagia treatment diet but no cardiac or other diet limitation.  -C/w Fluconazole 200 mg IV QD  -Dysphagia 3 diet (Can have food brought from home) -Palliative consult  Chronic bilateral osteomyelitis on feet and nonhealing chronic wounds: Remained stable, with no acute evidence of infection  -Continue wet-to-dry wound dressing -Can apply Silver sulfadiazine  -Up in chair daily  Hypernatremia2/2 dehydration due to poor oral intake:Na 153on arrival. Now at 144.  Free water deficit 900 mL -D5 W at 50 cc/h for 24 hours  -BMP daily  AKI and setting of CKD 3: Improved but not at baseline yet Patient is still dry -Continue IV fluid -BMP daily  Hx of dCHF: Patient is hypovulemic due to poor PO intake. Last Echo 60-65% with garde 1 diastolic dysfunction. On furosemide 20-40mg  at home  - Hold home Lasix - C/w home med: aspirin 81mg  daily  - Strict I/O  DVT prophx:heparin  sq Diet:Dysphagia 3 IV fluid:D5W 50 ml/h x 24 h Code:Full  Dispo: Anticipated discharge in approximately 1-2 days  Vincent Pretty, MD 09/20/2018, 2:18 PM Pager: [phone]

## 2018-09-20 NOTE — Evaluation (Signed)
Physical Therapy Evaluation Patient Details Name: Vincent Miranda MRN: [id number] DOB: February 02, 1925 Today's Date: 09/20/2018   History of Present Illness  Pt is a 84 y/o male admitted secondary to generalized weakness and dysphagia. Pt with chronic LE wounds, refusal of amputation previously. PMH including but not limited to chronic osteomyelitis and lower extremity wounds,PVD, CKD, HTN, a-fib.  Clinical Impression  Patient presents with limited mobility due to pain, fatigue, weakness and overall FTT.  Currently self limited with mobility due to fatigue and focused on contacting his wife.  He likely would need +2 A for safety with mobility and may not have enough help at home currently.  Safest d/c plan would be SNF, but if decides to go home would need hoyer lift, hospital bed and wheelchair and 3:1 in addition to [facility] and PT.    Follow Up Recommendations SNF;Supervision/Assistance - 24 hour    Equipment Recommendations  None recommended by PT    Recommendations for Other Services       Precautions / Restrictions Precautions Precautions: Fall      Mobility  Bed Mobility Overal bed mobility: Needs Assistance             General bed mobility comments: patient refusing OOB or even EOB with much encouragement and increased time; assist to reposition upper trunk as leaning to R  Transfers                    Ambulation/Gait                Stairs            Wheelchair Mobility    Modified Rankin (Stroke Patients Only)       Balance                                             Pertinent Vitals/Pain Faces Pain Scale: Hurts little more Pain Location: feet with movement Pain Descriptors / Indicators: Sore Pain Intervention(s): Monitored during session;Limited activity within patient's tolerance    Home Living Family/patient expects to be discharged to:: Private residence Living Arrangements: Spouse/significant  other Available Help at Discharge: Family;Available PRN/intermittently Type of Home: House Home Access: Stairs to enter Entrance Stairs-Rails: None Entrance Stairs-Number of Steps: 1 Home Layout: One level Home Equipment: Environmental consultant - 4 wheels      Prior Function Level of Independence: Needs assistance   Gait / Transfers Assistance Needed: ambulates with rollator  ADL's / Homemaking Assistance Needed: requires some assistance with bathing/dressing from wife        Hand Dominance   Dominant Hand: Left(uses both hands)    Extremity/Trunk Assessment   Upper Extremity Assessment Upper Extremity Assessment: RUE deficits/detail;LUE deficits/detail RUE Deficits / Details: AAROM limited shoulder flexion with strength 3-/5 shoulder flexion, elbow flexion 3+/5 LUE Deficits / Details: AAROM limited shoulder flexion with strength 2/5 shoulder flexion, elbow flexion 3+/5    Lower Extremity Assessment Lower Extremity Assessment: RLE deficits/detail;LLE deficits/detail RLE Deficits / Details: wraps covering feet and ankles, AAROM WFL, strength hip flexion 3-/5, knee extension 4/5 LLE Deficits / Details: wraps covering feet and ankles, AAROM WFL, strength hip flexion 2/5, knee extension 4/5    Cervical / Trunk Assessment Cervical / Trunk Assessment: Kyphotic  Communication   Communication: HOH  Cognition Arousal/Alertness: Awake/alert Behavior During Therapy: Flat affect Overall Cognitive Status: No family/caregiver present to  determine baseline cognitive functioning                                 General Comments: did not want to participate despite much encouragement and education regarding difficult to go home if not able to move      General Comments      Exercises     Assessment/Plan    PT Assessment Patient needs continued PT services  PT Problem List Decreased strength;Decreased activity tolerance;Decreased balance;Decreased mobility;Decreased safety  awareness;Decreased knowledge of precautions;Pain       PT Treatment Interventions DME instruction;Gait training;Therapeutic exercise;Patient/family education;Therapeutic activities;Functional mobility training;Balance training    PT Goals (Current goals can be found in the Care Plan section)  Acute Rehab PT Goals Patient Stated Goal: to rest PT Goal Formulation: With patient Time For Goal Achievement: 09/27/18 Potential to Achieve Goals: Poor    Frequency Min 2X/week   Barriers to discharge        Co-evaluation               AM-PAC PT "6 Clicks" Daily Activity  Outcome Measure Difficulty turning over in bed (including adjusting bedclothes, sheets and blankets)?: Unable Difficulty moving from lying on back to sitting on the side of the bed? : Unable Difficulty sitting down on and standing up from a chair with arms (e.g., wheelchair, bedside commode, etc,.)?: Unable Help needed moving to and from a bed to chair (including a wheelchair)?: Total Help needed walking in hospital room?: Total Help needed climbing 3-5 steps with a railing? : Total 6 Click Score: 6    End of Session   Activity Tolerance: Patient limited by fatigue Patient left: in bed;with call bell/phone within reach   PT Visit Diagnosis: Other abnormalities of gait and mobility (R26.89);Muscle weakness (generalized) (M62.81)    Time: 1610-9604 PT Time Calculation (min) (ACUTE ONLY): 22 min   Charges:   PT Evaluation $PT Eval Moderate Complexity: 1 Mod          Vincent Miranda, [facility] [phone] 09/20/2018   Vincent Miranda 09/20/2018, 5:14 PM

## 2018-09-20 NOTE — Care Management Important Message (Signed)
Important Message  Patient Details  Name: Vincent Miranda MRN: [id number] Date of Birth: [birth year]   Medicare Important Message Given:  Yes    Gala Lewandowsky, RN 09/20/2018, 10:54 AM

## 2018-09-21 DIAGNOSIS — Z515 Encounter for palliative care: Secondary | ICD-10-CM

## 2018-09-21 LAB — BASIC METABOLIC PANEL
Anion gap: 6 (ref 5–15)
BUN: 58 mg/dL — AB (ref 8–23)
CO2: 18 mmol/L — ABNORMAL LOW (ref 22–32)
CREATININE: 2.72 mg/dL — AB (ref 0.61–1.24)
Calcium: 9.2 mg/dL (ref 8.9–10.3)
Chloride: 123 mmol/L — ABNORMAL HIGH (ref 98–111)
GFR, EST AFRICAN AMERICAN: 22 mL/min — AB (ref >60)
GFR, EST NON AFRICAN AMERICAN: 19 mL/min — AB (ref >60)
Glucose, Bld: 90 mg/dL (ref 70–99)
Potassium: 3.9 mmol/L (ref 3.5–5.1)
Sodium: 147 mmol/L — ABNORMAL HIGH (ref 135–145)

## 2018-09-21 MED ORDER — DOXYCYCLINE HYCLATE 100 MG PO TABS
100.0000 mg | ORAL_TABLET | Freq: Two times a day (BID) | ORAL | Status: DC
  Administered 2018-09-21 – 2018-09-25 (×6): 100 mg via ORAL
  Filled 2018-09-21 (×9): qty 1

## 2018-09-21 MED ORDER — DEXTROSE 5 % IV SOLN
INTRAVENOUS | Status: DC
  Administered 2018-09-21: 18:00:00 via INTRAVENOUS

## 2018-09-21 MED ORDER — CEPHALEXIN 500 MG PO CAPS
500.0000 mg | ORAL_CAPSULE | Freq: Two times a day (BID) | ORAL | Status: DC
  Administered 2018-09-21 – 2018-09-25 (×7): 500 mg via ORAL
  Filled 2018-09-21 (×9): qty 1

## 2018-09-21 MED ORDER — MIRTAZAPINE 15 MG PO TBDP
7.5000 mg | ORAL_TABLET | Freq: Every day | ORAL | Status: DC
  Administered 2018-09-21: 7.5 mg via ORAL
  Filled 2018-09-21: qty 0.5

## 2018-09-21 NOTE — Progress Notes (Addendum)
Subjective: Patient is doing well this AM. No family at bedside. He tells me that he ate a little bit of mash potatoes and gravy last night. His appetite is still poor 2/2 lack of taste. He is no longer having difficulty swallowing or globus sensation. The only food that sounds good to him is wild [place]. He voices concerns about some abnormal thoughts overnight but isn't quite sure how to explain it. We discussed the plan to switch him to oral antibiotics and I encouraged him to increase his PO intake.   Objective: Vital signs in last 24 hours: Vitals:   09/20/18 0556 09/20/18 1643 09/20/18 2014 09/21/18 0620  BP: 103/63 93/68 118/65 102/71  Pulse: 64  95 97  Resp: 18 17 15 19   Temp: 97.6 F (36.4 C) 98 F (36.7 C) 98.2 F (36.8 C) (!) 97.5 F (36.4 C)  TempSrc:  Oral Oral Oral  SpO2:  100% 100%   Weight: 67.3 kg     Height:       General: Thin, frail male in no acute distress Pulm: Good air movement with no wheezing or crackles  CV: RRR, no murmurs, no rubs  Abdomen: Soft, non-distended, no tenderness to palpation  Extremities: No LE edema, bilateral LE dressing clean and dry    Assessment/Plan:  Vincent Miranda is a 84 y.o male with chronic bilateral osteomyelitis on chronic suppressive antibiotic therapy, CKD Stage IV, CVA/PVD, and HTN who presented to the ED on 09/17/18 with encephalopathy and decreased PO intake subsequently found to have acute on chronic kidney failure and hypernatremia. On PE he was found to have thrush which was felt to be contributing to his decreased PO intake. He has been treated with IV fluconazole and hydrated with D5W.   Severe Malnutrition in Context of Chronic Illness  - Nutrition consult recommending daily multivitamin and magic cups TID with meals  - Patient and wife are reluctant to use meal supplements due to previous experience with causing diarrhea  - Continuing goals of care discussion and encouraging PO intake  - Will ask palliative  care about starting an appetite stimulant  - Palliative care consult placed to help with symptom management and goals of care discussion   Esophageal dysphagia multifactorial, due to candidal esophagitis and possibly functional limitations from prior CVA - Discomfort with swallowing has resolved but patient continues to have poor appetite and PO intake  - Continuing IV Fluconazole for 10 days of treatment (Day #4) - Dysphagia diet #3  AKI on CKD Stage IV 2/2 pre-renal etiology  - Creatinine down trending but not quite back to baseline  - No indications for HD  - Avoid nephrotoxic drugs and continue to monitor   Hypernatremia 2/2 decreased PO intake - Na increased to 147 today  - Free water deficit ~869mL  - Increase D5W to 143mL/hr  Chronic Bilateral Osteomyelitis  - Wounds without purulent discharge and clean wound beds, improved compared to prior  - Home antibiotics include doxycycline and cephalexin  - On IV Vancomycin and Cefepime 2/2 esophagitis, will transition try to transition back to doxycycline and keflex  - Continue wet-to-dry dressings with silver sulfadiazine   HFpEF, Stable  - No signs or symptoms of decompensation  - Holding home lasix - Daily volume status assessments   Dispo: Anticipated discharge in approximately 3-4 days pending palliative care consult. Given patient's poor PO intake and multiple chronic comorbidies he is at increased risk of readmission.   Levora Dredge, MD 09/21/2018, 8:31 AM

## 2018-09-21 NOTE — Progress Notes (Signed)
Patient ID: Vincent Miranda, male   DOB: Jul 19, 1925, 84 y.o.   MRN: [id number]  I met with patient and his wife.  Wife confirms having seen patient decline over the previous few months.  Prior to his recent hospitalization patient was semi-ambulatory in the home with use of a walker.  However, patient is now chair bound.  Patient's home is too small for use of a wheelchair, so family have been using a rolling desk chair.  Wife says her primary goal is to see if patient can regain his ability to ambulate in the home.  She would like for him to be able to engage in enjoyable activities outside the home that have been recently difficult due to his weakness.  Wife does verbalize an understanding that it might be impossible for patient to return to his previous functional baseline.  She says she wants to take him home at time of discharge.  We talked about option of home health, which they have had in the past.  We also discussed the option of hospice in the home.  Upon mentioning the word "hospice," wife responded that she does not like that word and it is forbidden in their home.  She clarified that she has seen experiences at hospice that she has disagreed with.  Upon exploring her experiences it became apparent that she believe hospice only occurred in a facility.  I clarified that most patients receive hospice in their home and that it can be beneficial at keeping patients out of the hospital, providing resources in the home, and ensuring that patient's are comfortable particularly in the setting of decline.  We discussed CODE STATUS at length.  Wife confirms that she has told providers previously that they would want an attempt at resuscitation.  We talked about the probable futility associated with resuscitative efforts.  We also discussed the dying process and the different settings in which that could occur.  Wife says that she is present focused and does not want to worry about the future.  Instead, she  wants to focus on today.  Wife does tell me that she knows that it is important to plan for the future and does intend to talk more with her husband about decision making.  Patient ate about 25% of his meal this morning.  However, fluid intake is likely not sufficient to prevent recurrence of hypernatremia. Note that there was a question about appetite stimulants.  The [facility] recommends against prescription appetite stimulants in order adults. There is not good data to suggest benefit and often the risks are significant in older adults. However, if one is desired, mirtazapine 7.5 mg nightly could be tried.  Megace is associated with increased risk of thromboembolic events and fluid retention.  Additionally, Megace has not been consistently shown to be efficacious outside of cancer cachexia.  A trial of dexamethasone could generally be considered.  However, patient's recent oral candidiasis and osteomyelitis might exclude the option.  Plan: Continue supportive care Discharge planning could include home health with palliative care following in the home Patient would be appropriate for hospice care but wife does not want this currently Family will benefit from continued conversations regarding goals Patient would not likely benefit from an appetite stimulant.  Will follow    Altha Harm, PhD, NP-C

## 2018-09-21 NOTE — Consult Note (Signed)
Consultation Note Date: 09/21/2018   Patient Name: Vincent Miranda  DOB: 11-16-1925  MRN: [id number]  Age / Sex: 84 y.o., male  PCP: Isabelle Course, MD Referring Physician: Lucious Groves, DO  Reason for Consultation: Establishing goals of care  HPI/Patient Profile: 84 y.o. male  with past medical history of chronic non-healing wounds to bilateral feet with chronic osteomyelitis, A. fib, CKD 3, PVD, history of CHF, dysphasia, who was recently hospitalized 08/26/2018 to 08/30/2018 with sepsis from nonhealing foot wounds.  He was discharged on oral antibiotics.  Patient now readmitted on 09/17/2018 with progressive weakness, hypertension, and hyponatremia secondary to poor oral intake.  Patient is also being treated for oral thrush.  Palliative care was asked to consult to help discuss goals of care.  Clinical Assessment and Goals of Care: I met with patient to discuss goals of care.  Patient is eating some breakfast and says his swallowing and oral discomfort is greatly improved.  He denies other distressing symptoms.  Patient seems to have somewhat poor insight regarding his health conditions and potential for significant improvement.  He talks at length about the enjoyment of hunting and that his goal would be to return home to his [place] acres where he can once again hunt.  However, he admits that he has been unable to engage in those activities for the past several years.  We talked about the likelihood that his health will continue to limit strenuous activity.  Patient describes having a good support system in the home.  He lives at home with his wife.  He has 5 children, all of whom are reportedly involved.  Patient does not have advance directives.  He would want his wife to be his decision maker if necessary.  We talked at length about his wishes for end-of-life care.  He speaks of his faith as being a  significant influence on his decision making.  He says his preference would be just to "fade away."  However, when talking about specific end-of-life decisions he says he is unsure.  I attempted to call his wife without success.  I have asked the nurse to call me when she arrives at the hospital.  SUMMARY OF RECOMMENDATIONS    1.  Continue supportive care 2.  RN to call when wife arrives 3.  Family meeting to clarify goals     Primary Diagnoses: Present on Admission: . (Resolved) Sepsis ([place]) . AKI (acute kidney injury) ([place]) . Chronic osteomyelitis ([place]) . Hypernatremia . High anion gap metabolic acidosis . Thrush . Protein-calorie malnutrition, severe ([place]) . CKD (chronic kidney disease), stage III ([place]) . Essential hypertension . Hypomagnesemia   I have reviewed the medical record, interviewed the patient and family, and examined the patient. The following aspects are pertinent.  Past Medical History:  Diagnosis Date  . Acute blood loss anemia 10/18/2013  . Anemia of chronic disease   . Atrial fibrillation ([place])   . Chronic kidney disease   . Chronic osteomyelitis ([place])   . Gout   .  History of blood transfusion    "related to anemia" (07/16/2017)  . Hypertension   . PVD (peripheral vascular disease) ([place])    Social History   Socioeconomic History  . Marital status: Married    Spouse name: Not on file  . Number of children: Not on file  . Years of education: Not on file  . Highest education level: Not on file  Occupational History  . Not on file  Social Needs  . Financial resource strain: Not hard at all  . Food insecurity:    Worry: Never true    Inability: Never true  . Transportation needs:    Medical: No    Non-medical: No  Tobacco Use  . Smoking status: Former Smoker    Packs/day: 1.00    Years: 43.00    Pack years: 43.00    Types: Cigarettes  . Smokeless tobacco: Never Used  . Tobacco comment: "quit smoking cigarettes in the 1980s"  Substance  and Sexual Activity  . Alcohol use: Not Currently    Comment: "nothing anymore" (02/03/2016)  . Drug use: No  . Sexual activity: Not Currently  Lifestyle  . Physical activity:    Days per week: 0 days    Minutes per session: 0 min  . Stress: Patient refused  Relationships  . Social connections:    Talks on phone: Patient refused    Gets together: Patient refused    Attends religious service: Patient refused    Active member of club or organization: Patient refused    Attends meetings of clubs or organizations: Patient refused    Relationship status: Patient refused  Other Topics Concern  . Not on file  Social History Narrative  . Not on file   History reviewed. No pertinent family history. Scheduled Meds: . allopurinol  200 mg Oral Daily  . aspirin EC  81 mg Oral Daily  . cephALEXin  500 mg Oral BID  . Chlorhexidine Gluconate Cloth  6 each Topical Q0600  . doxycycline  100 mg Oral BID  . heparin  5,000 Units Subcutaneous Q8H  . mouth rinse  15 mL Mouth Rinse BID  . multivitamin with minerals  1 tablet Oral Daily  . mupirocin ointment  1 application Nasal BID  . pneumococcal 23 valent vaccine  0.5 mL Intramuscular Tomorrow-1000  . silver sulfADIAZINE   Topical Daily   Continuous Infusions: . dextrose    . fluconazole (DIFLUCAN) IV 200 mg (09/20/18 2209)   PRN Meds:.acetaminophen **OR** acetaminophen, polyethylene glycol Medications Prior to Admission:  Prior to Admission medications   Medication Sig Start Date End Date Taking? Authorizing Provider  allopurinol (ZYLOPRIM) 100 MG tablet Take 2 tablets (200 mg total) by mouth daily. 05/15/18  Yes Jule Ser, DO  aspirin EC 81 MG tablet Take 81 mg by mouth daily.    Yes [provider]  cephALEXin (KEFLEX) 500 MG capsule Take 1 capsule (500 mg total) by mouth 2 (two) times daily. 08/30/18 10/11/18 Yes Masoudi, Elhamalsadat, MD  doxycycline (VIBRA-TABS) 100 MG tablet Take 1 tablet (100 mg total) by mouth 2 (two)  times daily. 08/30/18 10/11/18 Yes Masoudi, Elhamalsadat, MD  furosemide (LASIX) 20 MG tablet TAKE 1 TABLET BY MOUTH ONCE DAILY AS NEEDED IN  ADDITION  TO  40  MG  TABLET  WHEN  THERE  IS  EXTRA  SWELLING Patient taking differently: Take 60 mg by mouth daily. TAKE 1 TABLET BY MOUTH ONCE DAILY AS NEEDED IN  ADDITION  TO  40  MG  TABLET  WHEN  THERE  IS  EXTRA  SWELLING 09/01/18  Yes Masoudi, Elhamalsadat, MD  Multiple Vitamin (MULTIVITAMIN WITH MINERALS) TABS tablet Take 1 tablet by mouth daily. 08/31/18  Yes Masoudi, Elhamalsadat, MD  Potassium 95 MG TABS Take 1 tablet (95 mg total) by mouth daily. 09/01/18  Yes Masoudi, Elhamalsadat, MD  silver sulfADIAZINE (SSD) 1 % cream APPLY  CREAM EXTERNALLY TO AFFECTED AREA ONCE DAILY 09/17/18  Yes Axel Filler, MD  Zinc 50 MG TABS Take 50 mg by mouth daily.   Yes [provider]  furosemide (LASIX) 40 MG tablet Take 1 tablet (40 mg total) by mouth daily. Patient not taking: Reported on 09/17/2018 09/01/18   MasoudiDorthula Rue, MD  nutrition supplement, JUVEN, (JUVEN) PACK Take 1 packet by mouth 2 (two) times daily between meals. Patient not taking: Reported on 09/17/2018 08/31/18   Dewayne Hatch, MD   No Known Allergies Review of Systems  Constitutional: Positive for activity change and fatigue.  All other systems reviewed and are negative.   Physical Exam  Constitutional: He is oriented to person, place, and time.  Frail-appearing, thin lying in bed  Cardiovascular: Normal rate.  Pulmonary/Chest: Effort normal.  Unlabored  Neurological: He is alert and oriented to person, place, and time.  Skin: Skin is warm.  Wounds noted but not visualized, lower extremities wrapped    Vital Signs: BP 102/71 (BP Location: Left Arm)   Pulse 97   Temp (!) 97.5 F (36.4 C) (Oral)   Resp 19   Ht 5' 11" (1.803 m)   Wt 67.3 kg   SpO2 100%   BMI 20.69 kg/m  Pain Scale: 0-10   Pain Score: 0-No pain   SpO2: SpO2: 100 % O2  Device:SpO2: 100 % O2 Flow Rate: .O2 Flow Rate (L/min): 1.5 L/min  IO: Intake/output summary:   Intake/Output Summary (Last 24 hours) at 09/21/2018 1056 Last data filed at 09/21/2018 1610 Gross per 24 hour  Intake 700 ml  Output 400 ml  Net 300 ml    LBM: Last BM Date: 09/17/18 Baseline Weight: Weight: 72.7 kg Most recent weight: Weight: 67.3 kg     Palliative Assessment/Data:     Time In: 0930 Time Out: 1000 Time Total: 30 minutes Greater than 50%  of this time was spent counseling and coordinating care related to the above assessment and plan.  Signed by: Irean Hong, NP   Please contact Palliative Medicine Team phone at [phone] for questions and concerns.  For individual provider: See Shea Evans

## 2018-09-22 DIAGNOSIS — N183 Chronic kidney disease, stage 3 (moderate): Secondary | ICD-10-CM

## 2018-09-22 DIAGNOSIS — E872 Acidosis: Secondary | ICD-10-CM

## 2018-09-22 DIAGNOSIS — Z8673 Personal history of transient ischemic attack (TIA), and cerebral infarction without residual deficits: Secondary | ICD-10-CM

## 2018-09-22 DIAGNOSIS — I130 Hypertensive heart and chronic kidney disease with heart failure and stage 1 through stage 4 chronic kidney disease, or unspecified chronic kidney disease: Secondary | ICD-10-CM

## 2018-09-22 DIAGNOSIS — N184 Chronic kidney disease, stage 4 (severe): Secondary | ICD-10-CM

## 2018-09-22 LAB — CULTURE, BLOOD (ROUTINE X 2)
CULTURE: NO GROWTH
CULTURE: NO GROWTH

## 2018-09-22 LAB — BASIC METABOLIC PANEL
Anion gap: 5 (ref 5–15)
BUN: 56 mg/dL — AB (ref 8–23)
CO2: 19 mmol/L — ABNORMAL LOW (ref 22–32)
CREATININE: 2.63 mg/dL — AB (ref 0.61–1.24)
Calcium: 9.1 mg/dL (ref 8.9–10.3)
Chloride: 120 mmol/L — ABNORMAL HIGH (ref 98–111)
GFR calc Af Amer: 23 mL/min — ABNORMAL LOW (ref >60)
GFR, EST NON AFRICAN AMERICAN: 20 mL/min — AB (ref >60)
GLUCOSE: 72 mg/dL (ref 70–99)
Potassium: 3.8 mmol/L (ref 3.5–5.1)
Sodium: 144 mmol/L (ref 135–145)

## 2018-09-22 NOTE — Progress Notes (Signed)
Daily Progress Note   Patient Name: Vincent Miranda       Date: 09/22/2018 DOB: [birth year]  Age: 84 y.o. MRN#: [id number] Attending Physician: Lucious Groves, DO Primary Care Physician: Isabelle Course, MD Admit Date: 09/17/2018  Reason for Consultation/Follow-up: Establishing goals of care  Subjective: Patient appears acutely delirious.  He is confused and thought nurse was his wife.  He also appears to have worsening right upper extremity edema today.  Length of Stay: 5  Current Medications: Scheduled Meds:  . allopurinol  200 mg Oral Daily  . aspirin EC  81 mg Oral Daily  . cephALEXin  500 mg Oral BID  . doxycycline  100 mg Oral BID  . heparin  5,000 Units Subcutaneous Q8H  . mouth rinse  15 mL Mouth Rinse BID  . mirtazapine  7.5 mg Oral QHS  . multivitamin with minerals  1 tablet Oral Daily  . mupirocin ointment  1 application Nasal BID  . pneumococcal 23 valent vaccine  0.5 mL Intramuscular Tomorrow-1000  . silver sulfADIAZINE   Topical Daily    Continuous Infusions: . fluconazole (DIFLUCAN) IV 200 mg (09/21/18 2120)    PRN Meds: acetaminophen **OR** acetaminophen, polyethylene glycol  Physical Exam          Vital Signs: BP 117/73 (BP Location: Left Arm)   Pulse (!) 102   Temp (!) 97.3 F (36.3 C) (Oral)   Resp 20   Ht 5' 11"  (1.803 m)   Wt 68.6 kg   SpO2 100%   BMI 21.10 kg/m  SpO2: SpO2: 100 % O2 Device: O2 Device: Nasal Cannula O2 Flow Rate: O2 Flow Rate (L/min): 1.5 L/min  Intake/output summary: No intake or output data in the 24 hours ending 09/22/18 1313 LBM: Last BM Date: 09/22/18 Baseline Weight: Weight: 72.7 kg Most recent weight: Weight: 68.6 kg       Palliative Assessment/Data:      Patient Active Problem List   Diagnosis Date Noted   . Palliative care encounter   . Dysphagia 09/18/2018  . Thrush 09/18/2018  . Hypomagnesemia 09/18/2018  . Hypernatremia 09/17/2018  . High anion gap metabolic acidosis 83/33/8329  . Malnutrition of moderate degree 08/27/2018  . Non-healing wound of lower extremity 08/26/2018  . Chronic skin ulcer with fat layer exposed ([place]) 07/11/2017  .  AKI (acute kidney injury) ([place]) 04/18/2017  . Chronic osteomyelitis ([place]) 12/09/2016  . Rash and nonspecific skin eruption 09/08/2016  . Abnormal urinalysis 09/08/2016  . Paroxysmal atrial fibrillation ([place]) 03/01/2016  . Urinary retention 02/15/2016  . Gout 02/03/2016  . PVD (peripheral vascular disease) ([place]) 03/28/2015  . Anemia of chronic disease   . CKD (chronic kidney disease), stage III ([place]) 10/18/2013  . Essential hypertension   . Protein-calorie malnutrition, severe ([place]) 08/07/2013    Palliative Care Assessment & Plan   Patient Profile: 84 y.o. male  with past medical history of chronic non-healing wounds to bilateral feet with chronic osteomyelitis, A. fib, CKD 3, PVD, history of CHF, dysphasia, who was recently hospitalized 08/26/2018 to 08/30/2018 with sepsis from nonhealing foot wounds.  He was discharged on oral antibiotics.  Patient now readmitted on 09/17/2018 with progressive weakness, hypertension, and hyponatremia secondary to poor oral intake.  Patient is also being treated for oral thrush.  Palliative care was asked to consult to help discuss goals of care.   Assessment: Patient appears acutely delirious with noted right upper extremity edema.  He might benefit from work-up including upper extremity Doppler.  Patient was also started on mirtazapine last night, which could have precipitated the delirium.  Would suggest holding mirtazapine as a precaution.  There really are not other prescription appetite stimulants from which patient would likely benefit.  His prognosis seems poor.  I met again with patient's wife.  We  discussed his poor prognosis.  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  She clarifies it is been over a year since he was last able to do that.  I told her I thought he was unlikely to return to that previous functional baseline.  She seemed to accept this information.  She says she is not sure what level of functioning patient would find acceptable for his quality of life.    We again discussed his possible future clinical trajectory.  I explained that he could be approaching end of life if he is not able to eat and drink enough to maintain.  Together we explored the option of artificial nutrition, but I explained that this would be unlikely to improve his quality of life nor would it stop his decline.  We again talked about patient's CODE STATUS.  Wife agrees that it is an issue that needs to be clarified.  However, she says she is not prepared to make decisions today.  She wants to speak with patient about decision-making when he is able to participate.  Recommendations/Plan:  Continue supportive care  Consider work-up of right upper extremity edema  Consider discontinuation of mirtazapine due to acute delirium  Wife will need more support and continued discussions regarding goals  Wife would like to take him home when medically appropriate  Would recommend home health and community palliative care    Thank you for allowing the Palliative Medicine Team to assist in the care of this patient.   Time In: 1300 Time Out: 1330 Total Time 30 minutes Prolonged Time Billed  no      Greater than 50%  of this time was spent counseling and coordinating care related to the above assessment and plan.  Irean Hong, NP  Please contact Palliative Medicine Team phone at [phone] for questions and concerns.

## 2018-09-22 NOTE — Evaluation (Signed)
Occupational Therapy Evaluation Patient Details Name: Vincent Miranda MRN: [id number] DOB: 08-03-1925 Today's Date: 09/22/2018    History of Present Illness Pt is a 84 y/o male admitted secondary to generalized weakness and dysphagia. Pt with chronic LE wounds, refusal of amputation previously. PMH including but not limited to chronic osteomyelitis and lower extremity wounds,PVD, CKD, HTN, a-fib.   Clinical Impression   Upon arrival, pt awake and supine in bed with wife at bedside. Pt's wife reporting that he walks with rollator and sometimes needs assistance with ADLs at home PTA. Pt currently requiring Max-Total A for ADLs and Total A for bed mobility. Pt presenting with poor strength, balance, activity tolerance, and cognition; pt only oriented to self and wife. Pt will require further acute OT to facilitate safe dc. Recommend dc to SNF for further acute OT to optimize safety, increase occupational performance, and decrease caregiver burden. Unsure if family aware of pt deficits and caregiver burden that dc to home would entail. Pt and wife requesting dc home and will require HHOT, HHPT, HH aide, and appropriate DME including 3N1, w/c, hospital bed, and hoyer lift.     Follow Up Recommendations  SNF;Home health OT;Supervision/Assistance - 24 hour;Other (comment)(Family and pt declining SNF)    Equipment Recommendations  Hospital bed;3 in 1 bedside commode;Wheelchair (measurements OT);Wheelchair cushion (measurements OT);Other (comment)(air mattress; Hoyer lift)    Recommendations for [employer] PT consult     Precautions / Restrictions Precautions Precautions: Fall Restrictions Weight Bearing Restrictions: No      Mobility Bed Mobility Overal bed mobility: Needs Assistance Bed Mobility: Supine to Sit;Sit to Supine     Supine to sit: Total assist Sit to supine: Total assist   General bed mobility comments: Total A for bed mobility adn not achieving sitting at EOB due to  pt limiting behavior. Pt's wife reporting that yesterday she assisted pt to sit at EOB and "it wasn't hard. He just did it." Unsure of reability of information  Transfers                 General transfer comment: Declined    Balance                                           ADL either performed or assessed with clinical judgement   ADL Overall ADL's : Needs assistance/impaired                                       General ADL Comments: Pt requiring Max-Total A for ADLs and bed mobility. Presenting with poor strength, balance, and cognition. Self limiting behavior noted.      Vision         Perception     Praxis      Pertinent Vitals/Pain Pain Assessment: Faces Faces Pain Scale: Hurts little more Pain Location: feet with movement Pain Descriptors / Indicators: Sore Pain Intervention(s): Monitored during session;Limited activity within patient's tolerance;Repositioned     Hand Dominance Left(uses both hands)   Extremity/Trunk Assessment Upper Extremity Assessment Upper Extremity Assessment: RUE deficits/detail;LUE deficits/detail RUE Deficits / Details: AAROM limited shoulder flexion with strength 3-/5 shoulder flexion, elbow flexion 3+/5 LUE Deficits / Details: AAROM limited shoulder flexion with strength 2/5 shoulder flexion, elbow flexion 3+/5   Lower Extremity Assessment Lower Extremity  Assessment: Defer to PT evaluation RLE Deficits / Details: wraps covering feet and ankles, AAROM WFL, strength hip flexion 3-/5, knee extension 4/5 LLE Deficits / Details: wraps covering feet and ankles, AAROM WFL, strength hip flexion 2/5, knee extension 4/5   Cervical / Trunk Assessment Cervical / Trunk Assessment: Kyphotic   Communication Communication Communication: HOH   Cognition Arousal/Alertness: Awake/alert Behavior During Therapy: Flat affect Overall Cognitive Status: Impaired/Different from baseline Area of Impairment:  Problem solving;Orientation;Attention;Memory;Following commands;Safety/judgement;Awareness                 Orientation Level: Disoriented to;Place;Time;Situation Current Attention Level: Sustained Memory: Decreased short-term memory Following Commands: Follows one step commands inconsistently;Follows one step commands with increased time Safety/Judgement: Decreased awareness of deficits;Decreased awareness of safety Awareness: Intellectual Problem Solving: Slow processing;Decreased initiation General Comments: Pt not oriented to place, time, and situation. Poor following of commands and self limiting behavior.    General Comments  Wife present throughout session    Exercises     Shoulder Instructions      Home Living Family/patient expects to be discharged to:: Private residence Living Arrangements: Spouse/significant other Available Help at Discharge: Family;Available PRN/intermittently Type of Home: House Home Access: Stairs to enter [employer] of Steps: 1 Entrance Stairs-Rails: None Home Layout: One level     Bathroom Shower/Tub: Chief Strategy Officer: Standard     Home Equipment: Environmental consultant - 4 wheels;Tub bench          Prior Functioning/Environment Level of Independence: Needs assistance  Gait / Transfers Assistance Needed: ambulates with rollator ADL's / Homemaking Assistance Needed: requires some assistance with bathing/dressing from wife            OT Problem List: Decreased strength;Decreased range of motion;Decreased activity tolerance;Impaired balance (sitting and/or standing);Decreased cognition;Decreased safety awareness;Decreased knowledge of use of DME or AE;Decreased knowledge of precautions;Impaired UE functional use;Pain      OT Treatment/Interventions: Self-care/ADL training;Therapeutic exercise;Energy conservation;DME and/or AE instruction;Therapeutic activities;Patient/family education    OT Goals(Current goals can  be found in the care plan section) Acute Rehab OT Goals Patient Stated Goal: to rest OT Goal Formulation: With patient/family Time For Goal Achievement: 10/06/18 Potential to Achieve Goals: Good  OT Frequency: Min 2X/week   Barriers to D/C:            Co-evaluation              AM-PAC PT "6 Clicks" Daily Activity     Outcome Measure Help from another person eating meals?: A Lot Help from another person taking care of personal grooming?: A Lot Help from another person toileting, which includes using toliet, bedpan, or urinal?: Total Help from another person bathing (including washing, rinsing, drying)?: Total Help from another person to put on and taking off regular upper body clothing?: A Lot Help from another person to put on and taking off regular lower body clothing?: Total 6 Click Score: 9   End of Session Nurse Communication: Mobility status;Other (comment);Need for lift equipment(SpO2 98% on RA; bleeding at right ankle)  Activity Tolerance: Patient limited by fatigue;Patient limited by pain Patient left: in bed;with call bell/phone within reach;with bed alarm set;with family/visitor present  OT Visit Diagnosis: Unsteadiness on feet (R26.81);Other abnormalities of gait and mobility (R26.89);Muscle weakness (generalized) (M62.81);Pain Pain - Right/Left: (Bilateral) Pain - part of body: Leg                Time: 1027-2536 OT Time Calculation (min): 23 min Charges:  OT General Charges $  OT Visit: 1 Visit OT Evaluation $OT Eval Moderate Complexity: 1 Mod OT Treatments $Self Care/Home Management : 8-22 mins  Viraat Vanpatten MSOT, OTR/L Acute Rehab Pager: [phone] Office: [phone]  Theodoro Grist Addie Alonge 09/22/2018, 3:42 PM

## 2018-09-22 NOTE — Progress Notes (Signed)
Internal Medicine Attending:   I saw and examined the patient. I reviewed the resident's note and I agree with the resident's findings and plan as documented in the resident's note.  Appreciate palliative consult.  He is still not taking in much oral intake, he currently requires IVF just for free water losses thus will surely be back in the hospital quickly. Agree with trial of mirtazipine but untilmately this is a very poor prognosis.

## 2018-09-22 NOTE — Progress Notes (Signed)
Pt placed on air over lay mattress. Pt refused his meds this morning. MD aware. Will cont to monitor pt.

## 2018-09-22 NOTE — Progress Notes (Signed)
Pt has had a persistent dry cough after he had breakfast. Wife at bedside.  Lungs diminished. Pt refused his medications this morning. IV team came to assess Right arm that is swollen. Md paged. Will cont to monitor pt.

## 2018-09-22 NOTE — Progress Notes (Signed)
   Subjective: Patient is doing well this AM but is cold. He tells [place] that he did eat some of his dinner yesterday but continues to have poor PO intake overall. He slept okay without any issues. Wife is not in the room this AM but will be up later this afternoon. Discussed the plan to aim for discharge in the next 1-2 days. All questions and concerns addressed.   Objective: Vital signs in last 24 hours: Vitals:   09/21/18 0620 09/21/18 1313 09/21/18 1925 09/22/18 0700  BP: 102/71 116/76 115/73 117/73  Pulse: 97 99 (!) 102 (!) 102  Resp: 19 (!) 35 18 20  Temp: (!) 97.5 F (36.4 C) (!) 97.2 F (36.2 C) 98.1 F (36.7 C) (!) 97.3 F (36.3 C)  TempSrc: Oral Axillary Oral Oral  SpO2:  97% 98% 100%  Weight:    68.6 kg  Height:       General: Elderly male in no acute distress HENT: Moist mucus membranes Pulm: Good air movement with no wheezing or crackles  CV: RRR, no murmurs, no rubs  Extremities: No LE edema, bandages of the bilateral LEs are clean and dry   Assessment/Plan:  Vincent Miranda is a 84 y.o male with chronic bilateral osteomyelitis on chronic suppressive antibiotic therapy, CKD Stage IV, CVA/PVD, and HTN who presented to the ED on 09/17/18 with encephalopathy and decreased PO intake subsequently found to have acute on chronic kidney failure and hypernatremia. On PE he was found to have thrush which was felt to be contributing to his decreased PO intake. He has been treated with IV fluconazole and hydrated with D5W.   GOC - Palliative evaluated the patient on 11/2 and discussed GOC with the patient and his wife. We appreciate palliatives help in this case. Currently the patient and his wife would like to continue with current care and remain a full code.  - Outpatient home health  - Outpatient palliative care consult  - Continue GOC discussions.   Severe Malnutrition in Context of Chronic Illness  - Nutrition consult recommending daily multivitamin and magic cups TID  with meals  - Continuing goals of care discussion and encouraging PO intake  - Started on Mirtazapine 7.5 mg QHS for appetite stimulation   Esophageal dysphagia multifactorial, due to candidal esophagitis and possibly functional limitations from prior CVA - Discomfort with swallowing has resolved but patient continues to have poor appetite and PO intake  - Continuing IV Fluconazole for 10 days of treatment (Day #5) - Dysphagia diet #3  Chronic Bilateral Osteomyelitis  - Wounds without purulent discharge and clean wound beds, improved compared to prior  - Continue wet-to-dry dressings with silver sulfadiazine  - Continue doxycycline and keflex   HFpEF, Stable  - No signs or symptoms of decompensation  - Holding home lasix - Daily volume status assessments   NAGMA  - CO2 at 19 today. This is likely 2/2 his CKD. Bicarb replacement has been shown to decrease the rate of progression of CKD however, I am reluctant to start more medication in this patient. We will continue to monitor.   AKI on CKD Stage IV 2/2 pre-renal etiology. Resolved Hypernatremia 2/2 decreased PO intake. Resolved - High probability of recurrence given patients poor PO intake  Dispo: Anticipated discharge in approximately 1-2 days with home health and palliative care.    Levora Dredge, MD 09/22/2018, 10:37 AM

## 2018-09-23 ENCOUNTER — Encounter (HOSPITAL_COMMUNITY): Payer: Medicare Other

## 2018-09-23 DIAGNOSIS — R1310 Dysphagia, unspecified: Secondary | ICD-10-CM

## 2018-09-23 DIAGNOSIS — M8660 Other chronic osteomyelitis, unspecified site: Secondary | ICD-10-CM

## 2018-09-23 DIAGNOSIS — L98499 Non-pressure chronic ulcer of skin of other sites with unspecified severity: Secondary | ICD-10-CM

## 2018-09-23 DIAGNOSIS — Z515 Encounter for palliative care: Secondary | ICD-10-CM

## 2018-09-23 DIAGNOSIS — E43 Unspecified severe protein-calorie malnutrition: Secondary | ICD-10-CM

## 2018-09-23 DIAGNOSIS — R627 Adult failure to thrive: Secondary | ICD-10-CM

## 2018-09-23 LAB — BASIC METABOLIC PANEL
ANION GAP: 2 — AB (ref 5–15)
BUN: 54 mg/dL — ABNORMAL HIGH (ref 8–23)
CHLORIDE: 124 mmol/L — AB (ref 98–111)
CO2: 20 mmol/L — ABNORMAL LOW (ref 22–32)
Calcium: 9.4 mg/dL (ref 8.9–10.3)
Creatinine, Ser: 2.61 mg/dL — ABNORMAL HIGH (ref 0.61–1.24)
GFR calc non Af Amer: 20 mL/min — ABNORMAL LOW (ref >60)
GFR, EST AFRICAN AMERICAN: 23 mL/min — AB (ref >60)
Glucose, Bld: 78 mg/dL (ref 70–99)
Potassium: 3.7 mmol/L (ref 3.5–5.1)
Sodium: 146 mmol/L — ABNORMAL HIGH (ref 135–145)

## 2018-09-23 MED ORDER — DEXTROSE 5 % IV SOLN
INTRAVENOUS | Status: DC
  Administered 2018-09-23 – 2018-09-24 (×2): via INTRAVENOUS

## 2018-09-23 NOTE — Progress Notes (Signed)
Patient ID: Vincent Miranda, male   DOB: July 03, 1925, 84 y.o.   MRN: [id number]  This NP visited patient at the bedside as a follow up for palliative medicine needs and emotional support.  Patient is confused and appears generally uncomfortable.  He is cachectic.  Wife at bedside.  Continued conversation regarding current medical situation; diagnosis, prognosis, concept of limitations of medical interventions within the context of when a body begins to  fails to thrive in spite of attempts at life prolonging measures..   We discussed human mortality and natural trajectory at EOL.   Family face treatment option decisions, advanced directive decisions and anticipatory care needs.    MOST form introduced and Hard Choices booklet left for review  Discussed with wife the importance of continued conversation with her  family and the  medical providers regarding overall plan of care and treatment options,  ensuring decisions are within the context of the patients values and GOCs.  Questions and concerns addressed   Discussed with Dr Mikey Bussing  Total time spent on the unit was 35 minutes  Greater than 50% of the time was spent in counseling and coordination of care  Lorinda Creed NP  Palliative Medicine Team Team Phone # [phone] Pager [phone]

## 2018-09-23 NOTE — Progress Notes (Signed)
Physical Therapy Treatment Patient Details Name: Vincent Miranda MRN: [id number] DOB: August 08, 1925 Today's Date: 09/23/2018    History of Present Illness Pt is a 84 y/o male admitted secondary to generalized weakness and dysphagia. Pt with chronic LE wounds, refusal of amputation previously. PMH including but not limited to chronic osteomyelitis and lower extremity wounds,PVD, CKD, HTN, a-fib.    PT Comments    Pt received in bed. Pleasant upon entry and agreeable to sitting EOB. Upon initiation of mobility, pt with c/o LE pain requiring immediate return to supine. Pt with escalating agitation with attempts at further mobility/exercise. Pt confused and perseverating on need to plant seeds in his garden. Pt difficult to redirect and keep on task. Per chart, wife is declining SNF. If pt discharges home, he will need HHPT, hospital bed, wheelchair, air mattress, and hoyer lift.    Follow Up Recommendations  SNF;Supervision/Assistance - 24 hour     Equipment Recommendations  None recommended by PT    Recommendations for Other Services       Precautions / Restrictions Precautions Precautions: Fall    Mobility  Bed Mobility Overal bed mobility: Needs Assistance Bed Mobility: Supine to Sit;Sit to Supine     Supine to sit: Total assist Sit to supine: Total assist   General bed mobility comments: Upon sitting EOB pt with c/o BLE pain requiring immediate return to supine.  Transfers                 General transfer comment: Declined  Ambulation/Gait             General Gait Details: unable    Stairs             Wheelchair Mobility    Modified Rankin (Stroke Patients Only)       Balance                                            Cognition Arousal/Alertness: Awake/alert Behavior During Therapy: Agitated Overall Cognitive Status: Impaired/Different from baseline Area of Impairment: Problem  solving;Orientation;Attention;Memory;Following commands;Safety/judgement;Awareness                 Orientation Level: Disoriented to;Place;Time;Situation Current Attention Level: Sustained Memory: Decreased short-term memory Following Commands: Follows one step commands inconsistently;Follows one step commands with increased time Safety/Judgement: Decreased awareness of deficits;Decreased awareness of safety Awareness: Intellectual Problem Solving: Slow processing;Decreased initiation General Comments: confused and agitated. NT reports pt does better when his wife is here. Perseverating on need to plant his garden.      Exercises      General Comments        Pertinent Vitals/Pain Pain Assessment: Faces Faces Pain Scale: Hurts even more Pain Location: bilat LE with movement Pain Descriptors / Indicators: Grimacing;Moaning;Guarding Pain Intervention(s): Limited activity within patient's tolerance;Monitored during session;Repositioned    Home Living                      Prior Function            PT Goals (current goals can now be found in the care plan section) Acute Rehab PT Goals Patient Stated Goal: to rest PT Goal Formulation: With patient Time For Goal Achievement: 09/27/18 Potential to Achieve Goals: Poor    Frequency    Min 2X/week      PT Plan  Co-evaluation              AM-PAC PT "6 Clicks" Daily Activity  Outcome Measure  Difficulty turning over in bed (including adjusting bedclothes, sheets and blankets)?: A Lot Difficulty moving from lying on back to sitting on the side of the bed? : Unable Difficulty sitting down on and standing up from a chair with arms (e.g., wheelchair, bedside commode, etc,.)?: Unable Help needed moving to and from a bed to chair (including a wheelchair)?: Total Help needed walking in hospital room?: Total Help needed climbing 3-5 steps with a railing? : Total 6 Click Score: 7    End of Session    Activity Tolerance: Patient limited by pain Patient left: in bed;with call bell/phone within reach;with bed alarm set Nurse Communication: Mobility status PT Visit Diagnosis: Other abnormalities of gait and mobility (R26.89);Muscle weakness (generalized) (M62.81)     Time: 1610-9604 PT Time Calculation (min) (ACUTE ONLY): 11 min  Charges:  $Therapeutic Activity: 8-22 mins                     Aida Raider, PT  Office # [phone] Pager [phone]    Ilda Foil 09/23/2018, 9:56 AM

## 2018-09-23 NOTE — Progress Notes (Signed)
Subjective: Patient was seen and evaluated at bedside today.  Wife is not present in the room.  Patient is not completely oriented. He verbally communicate but his responses are not completely clear.  Objective:  Vital signs in last 24 hours: Vitals:   09/21/18 1925 09/22/18 0700 09/22/18 1513 09/22/18 1944  BP: 115/73 117/73 111/66 107/83  Pulse: (!) 102 (!) 102 (!) 102 (!) 107  Resp: 18 20 20 17   Temp: 98.1 F (36.7 C) (!) 97.3 F (36.3 C) 97.6 F (36.4 C) 98.4 F (36.9 C)  TempSrc: Oral Oral Oral Oral  SpO2: 98% 100% 100% 100%  Weight:  68.6 kg    Height:       Physical exam: General: Elderly gentleman, lying on his bed in no acute distress.  Looks cachectic. CV: RRR, no murmur Lungs: CTA bilaterally, no rale, no wheeze Abdomen: Soft, BS are present, no tenderness, no distention Extremities: Distal lower extremities are wrapped.  Dressing is clean.  No lower extremity edema Neurology: Patient is alert, oriented to some people but not place  BMP Latest Ref Rng & Units 09/23/2018 09/22/2018 09/21/2018  Glucose 70 - 99 mg/dL 78 72 90  BUN 8 - 23 mg/dL 62(X) 52(W) 41(L)  Creatinine 0.61 - 1.24 mg/dL 2.44(W) 1.02(V) 2.53(G)  BUN/Creat Ratio 10 - 24 - - -  Sodium 135 - 145 mmol/L 146(H) 144 147(H)  Potassium 3.5 - 5.1 mmol/L 3.7 3.8 3.9  Chloride 98 - 111 mmol/L 124(H) 120(H) 123(H)  CO2 22 - 32 mmol/L 20(L) 19(L) 18(L)  Calcium 8.9 - 10.3 mg/dL 9.4 9.1 9.2   Assessment/Plan:  Principal Problem:   Protein-calorie malnutrition, severe (HCC) Active Problems:   Essential hypertension   CKD (chronic kidney disease), stage III (HCC)   Chronic osteomyelitis (HCC)   AKI (acute kidney injury) (HCC)   Hypernatremia   High anion gap metabolic acidosis   Dysphagia   Thrush   Hypomagnesemia   Palliative care encounter  Vincent Miranda is a 84 y.o male with chronic bilateral osteomyelitis on chronic suppressive antibiotic therapy, CKD Stage IV, CVA/PVD, and HTN who presented to  the ED on 09/17/18 with weakness and decreased PO intake subsequently found to have acute on chronic kidney failure and hypernatremia. On PE he was found to have thrush which was felt to be contributing to his decreased PO intake. He has been treated with IV fluconazole and hydrated with D5W.   Goal of care: Palliative consulted and visited the patient yesterday as well as today.  Decided to transition to home with home health probably tomorrow    Poor p.o. intake, severe malnutrition setting of chronic illness, with height per natremia Patient improved prognosis due to poor p.o. intake despite improvement on dysphagia. -Continue with multivitamin and Magic cups 3 times daily with meals from nutrition consult -IV hydration   Multifactorial esophageal dysphagia, likely 2/2 candidal esophagitis, possibly functional swallowing limitations due to prior CVA Oropharyngeal thrush improved, patient still has poor appetite.  -Continue IV fluconazole (day 6) for total of 10 days -Continue dysphagia 3 diet  Chronic bilateral osteomyelitis with nonhealing ulcers: Stable.  IV antibiotic switched to p.o.  wound dressing is intact  -Continue wet-to-dry dressings with silver sulfadiazine -Continue Keflex and doxycycline  HFpEF, Stable, without signs or symptoms of decompensation  - Holding home lasix - Daily volume status assessments  AKI on CKD Stage IV 2/2 pre-renal etiology: Resolved  Hypernatremia 2/2 decreased PO intake. Na 146 today -D5, 75 ml/hx 24 h -  BMP daily   Diet: Dysphagia 3 IV fluid: D5, 75 ml/hx 24 h VTE ppx: Sub Q Heparin Code status: Full  Dispo: Anticipated discharge tomorrow, with transition to home home health service  Chevis Pretty, MD 09/23/2018, 5:21 AM Pager: [phone]

## 2018-09-24 ENCOUNTER — Encounter (HOSPITAL_COMMUNITY): Payer: Medicare Other

## 2018-09-24 ENCOUNTER — Inpatient Hospital Stay (HOSPITAL_COMMUNITY): Payer: Medicare Other

## 2018-09-24 DIAGNOSIS — M7989 Other specified soft tissue disorders: Secondary | ICD-10-CM

## 2018-09-24 LAB — BASIC METABOLIC PANEL
Anion gap: 2 — ABNORMAL LOW (ref 5–15)
BUN: 48 mg/dL — AB (ref 8–23)
CO2: 21 mmol/L — AB (ref 22–32)
Calcium: 9.4 mg/dL (ref 8.9–10.3)
Chloride: 123 mmol/L — ABNORMAL HIGH (ref 98–111)
Creatinine, Ser: 2.53 mg/dL — ABNORMAL HIGH (ref 0.61–1.24)
GFR calc Af Amer: 24 mL/min — ABNORMAL LOW (ref >60)
GFR calc non Af Amer: 21 mL/min — ABNORMAL LOW (ref >60)
GLUCOSE: 99 mg/dL (ref 70–99)
Potassium: 3.5 mmol/L (ref 3.5–5.1)
Sodium: 146 mmol/L — ABNORMAL HIGH (ref 135–145)

## 2018-09-24 NOTE — Progress Notes (Signed)
Pt right arm became increasingly swollen and weeping due to infiltrated IV. IV removed and pt right arm elevated with ice pack applied.

## 2018-09-24 NOTE — Progress Notes (Signed)
Subjective: Patient was seen and evaluated at bedside on morning rounds. No acute events overnight.  He was mostly asleep during interview, briefly answers questions.  Mentions he feels good.  Does not have any complaint.  Wife is in the room.  We discussed plan of care with her and explained all possible options of continuing the care.  All of her questions and concerns were addressed.  She understands the situation.   Objective:  Vital signs in last 24 hours: Vitals:   09/23/18 2352 09/24/18 0333 09/24/18 0802 09/24/18 1325  BP: 118/68 (!) 141/82 127/68 111/64  Pulse: 99 96  89  Resp:  19    Temp:  98.4 F (36.9 C)    TempSrc:  Axillary    SpO2:  96%  94%  Weight:  65.9 kg    Height:       Physical exam: Patient looks malnourished and cachectic. Cardiovascular: RRR, no murmur Lungs: CTA bilaterally, no rale, no wheeze Abdomen: Soft, nontender, with no distention, BS are present Extremities: Feet are wrapped bilaterally, dressing is clean: There is no lower extremity edema Neurology: Patient is alert, but not completely oriented Assessment/Plan:  Principal Problem:   Protein-calorie malnutrition, severe (HCC) Active Problems:   Essential hypertension   CKD (chronic kidney disease), stage III (HCC)   Chronic osteomyelitis (HCC)   AKI (acute kidney injury) (HCC)   Hypernatremia   High anion gap metabolic acidosis   Dysphagia   Thrush   Hypomagnesemia   Palliative care encounter  Mr. Achord is a 84 y.o male with chronic bilateral osteomyelitis on chronic suppressive antibiotic therapy, CKD Stage IV, CVA/PVD, and HTN who presented to the ED on 09/17/18 with weakness and decreased PO intake subsequently found to have acute on chronic kidney failure and hypernatremia. On PE he was found to have thrush which was felt to be contributing to his decreased PO intake. He has been treated with IV fluconazole and hydrated with D5W.   Poor p.o. intake, severe malnutrition  setting of chronic illness, with height per natremia.  No improvement on his p.o. Intake.  -Continue with multivitamin and Magic cups 3 times daily with meals from nutrition consult -IV hydration   Goal of care: Discussed with wife again today. Planned to transition to home tomorrow with home health.  Multifactorial esophageal dysphagia, likely 2/2 candidal esophagitis, possibly functional swallowing limitations due to prior CVA Oropharyngeal thrush improved, patient still has poor appetite.  -Continue IV fluconazole (day 7) for total of 10 days -Continue dysphagia 3 diet  Chronic bilateral osteomyelitis with nonhealing ulcers: Stable.  IV antibiotic switched to p.o.  wound dressing is intact Wife reports some drainage from the wound but not more than normal.  -Continue wet-to-dry dressings with silver sulfadiazine -Continue p.o. Keflex and doxycycline  HFpEF, Stable, without signs or symptoms of decompensation  - Holding home lasix - Daily volume status assessments  AKI on CKD Stage IV 2/2 pre-renal etiology: Resolved  Hypernatremia 2/2 decreased PO intake. Na 146 today Was on D5, 75 ml/hx 24 h, increased to 125 mL/h today but stopped due to filtrated IV. -BMP daily   Right upper extremity swelling: Nonpitting edema, nontender, pulses are palpable. Upper extremity Doppler performed and negative for DVT Likely secondary to infiltrated IV -Remove IV and stop IV fluid -Elevate right arm   Diet: Dysphagia 3 IV fluid: Stopped VTE ppx: Sub Q Heparin Code status: Full  Dispo: Anticipated discharge in approximately 1 day  Chevis Pretty, MD 09/24/2018, 5:16  PM Pager: [phone]

## 2018-09-24 NOTE — Plan of Care (Signed)
Pt resting quietly in bed, irritable when awakened for assessment & dressing change. Pt attempted to kick at staff when initiating wound cleansing. Pt took oral abx but tried to hit staff when hepain SQ offered. Pt currently resting with eyes closed. NAD. No acute issues at this time .

## 2018-09-24 NOTE — Progress Notes (Signed)
Occupational Therapy Treatment Patient Details Name: Vincent Miranda MRN: [id number] DOB: [birth year] Today's Date: 09/24/2018    History of present illness Pt is a 84 y/o male admitted secondary to generalized weakness and dysphagia. Pt with chronic LE wounds, refusal of amputation previously. PMH including but not limited to chronic osteomyelitis and lower extremity wounds,PVD, CKD, HTN, a-fib.   OT comments  Pt very limited with participation and agitated at times during session. Pt repositioned in chair in bed to help with respiratory level, arousal and R UE position for edema management. Pt with no response to tactile input the the R UE.  Pt remains bed level treatment due to participation limited. No family present and will attempt to see patient next session with family present to see if it helps cooperation.   Follow Up Recommendations  SNF;Home health OT;Supervision/Assistance - 24 hour;Other (comment)    Equipment Recommendations  Hospital bed;3 in 1 bedside commode;Wheelchair (measurements OT);Wheelchair cushion (measurements OT);Other (comment)    Recommendations for Other Services PT consult    Precautions / Restrictions         Mobility Bed Mobility Overal bed mobility: Needs Assistance Bed Mobility: Supine to Sit     Supine to sit: Total assist;+2 for physical assistance     General bed mobility comments: pt log rolled R and l total (A) with skin intact on buttock. pt will need frequent position changes due to risk for skin break down  Transfers                 General transfer comment: unable to attempt due to arousal and cooperation    Balance                                           ADL either performed or assessed with clinical judgement   ADL Overall ADL's : Needs assistance/impaired   Eating/Feeding Details (indicate cue type and reason): no po intake. OT offering with reposition arousal. pt states "if i wanted it i  would ask for it" Grooming: Wash/dry face;Total assistance;Bed level Grooming Details (indicate cue type and reason): closes eyes tightly but otherwise no response                               General ADL Comments: pt repositioned off R hip to midline with HOB elevated to reposition. BIL LE floated on pillows, R UE on pillow for elevation     Vision       Perception     Praxis      Cognition Arousal/Alertness: Lethargic Behavior During Therapy: Flat affect;Agitated Overall Cognitive Status: Impaired/Different from baseline                                 General Comments: pt with minimal arousal even with reposition. pt waving arms toward therapist in response to tactile input. pt seems startled by therapist even with verbal cues. pt with minimla visual attention to therapist.         Exercises     Shoulder Instructions       General Comments bil LE with dressing so heels floated with pillow    Pertinent Vitals/ Pain       Pain Assessment: Faces Faces Pain Scale: Hurts little more  Pain Descriptors / Indicators: Grimacing Pain Intervention(s): Repositioned  Home Living                                          Prior Functioning/Environment              Frequency  Min 2X/week        Progress Toward Goals  OT Goals(current goals can now be found in the care plan section)  Progress towards OT goals: Not progressing toward goals - comment  Acute Rehab OT Goals Patient Stated Goal: none stated OT Goal Formulation: With patient/family Time For Goal Achievement: 10/06/18 Potential to Achieve Goals: Good ADL Goals Pt Will Perform Grooming: with min assist;sitting Pt Will Perform Upper Body Dressing: with min assist;sitting Pt Will Perform Lower Body Dressing: with mod assist;sit to/from stand Pt Will Transfer to Toilet: with mod assist;stand pivot transfer;bedside commode Additional ADL Goal #1: Pt will perform  bed mobility with Mod A in preparation for ADLs  Plan Discharge plan remains appropriate    Co-evaluation                 AM-PAC PT "6 Clicks" Daily Activity     Outcome Measure   Help from another person eating meals?: Total Help from another person taking care of personal grooming?: Total Help from another person toileting, which includes using toliet, bedpan, or urinal?: Total Help from another person bathing (including washing, rinsing, drying)?: Total Help from another person to put on and taking off regular upper body clothing?: Total Help from another person to put on and taking off regular lower body clothing?: Total 6 Click Score: 6    End of Session Equipment Utilized During Treatment: Oxygen  OT Visit Diagnosis: Unsteadiness on feet (R26.81);Other abnormalities of gait and mobility (R26.89);Muscle weakness (generalized) (M62.81);Pain Pain - part of body: Leg   Activity Tolerance Patient limited by fatigue   Patient Left in bed;with call bell/phone within reach;with bed alarm set   Nurse Communication Mobility status;Precautions;Need for lift equipment        Time: [phone] OT Time Calculation (min): 10 min  Charges: OT General Charges $OT Visit: 1 Visit OT Treatments $Self Care/Home Management : 8-22 mins   Vincent Miranda, OTR/L  Acute Rehabilitation Services Pager: [phone] Office: [phone] .    Vincent Miranda 09/24/2018, 2:46 PM

## 2018-09-24 NOTE — Progress Notes (Signed)
  Date: 09/24/2018  Patient name: Vincent Miranda  Medical record number: [id number]  Date of birth: March 16, 1925   I have seen and evaluated this patient and I have discussed the plan of care with the house staff. Please see their note for complete details. I concur with their findings with the following additions/corrections:   Seen together with the team on rounds this morning, as well as an extensive meeting with him and his wife and Dr. Maryla Morrow this afternoon.  Unfortunately, condition is not improved much.  He continues to eat only minimal amounts and drink small amounts of water.  Sodium is unchanged despite continued administration of D5W, indicating ongoing free water deficit.  Today, his arm became increasingly swollen, likely due to IV infiltration.  Ultrasound was negative for DVT.  His sodium is likely not increased as the D5W appears to be predominantly in his right arm.  We had a follow-up discussion of his overall goals of care.  His wife remains hopeful that he will be able to recover his nutritional intake and return to a functional level where he can achieve some quality of life.  We discussed that I am worried he may not be able to get back to that point.  At this point, she would like to go home with gets maximal home services and see if his home environment and home food will improve his intake.  He will follow in our clinic, although it may be difficult for him to come in for a visit, and we may need to obtain labs from home nursing visits and discussed with her by phone.  I asked her to continue to consider whether the involvement of hospice might provide additional support and maximize the quality of life he has remaining.  Plan for discharge home with services tomorrow morning when we have been able to arrange home services.  I personally spent greater than 35 minutes and performing a history, physical exam, and discussing his plan of care with him and his  family.  Jessy Oto, M.D., Ph.D. 09/24/2018, 6:11 PM

## 2018-09-24 NOTE — Care Management Note (Addendum)
Case Management Note  Patient Details  Name: Vincent Miranda MRN: [id number] Date of Birth: December 16, 1924  Subjective/Objective: Pt presented for malnutrition and weakness. PTA from home with wife. Currently wife works and patient will be alone at times throughout the day. Wife states son comes by during the day sometimes to check on patient. Pt has DME tub bench, shower chair, 2 RW's. Patient sleeps in his own bed- CM did recommend Hospital Bed- wife refused hospital bed. No DME needs at this time.                   Action/Plan: Wife changes the dressings @ home and wanted to utilize [facility] PT/OT, RN, Aide and Child psychotherapist. CM did make referral to [facility] with [facility]. CM received call back from [facility] and he stated [facility] could not accept the patient back. Per Notes [facility] stated that patient needs amputations and patient is not willing to do so, home not sanitary and will not be able to provide services. CM will speak with another agency to see if they will be able to provide additional assistance. Patient/Wife refusing SNF at this time. CM did discuss with wife about personal care services- unable to afford. Wife states with the rate of personal care providers she will need to stop working instead of paying for personal care services. CM awaiting call back from another Home Care Agency to see if they can assist with home care needs.   Expected Discharge Date:  09/21/18               Expected Discharge Plan:  Home w Home Health Services  In-House Referral:  Hospice / Palliative Care(Palliative to Consult)  Discharge planning Services  CM Consult  Post Acute Care Choice:  Home Health Choice offered to:  Patient, Spouse  DME Arranged:  N/A DME Agency:  NA  HH Arranged:  RN, Disease Management, PT, OT, Nurse's Aide, Refused SNF, Social Work [employer] Agency:  [facility]  Status of Service:  Completed, signed off  If discussed at [employer], dates  discussed:    Additional Comments: 1252 09-25-18 Tomi Bamberger, RN,BSN [phone] Plan for transition home today. [facility] to follow up with the family to see if they are interested in Palliative Services. [facility] notified for hospital pick up for 2:00 pm. No further needs from CM at this time.    1008 09-25-18 Tomi Bamberger, RN,BSN [phone] CM did speak with patient/ wife in regards to plan agreeable to [facility] them- No DME needs at this time. Plan for ambulance transport home. No further needs from CM at this time.   7425 09-25-18 Tomi Bamberger, RN,BSN [phone] CM did speak with Kandee Keen with [facility] and they can provide [facility]. SOC to begin within 24-48 hours post transition home. CM will make patient aware of plan of care.  Gala Lewandowsky, RN 09/24/2018, 12:33 PM

## 2018-09-24 NOTE — Progress Notes (Signed)
CSW noted patient's family refusing SNF. RNCM following for home health needs. Please consult CSW if disposition plan changes.  Abigail Butts, LCSWA [phone]

## 2018-09-24 NOTE — Progress Notes (Signed)
Bilateral upper extremity venous duplex has been completed. Negative for DVT.  09/24/18 11:53 AM Olen Cordial RVT

## 2018-09-25 DIAGNOSIS — Z6820 Body mass index (BMI) 20.0-20.9, adult: Secondary | ICD-10-CM

## 2018-09-25 DIAGNOSIS — R531 Weakness: Secondary | ICD-10-CM

## 2018-09-25 LAB — BASIC METABOLIC PANEL
Anion gap: 0 — ABNORMAL LOW (ref 5–15)
BUN: 47 mg/dL — ABNORMAL HIGH (ref 8–23)
CO2: 23 mmol/L (ref 22–32)
Calcium: 9.5 mg/dL (ref 8.9–10.3)
Chloride: 121 mmol/L — ABNORMAL HIGH (ref 98–111)
Creatinine, Ser: 2.47 mg/dL — ABNORMAL HIGH (ref 0.61–1.24)
GFR calc Af Amer: 25 mL/min — ABNORMAL LOW (ref >60)
GFR calc non Af Amer: 21 mL/min — ABNORMAL LOW (ref >60)
Glucose, Bld: 80 mg/dL (ref 70–99)
Potassium: 3.6 mmol/L (ref 3.5–5.1)
Sodium: 144 mmol/L (ref 135–145)

## 2018-09-25 MED ORDER — FLUCONAZOLE 200 MG PO TABS: 200.0000 mg | ORAL_TABLET | Freq: Every day | ORAL | 0 refills | Status: AC

## 2018-09-25 NOTE — Progress Notes (Signed)
Nutrition Follow-up  DOCUMENTATION CODES:   Severe malnutrition in context of chronic illness  INTERVENTION:  -Continue MVI daily  -Hormel Shake once daily, each supplement provides 520kcal and 22g of protein.   -D/c Magic Cups since pt not eating them   NUTRITION DIAGNOSIS:   Severe Malnutrition related to chronic illness(CKD) as evidenced by severe fat depletion, severe muscle depletion, percent weight loss(17% weight loss within 1 month). Ongoing  GOAL:   Patient will meet greater than or equal to 90% of their needs Not met  MONITOR:   PO intake, Supplement acceptance, Labs, Skin  ASSESSMENT:   84 yo male with PMH of HTN, CKD, anemia, chronic osteomyelitis, PVD, A fib who was admitted on 10/29 with progressive dysphagia.  Speech rec 10/30 recommended D3 w/ thin liquids. Pt now on low sodium heart healthy diet as of 11/6.  Poor meal completion (0-20%) except for 60% dinner 11/5.  Visited pt at bedside with wife present.  Wife expecting pt to be discharged today. She says pt eating and swallowing better. Per MD note pt didn't eat much breakfast this morning. Wife says that he is not eating Magic Cups because he is "already full" after she feeds him his meal. Offered to provide them in between meals but wife is resistant.   Wife seems defensive about use of supplements. Says that pt "will not eat any of that" and says Ensure "runs right through him". After mention of alternative supplements she refuses and says she will "do it herself". Says she has him drink V8 juice and foods he likes such as beans. Recommended to wife that she increase foods higher in protein such as ground or soft meats.   Will provide a Hormel Shake in between meals if patient not discharged this afternoon.   Meds: MVI Labs: BUN 47, GFR 25   Diet Order:   Diet Order            DIET DYS 3 Room service appropriate? Yes; Fluid consistency: Thin  Diet effective now              EDUCATION  NEEDS:   No education needs have been identified at this time  Skin:  Skin Integrity Issues:: Diabetic Ulcer Diabetic Ulcer: R leg and foot, L foot  Last BM:  11/3  Height:   Ht Readings from Last 1 Encounters:  09/18/18 5' 11"  (1.803 m)    Weight:   Wt Readings from Last 1 Encounters:  09/25/18 65.9 kg    Ideal Body Weight:  78.2 kg  BMI:  Body mass index is 20.26 kg/m.  Estimated Nutritional Needs:   Kcal:  1800-2000  Protein:  100-115 gm  Fluid:  1.8-2 L    Yurika Pereda, Dietetic Intern [phone]

## 2018-09-25 NOTE — Care Management Important Message (Signed)
Important Message  Patient Details  Name: LAM MCCUBBINS MRN: [id number] Date of Birth: April 28, 1925   Medicare Important Message Given:  Yes    Gala Lewandowsky, RN 09/25/2018, 1:01 PM

## 2018-09-25 NOTE — Discharge Summary (Addendum)
Name: Vincent Miranda MRN: [id number] DOB: 1925-05-11 84 y.o. PCP: Ali Lowe, MD  Date of Admission: 09/17/2018  4:54 PM Date of Discharge: 09/25/2018 Attending Physician: Anne Shutter, MD   Discharge Diagnosis: Generalized weakness likely secondary to poor p.o. Intake and multifactorial dysphagia (likely due to candidal esophagitis and prior CVA) Principal Problem:   Protein-calorie malnutrition, severe (HCC) Active Problems:   Essential hypertension   CKD (chronic kidney disease), stage III (HCC)   Chronic osteomyelitis (HCC)   AKI (acute kidney injury) (HCC)   Hypernatremia   High anion gap metabolic acidosis   Dysphagia   Thrush   Hypomagnesemia   Palliative care encounter   Adult failure to thrive    Discharge Medications: Allergies as of 09/25/2018   No Known Allergies     Medication List    TAKE these medications   allopurinol 100 MG tablet Commonly known as:  ZYLOPRIM Take 2 tablets (200 mg total) by mouth daily.   aspirin EC 81 MG tablet Take 81 mg by mouth daily.   cephALEXin 500 MG capsule Commonly known as:  KEFLEX Take 1 capsule (500 mg total) by mouth 2 (two) times daily.   doxycycline 100 MG tablet Commonly known as:  VIBRA-TABS Take 1 tablet (100 mg total) by mouth 2 (two) times daily.   fluconazole 200 MG tablet Commonly known as:  DIFLUCAN Take 1 tablet (200 mg total) by mouth daily.   furosemide 20 MG tablet Commonly known as:  LASIX TAKE 1 TABLET BY MOUTH ONCE DAILY AS NEEDED IN  ADDITION  TO  40  MG  TABLET  WHEN  THERE  IS  EXTRA  SWELLING What changed:    how much to take  how to take this  when to take this  Another medication with the same name was removed. Continue taking this medication, and follow the directions you see here.   multivitamin with minerals Tabs tablet Take 1 tablet by mouth daily.   nutrition supplement (JUVEN) Pack Take 1 packet by mouth 2 (two) times daily between meals.   Potassium  95 MG Tabs Take 1 tablet (95 mg total) by mouth daily.   silver sulfADIAZINE 1 % cream Commonly known as:  SILVADENE APPLY  CREAM EXTERNALLY TO AFFECTED AREA ONCE DAILY   Zinc 50 MG Tabs Take 50 mg by mouth daily.       Disposition and follow-up:   Vincent Miranda was discharged from [facility] in stable condition.  At the hospital follow up visit please address:  1.  Patient treated with IV fluconazole for 7 days while in hospital, continue with p.o. fluconazole to complete 10 days of treatment.  Improved on discharge.  Please reevaluate and follow-up visit. 2.  Poor p.o. intake, malnutrition with hyponatremia: Poor appetite even after treatment for possible facial Candida.  Encouraging to p.o. intake 2.  Labs / imaging needed at time of follow-up:BMP, CBC  3.  Pending labs/ test needing follow-up:none  Follow-up Appointments: Follow-up Information    [facility] Follow up.   Specialty:  Home Health Services Why:  Registered Nurse, Physical/Occupational Therapy, Aide, Social Worker Contact information: [address] [place] [address] [phone]        [facility] Follow up.   Why:  Palliative Services- Office will call you to see if interested in the services.  Contact information: Palliative Services  [phone]          [facility]  Course by problem list: 1. Vincent Miranda is a 84 y.o male with chronic bilateral osteomyelitis on chronic suppressive antibiotic therapy, CKD Stage IV, CVA/PVD, and HTN who presented to the ED on 09/17/18 with weakness and decreased PO intake subsequently found to have acute on chronic kidney failure and hypernatremia. On exam he was found to have thrush which was felt to be contributing to his decreased PO intake. He was treated with IV fluconazole and hydrated with D5W.Patient clinically improved, however still has poor PO intake. Goal of care was discussed with wife, she is  interested to take the patient home with home nurse instead of SNF, despite the recommendations of our staff and medical team. He was discharged to continue PO treatment at home and encouraged to eat and drink (dysphagia 3 diet) and avoide dehydration. He likely will continue to progress and we encouraged involvement of home hospice.  Right upper extremity swelling: likely due to IV infiltration.  Ultrasound was negative for DVT. Significantly improved after IV removal.    Chronic Bilateral Osteomyelitis  Was stable on this admission. Continued treatment with wet-to-dry dressings with silver sulfadiazineas well as doxycycline and keflex.   HFpEF, Stable  No signs or symptoms of decompensation during this admission.   Discharge Vitals:   BP (!) 99/53 (BP Location: Left Arm)   Pulse 80   Temp 98.9 F (37.2 C) (Oral)   Resp 20   Ht 5\' 11"  (1.803 m)   Wt 65.9 kg   SpO2 95%   BMI 20.26 kg/m   Pertinent Labs, Studies, and Procedures:  BMP Latest Ref Rng & Units 09/25/2018 09/24/2018 09/23/2018  Glucose 70 - 99 mg/dL 80 99 78  BUN 8 - 23 mg/dL 16(X) 09(U) 04(V)  Creatinine 0.61 - 1.24 mg/dL 4.09(W) 1.19(J) 4.78(G)  BUN/Creat Ratio 10 - 24 - - -  Sodium 135 - 145 mmol/L 144 146(H) 146(H)  Potassium 3.5 - 5.1 mmol/L 3.6 3.5 3.7  Chloride 98 - 111 mmol/L 121(H) 123(H) 124(H)  CO2 22 - 32 mmol/L 23 21(L) 20(L)  Calcium 8.9 - 10.3 mg/dL 9.5 9.4 9.4    Discharge Instructions: Discharge Instructions    Amb Referral to Palliative Care   Complete by:  As directed    Call MD for:  extreme fatigue   Complete by:  As directed    Call MD for:  persistant dizziness or light-headedness   Complete by:  As directed    Diet - low sodium heart healthy   Complete by:  As directed    Discharge instructions   Complete by:  As directed    Thanks for letting us taking care of you at [facility].  He was seen due to generalized weakness and problem swallowing.  We treated you with IV fluid,  also for yeast infection in your mouth.  Please continue your antibiotic, and antifungal medicine as instructed.  Encourage you to eat and drink enough to avoid dehydration and weakness.  We provide you home health to assist your health care.  please follow-up in our clinic in a week as a scheduled for you.  If having any question or concern you can call us at [phone].  As always if severe symptoms or if not improved, we can seek medical attention at the emergency.  Thanks, Dr. Maryla Morrow   Increase activity slowly   Complete by:  As directed       Signed: Shary Decamp, MD Pager: [phone]   Internal Medicine Attending Note:  I saw and examined the patient on the day of discharge. I reviewed and agree with the discharge summary written by the house staff.  Jessy Oto, M.D., Ph.D.

## 2018-09-25 NOTE — Progress Notes (Signed)
   Subjective: Patient is doing well this AM. He is anxious to leave. Able to take all his oral medications today. Did not eat much for breakfast. He is continuing to have right arm pain but it is improved. His wife feels that he is more alert today. Discussed the plan for discharge home and medication changes. Encouraged PO intake when going home and the importance of that. All questions and concerns addressed.   Objective: Vital signs in last 24 hours: Vitals:   09/24/18 0333 09/24/18 0802 09/24/18 1325 09/25/18 0602  BP: (!) 141/82 127/68 111/64 (!) 99/53  Pulse: 96  89 80  Resp: 19   20  Temp: 98.4 F (36.9 C)   98.9 F (37.2 C)  TempSrc: Axillary   Oral  SpO2: 96%  94% 95%  Weight: 65.9 kg   65.9 kg  Height:       General: Elderly thin male in no acute distress Pulm: Good air movement with no wheezing or crackles  CV: RRR, no murmurs, no rubs  Extremities: No LE edema, bandages clean and dry on lower extremities Skin: Warm and dry   Assessment/Plan:  Vincent Miranda is a 84 y.o male with chronic bilateral osteomyelitis on chronic suppressive antibiotic therapy, CKD Stage IV, CVA/PVD, and HTN who presented to the ED on 09/17/18 with encephalopathy and decreased PO intake subsequently found to have acute on chronic kidney failure and hypernatremia. On PE he was found to have thrush which was felt to be contributing to his decreased PO intake. He has been treated with IV fluconazole and hydrated with D5W.   GOC - Palliative evaluated the patient on 11/2 and discussed GOC with the patient and his wife. We appreciate palliatives help in this case. Currently the patient and his wife would like to continue with current care and remain a full code. Plan to discharge home with home health and outpatient palliative evaluation.   Severe Malnutrition in Context of Chronic Illness  - Nutrition consult recommending daily multivitamin and magic cups TID with meals  - Continuing goals of  care discussion and encouraging PO intake  Esophageal dysphagia multifactorial, due to candidal esophagitis and possibly functional limitations from prior CVA - Discomfort with swallowing has resolved but patient continues to have poor appetite and PO intake  - Continuing IV Fluconazole for 10 days of treatment (Day #7), transition to PO on discharge  - Dysphagia diet #3  Chronic Bilateral Osteomyelitis  - Continue wet-to-dry dressings with silver sulfadiazine  - Continue doxycycline and keflex   HFpEF, Stable  - No signs or symptoms of decompensation   Hypernatremia. Resolved.  Dispo: Anticipated discharge today. Will need ambulance transportation home.   Levora Dredge, MD 09/25/2018, 11:40 AM

## 2018-09-26 DIAGNOSIS — Z7982 Long term (current) use of aspirin: Secondary | ICD-10-CM | POA: Diagnosis not present

## 2018-09-26 DIAGNOSIS — Z792 Long term (current) use of antibiotics: Secondary | ICD-10-CM | POA: Diagnosis not present

## 2018-09-26 DIAGNOSIS — I739 Peripheral vascular disease, unspecified: Secondary | ICD-10-CM | POA: Diagnosis not present

## 2018-09-26 DIAGNOSIS — Z9181 History of falling: Secondary | ICD-10-CM | POA: Diagnosis not present

## 2018-09-26 DIAGNOSIS — Z8673 Personal history of transient ischemic attack (TIA), and cerebral infarction without residual deficits: Secondary | ICD-10-CM | POA: Diagnosis not present

## 2018-09-26 DIAGNOSIS — M86671 Other chronic osteomyelitis, right ankle and foot: Secondary | ICD-10-CM | POA: Diagnosis not present

## 2018-09-26 DIAGNOSIS — M86672 Other chronic osteomyelitis, left ankle and foot: Secondary | ICD-10-CM | POA: Diagnosis not present

## 2018-09-26 DIAGNOSIS — A009 Cholera, unspecified: Secondary | ICD-10-CM | POA: Diagnosis not present

## 2018-09-26 DIAGNOSIS — I5030 Unspecified diastolic (congestive) heart failure: Secondary | ICD-10-CM | POA: Diagnosis not present

## 2018-09-26 DIAGNOSIS — R1310 Dysphagia, unspecified: Secondary | ICD-10-CM | POA: Diagnosis not present

## 2018-09-26 DIAGNOSIS — N184 Chronic kidney disease, stage 4 (severe): Secondary | ICD-10-CM | POA: Diagnosis not present

## 2018-09-26 DIAGNOSIS — E46 Unspecified protein-calorie malnutrition: Secondary | ICD-10-CM | POA: Diagnosis not present

## 2018-09-26 DIAGNOSIS — B370 Candidal stomatitis: Secondary | ICD-10-CM | POA: Diagnosis not present

## 2018-09-26 DIAGNOSIS — R1312 Dysphagia, oropharyngeal phase: Secondary | ICD-10-CM | POA: Diagnosis not present

## 2018-09-26 DIAGNOSIS — R627 Adult failure to thrive: Secondary | ICD-10-CM | POA: Diagnosis not present

## 2018-09-26 DIAGNOSIS — L89012 Pressure ulcer of right elbow, stage 2: Secondary | ICD-10-CM | POA: Diagnosis not present

## 2018-09-26 DIAGNOSIS — I130 Hypertensive heart and chronic kidney disease with heart failure and stage 1 through stage 4 chronic kidney disease, or unspecified chronic kidney disease: Secondary | ICD-10-CM | POA: Diagnosis not present

## 2018-09-27 DIAGNOSIS — R627 Adult failure to thrive: Secondary | ICD-10-CM

## 2018-09-27 DIAGNOSIS — B370 Candidal stomatitis: Secondary | ICD-10-CM | POA: Diagnosis not present

## 2018-09-27 DIAGNOSIS — M86671 Other chronic osteomyelitis, right ankle and foot: Secondary | ICD-10-CM | POA: Diagnosis not present

## 2018-09-27 DIAGNOSIS — E46 Unspecified protein-calorie malnutrition: Secondary | ICD-10-CM | POA: Diagnosis not present

## 2018-09-27 DIAGNOSIS — L89012 Pressure ulcer of right elbow, stage 2: Secondary | ICD-10-CM | POA: Diagnosis not present

## 2018-09-27 DIAGNOSIS — M86672 Other chronic osteomyelitis, left ankle and foot: Secondary | ICD-10-CM | POA: Diagnosis not present

## 2018-09-30 ENCOUNTER — Telehealth: Payer: Self-pay | Admitting: *Deleted

## 2018-09-30 NOTE — Telephone Encounter (Signed)
Pt's caregiver has refused HH OT per therapist don Laural Benes

## 2018-09-30 NOTE — Telephone Encounter (Signed)
Drenda Freeze from hospice and pallative care calls and states they are making contact w/ pt and spouse today, will call after initial meeting for VO, is pcp agreeable with this, gave okay, do you agree?

## 2018-10-01 NOTE — Telephone Encounter (Signed)
Yes, I agree. Thanks!

## 2018-10-03 ENCOUNTER — Ambulatory Visit: Payer: Medicare Other

## 2018-10-03 DIAGNOSIS — M86672 Other chronic osteomyelitis, left ankle and foot: Secondary | ICD-10-CM | POA: Diagnosis not present

## 2018-10-03 DIAGNOSIS — E46 Unspecified protein-calorie malnutrition: Secondary | ICD-10-CM | POA: Diagnosis not present

## 2018-10-03 DIAGNOSIS — M86671 Other chronic osteomyelitis, right ankle and foot: Secondary | ICD-10-CM | POA: Diagnosis not present

## 2018-10-03 DIAGNOSIS — B370 Candidal stomatitis: Secondary | ICD-10-CM | POA: Diagnosis not present

## 2018-10-03 DIAGNOSIS — R627 Adult failure to thrive: Secondary | ICD-10-CM | POA: Diagnosis not present

## 2018-10-03 DIAGNOSIS — L89012 Pressure ulcer of right elbow, stage 2: Secondary | ICD-10-CM | POA: Diagnosis not present

## 2018-10-04 DIAGNOSIS — L89012 Pressure ulcer of right elbow, stage 2: Secondary | ICD-10-CM | POA: Diagnosis not present

## 2018-10-04 DIAGNOSIS — E46 Unspecified protein-calorie malnutrition: Secondary | ICD-10-CM | POA: Diagnosis not present

## 2018-10-04 DIAGNOSIS — B370 Candidal stomatitis: Secondary | ICD-10-CM | POA: Diagnosis not present

## 2018-10-04 DIAGNOSIS — M86671 Other chronic osteomyelitis, right ankle and foot: Secondary | ICD-10-CM | POA: Diagnosis not present

## 2018-10-04 DIAGNOSIS — M86672 Other chronic osteomyelitis, left ankle and foot: Secondary | ICD-10-CM | POA: Diagnosis not present

## 2018-10-04 DIAGNOSIS — R627 Adult failure to thrive: Secondary | ICD-10-CM | POA: Diagnosis not present

## 2018-10-10 ENCOUNTER — Telehealth: Payer: Self-pay | Admitting: *Deleted

## 2018-10-10 DIAGNOSIS — R627 Adult failure to thrive: Secondary | ICD-10-CM | POA: Diagnosis not present

## 2018-10-10 DIAGNOSIS — B370 Candidal stomatitis: Secondary | ICD-10-CM | POA: Diagnosis not present

## 2018-10-10 DIAGNOSIS — M86672 Other chronic osteomyelitis, left ankle and foot: Secondary | ICD-10-CM | POA: Diagnosis not present

## 2018-10-10 DIAGNOSIS — E46 Unspecified protein-calorie malnutrition: Secondary | ICD-10-CM | POA: Diagnosis not present

## 2018-10-10 DIAGNOSIS — L89012 Pressure ulcer of right elbow, stage 2: Secondary | ICD-10-CM | POA: Diagnosis not present

## 2018-10-10 DIAGNOSIS — M86671 Other chronic osteomyelitis, right ankle and foot: Secondary | ICD-10-CM | POA: Diagnosis not present

## 2018-10-10 NOTE — Telephone Encounter (Addendum)
[name], RN with [facility] called in. States patient eats only 3 bites of food (applesause, soft peaches, sweet potatoes or watermelon) per day with sips of water. Takes no meds at all. Wife does not want something to stimulate his appetite as this has made him sick in past. Patient is too weak to stand so they are unable to obtain weight. Today's VS: BP 128/78 P 76 and regular, R 18, afebrile. Patient has poor perfusion so unable to obtain SpO2. Is responsive and oriented to person. Patient continues to be full code. Patient's wife refuses to consider hospice or palliative care. [facility] SW has been to patient's home twice and will go back tomorrow. Angelique BlonderDenise states she has noticed a big change since last week; patient appears much more emaciated. Angelique BlonderDenise states that at this point patient is beyond [facility] nursing. Angelique BlonderDenise would like a call back to discuss next steps. [phone]. Ok to call after hours. Kinnie FeilL. Herbert Aguinaldo, RN, BSN

## 2018-10-11 ENCOUNTER — Other Ambulatory Visit: Payer: Self-pay | Admitting: Internal Medicine

## 2018-10-11 ENCOUNTER — Telehealth: Payer: Self-pay

## 2018-10-11 NOTE — Telephone Encounter (Signed)
I agree with you Dr. Petra KubaVogel. Would encourage his wife to keep him as comfortable as possible at home. Likely is approaching the end of his life. She can bring him to the hospital if she is unable to manage his symptoms.

## 2018-10-11 NOTE — Telephone Encounter (Signed)
Vincent Miranda (Child psychotherapistsocial worker) with [facility] requesting to speak with a nurse about pt. Please call back.

## 2018-10-11 NOTE — Telephone Encounter (Signed)
I spoke with Vincent Blonderenise, RN with [facility], and [facility]. Per her report, [name] looks very emaciated, isn't taking medications due to difficulty swallowing, has very little po intake. His vital signs are stable and chronic lower extremity wounds do not look infected. The pressure ulcer on his left elbow is healing.  She does not see any reason to send him to the ED, but states that [facility]. Patient would benefit from Hospice but wife is declining hospice services.   I also spoke with [name]'s wife and she is fine with discontinuing HH services. She states, "All they are doing is checking his vital signs and his blood pressure and heart rate are fine." She knows that her husband could die soon and is okay with that. He hasn't been able to take any of his medications because he can't swallow them. His po intake is very low. I offered hospice services and asked if there was anything else we could do to help her and she stated, "I am tired of people bringing up hospice services. I've said at least 500 times that I don't want hospice. I just need to try and get Molly MaduroRobert to eat. If he dies in two weeks, then that's fine. It's a challenge to get him to eat and if I can get him to eat two bites of applesauce, I consider that an accomplishment." I told her that I just wanted to make sure she knew that Hospice services are available if she would like extra help or would like an extra set of eyes on [name]. She was very appreciative of that. I told her to let us know if anything changes or if there's anything we can do to help her during this difficult time.    Vincent KaufmannMarie [employer], MD 10/11/18 1:41PM

## 2018-10-11 NOTE — Telephone Encounter (Signed)
I spoke with [name], RN with [facility], and gave [facility]. I also spoke with Mr. Volden's wife and she is fine with discontinuing HH services. She states, "All they are doing is checking his vital signs and his blood pressure and heart rate are fine." She knows that her husband could die soon and is okay with that. He hasn't been able to take any of his medications because he can't swallow them. His po intake is very low. I offered hospice services and asked if there was anything else we could do to help her and she stated, "I am tired of people bringing up hospice services. I've said at least 500 times that I don't want hospice. I just need to try and get Vincent Miranda to eat. If he dies in two weeks, then that's fine. It's a challenge to get him to eat and if I can get him to eat two bites of applesauce, I consider that an accomplishment." I told her that I just wanted to make sure she knew that Hospice services are available if she would like extra help or would like an extra set of eyes on Vincent Miranda. She was very appreciative of that. I told her to let us know if anything changes or if there's anything we can do to help her during this difficult time.    If anyone has any other suggestions for handling this situation let me know!  Thanks,  Dr. Petra KubaVogel

## 2018-10-11 NOTE — Telephone Encounter (Signed)
Rtc, lm for rtc 

## 2018-10-11 NOTE — Telephone Encounter (Signed)
Thanks

## 2018-10-14 ENCOUNTER — Telehealth: Payer: Self-pay | Admitting: *Deleted

## 2018-10-14 DIAGNOSIS — R4020 Unspecified coma: Secondary | ICD-10-CM | POA: Diagnosis not present

## 2018-10-14 DIAGNOSIS — E860 Dehydration: Secondary | ICD-10-CM | POA: Diagnosis not present

## 2018-10-14 DIAGNOSIS — R404 Transient alteration of awareness: Secondary | ICD-10-CM | POA: Diagnosis not present

## 2018-10-14 DIAGNOSIS — I499 Cardiac arrhythmia, unspecified: Secondary | ICD-10-CM | POA: Diagnosis not present

## 2018-10-14 NOTE — Telephone Encounter (Signed)
Thank you :)

## 2018-10-14 NOTE — Telephone Encounter (Signed)
Received call from [employer], found Vincent Miranda apneic, pulseless, cold in bed and pronounced dead at 11:38am.  I agreed we would sign death certificate.

## 2018-10-14 NOTE — Telephone Encounter (Signed)
Spoke with Okey RegalCarol, reports Molly MaduroRobert passed at around 1030am.  She is hesitant to call 911, has called her sons to come to see him before he is taken to morgue.  She does not have a signed DNR form, I confirmed that she would not want an attempt at ressustation by EMS.  I then asked permission to have a health care provider come out to help me confirm death- she agreed, I called the non emergent line [facility]- they will send EMS crew out can report back to me.

## 2018-10-14 NOTE — Telephone Encounter (Signed)
Pt's wife calls this am, states her husband died and wants dr Mikey Bussinghoffman to "process the paperwork" informed her to call 911 and the county [facility], she refused and hung up. Called dr Mikey Bussinghoffman and he will call pt's wife

## 2018-10-20 DIAGNOSIS — 419620001 Death: Secondary | SNOMED CT | POA: Diagnosis not present

## 2018-10-20 DEATH — deceased

## 2018-10-24 NOTE — Telephone Encounter (Signed)
Reviewed meds 

## 2019-06-30 ENCOUNTER — Other Ambulatory Visit: Payer: Self-pay | Admitting: Internal Medicine

## 2022-09-21 NOTE — Telephone Encounter (Signed)
ERROR
